# Patient Record
Sex: Female | Born: 1935 | Race: White | Hispanic: No | Smoking: Never smoker
Health system: Southern US, Community
[De-identification: ages and names within clinical notes are randomized; demographics above are authoritative.]

## PROBLEM LIST (undated history)

## (undated) DIAGNOSIS — I509 Heart failure, unspecified: Secondary | ICD-10-CM

## (undated) DIAGNOSIS — G629 Polyneuropathy, unspecified: Secondary | ICD-10-CM

## (undated) DIAGNOSIS — E079 Disorder of thyroid, unspecified: Secondary | ICD-10-CM

## (undated) DIAGNOSIS — E039 Hypothyroidism, unspecified: Secondary | ICD-10-CM

## (undated) DIAGNOSIS — J449 Chronic obstructive pulmonary disease, unspecified: Secondary | ICD-10-CM

## (undated) DIAGNOSIS — N179 Acute kidney failure, unspecified: Secondary | ICD-10-CM

## (undated) DIAGNOSIS — R0600 Dyspnea, unspecified: Secondary | ICD-10-CM

## (undated) DIAGNOSIS — Z95810 Presence of automatic (implantable) cardiac defibrillator: Secondary | ICD-10-CM

## (undated) DIAGNOSIS — I429 Cardiomyopathy, unspecified: Secondary | ICD-10-CM

## (undated) DIAGNOSIS — C801 Malignant (primary) neoplasm, unspecified: Secondary | ICD-10-CM

## (undated) DIAGNOSIS — I10 Essential (primary) hypertension: Secondary | ICD-10-CM

## (undated) HISTORY — DX: Essential (primary) hypertension: I10

## (undated) HISTORY — PX: CHOLECYSTECTOMY: SHX55

## (undated) HISTORY — DX: Presence of automatic (implantable) cardiac defibrillator: Z95.810

## (undated) HISTORY — DX: Cardiomyopathy, unspecified: I42.9

## (undated) HISTORY — PX: APPENDECTOMY: SHX54

## (undated) HISTORY — DX: Polyneuropathy, unspecified: G62.9

## (undated) HISTORY — DX: Heart failure, unspecified: I50.9

---

## 1990-09-27 HISTORY — PX: CARDIAC CATHETERIZATION: SHX172

## 1997-10-08 ENCOUNTER — Ambulatory Visit (HOSPITAL_COMMUNITY): Admission: RE | Admit: 1997-10-08 | Discharge: 1997-10-08 | Payer: Self-pay | Admitting: Cardiology

## 1998-12-05 ENCOUNTER — Other Ambulatory Visit: Admission: RE | Admit: 1998-12-05 | Discharge: 1998-12-05 | Payer: Self-pay | Admitting: Family Medicine

## 1998-12-09 ENCOUNTER — Encounter: Payer: Self-pay | Admitting: Family Medicine

## 1998-12-09 ENCOUNTER — Ambulatory Visit (HOSPITAL_COMMUNITY): Admission: RE | Admit: 1998-12-09 | Discharge: 1998-12-09 | Payer: Self-pay | Admitting: Family Medicine

## 1998-12-24 ENCOUNTER — Ambulatory Visit: Admission: RE | Admit: 1998-12-24 | Discharge: 1998-12-24 | Payer: Self-pay | Admitting: Gynecologic Oncology

## 1998-12-26 ENCOUNTER — Encounter: Payer: Self-pay | Admitting: Gynecology

## 1998-12-28 HISTORY — PX: ABDOMINAL HYSTERECTOMY: SHX81

## 1998-12-30 ENCOUNTER — Inpatient Hospital Stay (HOSPITAL_COMMUNITY): Admission: RE | Admit: 1998-12-30 | Discharge: 1999-01-07 | Payer: Self-pay | Admitting: Gynecology

## 1998-12-30 ENCOUNTER — Encounter (INDEPENDENT_AMBULATORY_CARE_PROVIDER_SITE_OTHER): Payer: Self-pay

## 1998-12-30 ENCOUNTER — Encounter (INDEPENDENT_AMBULATORY_CARE_PROVIDER_SITE_OTHER): Payer: Self-pay | Admitting: Specialist

## 1999-01-02 ENCOUNTER — Encounter: Payer: Self-pay | Admitting: Obstetrics & Gynecology

## 1999-01-20 ENCOUNTER — Encounter (INDEPENDENT_AMBULATORY_CARE_PROVIDER_SITE_OTHER): Payer: Self-pay

## 1999-01-20 ENCOUNTER — Ambulatory Visit: Admission: RE | Admit: 1999-01-20 | Discharge: 1999-01-20 | Payer: Self-pay | Admitting: Gynecology

## 1999-01-27 ENCOUNTER — Encounter (INDEPENDENT_AMBULATORY_CARE_PROVIDER_SITE_OTHER): Payer: Self-pay

## 1999-01-27 ENCOUNTER — Ambulatory Visit: Admission: RE | Admit: 1999-01-27 | Discharge: 1999-01-27 | Payer: Self-pay | Admitting: Gynecology

## 1999-02-04 ENCOUNTER — Encounter: Payer: Self-pay | Admitting: Oncology

## 1999-02-04 ENCOUNTER — Ambulatory Visit (HOSPITAL_COMMUNITY): Admission: RE | Admit: 1999-02-04 | Discharge: 1999-02-04 | Payer: Self-pay | Admitting: Oncology

## 1999-02-10 ENCOUNTER — Encounter: Payer: Self-pay | Admitting: Oncology

## 1999-02-10 ENCOUNTER — Encounter: Admission: RE | Admit: 1999-02-10 | Discharge: 1999-02-10 | Payer: Self-pay | Admitting: Oncology

## 1999-02-18 ENCOUNTER — Ambulatory Visit: Admission: RE | Admit: 1999-02-18 | Discharge: 1999-02-18 | Payer: Self-pay | Admitting: Gynecology

## 1999-04-14 ENCOUNTER — Ambulatory Visit: Admission: RE | Admit: 1999-04-14 | Discharge: 1999-04-14 | Payer: Self-pay | Admitting: Gynecology

## 1999-06-16 ENCOUNTER — Ambulatory Visit (HOSPITAL_COMMUNITY): Admission: RE | Admit: 1999-06-16 | Discharge: 1999-06-16 | Payer: Self-pay | Admitting: Oncology

## 1999-06-16 ENCOUNTER — Encounter: Payer: Self-pay | Admitting: Oncology

## 1999-08-12 ENCOUNTER — Ambulatory Visit: Admission: RE | Admit: 1999-08-12 | Discharge: 1999-08-12 | Payer: Self-pay | Admitting: Gynecology

## 2000-02-16 ENCOUNTER — Ambulatory Visit: Admission: RE | Admit: 2000-02-16 | Discharge: 2000-02-16 | Payer: Self-pay | Admitting: Gynecology

## 2000-07-27 ENCOUNTER — Ambulatory Visit: Admission: RE | Admit: 2000-07-27 | Discharge: 2000-07-27 | Payer: Self-pay | Admitting: Gynecology

## 2001-02-01 ENCOUNTER — Ambulatory Visit: Admission: RE | Admit: 2001-02-01 | Discharge: 2001-02-01 | Payer: Self-pay | Admitting: Gynecology

## 2001-08-01 ENCOUNTER — Ambulatory Visit: Admission: RE | Admit: 2001-08-01 | Discharge: 2001-08-01 | Payer: Self-pay | Admitting: Gynecology

## 2001-11-16 ENCOUNTER — Encounter: Payer: Self-pay | Admitting: Oncology

## 2001-11-16 ENCOUNTER — Ambulatory Visit (HOSPITAL_COMMUNITY): Admission: RE | Admit: 2001-11-16 | Discharge: 2001-11-16 | Payer: Self-pay | Admitting: Oncology

## 2002-02-14 ENCOUNTER — Ambulatory Visit: Admission: RE | Admit: 2002-02-14 | Discharge: 2002-02-14 | Payer: Self-pay | Admitting: Gynecology

## 2002-03-08 ENCOUNTER — Encounter: Payer: Self-pay | Admitting: Gynecology

## 2002-03-08 ENCOUNTER — Ambulatory Visit (HOSPITAL_COMMUNITY): Admission: RE | Admit: 2002-03-08 | Discharge: 2002-03-08 | Payer: Self-pay | Admitting: Gynecology

## 2002-05-03 ENCOUNTER — Ambulatory Visit (HOSPITAL_COMMUNITY): Admission: RE | Admit: 2002-05-03 | Discharge: 2002-05-03 | Payer: Self-pay | Admitting: Oncology

## 2002-05-03 ENCOUNTER — Encounter: Payer: Self-pay | Admitting: Oncology

## 2003-01-30 ENCOUNTER — Ambulatory Visit: Admission: RE | Admit: 2003-01-30 | Discharge: 2003-01-30 | Payer: Self-pay | Admitting: Gynecology

## 2003-04-22 ENCOUNTER — Encounter: Admission: RE | Admit: 2003-04-22 | Discharge: 2003-04-22 | Payer: Self-pay | Admitting: Orthopaedic Surgery

## 2003-04-23 ENCOUNTER — Ambulatory Visit (HOSPITAL_COMMUNITY): Admission: RE | Admit: 2003-04-23 | Discharge: 2003-04-23 | Payer: Self-pay | Admitting: Orthopaedic Surgery

## 2003-04-23 ENCOUNTER — Ambulatory Visit (HOSPITAL_BASED_OUTPATIENT_CLINIC_OR_DEPARTMENT_OTHER): Admission: RE | Admit: 2003-04-23 | Discharge: 2003-04-23 | Payer: Self-pay | Admitting: Orthopaedic Surgery

## 2004-01-29 ENCOUNTER — Ambulatory Visit: Admission: RE | Admit: 2004-01-29 | Discharge: 2004-01-29 | Payer: Self-pay | Admitting: Gynecology

## 2005-06-09 ENCOUNTER — Ambulatory Visit (HOSPITAL_COMMUNITY): Admission: RE | Admit: 2005-06-09 | Discharge: 2005-06-09 | Payer: Self-pay | Admitting: Gynecology

## 2005-06-11 ENCOUNTER — Ambulatory Visit: Admission: RE | Admit: 2005-06-11 | Discharge: 2005-06-11 | Payer: Self-pay | Admitting: Gynecology

## 2007-03-09 ENCOUNTER — Emergency Department (HOSPITAL_COMMUNITY): Admission: EM | Admit: 2007-03-09 | Discharge: 2007-03-09 | Payer: Self-pay | Admitting: Emergency Medicine

## 2007-06-05 HISTORY — PX: NM MYOCAR PERF WALL MOTION: HXRAD629

## 2007-06-20 ENCOUNTER — Ambulatory Visit: Payer: Self-pay | Admitting: Internal Medicine

## 2007-06-20 LAB — CONVERTED CEMR LAB
Basophils Absolute: 0.1 10*3/uL (ref 0.0–0.1)
Basophils Relative: 1.4 % — ABNORMAL HIGH (ref 0.0–1.0)
CO2: 33 meq/L — ABNORMAL HIGH (ref 19–32)
Calcium: 10.5 mg/dL (ref 8.4–10.5)
Chloride: 96 meq/L (ref 96–112)
Creatinine, Ser: 0.8 mg/dL (ref 0.4–1.2)
Eosinophils Relative: 5 % (ref 0.0–5.0)
Glucose, Bld: 121 mg/dL — ABNORMAL HIGH (ref 70–99)
Hemoglobin: 13.7 g/dL (ref 12.0–15.0)
INR: 0.9 (ref 0.8–1.0)
Lymphocytes Relative: 29.7 % (ref 12.0–46.0)
Monocytes Relative: 8.4 % (ref 3.0–11.0)
Neutro Abs: 4.2 10*3/uL (ref 1.4–7.7)
Neutrophils Relative %: 55.5 % (ref 43.0–77.0)
Prothrombin Time: 11.5 s (ref 10.9–13.3)
RBC: 4.33 M/uL (ref 3.87–5.11)
WBC: 7.6 10*3/uL (ref 4.5–10.5)
aPTT: 30.4 s — ABNORMAL HIGH (ref 21.7–29.8)

## 2007-06-27 ENCOUNTER — Ambulatory Visit: Payer: Self-pay | Admitting: Internal Medicine

## 2007-06-27 ENCOUNTER — Inpatient Hospital Stay (HOSPITAL_COMMUNITY): Admission: AD | Admit: 2007-06-27 | Discharge: 2007-06-29 | Payer: Self-pay | Admitting: Internal Medicine

## 2007-06-27 DIAGNOSIS — Z95810 Presence of automatic (implantable) cardiac defibrillator: Secondary | ICD-10-CM

## 2007-06-27 HISTORY — PX: CARDIAC DEFIBRILLATOR PLACEMENT: SHX171

## 2007-06-27 HISTORY — DX: Presence of automatic (implantable) cardiac defibrillator: Z95.810

## 2007-07-13 ENCOUNTER — Ambulatory Visit: Payer: Self-pay | Admitting: Internal Medicine

## 2007-07-13 ENCOUNTER — Ambulatory Visit: Payer: Self-pay

## 2007-07-27 ENCOUNTER — Ambulatory Visit: Payer: Self-pay | Admitting: Internal Medicine

## 2007-08-17 ENCOUNTER — Ambulatory Visit: Payer: Self-pay

## 2007-08-17 ENCOUNTER — Encounter: Payer: Self-pay | Admitting: Internal Medicine

## 2007-08-29 ENCOUNTER — Ambulatory Visit: Payer: Self-pay | Admitting: Internal Medicine

## 2007-08-29 LAB — CONVERTED CEMR LAB
BUN: 21 mg/dL (ref 6–23)
Calcium: 10.1 mg/dL (ref 8.4–10.5)
Eosinophils Absolute: 0.3 10*3/uL (ref 0.0–0.7)
Eosinophils Relative: 4.9 % (ref 0.0–5.0)
GFR calc Af Amer: 78 mL/min
GFR calc non Af Amer: 65 mL/min
Glucose, Bld: 127 mg/dL — ABNORMAL HIGH (ref 70–99)
HCT: 38.3 % (ref 36.0–46.0)
Hemoglobin: 13.3 g/dL (ref 12.0–15.0)
MCV: 95.8 fL (ref 78.0–100.0)
Monocytes Absolute: 0.5 10*3/uL (ref 0.1–1.0)
Monocytes Relative: 8.7 % (ref 3.0–12.0)
Neutro Abs: 3.5 10*3/uL (ref 1.4–7.7)
Platelets: 185 10*3/uL (ref 150–400)
RDW: 11.6 % (ref 11.5–14.6)
Sodium: 138 meq/L (ref 135–145)

## 2007-10-27 ENCOUNTER — Ambulatory Visit: Payer: Self-pay | Admitting: Internal Medicine

## 2008-01-29 ENCOUNTER — Ambulatory Visit: Payer: Self-pay

## 2008-08-13 ENCOUNTER — Encounter: Payer: Self-pay | Admitting: Internal Medicine

## 2009-02-12 ENCOUNTER — Encounter (INDEPENDENT_AMBULATORY_CARE_PROVIDER_SITE_OTHER): Payer: Self-pay | Admitting: *Deleted

## 2009-04-15 ENCOUNTER — Encounter: Admission: RE | Admit: 2009-04-15 | Discharge: 2009-04-15 | Payer: Self-pay | Admitting: Specialist

## 2009-06-16 ENCOUNTER — Inpatient Hospital Stay (HOSPITAL_COMMUNITY): Admission: RE | Admit: 2009-06-16 | Discharge: 2009-06-18 | Payer: Self-pay | Admitting: Specialist

## 2009-12-26 ENCOUNTER — Encounter (INDEPENDENT_AMBULATORY_CARE_PROVIDER_SITE_OTHER): Payer: Self-pay | Admitting: *Deleted

## 2010-01-09 ENCOUNTER — Encounter: Payer: Self-pay | Admitting: Internal Medicine

## 2010-01-12 ENCOUNTER — Telehealth (INDEPENDENT_AMBULATORY_CARE_PROVIDER_SITE_OTHER): Payer: Self-pay | Admitting: *Deleted

## 2010-04-28 NOTE — Cardiovascular Report (Signed)
Summary: Certified Letter Signed - Other (f/u w. Dr. Clarene Duke)  Certified Letter Signed - Other (f/u w. Dr. Clarene Duke)   Imported By: Debby Freiberg 02/03/2010 13:10:09  _____________________________________________________________________  External Attachment:    Type:   Image     Comment:   External Document

## 2010-04-28 NOTE — Letter (Signed)
Summary: Device-Delinquent Check  [facility], Main Office  [address]   [place], [place] [address]   Phone: [phone]  Fax: [phone]     December 26, 2009 MRN: [id number]   [facility] [address] [place], [place]  [address]   Dear Ms. Brunn,  According to our records, you have not had your implanted device checked in the recommended period of time.  We are unable to determine appropriate device function without checking your device on a regular basis.  Please call our office to schedule an appointment  with Dr Graciela Husbands, as soon as possible.  If you are having your device checked by another physician, please call us so that we may update our records.  Thank you,  Letta Moynahan, EMT  December 26, 2009 2:45 PM  [facility] certified

## 2010-04-28 NOTE — Progress Notes (Signed)
  Phone Note Call from Patient   Summary of Call: per pt---pt is followed w/dr Little for device. [name]  January 12, 2010 12:33 PM

## 2010-06-22 LAB — BASIC METABOLIC PANEL
BUN: 15 mg/dL (ref 6–23)
CO2: 28 mEq/L (ref 19–32)
Calcium: 9.6 mg/dL (ref 8.4–10.5)
Chloride: 95 mEq/L — ABNORMAL LOW (ref 96–112)
Creatinine, Ser: 1.01 mg/dL (ref 0.4–1.2)
GFR calc non Af Amer: 53 mL/min — ABNORMAL LOW (ref >60)
GFR calc non Af Amer: >60 mL/min (ref >60)
Glucose, Bld: 144 mg/dL — ABNORMAL HIGH (ref 70–99)
Glucose, Bld: 162 mg/dL — ABNORMAL HIGH (ref 70–99)
Potassium: 3.6 mEq/L (ref 3.5–5.1)
Sodium: 131 mEq/L — ABNORMAL LOW (ref 135–145)

## 2010-06-22 LAB — CBC
Hemoglobin: 13.1 g/dL (ref 12.0–15.0)
Platelets: DECREASED 10*3/uL (ref 150–400)
RBC: 3.97 MIL/uL (ref 3.87–5.11)
RDW: 13.2 % (ref 11.5–15.5)
RDW: 13.4 % (ref 11.5–15.5)
WBC: 5.8 10*3/uL (ref 4.0–10.5)
WBC: 7.2 10*3/uL (ref 4.0–10.5)

## 2010-08-11 NOTE — Letter (Signed)
October 27, 2007    Thereasa Solo. Little, M.D.  [address]  [place], [place] [address]   RE:  [name], [name]  MRN:  [id number]  /  DOB:  Dec 24, 1928   Dear Mindi Junker:   [name] came in today with her husband.  She continues to complain  of fatigue.  Interestingly, her AV optimization echo was associated with  normal left ventricular function.   On examination today, her blood pressure was 106/60, her pulse was 72,  her lungs were clear, her heart sounds were regular.  Extremities were  without edema.  The protrusion at her device pocket is the wing on her  Medtronic lead anchoring sleeve.  The tissue moves across the wing  easily without retraction.   The movement of the tissue across the wing tip makes it unlikely that  that should erode.  If it were to become attached or tethered that  likelihood goes up strikingly.  It is, however, concerning and it is  potentially susceptible to trauma.  We discussed reviewing it again in a  couple of months and thinking about making a small 1-inch incision above  there to cut the wing off.   I did relay to her the results of her echo, and I have asked them to fax  you a copy of that.   It was a pleasant surprise to [name] all.    Sincerely,      Duke Salvia, MD, [facility]  Electronically Signed    SCK/MedQ  DD: 10/27/2007  DT: 10/28/2007  Job #: [id number]

## 2010-08-11 NOTE — Letter (Signed)
June 20, 2007    Thereasa Solo. Little, M.D.  [address]  [address]  [place], [place] [address]   RE:  Katie, [name]  MRN:  [id number]  /  DOB:  [birth year]   Dear [name]:   It was a pleasure to see [name] today at your request for  consideration of cardiac resynchronization therapy.   As you know, she is a 77 year old with a long-standing history of  diagnosed nonischemic cardiomyopathy who has a recent Myoview scan, the  results of which I presume are normal as related to perfusion for which  I do not yet have the information.  The Myoview scan was without  ischemia with an ejection fraction of 27%.  She has had persistent  documentation of left ventricular dysfunction in the 20% to 30% range.  She is markedly limited by fatigue as well as moderate breathlessness.  She has some peripheral edema, but no nocturnal dyspnea or orthopnea.  She has had no syncope.  She does not have a history of snoring.  Her  fatigue has persisted despite best efforts at medical therapy.   She did not have palpitations.  She does have treated hypothyroidism.   Her past medical history, in addition to the above, is notable for:  1. Resolved reflux disease.  2. Ovarian cancer, 2003.  3. 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  4. Recent anaphylaxis attributed to blueberries.   REVIEW OF SYSTEMS:  In addition to the above, is broadly negative across  multiple organ systems.   SOCIAL HISTORY:  She is married, she has 2 children.  She does not use  cigarettes, alcohol or recreational drugs.  She does drink caffeine, 2  to 3 cups a day.   She is a Futures trader.   PAST SURGICAL HISTORY:  In addition to the above, is notable for:  1. Tonsillectomy.  2. Appendectomy x2.  3. Cholecystectomy.  4. Ovarian cancer surgery for which she had an open laparotomy.   MEDICATIONS:  1. Levothyroxine at 175 mcg a day.  2. Carvedilol 12.5 b.i.d.  3. Digoxin 0.25 daily.  4.  Cozaar 100 mg a day.  5. Furosemide 80.  6. Aspirin.   She has no known drug allergies.   PHYSICAL EXAMINATION:  On examination, she is an elderly, Caucasian  female appearing her stated age of 87.  Her blood pressure is 130/72, her pulse was 75, and she was in no acute  distress.  Her HEENT exam demonstrated no icterus, no xanthoma.  Neck veins were 8 to 9 centimeters.  The carotids were brisk and full  bilaterally without bruits.  The back was without kyphosis or scoliosis.  Lungs were clear.  Heart sounds were regular with a diminished S1, a widely split S2, a 2/6  murmur heard at the left lower sternal border that did not radiate.  The abdomen was soft but protuberant.  There are active bowel sounds  without midline pulsation.  Femoral pulses were 2+, distal pulses were intact.  There was no  clubbing or cyanosis.  There is 1+ peripheral edema.  The neurological exam was grossly normal.  Her skin was warm and dry.   Electrocardiogram dated today demonstrated sinus rhythm at 75 with  intervals of 0.19/0.18/0.42, the axis was leftward and -75.   IMPRESSION:  1. Congestive heart failure, systolic, class III, manifested by      dyspnea on exertion and fatigue.  2. Nonischemic cardiomyopathy with an ejection fraction of 27% and      negative catheterizations some years ago and a recent negative      Myoview.  3. Left bundle branch block.  4. History, remotely, of ovarian cancer with secondary lower extremity      neuropathy.   [name], [name] has significant limitations in her functional status,  likely related to her cardiomyopathy.  I wonder __________ carvedilol  may be contributing to the fatigue as well, but certainly she needs her  beta blocker.   I concur with your thoughts about her entirely.  She certainly is a  potentially good candidate for resynchronization therapy with her  nonischemic myopathy and her very broad QRS.  I have told her that there  is 65% to 70%  chance that she would have a significant improvement of  one or two functional classes by cardiac resynchronization therapy.  We  have discussed potential benefits as well as potential risks, including  but not limited to inability to deploy device related infection, lead  dislodgement, diaphragmatic stimulation, and puncture of the lung and/or  heart.  She understands these risks and would like to proceed.   We also discussed the potential risks of sudden cardiac death associated  with broad QRS.  The patient is in congestive heart failure and  nonischemic myopathy.  I told her that there is a 6% to 8% absolute  mortality risk reduction over the next 3 to 5 years based on the  research trials, as well as some potential benefit associated  resynchronization therapy as suggested by the __________  data.  She  would like to proceed with implantable cardioverter defibrillator  implantation at the time of her resynchronization therapy.   We will plan to schedule this next week at her convenience.   Thanks very much for asking [name] to see her, and I will keep you abreast  of how things go.    Sincerely,      Duke Salvia, MD, [facility]  Electronically Signed    SCK/MedQ  DD: 06/20/2007  DT: 06/20/2007  Job #: [id number]   CC:    Doris Cheadle. Foy Guadalajara, M.D.

## 2010-08-11 NOTE — Discharge Summary (Signed)
[name], [name]                 ACCOUNT NO.:  [id number]   MEDICAL RECORD NO.:  [id number]          PATIENT TYPE:  INP   LOCATION:  3736                         FACILITY:  [facility]   PHYSICIAN:  Maple Mirza, PA   DATE OF BIRTH:  05-17-1928   DATE OF ADMISSION:  06/27/2007  DATE OF DISCHARGE:  06/29/2007                               DISCHARGE SUMMARY   This patient has anaphylaxis with blueberries.   Dictation and examination greater than 40 minutes.   FINAL DIAGNOSES:  1. Discharge day 2, status post implant of a Medtronic Concerto      cardioverter-defibrillator with left ventricular resynchronization      lead, Dr. Sherryl Manges.  2. Discharge day 1, status post revision of left ventricular lead.  3. QRS has been decreased from pre-procedure timing of 176 msec to 144      msec postprocedure.    Special note for ICD clinic, Dr. Graciela Husbands requests reprogramming Concerto  device to unipolar mode from left ventricular coil to right ventricular  chip at followup visit Thursday July 13, 2007, at 9 o'clock at [facility].   SECONDARY DIAGNOSES:  1. New York Heart Association, class III, chronic systolic congestive      heart failure.  2. Nonischemic cardiomyopathy, ejection fraction of 25% to 30%.  3. Left bundle branch block.  4. History of ovarian cancer with neuropathy in the lower extremities      secondary to chemotherapy.  5. Treated hypothyroidism.   PROCEDURES:  1. June 27, 2007, implantation of a Medtronic Concerto CRT-D system      with defibrillator threshold study less than or equal to 20 joules.  2. June 28, 2007, a left ventricular lead revision for dislodgement,      Dr. Lewayne Bunting.  As mentioned above, the cardiac      resynchronization has decreased the QRS time from 176 msec to 144      msec.   BRIEF HISTORY:  [name] is a 77 year old female.  She has a  longstanding history of nonischemic cardiomyopathy.  She has a depressed  ejection  fraction in the 20s.  This has been persistent despite medical  therapy.   She has left bundle branch block, and she has class III congestive heart  failure.  Her activity is markedly limited by fatigue as well as  breathlessness.  She does have some peripheral edema but no nocturnal  dyspnea or orthopnea.  She has had no history of syncope.  Her fatigue  persists despite best efforts of medical therapy.  The patient referred  to Dr. Sherryl Manges from Dr. Clarene Duke for Bi-V ICD.  The patient  qualifies.  Risks, benefits, and expectations have been described.  The  patient wishes to proceed.   HOSPITAL COURSE:  The patient presents electively on June 27, 2007.  She underwent implantation of the cardioverter defibrillator with left  ventricular lead by Dr. Sherryl Manges.  Postprocedure day #1, the chest x-  ray and interrogation showed that the left ventricular lead had become  dislodged.  She was then revisited in  the electrophysiology lab by Dr.  Ladona Ridgel on June 28, 2007, with repositioning of her left ventricular  lead.  This has been successful and on postprocedure day #1, of the  repositioning she will discharge.  Her leads are in the appropriate  position.  Dr. Graciela Husbands wishes to have the device reprogrammed from an  unipolar mode from left ventricular coil to right ventricular chip.  This will be done at the first visit to [facility] for incision  check at the ICD clinic, Thursday, July 13, 2007.  Mobility of the left  arm and incision care have been discussed with the patient.  She is  asked to keep her incision dry for the next 7 days and to sponge bathe  until Wednesday, July 05, 2007.  She has information booklet and ID card  from [employer].   MEDICATIONS AT DISCHARGE:  1. Levothyroxine 175 mcg daily.  2. Carvedilol 12.5 mg twice daily.  3. Digoxin 0.25 mg daily.  4. Cozaar 100 mg daily.  5. Furosemide 80 mg daily.  6. Enteric-coated aspirin 81 mg daily.  7. Oxycodone  5/325 as needed.   She has followup appointments.  1. ICD Clinic at [facility] on Thursday, July 13, 2007, at 9      o'clock.  She will have a basic metabolic panel and reprogramming      of the device.  2. She sees Dr. Clarene Duke in the week of July 24, 2007.  His office will      call with that appointment.  3. She sees Dr. Graciela Husbands on July 27, 2007, at 2 o'clock.  4. She sees Dr. Graciela Husbands, Thursday, September 28, 2007, at 10:40.   LABORATORY STUDIES:  Pertinent to this admission drawn on June 20, 2007; white cells 7.6, hemoglobin 13.7, hematocrit 41.4, and platelets  225.  Pro time 11.5 and INR 0.9.  Sodium is 137, potassium 4.3, chloride  96, carbonate 33, glucose 121, BUN is 21, and creatinine is 0.8.      Maple Mirza, PA     GM/MEDQ  D:  06/29/2007  T:  06/30/2007  Job:  [id number]   cc:   Duke Salvia, MD, [facility]  Doris Cheadle. Foy Guadalajara, M.D.  Thereasa Solo. Little, M.D.

## 2010-08-11 NOTE — Op Note (Signed)
[name], [name]                 ACCOUNT NO.:  [id number]   MEDICAL RECORD NO.:  [id number]          PATIENT TYPE:  INP   LOCATION:  3736                         FACILITY:  [facility]   PHYSICIAN:  Doylene Canning. Ladona Ridgel, MD    DATE OF BIRTH:  Oct 06, 1928   DATE OF PROCEDURE:  06/28/2007  DATE OF DISCHARGE:                               OPERATIVE REPORT   PROCEDURE PERFORMED:  Left ventricular lead revision with defibrillation  testing.   INTRODUCTION:  The patient is a 78-year woman with nonischemic  cardiomyopathy, congestive heart failure, and left bundle branch block  who was admitted to the hospital and underwent insertion of a  biventricular ICD.  The day postoperatively, the patient was found to  have dislodgement of the LV lead, and it had actually retracted back  into the right atrium.  There was no LV catheter.  She is now referred  for LV lead revision.   PROCEDURE:  After informed consent was obtained, the patient was taken  to the diagnostic EP lab in fasting state.  After usual preparation and  draping, intravenous fentanyl and midazolam was given for sedation.  Thirty mL of lidocaine was infiltrated in the left infraclavicular  region.  A 7-cm incision was carried out over the old ICD insertion site  and carried out down to the fascial plane.  The LV lead sutures were  removed from the LV lead itself, and the LV lead was removed from the  device.  The atrial and RV leads were also removed, and the device was  placed in kanamycin.  At this point, the attention was turned to the  reposition of the LV lead.  Unfortunately, the fixation mechanism on the  StarFix pacing lead would not retract back to the neutral state, and for  this reason, the use of this lead was abandoned.  An angioplasty  guidewire was advanced through the lead into the right atrium.  The lead  was backed out from this and out of the body.  At this point, the  Medtronic MB II guiding catheter was advanced into  the coronary sinus,  and the lead was subselected into the posterior vein.  The guiding  catheter was withdrawn slightly, and the EP catheter was advanced into  the guiding catheter and managed to cannulate the coronary sinus proper.  Venography of the coronary sinus was carried out.  There was a very  shallow posterior vein.  There was a posterolateral vein and a lateral  vein.  Initially, because the previous lateral vein had been used, it  was decided to try the posterolateral vein which was a fairly large vein  with a very extensive shepherd's crook in the vein itself.  The vein was  successfully cannulated with several different guiding guidewires.  The  subselected catheter was removed from the guidewire, and the bipolar LV  pacing lead was advanced over the guidewire out into the lateral vein.  Unfortunately, as the lead was being advanced around the shepherd's  crook, the angioplasty guidewire dislodged and the lead itself  dislodged.  Several additional  attempts to cannulate and advance the LV  lead into this vein were unsuccessful.  At this point, the lateral vein  which had previously been chosen for LV lead placement was reutilized.  The angioplasty guidewire was advanced into this vein without  difficulty, and the bipolar Medtronic LV pacing lead serial number  [id number] was advanced into the lateral vein a distance of about two-  thirds from base to apex.  In this location, the R waves were 12 volts,  the pacing threshold was 0.6 volts at 0.5 milliseconds, and 10-volt  pacing did not stimulate diaphragm.  With these satisfactory parameters,  the lead was liberated from its guiding catheter in the usual fashion  and secured to subpectoralis fascia with a figure-of-eight silk suture.  The sewing sleeve was also secured with silk suture.  Electrocautery was  utilized to obtain hemostasis, and the pocket was irrigated with  kanamycin.  The previously utilized Medtronic Concerto  biventricular ICD  serial number [id number] was connected to the newly placed LV lead and  the old RV and RA right atrial lead and placed back in the subcutaneous  pocket.  The generator was secured to the pocket with silk suture, and  additional kanamycin was utilized to irrigate the pocket, and  defibrillation threshold testing carried out.   After the patient was more deeply sedated with fentanyl and Versed, VF  was induced with T-wave shock, and a 15 joules shock was delivered which  terminated VF and restored sinus rhythm.  At this point, no additional  defibrillation threshold testing was carried out, and the incision  closed with a layer of 2-0 Vicryl followed by layer of 3-0 Vicryl.  Benzoin was painted on the skin, Steri-Strips were applied, and a  pressure dressing was placed, and the patient was returned to her room  in satisfactory condition.   COMPLICATIONS:  There were no immediate complications.   RESULTS:  Demonstrate successful LV lead revision along with  defibrillation threshold testing in a patient with nonischemic  cardiomyopathy and congestive heart failure.      Doylene Canning. Ladona Ridgel, MD  Electronically Signed     GWT/MEDQ  D:  06/28/2007  T:  06/29/2007  Job:  [id number]   cc:   Duke Salvia, MD, [facility]  Thereasa Solo. Little, M.D.

## 2010-08-11 NOTE — Letter (Signed)
August 29, 2007    Thereasa Solo. Little, M.D.  [address]  [address]  [place], [place] [address]   RE:  DEBBE, CRUMBLE  MRN:  [id number]  /  DOB:  [birth year]   Dear Mindi Junker:   Mrs. Riggins comes in unfortunately not feeling much better following her  CRT implantation, in fact may be feeling a little bit worse.  She had  undergone AV optimization a couple of weeks ago without any significant  improvement.   She has also asked whether the Coreg which was increased just prior to  the procedure might be contributing to either her fatigue as well as the  fact that she has now developed severe epigastric pain associated with a  couple episodes of bright red blood per rectum.  She does not have an  antecedent history of ulcer disease.   MEDICATIONS:  1. Levothyroxine 175.  2. Digoxin 0.25.  3. Cozaar 100.  4. Furosemide 80.  5. Aspirin 81.  6. Coreg 12.5 b.i.d.  7. Alendronate.   PHYSICAL EXAMINATION:  VITAL SIGNS:  Her blood pressure today was  elevated at 159/82 with a pulse of 51.  LUNGS:  Clear.  HEART:  Sounds were regular with a 2/6 murmur.  NECK:  Veins were flat.  HEENT:  The eyelids were a little bit pale.  EXTREMITIES:  Edema 1+.   Interrogation of her Medtronic Concerta ICD demonstrates an R-wave of  16.2 with  impedance of 662 with threshold 0.5 to 0.4, the P-wave was  5.9 with impedance of 696, a threshold of 1.4 in the LV, impedance was  460 with threshold 1.5 at 0.4.  Battery voltage 3.21.  The device was  reprogrammed.   IMPRESSION:  1. Congestive heart failure - class III.  2. Nonischemic cardiomyopathy.  3. History of gastroesophageal reflux disease in the past with now      recurrent epigastric pain and gastrointestinal bleeding.  4. Left bundle branch block.  5. History of ovarian cancer.   Mrs. Janak, Mindi Junker, is not improved significantly following CRT  implantation notwithstanding AV optimization.  I am not altogether  sanguine at this point we are going to make her  better from this regard.   I have taken the liberty of decreasing her Coreg from 6 to 3.125 as she  feels that this was concurrent with her feeling worse.  In addition, we  will check a CBC and a BMET today.  I have a call into Dr. [name]  to facilitate evaluation of her GI bleeding.   Thanks very much for allowing [name] to participate in her care.    Sincerely,      Duke Salvia, MD, [facility]  Electronically Signed    SCK/MedQ  DD: 08/29/2007  DT: 08/29/2007  Job #: [id number]   CC:    Doris Cheadle. Foy Guadalajara, M.D.

## 2010-08-11 NOTE — Letter (Signed)
July 27, 2007    Thereasa Solo. Little, M.D.  [address]  [address]  [place], [place] [address]   RE:  COLEY, LITTLES  MRN:  [id number]  /  DOB:  04/16/1928   Dear Mindi Junker:   Mrs. Cake came in today following CRT - D implantation accomplished at  the end of March complicated by left ventricular lead dislodging and  reposition by Dr. Lewayne Bunting with good radiographic positioning on the  following day.  Unfortunately, her fatigue has persisted; she feels no  better.  Dyspnea is modest.   You have increased her carvedilol intercurrently to 25 mg b.i.d.   EXAMINATION:  Today her blood pressure is 132/68, her pulse was 64.  Her lungs were clear.  Her neck veins were flat.  Her heart sounds were regular.  EXTREMITIES:  Without edema.   IMPRESSION:  1. Congestive heart failure - chronic - stable.  2. Status post CRT-D implantation with little effect on #1.  3. Fatigue likely related to #1, question related to carvedilol.   [name], Mrs. Doi symptoms have not improved with CRT implantation.  I  think the first order of business would be to undertake an AV  optimization echo to see there is anything that could be done  programming-wise.  The other question I have for you is whether an  alternative beta  blocker like bisoprolol, for which we have the data from [employer] and is  covered, might be worth trying as an alternative to the Coreg, which may  be contributing to the fatigue.   I will defer this to your expertise.  I will let you know what the AV  optimization echo shows.    Sincerely,      Duke Salvia, MD, [facility]  Electronically Signed    SCK/MedQ  DD: 07/27/2007  DT: 07/27/2007  Job #: [id number]

## 2010-08-14 NOTE — Consult Note (Signed)
NAME:  [name], [name]                           ACCOUNT NO.:  [id number]   MEDICAL RECORD NO.:  [id number]                   PATIENT TYPE:  OUT   LOCATION:  GYN                                  FACILITY:  [facility]   PHYSICIAN:  De Blanch, M.D.         DATE OF BIRTH:  October 09, 1928   DATE OF CONSULTATION:  02/14/2002  DATE OF DISCHARGE:                                   CONSULTATION   A 77 year old white female returns for continuing follow-up of a stage IV  ovarian cancer.  She underwent initial debulking and staging in October  2000.  She initially presented with a right chest wall metastasis (stage  IV).  She subsequently received six cycles of carboplatin and Taxol  chemotherapy and has been followed since that time with no evidence of  recurrent disease.  Her most recent staging studies included a CAT scan of  the chest, abdomen, and pelvis obtained November 16, 2001 which was  essentially normal.  She also had a CA-125 on Aug 01, 2001 which was 15.   Today the patient has no complaints.  She denies any GI or GU symptoms.  She  has no pelvic pain, pressure, vaginal bleeding, or discharge.   REVIEW OF SYSTEMS:  Otherwise negative.   FAMILY HISTORY:  Reviewed and unchanged from prior notations.   SOCIAL HISTORY:  Reviewed and unchanged from prior notations.   PHYSICAL EXAMINATION:  VITAL SIGNS:  Weight 171 pounds (stable), blood  pressure 160/94.  GENERAL:  The patient is a healthy white female in no acute distress.  HEENT:  Negative.  NECK:  Supple without thyromegaly.  LYMPH:  There is no supraclavicular, axillary, or inguinal adenopathy.  CHEST:  Palpation of the right chest wall shows some thickening beneath the  scar which is unchanged.  No discreet masses are noted.  ABDOMEN:  Soft, nontender.  No mass, organomegaly, ascites, or hernias are  noted.  PELVIC:  EGBUS, vagina, bladder, urethra are normal.  Uterus and cervix  surgically absent.  Adnexa without masses.   Rectovaginal examination  confirms.   IMPRESSION:  Stage IV ovarian cancer with no evidence of recurrent disease.   PLAN:  CA-125 will be obtained today.  The patient will return to se Dr.  [name] as scheduled in three months.  Thereafter we will lengthen her  visits to six month intervals and therefore I will plan on seeing the  patient again in nine months.                                               De Blanch, M.D.    DC/MEDQ  D:  02/14/2002  T:  02/14/2002  Job:  [id number]   cc:   [name], M.D.  [address]  [place]  [place] [address]  Fax: [phone]   Molly Maduro L. Foy Guadalajara, M.D.  [address] [place]  [place] [address]  Fax: [phone]   Thereasa Solo. Little, M.D.  [address] [place]  [place] [address]  Fax: [phone]   [name], R.N.  [address] [place], [place] [address]  Fax: [phone]

## 2010-08-14 NOTE — H&P (Signed)
[facility]  Patient:    [name], [name]                          MRN: [id number] Adm. Date:  08/12/99 Attending:  Rande Brunt. Clarke-Pearson, M.D. CC:         [name], R.N.             [name], M.D.             [name], M.D.             [name], M.D.                         History and Physical  HISTORY OF PRESENT ILLNESS:  The patient is a 77 year old white female returns for continuing followup of a stage IV ovarian cancer, underwent initial debulking surgery December 30, 1998.  She was then treated with six cycles of carboplatin and Taxol chemotherapy by Dr. Ruthann Cancer.  She had an excellent response based on followup CA-125 values.  Preoperatively her value was 186 units per mL, and by the time of her second cycle of chemotherapy was 11.2.  She has recently had another CA-125 May 9 which is 12.7.  The patient reports since her last visit she has done well.  She denies any GI or GU symptoms.  Functional status is improving steadily.  Her alopecia is resolving.  She has minimal diarrhea for which she takes Imodium p.r.n., although she has not had any diarrhea in the last several days.  She denies any fevers or chills.  REVIEW OF SYSTEMS:  Reveals no cardiovascular, pulmonary, or neurologic symptoms.  PHYSICAL EXAMINATION:  VITAL SIGNS:  Weight 150 pounds (up 4 pounds since January).  HEENT:  Negative.  NECK:  Supple without thyromegaly.  There is no supraclavicular or inguinal adenopathy.  ABDOMEN:  Soft, nontender.  No mass, organomegaly, ascites, or hernias are noted.  There are no nodules in the inguinal region or mons.  PELVIC:  EG/BUS normal.  Vagina is clean, well supported.  No lesions noted. Bimanual or vaginal exam revealed no mass, induration or nodularity.  IMPRESSION:  Stage IV ovarian cancer.  No evidence of recurrent disease.  CA-125 is reviewed with the patient along with her blood  counts, all of which are normal.  She is given the okay to return to full levels of activity if she feels up to it.  She will return to see Dr. [name] in three months and return to see [name] in six months. DD:  08/12/99 TD:  08/12/99 Job: [id number]

## 2010-08-14 NOTE — Consult Note (Signed)
NAME:  [name], [name]                           ACCOUNT NO.:  [id number]   MEDICAL RECORD NO.:  [id number]                   PATIENT TYPE:  OUT   LOCATION:  GYN                                  [facility]ACILITY:  [facility]   PHYSICIAN:  De Blanch, M.D.         DATE OF BIRTH:  03/10/1929   DATE OF CONSULTATION:  01/30/2003  DATE OF DISCHARGE:                                   CONSULTATION   A 77 year old white female returns for continuing follow-up of a stage IV  ovarian cancer.   INTERVAL HISTORY:  Since her last visit the patient has done well.  She  denies any GI or GU symptoms.  Has no pelvic pain, pressure, vaginal  bleeding or discharge.  Her functional status is excellent.  She currently  has an upper respiratory infection which is treated symptomatically.   HISTORY OF PRESENT ILLNESS:  The patient was found to have stage IV ovarian  cancer in October of 2000.  She underwent debulking and staging including  resection of a right chest wall metastasis.  She subsequently received six  cycles of carboplatin and Taxol chemotherapy and has been followed since  that time with no evidence of recurrent disease.   PAST MEDICAL HISTORY:  1. Cardiomyopathy 1997 with congestive heart failure.  2. Hypothyroidism.   CURRENT MEDICATIONS:  1. Cozaar.  2. Lanoxin.  3. Lasix.   ALLERGIES:  None.   PAST SURGICAL HISTORY:  1. Tonsillectomy.  2. Appendectomy.  3. Laparoscopic cholecystectomy.  4. Laparotomy for intestinal obstruction.  5. Ovarian cancer debulking.   REVIEW OF SYSTEMS:  Otherwise negative.   FAMILY HISTORY:  Reviewed and unchanged.   SOCIAL HISTORY:  Reviewed and unchanged.   PHYSICAL EXAMINATION:  VITAL SIGNS:  Weight 156 pounds, blood pressure  140/84.  GENERAL:  The patient is a healthy white female in no acute distress.  HEENT:  Negative.  NECK:  Supple without thyromegaly.  LYMPH:  There is no supraclavicular or inguinal adenopathy.  CHEST:  There is no  chest wall masses.  ABDOMEN:  Soft, nontender.  No mass, organomegaly, ascites, or hernias  noted.  PELVIC:  EGBUS, vagina, bladder, urethra are normal.  Cervix and uterus are  surgically absent.  Adnexa without masses.  Rectovaginal examination  confirms.   IMPRESSION:  Stage IV ovarian cancer now with four years of follow-up, no  evidence of recurrent disease.   Laboratory work:  The patient had a CA-125 obtained on October 28 which was  18.  This is slightly lower than a prior value of 22 units/mL.   The patient will return to see [name], M.D. in March and return  to see me in one year.  De Blanch, M.D.    DC/MEDQ  D:  01/30/2003  T:  01/30/2003  Job:  [id number]   cc:   [name], M.D.  [address] [facility]  [place]  [place] [address]  Fax: [phone]   Doris Cheadle. Foy Guadalajara, M.D.  [address] [place]  [place] [address]  Fax: [phone]   Thereasa Solo. Little, M.D.  [address]  [place] [address]  Fax: [phone]   [name], R.N.  [phone] [address]  [place], [place] [address]

## 2010-08-14 NOTE — Consult Note (Signed)
NAMETAGEN, BRETHAUER                 ACCOUNT NO.:  [id number]   MEDICAL RECORD NO.:  [id number]          PATIENT TYPE:  OUT   LOCATION:  GYN                          FACILITY:  [facility]   PHYSICIAN:  De Blanch, M.D.DATE OF BIRTH:  1928-07-10   DATE OF CONSULTATION:  01/29/2004  DATE OF DISCHARGE:                                   CONSULTATION   A 77 year old white female returns for continuing follow-up of stage IV  ovarian cancer.   INTERVAL HISTORY:  Since her last visit, the patient has done well.  She saw  Dr. [name] for a routine check-up.  She subsequently had mammograms and  bone density studies by her primary physician.   From the point of view of ovarian cancer, she denies any GI or GU symptoms,  has no pelvic pain, pressure, vaginal bleeding, or discharge.  She has not  noted any new chest wall or axillary adenopathy.   HISTORY OF PRESENT ILLNESS:  The patient was found to have stage IV ovarian  cancer in October 2002.  This included a metastasis to the right chest wall  which was excised.  She underwent intraperitoneal debulking and received 6  cycles of carboplatin and Taxol chemotherapy.   PAST MEDICAL HISTORY:  Cardiomyopathy in 1997 with congestive heart failure,  hypothyroidism.   CURRENT MEDICATIONS:  Cozaar, Lanoxin, and Lasix.   DRUG ALLERGIES:  None.   PAST SURGICAL HISTORY:  1.  Tonsillectomy.  2.  Appendectomy.  3.  Laparoscopic cholecystectomy.  4.  Laparotomy for intestinal obstruction.  5.  Ovarian cancer debulking.  6.  Excision of chest wall metastasis.   REVIEW OF SYSTEMS:  Negative.   FAMILY HISTORY:  Reviewed and unchanged.   SOCIAL HISTORY:  Reviewed and unchanged.   PHYSICAL EXAMINATION:  VITAL SIGNS:  Weight 161 pounds, blood pressure  160/84.  GENERAL:  The patient is a healthy white female in no acute distress.  HEENT:  Negative.  NECK:  Supple without thyromegaly.  There is no supraclavicular or inguinal  adenopathy.  CHEST:  Without masses, nodules, or skin changes.  ABDOMEN:  Soft, nontender.  No mass, organomegaly, ascites, or hernias are  noted.  Midline incision is well-healed.  PELVIC:  EG//BUS, vagina, bladder, urethra are normal.  Cervix and uterus  surgically absent.  Adnexa without masses.  Rectovaginal exam confirms.   IMPRESSION:  Stage IV ovarian cancer, now with 5 years of follow up.   The patient's recent CA 125 was 18 which remains stable.   PLAN:  We will have the patient return in 1 year for continuing  surveillance.     Dani   DC/MEDQ  D:  01/29/2004  T:  01/29/2004  Job:  [id number]   cc:   [name], M.D.  [address] [facility]  [place]  [place] [address]  Fax: [phone]   Doris Cheadle. Foy Guadalajara, M.D.  [address] [place]  [place] [address]  Fax: [phone]   Thereasa Solo. Little, M.D.  [address]  [place]  [place] [address]  Fax: [phone]   [name], R.N.  [address]  [place], [place] [address]

## 2010-08-14 NOTE — Op Note (Signed)
NAME:  [name], [name]                           ACCOUNT NO.:  [id number]   MEDICAL RECORD NO.:  [id number]                   PATIENT TYPE:  AMB   LOCATION:  DSC                                  FACILITY:  [facility]   PHYSICIAN:  Lubertha Basque. Jerl Santos, M.D.             DATE OF BIRTH:  [birth year]   DATE OF PROCEDURE:  04/23/2003  DATE OF DISCHARGE:                                 OPERATIVE REPORT   PREOPERATIVE DIAGNOSIS:  Left distal radius fracture.   POSTOPERATIVE DIAGNOSIS:  Left distal radius fracture.   PROCEDURE:  ORIF of left distal radius fracture.   ANESTHESIA:  Block and general.   PIN INSERTION:  Dalldorf.   ASSISTANT:  Lindwood Qua, P.A.   INDICATIONS FOR PROCEDURE:  The patient is a 77 year old woman who slipped a  couple of days ago on the ice and sustained a comminuted intra-articular  distal radius fracture.  She was offered ORIF in hopes of realigning her  joint and allowing for early motion.  Informed operative consent was  obtained after discussion of possible complications of reaction to  anesthesia, infection, neurovascular injury, and nonunion.  The possibility  of degenerative arthritis was also discussed.   DESCRIPTION OF PROCEDURE:  The patient was brought to the operating suite  where a block was attempted.  This was not completely successful, and as a  result, she underwent general anesthesia.  She was positioned supine and  prepped and draped in a normal sterile fashion.  After the administration of  IV antibiotics, the left arm was elevated, exsanguinated, and a tourniquet  inflated about the upper arm.  A closed reduction was performed, and  fingertraps were applied with 10 pounds of weight.  By x-ray, we seemed to  have achieved a decent reduction in two planes.  We then made a volar  approach to the distal radius.  An incision was made from the wrist flexion  crease proximal.  Dissection was carried down to the FCR tendon.  This was  taken in a  radial direction, and the base of this tendon sheath was utilized  for exposure of the wrist.  The short muscles were then elevated off the  distal radius.  The fracture site was exposed.  Some additional reduction  was done in an open fashion.  I then placed a [facility] medical volar plate in  appropriate position and checked this by x-ray.  I used one screw in the  sliding screw hole to secure the plate to the radial shaft.  I then used  four distal screws, which were all locking screws to secure purchase into  the areas of fracture fragments.  I examined the wrist under fluoroscopy in  two planes, and it appeared that we had placed the hardware in the  appropriate position, and the fracture was well-reduced.  I then placed some  additional screws on the shaft, which were not locking  screws.  The weight  was removed, and the wrist was again examined under fluoroscopy under  motion, and the fracture was found to be quite stable.  The tourniquet was  deflated, and the fingers became pink and warm immediately.  A small amount  of bleeding was easily controlled with Bovie cautery.  The radial pulse  remained intact.  The wound was irrigated, followed by reapproximation of  subcutaneous tissues with 2-0 undyed Vicryl and the skin with nylon.  Adaptic was applied to the wound, followed by dry gauze and a volar splint  of plaster with the wrist in slight extension.  The estimated blood loss and  intraoperative fluids can be obtained from the anesthesia records, as can  accurate tourniquet time.   DISPOSITION:  The patient was extubated in the operating room and taken to  recovery room stable.  Plans were for her to go home the same day and to  follow up in the office once a week.  I will contact her by phone tonight.                                               Lubertha Basque Jerl Santos, M.D.    PGD/MEDQ  D:  04/23/2003  T:  04/23/2003  Job:  [id number]

## 2010-08-14 NOTE — Consult Note (Signed)
[facility]  Patient:    [name], [name]                        MRN: [id number] Proc. Date: 07/27/00 Adm. Date:  [id number] Attending:  [name] CC:         [name], M.D.  Thereasa Solo. Little, M.D.  [name], M.D.  [name], R.N.   Consultation Report  HISTORY OF PRESENT ILLNESS:  A 77 year old white female returns for continued follow-up of a stage IV ovarian cancer.  She underwent initial debulking and staging in October 2001 and subsequently received carboplatin/Taxol chemotherapy.  She has been followed since completing the six cycles of carboplatin and Taxol between Dr. [name] and myself.  She has had no evidence of recurrent disease.  The patient reports no GI or GU symptoms.  Apparently, she has been found to be hypothyroid and her Synthroid dose is being adjusted upward.  It is noted that her last CA 125 value was 12 units/mL in February.  Overall, the patient feels well.  She has no GI or GU symptoms.  She has no pelvic pain, pressure, vaginal bleeding, or discharge.  REVIEW OF SYSTEMS:  Otherwise negative except as noted in the history of present illness.  FAMILY HISTORY/SOCIAL HISTORY:  Reviewed and unchanged.  The patient comes accompanied by her husband.  PHYSICAL EXAMINATION:  VITAL SIGNS:  Weight 165 pounds, blood pressure 140/80.  GENERAL:  The patient is a healthy, elderly white female in no acute distress.  CHEST:  Palpation of the right axillary region and chest wall shows no lesions.  Her scar is well healed.  ABDOMEN:  Soft and nontender.  No masses, organomegaly, ascites, or hernias are noted.  PELVIC:  EGBUS normal.  The vagina is clean and well supported.  Bimanual and rectovaginal exam reveals no masses, induration, or nodularity.  EXTREMITIES:  Lower extremities without edema or varicosities.  IMPRESSION:  Stage IV ovarian cancer.  No evidence of recurrent disease. CA 125  will be obtained today.  The patient will return to see Dr. [name] in three months and return to see me in six months. DD:  07/27/00 TD:  07/28/00 Job: [id number]

## 2010-08-14 NOTE — Consult Note (Signed)
[name], AUSTILL                 ACCOUNT NO.:  [id number]   MEDICAL RECORD NO.:  [id number]          PATIENT TYPE:  OUT   LOCATION:  GYN                          [facility]ACILITY[facility]  [facility]   PHYSICIAN:  De Blanch, M.D.DATE OF BIRTH:  Jul 23, 1928   DATE OF CONSULTATION:  06/11/2005  DATE OF DISCHARGE:  06/11/2005                                   CONSULTATION   CHIEF COMPLAINT:  Ovarian cancer.   INTERVAL HISTORY:  Since her last visit the patient has done well. She  denies any GI or GU symptoms, has no pelvic pain, pressure, vaginal bleeding  or discharge. Functional status has been excellent. She has been somewhat  anxious about returning for fear of having recurrence diagnosed although she  has no basis for this concern in terms of any new symptoms.   HISTORY OF PRESENT ILLNESS:  The patient has stage IV ovarian cancer  initially diagnosed in October 2000. She had a metastasis in her chest wall  which was excised and then she underwent intraabdominal debulking.  Subsequently she received 6 cycles of carboplatin and Taxol chemotherapy and  has had no evidence of recurrent disease.   PAST MEDICAL HISTORY:  Cardiomyopathy in 1997 with congestive heart failure.  Hypothyroidism.   CURRENT MEDICATIONS:  Lanoxin, Lasix, Actonel.   ALLERGIES:  None.   PAST SURGICAL HISTORY:  Tonsils and adenoidectomy, appendectomy,  laparoscopic cholecystectomy, laparotomy for intestinal obstruction, ovarian  cancer debulking, excision of chest wall metastasis.   FAMILY HISTORY:  Negative for gynecologic, breast or colon cancer.   SOCIAL HISTORY:  The patient is widowed and does not smoke.   REVIEW OF SYSTEMS:  A 10 point comprehensive review of systems performed is  negative except as noted above.   PHYSICAL EXAMINATION:  VITAL SIGNS:  Weight 165 pounds, blood pressure  160/84, pulse 80, respiratory rate 20.  GENERAL:  The patient is a healthy white female in no acute distress.  HEENT:   Negative.  NECK:  Supple without thyromegaly.  ABDOMEN:  Soft, nontender, no mass, organomegaly, ascites or hernias are  noted. There is CVA tenderness.  PELVIC:  EGBUS, vagina, bladder, urethra are normal. Cervix and uterus  surgically absent.  Adnexa without masses. Rectovaginal exam confirms.   The patient had a CA 125 on March 14 which was 17.8 units/mL, this is stable  from prior exams.   IMPRESSION:  Stage IV ovarian cancer, clinically free of disease now with 7  years of followup.   PLAN:  The patient will return in one year for continuing followup. She will  continue to have annual CA 125 values.      De Blanch, M.D.  Electronically Signed     DC/MEDQ  D:  06/11/2005  T:  06/14/2005  Job:  [id number]   cc:   Molly Maduro L. Foy Guadalajara, M.D.  Fax: [phone]   [name], M.D.  Fax: [phone]   Thereasa Solo. Little, M.D.  Fax: [phone]   [name], R.N.  [address]  [place], [place] [address]

## 2010-08-14 NOTE — Consult Note (Signed)
[facility]  Patient:    [name], [name] Visit Number: [id number] MRN: [id number]          Service Type: GON Location: GYN Attending Physician:  [name] Dictated by:   Rande Brunt. Clarke-Pearson, M.D. Admit Date:  02/01/2001   CC:         [name], M.D.  [name], M.D.  Julieanne Manson, M.D.  [name], R.N.   Consultation Report  HISTORY OF PRESENT ILLNESS:  This 77 year old white female returns for continued follow-up with a stage IV ovarian cancer.  She underwent initial debulking and staging in October of 2000.  She had metastatic disease in a left inguinal lymph node and a right axillary lymph node.  She subsequently received six cycles of carboplatin and Taxol chemotherapy.  She subsequently has been followed with no evidence of recurrent disease.  Since her last visit, she has done well.  She had a CA125 of 13 in August when she saw [name], M.D.  PAST MEDICAL HISTORY:  Medical Illness:  The patient apparently is hypothyroid and she is having some difficulty getting the dose of her Synthroid adjusted. Otherwise she is doing well.  PHYSICAL EXAMINATION:  Weight 165 pounds.  VITAL SIGNS:  Blood pressure 136/78.  GENERAL APPEARANCE:  The patient is a healthy white female in no acute distress.  HEENT:  Negative.  NECK:  Supple without thyromegaly.  There is no supraclavicular or inguinal adenopathy.  The right axillary incision is palpated.  There is some scar beneath it, but no mass.  ABDOMEN:  Soft and nontender.  No mass, organomegaly, ascites, or hernias are noted.  EXTREMITIES:  The lower extremities are without edema or varicosities.  PELVIC:  EG and BUS normal.  The vagina is clean and well supported.  Bimanual and rectovaginal exam reveal no masses, induration, or nodularity.  IMPRESSION:  Stage IIB ovarian cancer, status post surgical resection and staging and six cycles of  carboplatin and Taxol chemotherapy.  She is clinically free of disease.  A CA125 will be obtained today.  The patient will return to see [name], M.D., in three months and will return to see me in one year. Dictated by:   Rande Brunt. Clarke-Pearson, M.D. Attending Physician:  [name] DD:  02/01/01 TD:  02/02/01 Job: [id number]

## 2010-08-14 NOTE — Consult Note (Signed)
[facility]  Patient:    [name], [name] Visit Number: [id number] MRN: [id number]          Service Type: GON Location: GYN Attending Physician:  [name] Dictated by:   Rande Brunt. Clarke-Pearson, M.D. Proc. Date: 08/01/01 Admit Date:  08/01/2001 Discharge Date: 08/01/2001   CC:         [name], M.D.  [name], M.D.  Julieanne Manson, M.D.  [name], R.N.   Consultation Report  Patient is a 77 year old white female who had a stage IV ovarian cancer undergoing initial debulking and staging in October 2000.  She subsequent received six cycles of carboplatin and Taxol and has been followed since that time with no evidence of recurrent disease.  She recently saw Dr. [name] in February and at that time was doing well.  Her CA-125 was 14.4 units/ml.  She specifically denies any GI or GU symptoms.  She has a small 3 mm subcutaneous nodule in the right arm axillary region.  Otherwise she has had no new symptoms.  REVIEW OF SYSTEMS:  Otherwise negative.  FAMILY HISTORY:  Reviewed and unchanged.  SOCIAL HISTORY:  Reviewed and unchanged.  She comes with her husband today.  PHYSICAL EXAMINATION  VITAL SIGNS:  Weight 171 pounds, blood pressure 165/82.  GENERAL:  The patient is a healthy white female in no acute distress.  HEENT:  Negative.  NECK:  Supple without thyromegaly.  LYMPH:  There is no supraclavicular, axillary, or inguinal adenopathy. Palpation of her subaxillary incision shows some thickening at one pole but she says this is unchanged.  She also has a 3-4 mm nodule in the ridge of the biceps.  ABDOMEN:  Soft and nontender.  No mass, organomegaly, ascites, or hernias are noted.  PELVIC:  EGBUS normal.  Vagina is clean, well supported. Bimanual/rectovaginal examinations reveal no masses, induration, or nodularity.  IMPRESSION:  Stage IV ovarian cancer October 2000.  No evidence of  recurrent disease.  A CA-125 will be obtained today.  The patient will return to see Dr. [name] in three months and return to see [name] in six months. Dictated by:   Rande Brunt. Clarke-Pearson, M.D. Attending Physician:  [name] DD:  08/02/01 TD:  08/04/01 Job: [id number]

## 2010-08-14 NOTE — Consult Note (Signed)
[facility]  Patient:    Katie, [name]                        MRN: [id number] Proc. Date: 02/16/00 Adm. Date:  [id number] Attending:  Jeannette [name] CC:         [name], M.D.  [name], M.D.  [name], M.D.  [name], R.N.   Consultation Report  HISTORY:  [age]-year-old white female who has stage IV ovarian cancer. She underwent initial debulking and staging, December 30, 1998.  She was found to have a poorly differentiated metastatic ovarian cancer.  She had disease in her peritoneal cavity and a subcutaneous mass lateral to the right breast. All gross tumor in the peritoneal cavity was resected.  She subsequently received six cycles of carboplatin and Taxol chemotherapy and has been followed since that time with no evidence of recurrent disease.  The patient reports since her last visit, she has had no GI or GU symptoms, has no pelvic pain, pressure, vaginal bleeding or discharge.  Her functional status is good, although she feels somewhat lethargic.  She has seen Dr. Raymond Gurney C. [name] in the meantime and had a CA125 value on November 13th of 12.  REVIEW OF SYSTEMS:  Review of systems reveals no cardiovascular, pulmonary, neurologic, GI or GU symptoms.  FAMILY HISTORY AND SOCIAL HISTORY:  Reviewed and unchanged.  Patient comes accompanied by her husband today.  PHYSICAL EXAMINATION  VITAL SIGNS:  Weight 163 pounds (up 13 pounds since May).  Blood pressure 148/80.  GENERAL:  Patient is a healthy white female in no acute distress.  HEENT:  Negative.  NECK:  Supple without thyromegaly.  NODES:  There is no supraclavicular, axillary or inguinal adenopathy.  There are no axillary lymph nodes.  ABDOMEN:  The abdomen is soft and nontender.  No mass, organomegaly, ascites or herniae are noted.  There are no other abnormalities.  PELVIC:  EG/BUS normal. Vagina is clean and well-supported but  atrophic. Bimanual and rectovaginal exam revealed no masses, induration or nodularity.  RECTAL:  She has some anal stricturing.  IMPRESSION:  Stage IV ovarian cancer, status post complete surgical resection and adjuvant chemotherapy.  No evidence of recurrent disease.  PLAN:  The patient will return to see Dr. Darnelle [name] in February and return to see [name] in May.  We will obtain CA125s at each visit. DD:  02/16/00 TD:  02/17/00 Job: [id number]

## 2010-12-14 ENCOUNTER — Other Ambulatory Visit: Payer: Self-pay

## 2010-12-14 ENCOUNTER — Emergency Department (HOSPITAL_BASED_OUTPATIENT_CLINIC_OR_DEPARTMENT_OTHER)
Admission: EM | Admit: 2010-12-14 | Discharge: 2010-12-14 | Disposition: A | Discharge status: Home / Self Care | Payer: Medicare Other | Source: Home / Self Care | Attending: Emergency Medicine | Admitting: Emergency Medicine

## 2010-12-14 ENCOUNTER — Inpatient Hospital Stay (HOSPITAL_COMMUNITY)
Admission: AD | Admit: 2010-12-14 | Discharge: 2010-12-18 | DRG: 193 | Disposition: A | Discharge status: Home / Self Care | Payer: Medicare Other | Source: Other Acute Inpatient Hospital | Attending: Internal Medicine | Admitting: Internal Medicine

## 2010-12-14 ENCOUNTER — Emergency Department (INDEPENDENT_AMBULATORY_CARE_PROVIDER_SITE_OTHER): Payer: Medicare Other

## 2010-12-14 ENCOUNTER — Encounter: Payer: Self-pay | Admitting: Family Medicine

## 2010-12-14 DIAGNOSIS — T451X5A Adverse effect of antineoplastic and immunosuppressive drugs, initial encounter: Secondary | ICD-10-CM | POA: Diagnosis present

## 2010-12-14 DIAGNOSIS — J189 Pneumonia, unspecified organism: Principal | ICD-10-CM | POA: Diagnosis present

## 2010-12-14 DIAGNOSIS — M949 Disorder of cartilage, unspecified: Secondary | ICD-10-CM | POA: Insufficient documentation

## 2010-12-14 DIAGNOSIS — J441 Chronic obstructive pulmonary disease with (acute) exacerbation: Secondary | ICD-10-CM | POA: Diagnosis present

## 2010-12-14 DIAGNOSIS — Z95810 Presence of automatic (implantable) cardiac defibrillator: Secondary | ICD-10-CM

## 2010-12-14 DIAGNOSIS — Z8543 Personal history of malignant neoplasm of ovary: Secondary | ICD-10-CM

## 2010-12-14 DIAGNOSIS — I10 Essential (primary) hypertension: Secondary | ICD-10-CM | POA: Diagnosis present

## 2010-12-14 DIAGNOSIS — I509 Heart failure, unspecified: Secondary | ICD-10-CM | POA: Diagnosis present

## 2010-12-14 DIAGNOSIS — J45901 Unspecified asthma with (acute) exacerbation: Secondary | ICD-10-CM | POA: Diagnosis present

## 2010-12-14 DIAGNOSIS — R0609 Other forms of dyspnea: Secondary | ICD-10-CM

## 2010-12-14 DIAGNOSIS — R0602 Shortness of breath: Secondary | ICD-10-CM | POA: Insufficient documentation

## 2010-12-14 DIAGNOSIS — E039 Hypothyroidism, unspecified: Secondary | ICD-10-CM | POA: Diagnosis present

## 2010-12-14 DIAGNOSIS — M899 Disorder of bone, unspecified: Secondary | ICD-10-CM | POA: Insufficient documentation

## 2010-12-14 DIAGNOSIS — Z7982 Long term (current) use of aspirin: Secondary | ICD-10-CM

## 2010-12-14 DIAGNOSIS — I5032 Chronic diastolic (congestive) heart failure: Secondary | ICD-10-CM | POA: Diagnosis present

## 2010-12-14 DIAGNOSIS — E871 Hypo-osmolality and hyponatremia: Secondary | ICD-10-CM | POA: Diagnosis present

## 2010-12-14 DIAGNOSIS — J9600 Acute respiratory failure, unspecified whether with hypoxia or hypercapnia: Secondary | ICD-10-CM | POA: Diagnosis present

## 2010-12-14 DIAGNOSIS — G5790 Unspecified mononeuropathy of unspecified lower limb: Secondary | ICD-10-CM | POA: Diagnosis present

## 2010-12-14 DIAGNOSIS — I428 Other cardiomyopathies: Secondary | ICD-10-CM | POA: Diagnosis present

## 2010-12-14 HISTORY — DX: Malignant (primary) neoplasm, unspecified: C80.1

## 2010-12-14 HISTORY — DX: Disorder of thyroid, unspecified: E07.9

## 2010-12-14 LAB — BASIC METABOLIC PANEL
BUN: 8 mg/dL (ref 6–23)
CO2: 26 mEq/L (ref 19–32)
Chloride: 82 mEq/L — ABNORMAL LOW (ref 96–112)
Glucose, Bld: 156 mg/dL — ABNORMAL HIGH (ref 70–99)
Potassium: 4.3 mEq/L (ref 3.5–5.1)

## 2010-12-14 LAB — BLOOD GAS, ARTERIAL
Bicarbonate: 27.6 mEq/L — ABNORMAL HIGH (ref 20.0–24.0)
TCO2: 24.3 mmol/L (ref 0–100)
pCO2 arterial: 41.2 mmHg (ref 35.0–45.0)
pH, Arterial: 7.441 — ABNORMAL HIGH (ref 7.350–7.400)
pO2, Arterial: 83.8 mmHg (ref 80.0–100.0)

## 2010-12-14 LAB — PRO B NATRIURETIC PEPTIDE: Pro B Natriuretic peptide (BNP): 937.3 pg/mL — ABNORMAL HIGH (ref 0–450)

## 2010-12-14 LAB — DIFFERENTIAL
Basophils Relative: 0 % (ref 0–1)
Lymphocytes Relative: 6 % — ABNORMAL LOW (ref 12–46)
Lymphs Abs: 0.8 10*3/uL (ref 0.7–4.0)
Monocytes Relative: 12 % (ref 3–12)
Neutro Abs: 11.6 10*3/uL — ABNORMAL HIGH (ref 1.7–7.7)

## 2010-12-14 LAB — CARDIAC PANEL(CRET KIN+CKTOT+MB+TROPI): Relative Index: 3.2 — ABNORMAL HIGH (ref 0.0–2.5)

## 2010-12-14 LAB — TROPONIN I: Troponin I: <0.3 ng/mL (ref <0.30)

## 2010-12-14 LAB — CK TOTAL AND CKMB (NOT AT ARMC)
CK, MB: 4.3 ng/mL — ABNORMAL HIGH (ref 0.3–4.0)
Total CK: 174 U/L (ref 7–177)

## 2010-12-14 LAB — CBC
HCT: 33.2 % — ABNORMAL LOW (ref 36.0–46.0)
Hemoglobin: 11.9 g/dL — ABNORMAL LOW (ref 12.0–15.0)
MCV: 88.1 fL (ref 78.0–100.0)
WBC: 14.1 10*3/uL — ABNORMAL HIGH (ref 4.0–10.5)

## 2010-12-14 MED ORDER — ALBUTEROL SULFATE (5 MG/ML) 0.5% IN NEBU
2.5000 mg | INHALATION_SOLUTION | Freq: Once | RESPIRATORY_TRACT | Status: AC
  Administered 2010-12-14: 2.5 mg via RESPIRATORY_TRACT
  Filled 2010-12-14: qty 0.5

## 2010-12-14 MED ORDER — DEXTROSE 5 % IV SOLN
1.0000 g | Freq: Once | INTRAVENOUS | Status: AC
  Administered 2010-12-14: 1 g via INTRAVENOUS
  Filled 2010-12-14: qty 1

## 2010-12-14 MED ORDER — SODIUM CHLORIDE 0.9 % IV BOLUS (SEPSIS): 500.0000 mL | Freq: Once | INTRAVENOUS | Status: DC

## 2010-12-14 MED ORDER — MORPHINE SULFATE 4 MG/ML IJ SOLN
4.0000 mg | Freq: Once | INTRAMUSCULAR | Status: AC
  Administered 2010-12-14: 4 mg via INTRAVENOUS
  Filled 2010-12-14: qty 1

## 2010-12-14 MED ORDER — SODIUM CHLORIDE 0.9 % IV BOLUS (SEPSIS): 1000.0000 mL | Freq: Once | INTRAVENOUS | Status: DC

## 2010-12-14 MED ORDER — IPRATROPIUM BROMIDE 0.02 % IN SOLN
0.5000 mg | Freq: Once | RESPIRATORY_TRACT | Status: AC
  Administered 2010-12-14: 0.5 mg via RESPIRATORY_TRACT
  Filled 2010-12-14: qty 2.5

## 2010-12-14 MED ORDER — DEXTROSE 5 % IV SOLN
500.0000 mg | Freq: Once | INTRAVENOUS | Status: AC
  Administered 2010-12-14: 500 mg via INTRAVENOUS
  Filled 2010-12-14: qty 500

## 2010-12-14 NOTE — ED Notes (Signed)
Family at bedside. Pt reports burning sensation at site of IV. Infusion stopped, IV dc'd, ice pack applied to area. Pain meds given as ordered. Primary care RN notified of condition.

## 2010-12-14 NOTE — ED Notes (Signed)
Pt states that she is feeling worse than she did pt is coughing up greenish sputum explained to pt that we will notify MD of how she feels and that she does have pneumonia and will be admitted pts husband at the bedside

## 2010-12-14 NOTE — ED Notes (Signed)
Pt ambulatory to restroom with assistance tolerated well pt returned to bed without incident replaced on cardiac monitor and oxygen via nasal cannula at 2l oxygen saturation 99% with oxygen

## 2010-12-14 NOTE — ED Notes (Signed)
Pt c/o "feeling short of breath" and "coughing up green sputum" x 4 days and worse today. Pt able to speak in complete sentences. Increased work of breathing noted.

## 2010-12-14 NOTE — ED Notes (Signed)
500 cc bolus of normal saline started due to sodium level of 118

## 2010-12-14 NOTE — ED Provider Notes (Addendum)
History     CSN: [id number] Arrival date & time: 12/14/2010 12:03 PM   Chief Complaint  Patient presents with  . Shortness of Breath     (Include location/radiation/quality/duration/timing/severity/associated sxs/prior treatment) Patient is a 77 y.o. female presenting with shortness of breath. The history is provided by the patient.  Shortness of Breath  The current episode started 3 to 5 days ago. Associated symptoms include shortness of breath.   The patient reports feeling short of breath and coughing up green sputum for the past 4 days. She also feels that she is unable to get a deep breath in. She denies any fevers or chest pain she does describe some difficulty in lying flat as well as some exertional dyspnea. She does note chronic swelling to her lower extremities which is unchanged. Denies any abdominal or back pain  Past Medical History  Diagnosis Date  . Pacemaker   . Pneumonia   . Thyroid disease   . Cancer      Past Surgical History  Procedure Date  . Appendectomy   . Cholecystectomy   . Pacemaker insertion   . Abdominal hysterectomy     No family history on file.  History  Substance Use Topics  . Smoking status: Never Smoker   . Smokeless tobacco: Not on file  . Alcohol Use: No    OB History    Grav Para Term Preterm Abortions TAB SAB Ect Mult Living                  Review of Systems  Respiratory: Positive for shortness of breath.   All other systems reviewed and are negative.    Allergies  Review of patient's allergies indicates no known allergies.  Home Medications  No current outpatient prescriptions on file.  Physical Exam    BP 194/95  Pulse 63  Temp(Src) 98.8 F (37.1 C) (Oral)  Resp 20  Ht 5\' 3"  (1.6 m)  Wt 160 lb (72.576 kg)  BMI 28.34 kg/m2  SpO2 94%  Physical Exam  Constitutional: She is oriented to person, place, and time. She appears well-developed and well-nourished.  HENT:  Head: Normocephalic and atraumatic.    Eyes: Conjunctivae and EOM are normal. Pupils are equal, round, and reactive to light.  Neck: Neck supple.  Cardiovascular: Normal rate and regular rhythm.  Exam reveals no gallop and no friction rub.   No murmur heard. Pulmonary/Chest: No respiratory distress. She has no wheezes. She has rales. She exhibits no tenderness.       Faint Rales heard in left posterior lung bases. Otherwise no rhonchi no retractions.  Abdominal: Soft. Bowel sounds are normal. She exhibits no distension. There is no tenderness. There is no rebound and no guarding.  Musculoskeletal: Normal range of motion.       Trace edema in the lower extremities bilaterally no calf tenderness no redness  Neurological: She is alert and oriented to person, place, and time. No cranial nerve deficit. Coordination normal.  Skin: Skin is warm and dry. No rash noted.  Psychiatric: She has a normal mood and affect.    ED Course  Procedures   No results found.   No diagnosis found.   MDM Pt is seen and examined;  Initial history and physical completed.  Will follow.         Genella Bas A. Patrica Duel, MD 12/14/10 1208   Date: 12/14/2010  Rate: 85   Rhythm: normal sinus rhythm  QRS Axis: right  Intervals: normal  ST/T  Wave abnormalities: nonspecific ST/T changes  Conduction Disutrbances:right bundle branch block  Narrative Interpretation:   Old EKG Reviewed: changes noted Sinus with RBBB has replaced paced rhythm.       Carys Malina A. Patrica Duel, MD 12/14/10 1253  Results for orders placed during the hospital encounter of 12/14/10  CBC      Component Value Range   WBC 14.1 (*) 4.0 - 10.5 (K/uL)   RBC 3.77 (*) 3.87 - 5.11 (MIL/uL)   Hemoglobin 11.9 (*) 12.0 - 15.0 (g/dL)   HCT 40.9 (*) 81.1 - 46.0 (%)   MCV 88.1  78.0 - 100.0 (fL)   MCH 31.6  26.0 - 34.0 (pg)   MCHC 35.8  30.0 - 36.0 (g/dL)   RDW 91.4  78.2 - 95.6 (%)   Platelets 197  150 - 400 (K/uL)  DIFFERENTIAL      Component Value Range   Neutrophils Relative  PENDING  43 - 77 (%)   Neutro Abs PENDING  1.7 - 7.7 (K/uL)   Band Neutrophils PENDING  0 - 10 (%)   Lymphocytes Relative PENDING  12 - 46 (%)   Lymphs Abs PENDING  0.7 - 4.0 (K/uL)   Monocytes Relative PENDING  3 - 12 (%)   Monocytes Absolute PENDING  0.1 - 1.0 (K/uL)   Eosinophils Relative PENDING  0 - 5 (%)   Eosinophils Absolute PENDING  0.0 - 0.7 (K/uL)   Basophils Relative PENDING  0 - 1 (%)   Basophils Absolute PENDING  0.0 - 0.1 (K/uL)   WBC Morphology PENDING     RBC Morphology PENDING     Smear Review PENDING     nRBC PENDING  0 (/100 WBC)   Metamyelocytes Relative PENDING     Myelocytes PENDING     Promyelocytes Absolute PENDING     Blasts PENDING     Dg Chest 2 View  12/14/2010  *RADIOLOGY REPORT*  Clinical Data: Dyspnea  CHEST - 2 VIEW  Comparison: 06/10/2009  Findings: Cardiomegaly is noted.  Three leads cardiac pacemaker is unchanged in position.  Chronic interstitial prominence.  There is hazy airspace disease in the right base medially suspicious for evolving infiltrate/pneumonia.  Atherosclerotic calcifications of thoracic aorta again noted.  Osteopenia and degenerative changes thoracic spine.  Stable compression deformity in   lower thoracic spine.  IMPRESSION:  There is hazy airspace disease in the right base medially suspicious for evolving infiltrate/pneumonia.  Original Report Authenticated By: Natasha Mead, M.D.      Callista Hoh A. Patrica Duel, MD 12/14/10 1345  1:46 PM X-rays reviewed by myself and interpreted by the radiologist showing right medial evolving infiltrate and pneumonia. This does correlate with the patient's clinical symptoms.  Tried hospitalist will be paged for further care and evaluation  Climmie Buelow A. Patrica Duel, MD 12/14/10 1347  Results for orders placed during the hospital encounter of 12/14/10  CARDIAC PANEL(CRET KIN+CKTOT+MB+TROPI)      Component Value Range   Total CK 121  7 - 177 (U/L)   CK, MB PENDING  0.3 - 4.0 (ng/mL)   Troponin I PENDING  <0.30  (ng/mL)   Relative Index PENDING  0.0 - 2.5   CBC      Component Value Range   WBC 14.1 (*) 4.0 - 10.5 (K/uL)   RBC 3.77 (*) 3.87 - 5.11 (MIL/uL)   Hemoglobin 11.9 (*) 12.0 - 15.0 (g/dL)   HCT 21.3 (*) 08.6 - 46.0 (%)   MCV 88.1  78.0 - 100.0 (fL)   MCH  31.6  26.0 - 34.0 (pg)   MCHC 35.8  30.0 - 36.0 (g/dL)   RDW 16.1  09.6 - 04.5 (%)   Platelets 197  150 - 400 (K/uL)  DIFFERENTIAL      Component Value Range   Neutrophils Relative PENDING  43 - 77 (%)   Neutro Abs PENDING  1.7 - 7.7 (K/uL)   Band Neutrophils PENDING  0 - 10 (%)   Lymphocytes Relative PENDING  12 - 46 (%)   Lymphs Abs PENDING  0.7 - 4.0 (K/uL)   Monocytes Relative PENDING  3 - 12 (%)   Monocytes Absolute PENDING  0.1 - 1.0 (K/uL)   Eosinophils Relative PENDING  0 - 5 (%)   Eosinophils Absolute PENDING  0.0 - 0.7 (K/uL)   Basophils Relative PENDING  0 - 1 (%)   Basophils Absolute PENDING  0.0 - 0.1 (K/uL)   WBC Morphology PENDING     RBC Morphology PENDING     Smear Review PENDING     nRBC PENDING  0 (/100 WBC)   Metamyelocytes Relative PENDING     Myelocytes PENDING     Promyelocytes Absolute PENDING     Blasts PENDING    BASIC METABOLIC PANEL      Component Value Range   Sodium 118 (*) 135 - 145 (mEq/L)   Potassium 4.3  3.5 - 5.1 (mEq/L)   Chloride 82 (*) 96 - 112 (mEq/L)   CO2 26  19 - 32 (mEq/L)   Glucose, Bld 156 (*) 70 - 99 (mg/dL)   BUN 8  6 - 23 (mg/dL)   Creatinine, Ser <4.09 (*) 0.50 - 1.10 (mg/dL)   Calcium 9.1  8.4 - 81.1 (mg/dL)   GFR calc non Af Amer NOT CALCULATED  >60 (mL/min)   GFR calc Af Amer NOT CALCULATED  >60 (mL/min)   Dg Chest 2 View  12/14/2010  *RADIOLOGY REPORT*  Clinical Data: Dyspnea  CHEST - 2 VIEW  Comparison: 06/10/2009  Findings: Cardiomegaly is noted.  Three leads cardiac pacemaker is unchanged in position.  Chronic interstitial prominence.  There is hazy airspace disease in the right base medially suspicious for evolving infiltrate/pneumonia.  Atherosclerotic  calcifications of thoracic aorta again noted.  Osteopenia and degenerative changes thoracic spine.  Stable compression deformity in   lower thoracic spine.  IMPRESSION:  There is hazy airspace disease in the right base medially suspicious for evolving infiltrate/pneumonia.  Original Report Authenticated By: Natasha Mead, M.D.    Basic metabolic panel is reviewed. Hyponatremia as noted we'll start her on a normal saline bolus. Patient will be transferred to [facility] in stable condition  Theron Arista A. Patrica Duel, MD 12/14/10 1411   2:38 PM The patient is reassessed in the room. Breathing comfortably but is still coughing up phlegm. Patient feels she got "choked up" with phlegm. However her pulse ox remained 98% pulse rate and blood pressure normal she will be given additional albuterol and Atrovent breathing treatment. Remains stable at this time pending transfer to [facility] for further care  Arshdeep Bolger A. Jacki Couse, MD 12/14/10 1438  Results for orders placed during the hospital encounter of 12/14/10  PRO B NATRIURETIC PEPTIDE      Component Value Range   BNP, POC 937.3 (*) 0 - 450 (pg/mL)  CARDIAC PANEL(CRET KIN+CKTOT+MB+TROPI)      Component Value Range   Total CK 121  7 - 177 (U/L)   CK, MB 3.9  0.3 - 4.0 (ng/mL)   Troponin I <  0.30  <0.30 (ng/mL)   Relative Index 3.2 (*) 0.0 - 2.5   CBC      Component Value Range   WBC 14.1 (*) 4.0 - 10.5 (K/uL)   RBC 3.77 (*) 3.87 - 5.11 (MIL/uL)   Hemoglobin 11.9 (*) 12.0 - 15.0 (g/dL)   HCT 16.1 (*) 09.6 - 46.0 (%)   MCV 88.1  78.0 - 100.0 (fL)   MCH 31.6  26.0 - 34.0 (pg)   MCHC 35.8  30.0 - 36.0 (g/dL)   RDW 04.5  40.9 - 81.1 (%)   Platelets 197  150 - 400 (K/uL)  DIFFERENTIAL      Component Value Range   Neutrophils Relative 82 (*) 43 - 77 (%)   Lymphocytes Relative 6 (*) 12 - 46 (%)   Monocytes Relative 12  3 - 12 (%)   Eosinophils Relative 0  0 - 5 (%)   Basophils Relative 0  0 - 1 (%)   Neutro Abs 11.6 (*) 1.7 - 7.7 (K/uL)   Lymphs  Abs 0.8  0.7 - 4.0 (K/uL)   Monocytes Absolute 1.7 (*) 0.1 - 1.0 (K/uL)   Eosinophils Absolute 0.0  0.0 - 0.7 (K/uL)   Basophils Absolute 0.0  0.0 - 0.1 (K/uL)   Smear Review WITHIN NORMAL LIMITS    BASIC METABOLIC PANEL      Component Value Range   Sodium 118 (*) 135 - 145 (mEq/L)   Potassium 4.3  3.5 - 5.1 (mEq/L)   Chloride 82 (*) 96 - 112 (mEq/L)   CO2 26  19 - 32 (mEq/L)   Glucose, Bld 156 (*) 70 - 99 (mg/dL)   BUN 8  6 - 23 (mg/dL)   Creatinine, Ser <9.14 (*) 0.50 - 1.10 (mg/dL)   Calcium 9.1  8.4 - 78.2 (mg/dL)   GFR calc non Af Amer NOT CALCULATED  >60 (mL/min)   GFR calc Af Amer NOT CALCULATED  >60 (mL/min)   Dg Chest 2 View  12/14/2010  *RADIOLOGY REPORT*  Clinical Data: Dyspnea  CHEST - 2 VIEW  Comparison: 06/10/2009  Findings: Cardiomegaly is noted.  Three leads cardiac pacemaker is unchanged in position.  Chronic interstitial prominence.  There is hazy airspace disease in the right base medially suspicious for evolving infiltrate/pneumonia.  Atherosclerotic calcifications of thoracic aorta again noted.  Osteopenia and degenerative changes thoracic spine.  Stable compression deformity in   lower thoracic spine.  IMPRESSION:  There is hazy airspace disease in the right base medially suspicious for evolving infiltrate/pneumonia.  Original Report Authenticated By: Natasha Mead, M.D.      Analeia Ismael A. Patrica Duel, MD 12/14/10 1440

## 2010-12-15 LAB — MAGNESIUM
Magnesium: 2 mg/dL (ref 1.5–2.5)
Magnesium: 2.2 mg/dL (ref 1.5–2.5)

## 2010-12-15 LAB — COMPREHENSIVE METABOLIC PANEL
Alkaline Phosphatase: 75 U/L (ref 39–117)
BUN: 10 mg/dL (ref 6–23)
Chloride: 82 mEq/L — ABNORMAL LOW (ref 96–112)
GFR calc Af Amer: >60 mL/min (ref >60)
GFR calc non Af Amer: >60 mL/min (ref >60)
Glucose, Bld: 124 mg/dL — ABNORMAL HIGH (ref 70–99)
Potassium: 4.1 mEq/L (ref 3.5–5.1)
Total Bilirubin: 0.3 mg/dL (ref 0.3–1.2)

## 2010-12-15 LAB — CK TOTAL AND CKMB (NOT AT ARMC)
CK, MB: 3.9 ng/mL (ref 0.3–4.0)
Relative Index: 2.3 (ref 0.0–2.5)

## 2010-12-15 LAB — DIFFERENTIAL
Basophils Absolute: 0 10*3/uL (ref 0.0–0.1)
Eosinophils Relative: 1 % (ref 0–5)
Lymphocytes Relative: 8 % — ABNORMAL LOW (ref 12–46)
Monocytes Absolute: 1.7 10*3/uL — ABNORMAL HIGH (ref 0.1–1.0)

## 2010-12-15 LAB — CBC
HCT: 31.3 % — ABNORMAL LOW (ref 36.0–46.0)
MCHC: 35.8 g/dL (ref 30.0–36.0)
MCV: 88.9 fL (ref 78.0–100.0)
RDW: 12 % (ref 11.5–15.5)

## 2010-12-15 LAB — PHOSPHORUS: Phosphorus: 2.9 mg/dL (ref 2.3–4.6)

## 2010-12-15 LAB — TROPONIN I: Troponin I: <0.3 ng/mL (ref <0.30)

## 2010-12-15 LAB — BASIC METABOLIC PANEL
Calcium: 8.5 mg/dL (ref 8.4–10.5)
Chloride: 78 mEq/L — ABNORMAL LOW (ref 96–112)
Creatinine, Ser: 0.5 mg/dL (ref 0.50–1.10)
GFR calc Af Amer: >60 mL/min (ref >60)
Sodium: 118 mEq/L — CL (ref 135–145)

## 2010-12-15 NOTE — H&P (Signed)
[name], [name]                 ACCOUNT NO.:  [id number]  MEDICAL RECORD NO.:  [id number]  LOCATION:  1412                         FACILITY:  [facility]  PHYSICIAN:  Marisa Hufstetler, DO         DATE OF BIRTH:  1929-02-20  DATE OF ADMISSION:  12/14/2010 DATE OF DISCHARGE:                             HISTORY & PHYSICAL   CHIEF COMPLAINT:  Shortness of breath.  HISTORY OF PRESENT ILLNESS:  Patient is an 77 year old female, who states that she does not really remember everything very well, but she states that she has had difficulty of breathing for 4 days.  She states that she has had fever, chills, poor appetite.  She said she has not had anything to eat for 4 days because she did not feel like eating.  She states she feels really tired and fatigued and it is hard to breathe. She has had a cough, productive of purulent sputum, but she denies chest pain.  PAST MEDICAL HISTORY:  Significant for: 1. Hypertension. 2. COPD. 3. Hypothyroidism. 4. Asthma.5. CHF, New [place] Heart Association class III. 6. Nonischemic cardiomyopathy and ejection fraction in May 2009 was 60     to 65%. 7. Diastolic dysfunction, pacemaker with biventricular sensing and     AICD. 8. Left bundle-branch block. 9. Ovarian cancer. 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  PAST SURGICAL HISTORY:  Significant for: 1. Tonsils and adenoids. 2. Defibrillator pacemaker. 3. Cholecystectomy. 4. Total abdominal hysterectomy. 5. Left wrist surgery. 6. Shoulder surgery.  MEDICATIONS AT HOME:  Include: 1. Coreg 12.5 one p.o. b.i.d. 2. Oxycodone and acetaminophen 5/325 one p.o. q.6 hours p.r.n. pain. 3. Alendronate 70 mg one p.o. weekly. 4. Enteric-coated aspirin 81 mg once daily. 5. Lasix 80 mg 2 p.o. every evening. 6. Cozaar 100 mg 1 tablet p.o. every morning. 7. Synthroid 175 mcg 1 p.o. each morning. 8. Digoxin 0.25 mg 1 p.o. daily.  ALLERGIES:  NKDA.  REVIEW OF SYSTEMS:  CONSTITUTIONAL:  Positive  for fever.  Positive for chills.  Positive for weakness.  Positive for fatigue.  CNS:  No headaches, no seizures.  No numbness.  ENT:  No nasal congestion.  No throat pain.  No coryza.  CARDIOVASCULAR:  No chest pain or palpitations, no orthopnea.  RESPIRATORY:  Positive for cough.  Positive for wheeze.  Positive for shortness of breath.  Positive for purulent sputum.  GASTROINTESTINAL:  Positive for loss of appetite.  Positive for constipation.  Negative for diarrhea.  Positive for nausea.  Negative for vomiting.  GENITOURINARY:  No dysuria.  No hematuria.  No urinary frequency.  RENAL:  No flank pain.  No swelling.  No pruritus.  SKIN: No rashes, sores, or lesions.  HEMATOLOGICAL:  No easy bruising.  No purpura.  No clots.  LYMPHS:  No lymphadenopathy.  No painful symptoms, specific lymph swelling.  PSYCHIATRIC:  No anxiety.  No depression or insomnia.  SOCIAL:  No tobacco, no alcohol, no recreational drug use.  The patient is a full code.  FAMILY MEDICAL HISTORY:  Mother died of lung cancer in her 42s.  Father died of an MI at 83.  PHYSICAL EXAMINATION:  VITAL SIGNS:  Temperature is 98.8, pulse 62, respiratory rate 20, blood pressure 194/95, 94%. GENERAL:  The patient is awake, alert, and ordered x3.  The patient appears to working quite hard to breathe.  She has one to two word conversational dyspnea.  She is on 2 liters of O2, although she keeps taking it off to wipe her nose.  EYES:  Pupils are equal, round, and reactive to light and accommodation.  External ocular movement are bilaterally intact.  Sclerae nonicteric, noninjected. MOUTH:  Oral mucosa dry.  No lesions.  No sores.  Pharynx, clean.  No erythema, no exudate. NECK:  Negative for JVD.  Negative for thyromegaly.  Negative for lymphadenopathy. HEART:  Regular rate and rhythm at 80 beats per minute without murmur, ectopy, or gallops.  No lateral PMI.  No thrills. LUNGS:  Positive for small wheezes bilaterally.   Positive for decreased breath sounds bilaterally.  Positive for rales in the right base. Positive for increased work of breathing.  No tactile fremitus. ABDOMEN:  Morbidly obese, soft, nontender, nondistended, very full feeling.  Left hypogastric hernia, which is reducible and is nontender and no hepatosplenomegaly.  No hernias palpated. EXTREMITIES:  Have 2 to 3+ plus pitting edema lower extremities bilaterally.  The patient has greatly diminished dorsalis pedis and popliteal pulses bilaterally.  No carotid bruits bilaterally. NEUROLOGICAL:  Cranial nerves II through XII gross intact.  Motor and sensory intact.  LABORATORY DATA:  WBC is 14.1, hemoglobin 11.9, hematocrit 33.2, platelets 137, 82% neutrophils.  Sodium 118, potassium 4.3, chloride 82, CO2 26, BUN is 8, creatinine 0.47, CK is 121, MB is 3.9, BNP is 997.3. Portable chest shows hazy opacities in the right base medially as well as a compression fracture to the lower spine.  ASSESSMENT/PLAN: 1. Right lower lobe pneumonia. 2. Respiratory failure secondary to right lower lobe pneumonia and     chronic obstructive pulmonary disease exacerbation. 3. Chronic obstructive pulmonary disease exacerbation. 4. Hyponatremia. 5. Congestive heart failure exacerbation. 6. Hypertension, uncontrolled. 7. Cardiomyopathy. 8. Pacemaker.  PLAN: 1. Admit to telemetry. 2. Check an ABG. 3. Diuresis. 4. IV antibiotics. 5. Mucolytic. 6. Continue home meds. 7. Check a digoxin level. 8. MiraLax. 9. Diurese.  I spent 72 minutes on this critical care admission.          ______________________________ Fran Lowes, DO     AS/MEDQ  D:  12/14/2010  T:  12/15/2010  Job:  [id number]  cc:   Molly Maduro L. Foy [place], M.D. Fax: [phone]  Electronically Signed by Fran Lowes DO on 12/15/2010 10:57:15 PM

## 2010-12-16 ENCOUNTER — Inpatient Hospital Stay (HOSPITAL_COMMUNITY): Payer: Medicare Other

## 2010-12-16 LAB — COMPREHENSIVE METABOLIC PANEL
AST: 25 U/L (ref 0–37)
Albumin: 2.9 g/dL — ABNORMAL LOW (ref 3.5–5.2)
Alkaline Phosphatase: 94 U/L (ref 39–117)
Chloride: 83 mEq/L — ABNORMAL LOW (ref 96–112)
Potassium: 3.6 mEq/L (ref 3.5–5.1)
Total Bilirubin: 0.3 mg/dL (ref 0.3–1.2)

## 2010-12-16 LAB — DIFFERENTIAL
Basophils Absolute: 0 10*3/uL (ref 0.0–0.1)
Eosinophils Absolute: 0 10*3/uL (ref 0.0–0.7)
Eosinophils Relative: 0 % (ref 0–5)
Lymphocytes Relative: 8 % — ABNORMAL LOW (ref 12–46)

## 2010-12-16 LAB — CBC
Platelets: 286 10*3/uL (ref 150–400)
RDW: 12 % (ref 11.5–15.5)
WBC: 12.4 10*3/uL — ABNORMAL HIGH (ref 4.0–10.5)

## 2010-12-17 ENCOUNTER — Inpatient Hospital Stay (HOSPITAL_COMMUNITY): Payer: Medicare Other

## 2010-12-17 LAB — COMPREHENSIVE METABOLIC PANEL
ALT: 26 U/L (ref 0–35)
BUN: 19 mg/dL (ref 6–23)
Calcium: 8.1 mg/dL — ABNORMAL LOW (ref 8.4–10.5)
Creatinine, Ser: 0.49 mg/dL — ABNORMAL LOW (ref 0.50–1.10)
GFR calc Af Amer: >60 mL/min (ref >60)
Glucose, Bld: 133 mg/dL — ABNORMAL HIGH (ref 70–99)
Sodium: 122 mEq/L — ABNORMAL LOW (ref 135–145)
Total Protein: 6.5 g/dL (ref 6.0–8.3)

## 2010-12-17 LAB — DIFFERENTIAL
Basophils Relative: 0 % (ref 0–1)
Eosinophils Absolute: 0 10*3/uL (ref 0.0–0.7)
Monocytes Absolute: 0.7 10*3/uL (ref 0.1–1.0)
Monocytes Relative: 5 % (ref 3–12)
Neutro Abs: 11.6 10*3/uL — ABNORMAL HIGH (ref 1.7–7.7)

## 2010-12-17 LAB — CBC
Hemoglobin: 11.6 g/dL — ABNORMAL LOW (ref 12.0–15.0)
MCH: 32 pg (ref 26.0–34.0)
MCHC: 36 g/dL (ref 30.0–36.0)

## 2010-12-17 LAB — MAGNESIUM: Magnesium: 2.3 mg/dL (ref 1.5–2.5)

## 2010-12-18 LAB — COMPREHENSIVE METABOLIC PANEL
ALT: 27 U/L (ref 0–35)
AST: 22 U/L (ref 0–37)
Albumin: 2.9 g/dL — ABNORMAL LOW (ref 3.5–5.2)
Alkaline Phosphatase: 85 U/L (ref 39–117)
Chloride: 85 mEq/L — ABNORMAL LOW (ref 96–112)
Potassium: 3.6 mEq/L (ref 3.5–5.1)
Sodium: 124 mEq/L — ABNORMAL LOW (ref 135–145)
Total Bilirubin: 0.2 mg/dL — ABNORMAL LOW (ref 0.3–1.2)

## 2010-12-18 LAB — CBC
HCT: 34.3 % — ABNORMAL LOW (ref 36.0–46.0)
MCH: 31.6 pg (ref 26.0–34.0)
MCV: 88.9 fL (ref 78.0–100.0)
Platelets: 297 10*3/uL (ref 150–400)
RDW: 11.8 % (ref 11.5–15.5)

## 2010-12-18 LAB — DIFFERENTIAL
Eosinophils Absolute: 0 10*3/uL (ref 0.0–0.7)
Eosinophils Relative: 0 % (ref 0–5)
Lymphocytes Relative: 10 % — ABNORMAL LOW (ref 12–46)
Lymphs Abs: 1.4 10*3/uL (ref 0.7–4.0)
Monocytes Relative: 8 % (ref 3–12)

## 2010-12-18 NOTE — Discharge Summary (Signed)
[name], [name]                 ACCOUNT NO.:  [id number]  MEDICAL RECORD NO.:  [id number]  LOCATION:  1412                         FACILITY:  [facility]  PHYSICIAN:  Talmage Nap, MD  DATE OF BIRTH:  10-15-1928  DATE OF ADMISSION:  12/14/2010 DATE OF DISCHARGE:  12/18/2010                        DISCHARGE SUMMARY - REFERRING   PRIMARY CARE PHYSICIAN:  Robert L. Foy [place], MD  DISCHARGE DIAGNOSES: 1. Right lower lobe pneumonia. 2. Chronic obstructive pulmonary disease exacerbation. 3. Congestive heart failure (diastolic dysfunction). 4. Coronary artery disease, status post pacemaker defibrillator. 5. History of nonischemic cardiomyopathy. 6. Hypothyroidism. 7. Asthma. 8. Hyponatremia, etiology is multifactorial, i.e., secondary to     diuresis as well as questionable inappropriate antidiuretic hormone     secretion.  HISTORY:  The patient is an 77 year old Caucasian female with history of congestive heart failure; diastolic dysfunction; and coronary artery disease, status post pacemaker defibrillator, who was admitted to the hospital on December 14, 2010, by Dr. Fran Lowes with 4-day history of shortness of breath that was getting progressively worse.  This was said to be associated with fever, chills, and poor appetite.  The patient was also said to have cough that was productive of purulent sputum.  She denied any associated chest pain.  No nausea or vomiting.  Subsequently, the patient presented to the hospital to be evaluated.  PREADMISSION MEDICATIONS:  Include; 1. Coreg 12.5 mg 1 p.o. b.i.d. 2. Oxycodone/acetaminophen 5/325 one p.o. q.6 p.r.n. 3. Alendronate 70 mg p.o. q. Weekly. 4. Enteric-coated aspirin 81 mg p.o. daily. 5. Lasix 80 mg 2 p.o. q.h.s. 6. Cozaar 100 mg 1 p.o. q.a.m. 7. Synthroid 175 mcg 1 p.o. q.a.m. 8. Digoxin 0.25 mg p.o. daily.  ALLERGIES:  She has no known allergies.  PAST SURGICAL HISTORY: 1. Coronary artery disease, status post pacemaker  defibrillator. 2. Cholecystectomy. 3. Total abdominal hysterectomy, most likely secondary to ovarian CA. 4. Left wrist surgery. 5. Bilateral shoulder surgery.  FAMILY HISTORY:  Essentially as documented in the initial history and physical.  SOCIAL HISTORY:  Essentially as documented in the initial history and physical.  REVIEW OF SYSTEMS:  Essentially as documented in the initial history and physical.  At the time, the patient was seen by the admitting physician.  PHYSICAL EXAMINATION:  VITAL SIGNS:  Temperature 98.8, pulse 62, respiratory rate 20, blood pressure is 194/95, and she was saturating 94% on room air. GENERAL:  She was said to be awake, alert, oriented; not in any acute respiratory distress. HEENT:  Pupils were reactive to light and extraocular muscles were intact. NECK:  She has no jugular venous distention.  No carotid bruit.  No lymphadenopathy. CHEST:  Showed scattered rhonchi, inspiratory and expiratory bilaterally with decreased breath sounds, bibasilar and also rales, right lung base. HEART:  Sounds are 1 and 2.  No murmur. ABDOMEN:  Said to be obese, nontender.  Liver and spleen tip not palpable, and positive mention of the left hypogastric hernia.  Bowel sounds are positive. EXTREMITIES:  Show +2 to 3 pedal edema. NEUROLOGIC:  Exam was nonfocal. MUSCULOSKELETAL SYSTEM:  Showed arthritic changes in the knees and in the feet, and neuropsychiatric evaluation was unremarkable.  LABORATORY DATA:  Initial complete blood count with no differential showed WBC of 14.1, hemoglobin 11.9, hematocrit 33.2, MCV 88.1, platelet count of 197.  Three set of cardiac markers; troponin I less than 0.30, less than 0.30, and less than 0.30 respectively.  ProBNP is 937.3. Basic metabolic panel showed sodium of 118, potassium of 4.3, chloride of 82, bicarbonate is 26, glucose is 156, BUN is 8, creatinine less than 0.47.  Arterial blood gas done on the patient showed pH of  7.441, pCO2 of 41.2, pO2 of 83.9, and bicarbonate of 27.3.  Digoxin level is 0.9. Magnesium level is 2.0, phosphorus level is 2.9.  Blood culture x2, no growth.  A repeat complete blood count with differential done on December 18, 2010, showed WBC of 14.0, hemoglobin of 12.2, hematocrit of 34.3, MCV of 88.9, platelet count of 297.  Magnesium level is 2.4. Comprehensive metabolic panel showed sodium of 124, potassium of 3.6, chloride of 85 with a bicarbonate of 32, glucose is 168, BUN is 14, creatinine is 0.51.  LFTs are normal.  Imaging studies done include chest x-ray on admission which showed severe opacification at the right base medially, it also shows infiltrate/pneumonia; and a repeat chest x- ray done on December 16, 2010, showed improved right base airspace disease.  There is residual infection or atelectasis remaining.  A repeat portable chest x-ray done on December 17, 2010, showed resolution of the right lower lobe infiltrate and there is minimal residual atelectasis also improved.  HOSPITAL COURSE:  The patient was admitted to telemetry with an impression of community-acquired pneumonia, COPD exacerbation, and respiratory failure; and she was placed on O2 via nasal cannula 2 to 3 L per minute and was started on albuterol/Atrovent nebs q.6 hourly.  The patient was also given Robitussin 10 cc p.o. q.4 p.m. for cough and was started on IV Rocephin 500 mg q.24 and IV azithromycin 500 mg IV q.24. She was also started on Solu-Medrol 60 mg IV q.6 hourly and Lasix 80 mg q.12 hours.  The patient was seen by me for the very first time in this admission on December 15, 2010, and during this encounter examination, she showed rales and rhonchi in the lung fields.  However, because of the hyponatremia, demeclocycline 150 mg p.o. b.i.d. was added to the patient's regimen and the dose of Lasix was reduced to 40 mg IV q.12 hours.  The patient also continued to complain of postnasal drip  with throat discomfort and Claritin-D 10 mg p.o. q.h.s. was added to regimen and also added to the patient's regimen was potassium chloride 10 mEq p.o. b.i.d.  The patient was subsequently followed and evaluated by me on a daily basis and she made remarkable progress.  Breathing improved remarkably.  The postnasal drip with throat discomfort resolved and examination of the chest showed remarkable resolution of the infiltrates and rhonchi.  She was seen by me today which is December 18, 2010. Denied any specific complaints, very eager to go home.  Examination showed minimal bibasilar rales.  Vital signs; blood pressure is 140/90, pulse 62, respiratory rate 14, temperature is 97.9, medically stable. Overall plan is for the patient to be discharged home today on Levaquin and tapered doses of steroids.  The patient was advised to watch out for signs and symptoms of congestive heart failure which include shortness of breath, congestion in the lungs; and should she develop any of these symptoms, she will return to the hospital to be evaluated.  She was also advised on fluid restriction,  daily weight.  Medication to be taken at home will include the following; 1. Demeclocycline 150 mg p.o. b.i.d. 2. Levaquin 500 mg 1 p.o. daily for the next 7 days. 3. Loratadine 10 mg p.o. daily at bedtime. 4. Potassium chloride 10 mEq p.o. b.i.d. 5. Prednisone tapered doses starting with 60 mg p.o. daily x1, 50 mg     p.o. daily x1, 40 mg p.o. daily x1, 30 mg p.o. daily x1, 20 mg p.o.     daily x1, 10 mg p.o. daily x1 and thereafter discontinued. 6. Alendronate 70 mg p.o. q. weekly on Sundays. 7. Enteric-coated aspirin 81 mg p.o. daily. 8. Boswellia serrata supplement OTC 1 capsule p.o. daily. 9. Calcium carbonate/vitamin D one p.o. daily. 10.Cord liver oil capsule over the counter 1 capsule p.o. daily. 11.CoQ10 supplement OTC 1 p.o. daily. 12.Coreg 12.5 mg 1 p.o. q.a.m. 13.Cozaar (losartan) 100 mg p.o.  q.a.m. 14.Digoxin 0.2 mg p.o. q.a.m. 15.Furosemide 80 mg 1/2 a tablet p.o. daily. 16.Levothyroxine/Synthroid 137 mcg 1 p.o. daily. 17.Levothroid 175 mcg 1 p.o. q.a.m. 18.Oxycodone/acetaminophen 5/325 one p.o. t.i.d. 19.Vitamin C of 500 mg 1 p.o. daily.     Talmage Nap, MD     CN/MEDQ  D:  12/18/2010  T:  12/18/2010  Job:  [id number]  Electronically Signed by Talmage Nap  on 12/18/2010 05:14:39 PM

## 2010-12-21 LAB — CULTURE, BLOOD (ROUTINE X 2)
Culture  Setup Time: 201209180137
Culture: NO GROWTH

## 2011-04-10 DIAGNOSIS — I472 Ventricular tachycardia: Secondary | ICD-10-CM | POA: Diagnosis not present

## 2011-04-10 DIAGNOSIS — I428 Other cardiomyopathies: Secondary | ICD-10-CM | POA: Diagnosis not present

## 2011-05-08 DIAGNOSIS — I428 Other cardiomyopathies: Secondary | ICD-10-CM | POA: Diagnosis not present

## 2011-05-08 DIAGNOSIS — I472 Ventricular tachycardia: Secondary | ICD-10-CM | POA: Diagnosis not present

## 2011-05-19 DIAGNOSIS — Z1331 Encounter for screening for depression: Secondary | ICD-10-CM | POA: Diagnosis not present

## 2011-05-19 DIAGNOSIS — G622 Polyneuropathy due to other toxic agents: Secondary | ICD-10-CM | POA: Diagnosis not present

## 2011-05-19 DIAGNOSIS — J189 Pneumonia, unspecified organism: Secondary | ICD-10-CM | POA: Diagnosis not present

## 2011-05-19 DIAGNOSIS — R062 Wheezing: Secondary | ICD-10-CM | POA: Diagnosis not present

## 2011-05-27 DIAGNOSIS — I428 Other cardiomyopathies: Secondary | ICD-10-CM | POA: Diagnosis not present

## 2011-05-27 DIAGNOSIS — I10 Essential (primary) hypertension: Secondary | ICD-10-CM | POA: Diagnosis not present

## 2011-07-15 DIAGNOSIS — R34 Anuria and oliguria: Secondary | ICD-10-CM | POA: Diagnosis not present

## 2011-07-15 DIAGNOSIS — R0602 Shortness of breath: Secondary | ICD-10-CM | POA: Diagnosis not present

## 2011-07-15 DIAGNOSIS — G622 Polyneuropathy due to other toxic agents: Secondary | ICD-10-CM | POA: Diagnosis not present

## 2011-09-22 DIAGNOSIS — M2550 Pain in unspecified joint: Secondary | ICD-10-CM | POA: Diagnosis not present

## 2011-09-22 DIAGNOSIS — G622 Polyneuropathy due to other toxic agents: Secondary | ICD-10-CM | POA: Diagnosis not present

## 2011-09-22 DIAGNOSIS — J449 Chronic obstructive pulmonary disease, unspecified: Secondary | ICD-10-CM | POA: Diagnosis not present

## 2011-11-05 ENCOUNTER — Encounter: Payer: Self-pay | Admitting: Pulmonary Disease

## 2011-11-05 ENCOUNTER — Ambulatory Visit (INDEPENDENT_AMBULATORY_CARE_PROVIDER_SITE_OTHER): Payer: Medicare Other | Admitting: Pulmonary Disease

## 2011-11-05 VITALS — BP 152/80 | HR 66 | Temp 98.2°F | Ht 62.0 in | Wt 169.4 lb

## 2011-11-05 DIAGNOSIS — R0609 Other forms of dyspnea: Secondary | ICD-10-CM | POA: Insufficient documentation

## 2011-11-05 DIAGNOSIS — H11009 Unspecified pterygium of unspecified eye: Secondary | ICD-10-CM | POA: Diagnosis not present

## 2011-11-05 DIAGNOSIS — M2550 Pain in unspecified joint: Secondary | ICD-10-CM | POA: Diagnosis not present

## 2011-11-05 DIAGNOSIS — R0989 Other specified symptoms and signs involving the circulatory and respiratory systems: Secondary | ICD-10-CM

## 2011-11-05 NOTE — Addendum Note (Signed)
Addended by: Michel Bickers A on: 11/05/2011 11:56 AM   Modules accepted: Orders

## 2011-11-05 NOTE — Assessment & Plan Note (Signed)
The patient has significant dyspnea on exertion with the least little activity.  She has a known cardiomyopathy, is overweight, and is very unstable on her feet with significant muscular weakness.  It is unclear whether she may have a pulmonary issue, but will do full pulmonary function studies to evaluate.  She did not desaturate below 90% today with slow walking, despite being very short of breath.  We'll see her back after her breathing studies to discuss with her in detail.

## 2011-11-05 NOTE — Progress Notes (Signed)
  Subjective:    Patient ID: [name], female    DOB: 1928/05/22, 78 y.o.   MRN: [id number]  HPI The patient is a very pleasant 78 year old female who I've been asked to see for dyspnea.  She has had dyspnea on exertion for at least a year, and feels that her symptoms are progressive.  She had a right lower lobe pneumonia in September of last year, but her followup chest x-ray showed complete clearing shortly thereafter.  She has a known cardiomyopathy, with an AICD in place.  The patient states that she will get winded just walking through her house, and is unable to bring groceries in from the car without significant shortness of breath.  She denies any cough, congestion, or mucus production.  She has never smoked, and has never had pulmonary function studies.  She has no history of asthma during her youth.  She has had increased lower extremity edema, and states that her weight has increased "a little".  Her last chest x-ray was in February of this year, and was clear.  The patient states that she is very unstable on her feet, with muscle weakness and balance issues.   Review of Systems  Constitutional: Positive for appetite change. Negative for fever and unexpected weight change.  HENT: Positive for congestion, rhinorrhea, sneezing and postnasal drip. Negative for ear pain, nosebleeds, sore throat, trouble swallowing, dental problem and sinus pressure.   Eyes: Negative.  Negative for redness and itching.  Respiratory: Positive for shortness of breath and wheezing. Negative for cough and chest tightness.   Cardiovascular: Positive for leg swelling. Negative for palpitations.       Hand and feet swelling  Gastrointestinal: Negative.  Negative for nausea and vomiting.  Genitourinary: Negative.  Negative for dysuria.  Musculoskeletal: Positive for joint swelling.  Skin: Negative for rash.  Neurological: Negative.  Negative for headaches.  Hematological: Negative.  Does not bruise/bleed easily.   Psychiatric/Behavioral: Negative.  Negative for dysphoric mood. The patient is not nervous/anxious.        Objective:   Physical Exam Constitutional:  Overweight, weak female, no acute distress  HENT:  Nares patent without discharge, but narrowed bilat.  Oropharynx without exudate, palate and uvula are normal  Eyes:  Perrla, eomi, no scleral icterus  Neck:  No JVD, no TMG  Cardiovascular:  Normal rate, regular rhythm, no rubs or gallops.  3/6 rumble.        Intact distal pulses but diminished.  Pulmonary :  Normal breath sounds, no stridor or respiratory distress   Mild basilar crackles, no rhonchi, intermittent upper airway pseudowheezing.  Abdominal:  Soft, nondistended, bowel sounds present.  No tenderness noted.   Musculoskeletal: 2+ lower extremity edema noted.  Lymph Nodes:  No cervical lymphadenopathy noted  Skin:  No cyanosis noted  Neurologic:  Alert, appropriate, moves all 4 extremities without obvious deficit.         Assessment & Plan:

## 2011-11-05 NOTE — Patient Instructions (Addendum)
Will schedule for breathing tests in next few weeks, and see back the same day to review with you. Work on weight reduction.

## 2011-11-16 DIAGNOSIS — I428 Other cardiomyopathies: Secondary | ICD-10-CM | POA: Diagnosis not present

## 2011-11-16 DIAGNOSIS — R0602 Shortness of breath: Secondary | ICD-10-CM | POA: Diagnosis not present

## 2011-11-22 DIAGNOSIS — G622 Polyneuropathy due to other toxic agents: Secondary | ICD-10-CM | POA: Diagnosis not present

## 2011-11-22 DIAGNOSIS — M2550 Pain in unspecified joint: Secondary | ICD-10-CM | POA: Diagnosis not present

## 2011-12-03 ENCOUNTER — Ambulatory Visit (INDEPENDENT_AMBULATORY_CARE_PROVIDER_SITE_OTHER): Payer: Medicare Other | Admitting: Pulmonary Disease

## 2011-12-03 ENCOUNTER — Encounter: Payer: Self-pay | Admitting: Pulmonary Disease

## 2011-12-03 VITALS — BP 158/78 | HR 51 | Temp 98.7°F | Ht 62.0 in | Wt 167.0 lb

## 2011-12-03 DIAGNOSIS — R0989 Other specified symptoms and signs involving the circulatory and respiratory systems: Secondary | ICD-10-CM | POA: Diagnosis not present

## 2011-12-03 DIAGNOSIS — J449 Chronic obstructive pulmonary disease, unspecified: Secondary | ICD-10-CM

## 2011-12-03 DIAGNOSIS — R0609 Other forms of dyspnea: Secondary | ICD-10-CM

## 2011-12-03 LAB — PULMONARY FUNCTION TEST

## 2011-12-03 NOTE — Assessment & Plan Note (Addendum)
CXR 04/2011:  No acute process. Ambulatory Ox 10/2011:  desat only to 90%. PFT"s 11/2011:  FEV1 0.94 (60%), ratio 64, +airtrapping, +++response to BD, no restriction, DLCO mildly reduced.    The patient has mild to moderate airflow obstruction on her recent PFTs, with a very good bronchodilator response.  I suspect she has occult asthma with a fixed and reversible component, although I cannot exclude a component of senile emphysema given her age.  I would like to try her on a LABA/ICS, especially with her significant bronchodilator response, and see how she responds.  I suspect this is only a part of her overall dyspnea on exertion, but unsure how much it contributes.

## 2011-12-03 NOTE — Progress Notes (Signed)
  Subjective:    Patient ID: [name], female    DOB: 1928-05-23, 78 y.o.   MRN: [id number]  HPI The patient comes in today for followup of her dyspnea on exertion.  She had PFTs today that showed mild to moderate airflow obstruction, and a significant improvement with bronchodilators.  She was also noted to have no restriction, but did have air trapping.  Her DLCO was mildly reduced, but corrected with alveolar volume adjustment.  I have reviewed this study with her in detail, and answered all of her questions.   Review of Systems  Constitutional: Negative for fever and unexpected weight change.  HENT: Positive for congestion and rhinorrhea. Negative for ear pain, nosebleeds, sore throat, sneezing, trouble swallowing, dental problem, postnasal drip and sinus pressure.   Eyes: Negative for redness and itching.  Respiratory: Positive for shortness of breath. Negative for cough, chest tightness and wheezing.   Cardiovascular: Positive for leg swelling. Negative for palpitations.  Gastrointestinal: Negative for nausea and vomiting.  Genitourinary: Negative for dysuria.  Musculoskeletal: Positive for joint swelling.  Skin: Negative for rash.  Neurological: Negative for headaches.  Hematological: Bruises/bleeds easily.  Psychiatric/Behavioral: Negative for dysphoric mood. The patient is nervous/anxious.        Objective:   Physical Exam Obese female in no acute distress Nose without purulence or discharge noted Chest with no active wheezing or rhonchi Cardiac exam with regular rhythm Lower extremities with 1+ edema bilaterally, no cyanosis Alert and oriented, moves all 4 extremities.       Assessment & Plan:

## 2011-12-03 NOTE — Progress Notes (Signed)
PFT done today. 

## 2011-12-03 NOTE — Patient Instructions (Addendum)
Will start on dulera 100/5  2 puffs am and pm everyday.  Rinse mouth well.  Take this whether you think you need or not. Use your albuterol for emergencies only followup with me in 4 weeks, and will see if this has helped.

## 2011-12-29 DIAGNOSIS — R0602 Shortness of breath: Secondary | ICD-10-CM | POA: Diagnosis not present

## 2011-12-31 ENCOUNTER — Encounter: Payer: Self-pay | Admitting: Pulmonary Disease

## 2011-12-31 ENCOUNTER — Other Ambulatory Visit: Payer: Self-pay | Admitting: *Deleted

## 2011-12-31 ENCOUNTER — Ambulatory Visit (INDEPENDENT_AMBULATORY_CARE_PROVIDER_SITE_OTHER): Payer: Medicare Other | Admitting: Pulmonary Disease

## 2011-12-31 VITALS — BP 150/88 | HR 63 | Temp 98.6°F | Ht 62.0 in | Wt 167.6 lb

## 2011-12-31 DIAGNOSIS — R0609 Other forms of dyspnea: Secondary | ICD-10-CM | POA: Diagnosis not present

## 2011-12-31 DIAGNOSIS — Z23 Encounter for immunization: Secondary | ICD-10-CM

## 2011-12-31 DIAGNOSIS — R0989 Other specified symptoms and signs involving the circulatory and respiratory systems: Secondary | ICD-10-CM | POA: Diagnosis not present

## 2011-12-31 DIAGNOSIS — J449 Chronic obstructive pulmonary disease, unspecified: Secondary | ICD-10-CM | POA: Diagnosis not present

## 2011-12-31 MED ORDER — MOMETASONE FURO-FORMOTEROL FUM 100-5 MCG/ACT IN AERO: 2.0000 | INHALATION_SPRAY | Freq: Two times a day (BID) | RESPIRATORY_TRACT | Status: DC

## 2011-12-31 NOTE — Assessment & Plan Note (Signed)
The patient has seen a significant response to dulera, and I have asked her to continue on this.  I suspect this is latent asthma, and therefore she would need to maintain on some type of inhaled corticosteroid.  I have also asked her to work on some type of conditioning program, and she tells me that she has an exercise bike that she has not been using recently.  She will also need to keep a close eye on her volume status, and let her cardiologist or primary care physician know if things are worsening.

## 2011-12-31 NOTE — Patient Instructions (Addendum)
Stay on dulera, and keep mouth rinsed.  We will send a 3 mos prescription to your mail order pharmacy.  Work on weight reduction and some type of exercise. Will give you the flu shot today. followup with me in 6mos

## 2011-12-31 NOTE — Progress Notes (Signed)
  Subjective:    Patient ID: [name], female    DOB: [birth year], 78 y.o.   MRN: [id number]  HPI The patient comes in today for followup of her known obstructive airways disease, felt secondary to latent asthma or possibly senile emphysema.  The patient was started on dulera at the last visit, and has seen significant improvement in her breathing.  She rarely has to use her rescue inhaler at this point.  She still has some dyspnea on exertion, but I have reminded her that her underlying cardiac disease and also her weight and conditioning play roles with this as well.   Review of Systems  Constitutional: Negative for fever and unexpected weight change.  HENT: Positive for rhinorrhea. Negative for ear pain, nosebleeds, congestion, sore throat, sneezing, trouble swallowing, dental problem, postnasal drip and sinus pressure.   Eyes: Negative for redness and itching.  Respiratory: Positive for cough, shortness of breath and wheezing. Negative for chest tightness.   Cardiovascular: Positive for leg swelling. Negative for palpitations.  Gastrointestinal: Negative for nausea and vomiting.  Genitourinary: Negative for dysuria.  Musculoskeletal: Positive for joint swelling.  Skin: Negative for rash.  Neurological: Negative for headaches.  Hematological: Does not bruise/bleed easily.  Psychiatric/Behavioral: Negative for dysphoric mood. The patient is not nervous/anxious.        Objective:   Physical Exam Overweight female in no acute distress Nose without purulence or discharge noted Chest with mildly decreased breath sounds, no wheezing Cardiac exam was regular rate and rhythm Lower extremities with 1+ edema, no cyanosis Alert and oriented, moves all 4 extremities.       Assessment & Plan:

## 2012-01-03 DIAGNOSIS — M545 Low back pain: Secondary | ICD-10-CM | POA: Diagnosis not present

## 2012-01-03 DIAGNOSIS — G609 Hereditary and idiopathic neuropathy, unspecified: Secondary | ICD-10-CM | POA: Diagnosis not present

## 2012-01-03 DIAGNOSIS — R5381 Other malaise: Secondary | ICD-10-CM | POA: Diagnosis not present

## 2012-01-03 DIAGNOSIS — M2550 Pain in unspecified joint: Secondary | ICD-10-CM | POA: Diagnosis not present

## 2012-01-03 DIAGNOSIS — R5383 Other fatigue: Secondary | ICD-10-CM | POA: Diagnosis not present

## 2012-02-11 DIAGNOSIS — M2550 Pain in unspecified joint: Secondary | ICD-10-CM | POA: Diagnosis not present

## 2012-02-11 DIAGNOSIS — M1990 Unspecified osteoarthritis, unspecified site: Secondary | ICD-10-CM | POA: Diagnosis not present

## 2012-02-11 DIAGNOSIS — M064 Inflammatory polyarthropathy: Secondary | ICD-10-CM | POA: Diagnosis not present

## 2012-02-11 DIAGNOSIS — Z79899 Other long term (current) drug therapy: Secondary | ICD-10-CM | POA: Diagnosis not present

## 2012-02-27 DIAGNOSIS — I472 Ventricular tachycardia: Secondary | ICD-10-CM | POA: Diagnosis not present

## 2012-02-27 DIAGNOSIS — I428 Other cardiomyopathies: Secondary | ICD-10-CM | POA: Diagnosis not present

## 2012-04-02 DIAGNOSIS — I428 Other cardiomyopathies: Secondary | ICD-10-CM | POA: Diagnosis not present

## 2012-04-02 DIAGNOSIS — I472 Ventricular tachycardia: Secondary | ICD-10-CM | POA: Diagnosis not present

## 2012-04-30 DIAGNOSIS — I428 Other cardiomyopathies: Secondary | ICD-10-CM | POA: Diagnosis not present

## 2012-04-30 DIAGNOSIS — I472 Ventricular tachycardia: Secondary | ICD-10-CM | POA: Diagnosis not present

## 2012-05-28 DIAGNOSIS — I472 Ventricular tachycardia: Secondary | ICD-10-CM | POA: Diagnosis not present

## 2012-05-28 DIAGNOSIS — I428 Other cardiomyopathies: Secondary | ICD-10-CM | POA: Diagnosis not present

## 2012-06-14 DIAGNOSIS — S59919A Unspecified injury of unspecified forearm, initial encounter: Secondary | ICD-10-CM | POA: Diagnosis not present

## 2012-06-14 DIAGNOSIS — S6990XA Unspecified injury of unspecified wrist, hand and finger(s), initial encounter: Secondary | ICD-10-CM | POA: Diagnosis not present

## 2012-06-28 DIAGNOSIS — S63509A Unspecified sprain of unspecified wrist, initial encounter: Secondary | ICD-10-CM | POA: Diagnosis not present

## 2012-06-28 DIAGNOSIS — J449 Chronic obstructive pulmonary disease, unspecified: Secondary | ICD-10-CM | POA: Diagnosis not present

## 2012-07-03 DIAGNOSIS — I428 Other cardiomyopathies: Secondary | ICD-10-CM | POA: Diagnosis not present

## 2012-07-03 DIAGNOSIS — I472 Ventricular tachycardia: Secondary | ICD-10-CM | POA: Diagnosis not present

## 2012-07-03 LAB — ICD DEVICE OBSERVATION

## 2012-08-15 ENCOUNTER — Encounter: Payer: Self-pay | Admitting: *Deleted

## 2012-08-15 DIAGNOSIS — S6990XA Unspecified injury of unspecified wrist, hand and finger(s), initial encounter: Secondary | ICD-10-CM | POA: Diagnosis not present

## 2012-08-15 DIAGNOSIS — J449 Chronic obstructive pulmonary disease, unspecified: Secondary | ICD-10-CM | POA: Diagnosis not present

## 2012-08-15 DIAGNOSIS — S59919A Unspecified injury of unspecified forearm, initial encounter: Secondary | ICD-10-CM | POA: Diagnosis not present

## 2012-09-03 ENCOUNTER — Other Ambulatory Visit: Payer: Self-pay | Admitting: Cardiovascular Disease

## 2012-09-03 DIAGNOSIS — I428 Other cardiomyopathies: Secondary | ICD-10-CM | POA: Diagnosis not present

## 2012-09-03 LAB — ICD DEVICE OBSERVATION

## 2012-09-08 ENCOUNTER — Encounter: Payer: Self-pay | Admitting: *Deleted

## 2012-09-08 LAB — REMOTE ICD DEVICE
BAMS-0001: 171 {beats}/min
BATTERY VOLTAGE: 2.72 V
BRDY-0003LV: 130 {beats}/min
MODE SWITCH EPISODES: 0
PACEART VT: 0
TZAT-0001SLOWVT: 1
TZAT-0013SLOWVT: 2
TZAT-0018SLOWVT: NEGATIVE
TZON-0003AFLUTTER: 350.8 ms
TZON-0003SLOWVT: 350.8 ms
TZST-0003SLOWVT: 35 J
TZST-0003SLOWVT: 35 J
TZST-0003SLOWVT: 35 J
VF: 0

## 2012-10-04 ENCOUNTER — Encounter: Payer: Self-pay | Admitting: Cardiovascular Disease

## 2012-10-09 ENCOUNTER — Other Ambulatory Visit: Payer: Self-pay | Admitting: Cardiovascular Disease

## 2012-10-09 DIAGNOSIS — I428 Other cardiomyopathies: Secondary | ICD-10-CM | POA: Diagnosis not present

## 2012-10-26 ENCOUNTER — Encounter: Payer: Self-pay | Admitting: *Deleted

## 2012-10-26 LAB — REMOTE ICD DEVICE
BATTERY VOLTAGE: 2.69 V
BRDY-0002LV: 50 {beats}/min
BRDY-0003LV: 130 {beats}/min
FVT: 0
PACEART VT: 0
TZAT-0013SLOWVT: 2
TZAT-0018SLOWVT: NEGATIVE
TZON-0003ATACH: 350.8 ms
TZON-0003SLOWVT: 350.8 ms
TZST-0001SLOWVT: 3
TZST-0001SLOWVT: 4
TZST-0003SLOWVT: 25 J
TZST-0003SLOWVT: 35 J
TZST-0003SLOWVT: 35 J
VF: 0

## 2012-11-14 ENCOUNTER — Other Ambulatory Visit: Payer: Self-pay | Admitting: Cardiovascular Disease

## 2012-11-14 DIAGNOSIS — E782 Mixed hyperlipidemia: Secondary | ICD-10-CM | POA: Diagnosis not present

## 2012-11-14 DIAGNOSIS — Z79899 Other long term (current) drug therapy: Secondary | ICD-10-CM | POA: Diagnosis not present

## 2012-11-14 LAB — COMPREHENSIVE METABOLIC PANEL
ALT: 20 U/L (ref 0–35)
AST: 23 U/L (ref 0–37)
Albumin: 4.1 g/dL (ref 3.5–5.2)
Alkaline Phosphatase: 44 U/L (ref 39–117)
BUN: 13 mg/dL (ref 6–23)
Calcium: 9.2 mg/dL (ref 8.4–10.5)
Chloride: 96 mEq/L (ref 96–112)
Potassium: 4.8 mEq/L (ref 3.5–5.3)
Sodium: 131 mEq/L — ABNORMAL LOW (ref 135–145)
Total Protein: 6.5 g/dL (ref 6.0–8.3)

## 2012-11-14 LAB — LIPID PANEL: HDL: 39 mg/dL — ABNORMAL LOW (ref >39)

## 2012-11-14 LAB — LDL CHOLESTEROL, DIRECT: Direct LDL: 93 mg/dL

## 2012-11-16 ENCOUNTER — Encounter: Payer: Self-pay | Admitting: *Deleted

## 2012-11-20 ENCOUNTER — Telehealth: Payer: Self-pay | Admitting: *Deleted

## 2012-11-20 DIAGNOSIS — E119 Type 2 diabetes mellitus without complications: Secondary | ICD-10-CM

## 2012-11-20 DIAGNOSIS — E782 Mixed hyperlipidemia: Secondary | ICD-10-CM

## 2012-11-20 DIAGNOSIS — Z79899 Other long term (current) drug therapy: Secondary | ICD-10-CM

## 2012-11-20 NOTE — Telephone Encounter (Signed)
Message copied by Vita Barley on Mon Nov 20, 2012  8:46 AM ------      Message from: [place], [place]      Created: Fri Nov 17, 2012  1:10 PM       TG very high, but I would focus on controlling glucose and they will get better. Not sure that adding a medication for treating high TG is safe with all her other problems/meds      Recheck lipids and A1c in 3-4 months ------

## 2012-11-20 NOTE — Telephone Encounter (Signed)
Lab results called and given to husband.  Pt has an appt tomorrow will discuss at office visit and give lab request for repeat labs in 3-4 months.

## 2012-11-21 ENCOUNTER — Ambulatory Visit (INDEPENDENT_AMBULATORY_CARE_PROVIDER_SITE_OTHER): Payer: Medicare Other | Admitting: Cardiovascular Disease

## 2012-11-21 ENCOUNTER — Encounter: Payer: Self-pay | Admitting: Cardiovascular Disease

## 2012-11-21 VITALS — BP 178/98 | HR 63 | Ht 63.0 in | Wt 168.2 lb

## 2012-11-21 DIAGNOSIS — I509 Heart failure, unspecified: Secondary | ICD-10-CM

## 2012-11-21 DIAGNOSIS — J449 Chronic obstructive pulmonary disease, unspecified: Secondary | ICD-10-CM

## 2012-11-21 DIAGNOSIS — Z95810 Presence of automatic (implantable) cardiac defibrillator: Secondary | ICD-10-CM

## 2012-11-21 DIAGNOSIS — G629 Polyneuropathy, unspecified: Secondary | ICD-10-CM

## 2012-11-21 DIAGNOSIS — I2589 Other forms of chronic ischemic heart disease: Secondary | ICD-10-CM | POA: Diagnosis not present

## 2012-11-21 DIAGNOSIS — I255 Ischemic cardiomyopathy: Secondary | ICD-10-CM

## 2012-11-21 DIAGNOSIS — E785 Hyperlipidemia, unspecified: Secondary | ICD-10-CM

## 2012-11-21 DIAGNOSIS — I428 Other cardiomyopathies: Secondary | ICD-10-CM

## 2012-11-21 DIAGNOSIS — R0609 Other forms of dyspnea: Secondary | ICD-10-CM

## 2012-11-21 DIAGNOSIS — G609 Hereditary and idiopathic neuropathy, unspecified: Secondary | ICD-10-CM

## 2012-11-21 DIAGNOSIS — I5042 Chronic combined systolic (congestive) and diastolic (congestive) heart failure: Secondary | ICD-10-CM | POA: Diagnosis not present

## 2012-11-21 NOTE — Patient Instructions (Addendum)
Your physician recommends that you schedule a follow-up appointment in: 12 months. You will receive updates regarding your defibrillator checks every 3 months. Please call our office if you do not receive these. Try to limit her consumption of carbohydrates (sugars and starches). Carbohydrates with a low glycemic index are preferable. These include whole-grain bread/pasta, sweet potatoes, wild rice, etc. Avoid free sugars (sodas, juices, sweets) and starches with a high glycemic index (potatoes, white bread, white pasta, etc.)

## 2012-11-27 DIAGNOSIS — E785 Hyperlipidemia, unspecified: Secondary | ICD-10-CM | POA: Insufficient documentation

## 2012-11-27 DIAGNOSIS — I5032 Chronic diastolic (congestive) heart failure: Secondary | ICD-10-CM | POA: Insufficient documentation

## 2012-11-27 DIAGNOSIS — I428 Other cardiomyopathies: Secondary | ICD-10-CM | POA: Insufficient documentation

## 2012-11-27 DIAGNOSIS — Z95810 Presence of automatic (implantable) cardiac defibrillator: Secondary | ICD-10-CM | POA: Insufficient documentation

## 2012-11-27 DIAGNOSIS — G629 Polyneuropathy, unspecified: Secondary | ICD-10-CM | POA: Insufficient documentation

## 2012-11-27 NOTE — Assessment & Plan Note (Signed)
Normal function. 100% biventricular pacing deficiency. Excellent battery and lead parameters. No history of defibrillator discharges and no recorded tachyarrhythmias. No changes made on the current visit.

## 2012-11-27 NOTE — Assessment & Plan Note (Signed)
Heart to distinguish whether this is secondary to CHF or asthma

## 2012-11-27 NOTE — Assessment & Plan Note (Addendum)
Her ejection fraction was as low as 20% at presentation and completely normalized after implementation of CRT and medical therapy

## 2012-11-27 NOTE — Progress Notes (Signed)
Patient ID: [name], female   DOB: [birth year], 79 y.o.   MRN: [id number]     Reason for office visit Followup congestive heart failure, CRT-D  Mrs. Shrestha is a 79 year old woman with a history of mildly depressed left ventricular systolic function that resolved after implantation of a biventricular pacemaker/defibrillator. She does have echocardiographic evidence of diastolic dysfunction. It is hard to say whether her occasional shortness of breath is related to COPD or heart failure. She has lower extremity edema that generally resolves after overnight recumbency and worsens throughout the day. She denies palpitations or syncope. She has not had any problems with focal neurological deficits. She does desaturate on walking to a minimum of about 90% on room air.   No Known Allergies  Current Outpatient Prescriptions  Medication Sig Dispense Refill  . alendronate (FOSAMAX) 70 MG tablet Take 70 mg by mouth every 7 (seven) days. Take with a full glass of water on an empty stomach. Last taken Sunday 12/13/2010       . aspirin 81 MG chewable tablet Chew 81 mg by mouth daily.        . calcium-vitamin D (OSCAL WITH D) 250-125 MG-UNIT per tablet Take 1 tablet by mouth daily.        . carvedilol (COREG) 12.5 MG tablet Take 12.5 mg by mouth daily.        Marland Kitchen co-enzyme Q-10 50 MG capsule Take 50 mg by mouth daily.        Marland Kitchen Cod Liver Oil CAPS Take 1 capsule by mouth daily.        . digoxin (LANOXIN) 0.25 MG tablet Take 250 mcg by mouth daily.        . furosemide (LASIX) 80 MG tablet Take 40 mg by mouth daily.        Marland Kitchen levothyroxine (SYNTHROID, LEVOTHROID) 137 MCG tablet Take 137 mcg by mouth daily.      Marland Kitchen liothyronine (CYTOMEL) 25 MCG tablet       . losartan (COZAAR) 100 MG tablet Take 100 mg by mouth daily.        . mometasone-formoterol (DULERA) 100-5 MCG/ACT AERO Inhale 2 puffs into the lungs 2 (two) times daily.  3 Inhaler  3  . oxyCODONE (OXYCONTIN) 20 MG 12 hr tablet Take 20 mg by mouth every 12  (twelve) hours.      Marland Kitchen oxyCODONE-acetaminophen (PERCOCET) 5-325 MG per tablet Take 1 tablet by mouth 3 (three) times daily. As needed for foot pain       . PROAIR HFA 108 (90 BASE) MCG/ACT inhaler       . vitamin C (ASCORBIC ACID) 500 MG tablet Take 1,000 mg by mouth daily.        . CELEBREX 200 MG capsule        No current facility-administered medications for this visit.    Past Medical History  Diagnosis Date  . AICD (automatic cardioverter/defibrillator) present 06/27/2007    medtronic  . Thyroid disease   . Cancer     ovarian--stage 4  . Cardiomyopathy   . CHF (congestive heart failure)   . Systemic hypertension   . Peripheral neuropathy     Past Surgical History  Procedure Laterality Date  . Appendectomy    . Cholecystectomy    . Cardiac defibrillator placement  06/27/2007    medtronic concerto  . Abdominal hysterectomy  oct 2000    along with ovarian ca surg  . Cardiac catheterization  09/27/1990    normal coronaries,dilated  cardiomyopathy  . Nm myocar perf wall motion  06/05/2007    no ischemia    Family History  Problem Relation Age of Onset  . Heart disease Paternal Grandmother   . Lung cancer Mother     died from cancer  . Lung cancer Father     History   Social History  . Marital Status: Married    Spouse Name: N/A    Number of Children: N/A  . Years of Education: N/A   Occupational History  . housewife    Social History Main Topics  . Smoking status: Never Smoker   . Smokeless tobacco: Not on file  . Alcohol Use: No  . Drug Use: No  . Sexual Activity:    Other Topics Concern  . Not on file   Social History Narrative  . No narrative on file    Review of systems: The patient specifically denies any chest pain at rest or with exertion, dyspnea at rest, orthopnea, paroxysmal nocturnal dyspnea, syncope, palpitations, focal neurological deficits, intermittent claudication,  unexplained weight gain, cough, hemoptysis or wheezing.  The patient  also denies abdominal pain, nausea, vomiting, dysphagia, diarrhea, constipation, polyuria, polydipsia, dysuria, hematuria, frequency, urgency, abnormal bleeding or bruising, fever, chills, unexpected weight changes, mood swings, change in skin or hair texture, change in voice quality, auditory or visual problems, allergic reactions or rashes, new musculoskeletal complaints other than usual "aches and pains".   PHYSICAL EXAM BP 178/98  Pulse 63  Ht 5\' 3"  (1.6 m)  Wt 168 lb 3.2 oz (76.295 kg)  BMI 29.8 kg/m2 On recheck her blood pressure was 150/86  General: Alert, oriented x3, no distress Head: no evidence of trauma, PERRL, EOMI, no exophtalmos or lid lag, no myxedema, no xanthelasma; normal ears, nose and oropharynx Neck: normal jugular venous pulsations and no hepatojugular reflux; brisk carotid pulses without delay and no carotid bruits Chest: clear to auscultation, no signs of consolidation by percussion or palpation, normal fremitus, symmetrical and full respiratory excursions Cardiovascular: normal position and quality of the apical impulse, regular rhythm, normal first and her toxic we split second heart sounds, no  rubs or gallops, late peaking 1/6 systolic ejection murmur in the aortic focus Abdomen: no tenderness or distention, no masses by palpation, no abnormal pulsatility or arterial bruits, normal bowel sounds, no hepatosplenomegaly Extremities: no clubbing, cyanosis; ventricle 1+ bilateral ankle edema; 2+ radial, ulnar and brachial pulses bilaterally; 2+ right femoral, posterior tibial and dorsalis pedis pulses; 2+ left femoral, posterior tibial and dorsalis pedis pulses; no subclavian or femoral bruits Neurological: grossly nonfocal   EKG: Atrial sensed biventricular paced with a right bundle branch morphology suggesting early LV pacing  100% biventricular pacing by recent device interrogation on July 14  Lipid Panel     Component Value Date/Time   CHOL 201* 11/14/2012 1114     TRIG 415* 11/14/2012 1114   HDL 39* 11/14/2012 1114   CHOLHDL 5.2 11/14/2012 1114   VLDL NOT CALC 11/14/2012 1114   LDLCALC Comment:   Not calculated due to Triglyceride >400. Suggest ordering Direct LDL (Unit Code: 16109).   Total Cholesterol/HDL Ratio:CHD Risk                        Coronary Heart Disease Risk Table  Men       Women          1/2 Average Risk              3.4        3.3              Average Risk              5.0        4.4           2X Average Risk              9.6        7.1           3X Average Risk             23.4       11.0 Use the calculated Patient Ratio above and the CHD Risk table  to determine the patient's CHD Risk. ATP III Classification (LDL):       < 100        mg/dL         Optimal      161 - 129     mg/dL         Near or Above Optimal      130 - 159     mg/dL         Borderline High      160 - 189     mg/dL         High       > 096        mg/dL         Very High   0/45/4098 1114    BMET    Component Value Date/Time   NA 131* 11/14/2012 1114   K 4.8 11/14/2012 1114   CL 96 11/14/2012 1114   CO2 28 11/14/2012 1114   GLUCOSE 92 11/14/2012 1114   BUN 13 11/14/2012 1114   CREATININE 0.81 11/14/2012 1114   CREATININE 0.51 12/18/2010 0456   CALCIUM 9.2 11/14/2012 1114   GFRNONAA >60 12/18/2010 0456   GFRAA >60 12/18/2010 0456     ASSESSMENT AND PLAN Chronic combined systolic and diastolic CHF, NYHA class 2 Appears well compensated with only mild ankle edema that could just as well be related to cor pulmonale S2 left heart vessel dysfunction. No changes were made to her medications.  Biventricular ICD Medtronic Concerto implanted 2009 Normal function. 100% biventricular pacing deficiency. Excellent battery and lead parameters. No history of defibrillator discharges and no recorded tachyarrhythmias. No changes made on the current visit.  Nonischemic cardiomyopathy Her ejection fraction was as low as 20% at presentation and completely  normalized after implementation of CRT and medical therapy  COPD (chronic obstructive pulmonary disease) Significant obstructive disease with an FEV1 that is roughly 60% of predicted but with good response to bronchodilator suggesting an important component of asthma  DOE (dyspnea on exertion) Heart to distinguish whether this is secondary to CHF or asthma  Hyperlipidemia Predominantly hypertriglyceridemia with other features suggestive of insulin resistance, diet and lifestyle changes discussed in detail.   Orders Placed This Encounter  Procedures  . EKG 12-Lead    Avner Stroder  Thurmon Fair, MD, [facility] [phone] office [phone] pager

## 2012-11-27 NOTE — Assessment & Plan Note (Signed)
Predominantly hypertriglyceridemia with other features suggestive of insulin resistance, diet and lifestyle changes discussed in detail.

## 2012-11-27 NOTE — Assessment & Plan Note (Signed)
Significant obstructive disease with an FEV1 that is roughly 60% of predicted but with good response to bronchodilator suggesting an important component of asthma

## 2012-11-27 NOTE — Assessment & Plan Note (Signed)
Appears well compensated with only mild ankle edema that could just as well be related to cor pulmonale S2 left heart vessel dysfunction. No changes were made to her medications.

## 2012-12-24 DIAGNOSIS — I428 Other cardiomyopathies: Secondary | ICD-10-CM

## 2012-12-24 DIAGNOSIS — I509 Heart failure, unspecified: Secondary | ICD-10-CM

## 2012-12-24 LAB — ICD DEVICE OBSERVATION

## 2012-12-25 ENCOUNTER — Ambulatory Visit: Payer: Medicare Other

## 2012-12-27 ENCOUNTER — Encounter: Payer: Self-pay | Admitting: *Deleted

## 2012-12-27 LAB — REMOTE ICD DEVICE
ATRIAL PACING ICD: 1.1 pct
BAMS-0001: 171 {beats}/min
CHARGE TIME: 12.5 s
LV LEAD IMPEDENCE ICD: 440 Ohm
MODE SWITCH EPISODES: 0
RV LEAD IMPEDENCE ICD: 784 Ohm
RV LEAD THRESHOLD: 1.5 V
TZAT-0001SLOWVT: 1
TZAT-0001SLOWVT: 2
TZAT-0013SLOWVT: 2
TZAT-0018SLOWVT: NEGATIVE
TZON-0003AFLUTTER: 350.8 ms
TZON-0003VSLOWVT: 441.1 ms
TZON-0004SLOWVT: 16
TZON-0004VSLOWVT: 20
TZON-0005SLOWVT: 12
TZST-0001SLOWVT: 5
TZST-0003SLOWVT: 25 J
TZST-0003SLOWVT: 35 J
TZST-0003SLOWVT: 35 J
VENTRICULAR PACING ICD: 100 pct

## 2013-01-04 DIAGNOSIS — Z23 Encounter for immunization: Secondary | ICD-10-CM | POA: Diagnosis not present

## 2013-01-09 ENCOUNTER — Encounter: Payer: Self-pay | Admitting: Cardiovascular Disease

## 2013-01-28 LAB — ICD DEVICE OBSERVATION

## 2013-01-29 ENCOUNTER — Ambulatory Visit (INDEPENDENT_AMBULATORY_CARE_PROVIDER_SITE_OTHER): Payer: Medicare Other

## 2013-01-29 DIAGNOSIS — I428 Other cardiomyopathies: Secondary | ICD-10-CM

## 2013-01-29 DIAGNOSIS — I5042 Chronic combined systolic (congestive) and diastolic (congestive) heart failure: Secondary | ICD-10-CM

## 2013-01-29 DIAGNOSIS — I509 Heart failure, unspecified: Secondary | ICD-10-CM

## 2013-01-29 LAB — MDC_IDC_ENUM_SESS_TYPE_REMOTE
Battery Voltage: 2.64 V
Brady Statistic AP VP Percent: 0.56 %
Brady Statistic AP VS Percent: 0.01 %
Brady Statistic AS VP Percent: 99.43 %
Brady Statistic RA Percent Paced: 0.57 %
Date Time Interrogation Session: 20141102072606
HighPow Impedance: 64 Ohm
Lead Channel Impedance Value: 472 Ohm
Lead Channel Impedance Value: 720 Ohm
Lead Channel Sensing Intrinsic Amplitude: 5.5176
Lead Channel Setting Pacing Amplitude: 2 V
Lead Channel Setting Pacing Pulse Width: 0.4 ms
Lead Channel Setting Sensing Sensitivity: 0.3 mV
Zone Setting Detection Interval: 300 ms

## 2013-02-01 DIAGNOSIS — J441 Chronic obstructive pulmonary disease with (acute) exacerbation: Secondary | ICD-10-CM | POA: Diagnosis not present

## 2013-02-13 ENCOUNTER — Encounter: Payer: Self-pay | Admitting: *Deleted

## 2013-02-15 DIAGNOSIS — E039 Hypothyroidism, unspecified: Secondary | ICD-10-CM | POA: Diagnosis not present

## 2013-02-15 DIAGNOSIS — G619 Inflammatory polyneuropathy, unspecified: Secondary | ICD-10-CM | POA: Diagnosis not present

## 2013-02-15 DIAGNOSIS — R29818 Other symptoms and signs involving the nervous system: Secondary | ICD-10-CM | POA: Diagnosis not present

## 2013-02-15 DIAGNOSIS — M2550 Pain in unspecified joint: Secondary | ICD-10-CM | POA: Diagnosis not present

## 2013-02-15 DIAGNOSIS — G8929 Other chronic pain: Secondary | ICD-10-CM | POA: Diagnosis not present

## 2013-02-20 DIAGNOSIS — R29818 Other symptoms and signs involving the nervous system: Secondary | ICD-10-CM | POA: Diagnosis not present

## 2013-02-20 DIAGNOSIS — R269 Unspecified abnormalities of gait and mobility: Secondary | ICD-10-CM | POA: Diagnosis not present

## 2013-02-26 ENCOUNTER — Other Ambulatory Visit: Payer: Self-pay | Admitting: Cardiovascular Disease

## 2013-02-26 NOTE — Telephone Encounter (Signed)
Rx was sent to pharmacy electronically. 

## 2013-02-27 DIAGNOSIS — R269 Unspecified abnormalities of gait and mobility: Secondary | ICD-10-CM | POA: Diagnosis not present

## 2013-02-27 DIAGNOSIS — R29818 Other symptoms and signs involving the nervous system: Secondary | ICD-10-CM | POA: Diagnosis not present

## 2013-02-28 ENCOUNTER — Ambulatory Visit (INDEPENDENT_AMBULATORY_CARE_PROVIDER_SITE_OTHER): Payer: Medicare Other | Admitting: *Deleted

## 2013-02-28 DIAGNOSIS — I5042 Chronic combined systolic (congestive) and diastolic (congestive) heart failure: Secondary | ICD-10-CM

## 2013-02-28 DIAGNOSIS — I428 Other cardiomyopathies: Secondary | ICD-10-CM

## 2013-02-28 DIAGNOSIS — I509 Heart failure, unspecified: Secondary | ICD-10-CM | POA: Diagnosis not present

## 2013-02-28 LAB — MDC_IDC_ENUM_SESS_TYPE_REMOTE
Brady Statistic RA Percent Paced: 0.37 %
Brady Statistic RV Percent Paced: 99.99 %
HighPow Impedance: 58 Ohm
Lead Channel Pacing Threshold Pulse Width: 0.4 ms
Lead Channel Setting Pacing Amplitude: 2 V
Lead Channel Setting Pacing Amplitude: 2.5 V
Lead Channel Setting Pacing Amplitude: 3 V
Lead Channel Setting Pacing Pulse Width: 0.4 ms
Lead Channel Setting Pacing Pulse Width: 0.8 ms
Zone Setting Detection Interval: 350 ms
Zone Setting Detection Interval: 350 ms
Zone Setting Detection Interval: 440 ms

## 2013-02-28 LAB — ICD DEVICE OBSERVATION

## 2013-03-01 DIAGNOSIS — R29818 Other symptoms and signs involving the nervous system: Secondary | ICD-10-CM | POA: Diagnosis not present

## 2013-03-01 DIAGNOSIS — R269 Unspecified abnormalities of gait and mobility: Secondary | ICD-10-CM | POA: Diagnosis not present

## 2013-03-06 ENCOUNTER — Other Ambulatory Visit: Payer: Self-pay | Admitting: Cardiovascular Disease

## 2013-03-06 DIAGNOSIS — E119 Type 2 diabetes mellitus without complications: Secondary | ICD-10-CM | POA: Diagnosis not present

## 2013-03-06 DIAGNOSIS — Z79899 Other long term (current) drug therapy: Secondary | ICD-10-CM | POA: Diagnosis not present

## 2013-03-06 DIAGNOSIS — E782 Mixed hyperlipidemia: Secondary | ICD-10-CM | POA: Diagnosis not present

## 2013-03-06 LAB — COMPREHENSIVE METABOLIC PANEL
AST: 23 U/L (ref 0–37)
Albumin: 4.1 g/dL (ref 3.5–5.2)
Alkaline Phosphatase: 55 U/L (ref 39–117)
BUN: 14 mg/dL (ref 6–23)
Creat: 0.75 mg/dL (ref 0.50–1.10)
Glucose, Bld: 95 mg/dL (ref 70–99)
Potassium: 5.1 mEq/L (ref 3.5–5.3)

## 2013-03-06 LAB — LIPID PANEL
Cholesterol: 238 mg/dL — ABNORMAL HIGH (ref 0–200)
HDL: 42 mg/dL (ref >39)
Total CHOL/HDL Ratio: 5.7 Ratio
Triglycerides: 458 mg/dL — ABNORMAL HIGH (ref <150)

## 2013-03-06 LAB — LDL CHOLESTEROL, DIRECT: Direct LDL: 111 mg/dL — ABNORMAL HIGH

## 2013-03-06 LAB — HEMOGLOBIN A1C: Mean Plasma Glucose: 123 mg/dL — ABNORMAL HIGH (ref <117)

## 2013-03-07 ENCOUNTER — Telehealth: Payer: Self-pay | Admitting: *Deleted

## 2013-03-07 ENCOUNTER — Other Ambulatory Visit: Payer: Self-pay | Admitting: Cardiovascular Disease

## 2013-03-07 DIAGNOSIS — E782 Mixed hyperlipidemia: Secondary | ICD-10-CM

## 2013-03-07 DIAGNOSIS — Z79899 Other long term (current) drug therapy: Secondary | ICD-10-CM

## 2013-03-07 MED ORDER — FENOFIBRATE 145 MG PO TABS: 145.0000 mg | ORAL_TABLET | Freq: Every day | ORAL | Status: DC

## 2013-03-07 NOTE — Telephone Encounter (Signed)
Message copied by Vita Barley on Wed Mar 07, 2013  9:31 AM ------      Message from: Thurmon Fair      Created: Wed Mar 07, 2013  9:02 AM       Despite excellent glucose control, triglycerides are very high. Recommend she start fenofibrate. I put med order in ------

## 2013-03-07 NOTE — Telephone Encounter (Signed)
Patient called back and is changing insurance 03/29/2013.  She is in the donut hole and can not afford another med so she requested I cancel and order after the 1st of the year to their new mail order (doesn't know the name at this time).  [facility] called and cancelled Fenofibrate order.  Will call us after 03/29/13 to let us know.

## 2013-03-07 NOTE — Telephone Encounter (Signed)
Patient notified of lab results and instructed to start Fenofibrate 145mg  QD.  Dr. Salena Saner placed order to [facility].  Repeat lab work in April 2015 order mailed to patient.

## 2013-03-15 ENCOUNTER — Encounter: Payer: Self-pay | Admitting: *Deleted

## 2013-03-15 ENCOUNTER — Telehealth: Payer: Self-pay | Admitting: *Deleted

## 2013-03-15 NOTE — Telephone Encounter (Signed)
I informed patient that her ICD was nearing RRT. I told her that an appointment would be made with [facility] to discuss the risks and benefits whenever the time comes. Patient aware that her device will alarm whenever it is at RRT. Patient voiced understanding and appreciated information given.

## 2013-04-02 ENCOUNTER — Encounter: Payer: Medicare Other | Admitting: *Deleted

## 2013-04-11 ENCOUNTER — Encounter: Payer: Self-pay | Admitting: *Deleted

## 2013-04-20 ENCOUNTER — Encounter: Payer: Medicare Other | Admitting: *Deleted

## 2013-04-23 ENCOUNTER — Telehealth: Payer: Self-pay | Admitting: Cardiovascular Disease

## 2013-04-23 NOTE — Telephone Encounter (Signed)
Also needs new prescriptions sent over to [facility] .[name] She is no longer with [facility].. Losartan , Carvedilol, Digoxan and Flurosimide .[name] Thanks

## 2013-04-24 MED ORDER — LOSARTAN POTASSIUM 100 MG PO TABS: 100.0000 mg | ORAL_TABLET | Freq: Every day | ORAL | Status: DC

## 2013-04-24 MED ORDER — CARVEDILOL 12.5 MG PO TABS: 12.5000 mg | ORAL_TABLET | Freq: Every day | ORAL | Status: DC

## 2013-04-24 MED ORDER — FUROSEMIDE 80 MG PO TABS
40.0000 mg | ORAL_TABLET | Freq: Every day | ORAL | Status: DC
Start: 2013-04-24 — End: 2013-09-28

## 2013-04-24 MED ORDER — DIGOXIN 250 MCG PO TABS
0.2500 mg | ORAL_TABLET | ORAL | Status: DC
Start: 2013-04-24 — End: 2013-09-28

## 2013-04-24 NOTE — Telephone Encounter (Signed)
Refill(s) sent to pharmacy: [facility].

## 2013-04-26 DIAGNOSIS — J45901 Unspecified asthma with (acute) exacerbation: Secondary | ICD-10-CM | POA: Diagnosis not present

## 2013-05-08 ENCOUNTER — Encounter: Payer: Self-pay | Admitting: Internal Medicine

## 2013-06-06 ENCOUNTER — Encounter: Payer: Self-pay | Admitting: Internal Medicine

## 2013-06-06 ENCOUNTER — Ambulatory Visit (INDEPENDENT_AMBULATORY_CARE_PROVIDER_SITE_OTHER): Payer: Medicare Other | Admitting: Internal Medicine

## 2013-06-06 VITALS — BP 186/77 | HR 60 | Ht 63.0 in | Wt 157.0 lb

## 2013-06-06 DIAGNOSIS — Z4502 Encounter for adjustment and management of automatic implantable cardiac defibrillator: Secondary | ICD-10-CM | POA: Diagnosis not present

## 2013-06-06 DIAGNOSIS — I428 Other cardiomyopathies: Secondary | ICD-10-CM | POA: Diagnosis not present

## 2013-06-06 DIAGNOSIS — I5042 Chronic combined systolic (congestive) and diastolic (congestive) heart failure: Secondary | ICD-10-CM

## 2013-06-06 DIAGNOSIS — I509 Heart failure, unspecified: Secondary | ICD-10-CM

## 2013-06-06 DIAGNOSIS — Z95810 Presence of automatic (implantable) cardiac defibrillator: Secondary | ICD-10-CM | POA: Diagnosis not present

## 2013-06-06 NOTE — Progress Notes (Signed)
Patient Care Team: Abigail Miyamoto, MD as PCP - General (Family Medicine)   HPI  [name] is a 80 y.o. female Seen in followup for CRT-D    The device was implanted 1610 complicated by a left ventricular lead dislodgment. Apparently there has been intercurrent normalization of LV function. Her device has reached ERI. She's been followed by Dr. Jerilynn Mages. CR   Past Medical History  Diagnosis Date  . AICD (automatic cardioverter/defibrillator) present 06/27/2007    medtronic  . Thyroid disease   . Cancer     ovarian--stage 4  . Cardiomyopathy   . CHF (congestive heart failure)   . Systemic hypertension   . Peripheral neuropathy     Past Surgical History  Procedure Laterality Date  . Appendectomy    . Cholecystectomy    . Cardiac defibrillator placement  06/27/2007    medtronic concerto  . Abdominal hysterectomy  oct 2000    along with ovarian ca surg  . Cardiac catheterization  09/27/1990    normal coronaries,dilated cardiomyopathy  . Nm myocar perf wall motion  06/05/2007    no ischemia    Current Outpatient Prescriptions  Medication Sig Dispense Refill  . alendronate (FOSAMAX) 70 MG tablet Take 70 mg by mouth every 7 (seven) days. Take with a full glass of water on an empty stomach. Last taken Sunday 12/13/2010       . aspirin 81 MG chewable tablet Chew 81 mg by mouth daily.        . calcium-vitamin D (OSCAL WITH D) 250-125 MG-UNIT per tablet Take 1 tablet by mouth daily.        . carvedilol (COREG) 12.5 MG tablet Take 1 tablet (12.5 mg total) by mouth daily.  90 tablet  1  . co-enzyme Q-10 50 MG capsule Take 50 mg by mouth daily.        Marland Kitchen Cod Liver Oil CAPS Take 1 capsule by mouth daily.        . digoxin (DIGOX) 0.25 MG tablet Take 1 tablet (0.25 mg total) by mouth every morning. Need appointment for refills.  90 tablet  1  . furosemide (LASIX) 80 MG tablet Take 0.5 tablets (40 mg total) by mouth daily.  45 tablet  1  . levothyroxine (SYNTHROID, LEVOTHROID) 137 MCG  tablet Take 137 mcg by mouth daily.      Marland Kitchen liothyronine (CYTOMEL) 25 MCG tablet       . losartan (COZAAR) 100 MG tablet Take 1 tablet (100 mg total) by mouth daily.  90 tablet  1  . mometasone-formoterol (DULERA) 100-5 MCG/ACT AERO Inhale 2 puffs into the lungs 2 (two) times daily.  3 Inhaler  3  . oxyCODONE-acetaminophen (PERCOCET) 5-325 MG per tablet Take 1 tablet by mouth 3 (three) times daily. As needed for foot pain       . PROAIR HFA 108 (90 BASE) MCG/ACT inhaler       . vitamin C (ASCORBIC ACID) 500 MG tablet Take 1,000 mg by mouth daily.         No current facility-administered medications for this visit.    No Known Allergies  Review of Systems negative except from HPI and PMH  Physical Exam BP 186/77  Pulse 60  Ht 5\' 3"  (1.6 m)  Wt 157 lb (71.215 kg)  BMI 27.82 kg/m2 Well developed and nourished in no acute distress HENT normal Neck supple with JVP-flat Clear Regular rate and rhythm, no murmurs or gallops Abd-soft  with active BS No Clubbing cyanosis edema Skin-warm and dry A & Oriented  Grossly normal sensory and motor function The patient's device was interrogated.  The information was reviewed. No changes were made in the programming.        ASSESSMENT AND PLAN  Chronic combined systolic and diastolic CHF, NYHA class 2   .  Biventricular ICD Medtronic Concerto implanted 6734    Nonischemic cardiomyopathy     her device has reached ERI. We have discussed the risks of generator replacement primarily related to infection. We have discussed ICD versus pacemaker replacement in the context of CRT; she would like to proceed with CRT-D implantation.  this is reasonable  I have contacted Dr. Thermon Leyland and he will followup with the patient  We have inactivated her alarm

## 2013-06-08 ENCOUNTER — Ambulatory Visit: Payer: PRIVATE HEALTH INSURANCE | Admitting: Cardiovascular Disease

## 2013-06-08 DIAGNOSIS — E039 Hypothyroidism, unspecified: Secondary | ICD-10-CM | POA: Diagnosis not present

## 2013-06-27 DIAGNOSIS — E782 Mixed hyperlipidemia: Secondary | ICD-10-CM | POA: Diagnosis not present

## 2013-06-27 DIAGNOSIS — M899 Disorder of bone, unspecified: Secondary | ICD-10-CM | POA: Diagnosis not present

## 2013-06-27 DIAGNOSIS — Z79899 Other long term (current) drug therapy: Secondary | ICD-10-CM | POA: Diagnosis not present

## 2013-06-27 LAB — COMPREHENSIVE METABOLIC PANEL
ALT: 16 U/L (ref 0–35)
AST: 21 U/L (ref 0–37)
Albumin: 4 g/dL (ref 3.5–5.2)
Alkaline Phosphatase: 53 U/L (ref 39–117)
BUN: 17 mg/dL (ref 6–23)
CO2: 26 mEq/L (ref 19–32)
Calcium: 8.7 mg/dL (ref 8.4–10.5)
Chloride: 95 mEq/L — ABNORMAL LOW (ref 96–112)
Creat: 0.81 mg/dL (ref 0.50–1.10)
Glucose, Bld: 105 mg/dL — ABNORMAL HIGH (ref 70–99)
Potassium: 4.4 mEq/L (ref 3.5–5.3)
Sodium: 127 mEq/L — ABNORMAL LOW (ref 135–145)
Total Bilirubin: 0.6 mg/dL (ref 0.2–1.2)
Total Protein: 6.5 g/dL (ref 6.0–8.3)

## 2013-06-27 LAB — LIPID PANEL
Cholesterol: 164 mg/dL (ref 0–200)
HDL: 37 mg/dL — ABNORMAL LOW (ref >39)
LDL Cholesterol: 59 mg/dL (ref 0–99)
Total CHOL/HDL Ratio: 4.4 Ratio
Triglycerides: 338 mg/dL — ABNORMAL HIGH (ref <150)
VLDL: 68 mg/dL — ABNORMAL HIGH (ref 0–40)

## 2013-07-17 ENCOUNTER — Ambulatory Visit (INDEPENDENT_AMBULATORY_CARE_PROVIDER_SITE_OTHER): Payer: Medicare Other | Admitting: Cardiovascular Disease

## 2013-07-17 ENCOUNTER — Encounter: Payer: Self-pay | Admitting: Cardiovascular Disease

## 2013-07-17 VITALS — BP 184/72 | HR 68 | Ht 63.0 in | Wt 156.1 lb

## 2013-07-17 DIAGNOSIS — I509 Heart failure, unspecified: Secondary | ICD-10-CM | POA: Diagnosis not present

## 2013-07-17 DIAGNOSIS — R0609 Other forms of dyspnea: Secondary | ICD-10-CM | POA: Diagnosis not present

## 2013-07-17 DIAGNOSIS — R5381 Other malaise: Secondary | ICD-10-CM | POA: Diagnosis not present

## 2013-07-17 DIAGNOSIS — R0989 Other specified symptoms and signs involving the circulatory and respiratory systems: Secondary | ICD-10-CM

## 2013-07-17 DIAGNOSIS — Z4502 Encounter for adjustment and management of automatic implantable cardiac defibrillator: Secondary | ICD-10-CM

## 2013-07-17 DIAGNOSIS — I5042 Chronic combined systolic (congestive) and diastolic (congestive) heart failure: Secondary | ICD-10-CM | POA: Diagnosis not present

## 2013-07-17 DIAGNOSIS — R5383 Other fatigue: Secondary | ICD-10-CM | POA: Diagnosis not present

## 2013-07-17 DIAGNOSIS — Z79899 Other long term (current) drug therapy: Secondary | ICD-10-CM

## 2013-07-17 DIAGNOSIS — I428 Other cardiomyopathies: Secondary | ICD-10-CM

## 2013-07-17 DIAGNOSIS — D689 Coagulation defect, unspecified: Secondary | ICD-10-CM

## 2013-07-17 LAB — PACEMAKER DEVICE OBSERVATION

## 2013-07-17 NOTE — Patient Instructions (Addendum)
Your physician has recommended that you have a Medtronic ICD generator change out. This is done at [place] office will contact you tomorrow morning with all the details and hospital instructions.  Your physician recommends that you return for lab work in: within 7 days prior to the hospital procedure.

## 2013-07-18 NOTE — Progress Notes (Signed)
Patient ID: [name], female   DOB: 10-27-1928, 80 y.o.   MRN: [id number]      Reason for office visit CRT-D device at [facility]  [name] has a history of nonischemic cardiomyopathy and had a reported left ventricular ejection fraction as low as 20% in the past. She had normal coronary arteries by catheterization in 1992. After implementation of resynchronization pacemaker therapy in 2009 her ejection fraction has returned to virtually to normal. She has a biventricular pacemaker/defibrillator in place that reached ERI in early March. She is here to discuss device generator change out. She is not pacemaker dependent. She has COPD and she attributes chronic shortness of breath with minimal exertion to this disorder. Hyperlipidemia and a history of neuropathy secondary to chemotherapy.   No Known Allergies  Current Outpatient Prescriptions  Medication Sig Dispense Refill  . alendronate (FOSAMAX) 70 MG tablet Take 70 mg by mouth every 7 (seven) days. Take with a full glass of water on an empty stomach. Last taken Sunday 12/13/2010       . aspirin 81 MG chewable tablet Chew 81 mg by mouth daily.        . calcium-vitamin D (OSCAL WITH D) 250-125 MG-UNIT per tablet Take 1 tablet by mouth daily.        . carvedilol (COREG) 12.5 MG tablet Take 1 tablet (12.5 mg total) by mouth daily.  90 tablet  1  . co-enzyme Q-10 50 MG capsule Take 50 mg by mouth daily.        Marland Kitchen Cod Liver Oil CAPS Take 1 capsule by mouth daily.        . digoxin (DIGOX) 0.25 MG tablet Take 1 tablet (0.25 mg total) by mouth every morning. Need appointment for refills.  90 tablet  1  . furosemide (LASIX) 80 MG tablet Take 0.5 tablets (40 mg total) by mouth daily.  45 tablet  1  . levothyroxine (SYNTHROID, LEVOTHROID) 137 MCG tablet Take 137 mcg by mouth daily.      Marland Kitchen liothyronine (CYTOMEL) 25 MCG tablet       . losartan (COZAAR) 100 MG tablet Take 1 tablet (100 mg total) by mouth daily.  90 tablet  1  . mometasone-formoterol  (DULERA) 100-5 MCG/ACT AERO Inhale 2 puffs into the lungs 2 (two) times daily.  3 Inhaler  3  . oxyCODONE-acetaminophen (PERCOCET) 5-325 MG per tablet Take 1 tablet by mouth 3 (three) times daily. As needed for foot pain       . PROAIR HFA 108 (90 BASE) MCG/ACT inhaler       . vitamin C (ASCORBIC ACID) 500 MG tablet Take 1,000 mg by mouth daily.         No current facility-administered medications for this visit.    Past Medical History  Diagnosis Date  . AICD (automatic cardioverter/defibrillator) present 06/27/2007    medtronic  . Thyroid disease   . Cancer     ovarian--stage 4  . Cardiomyopathy   . CHF (congestive heart failure)   . Systemic hypertension   . Peripheral neuropathy     Past Surgical History  Procedure Laterality Date  . Appendectomy    . Cholecystectomy    . Cardiac defibrillator placement  06/27/2007    medtronic concerto  . Abdominal hysterectomy  oct 2000    along with ovarian ca surg  . Cardiac catheterization  09/27/1990    normal coronaries,dilated cardiomyopathy  . Nm myocar perf wall motion  06/05/2007    no  ischemia    Family History  Problem Relation Age of Onset  . Heart disease Paternal Grandmother   . Lung cancer Mother     died from cancer  . Lung cancer Father     History   Social History  . Marital Status: Married    Spouse Name: N/A    Number of Children: N/A  . Years of Education: N/A   Occupational History  . housewife    Social History Main Topics  . Smoking status: Never Smoker   . Smokeless tobacco: Not on file  . Alcohol Use: No  . Drug Use: No  . Sexual Activity:    Other Topics Concern  . Not on file   Social History Narrative  . No narrative on file    Review of systems: The patient specifically denies any chest pain at rest or with exertion, dyspnea at rest, orthopnea, paroxysmal nocturnal dyspnea, syncope, palpitations, focal neurological deficits, intermittent claudication, lower extremity edema, unexplained  weight gain, cough, hemoptysis or wheezing.  The patient also denies abdominal pain, nausea, vomiting, dysphagia, diarrhea, constipation, polyuria, polydipsia, dysuria, hematuria, frequency, urgency, abnormal bleeding or bruising, fever, chills, unexpected weight changes, mood swings, change in skin or hair texture, change in voice quality, auditory or visual problems, allergic reactions or rashes, new musculoskeletal complaints other than usual "aches and pains".   PHYSICAL EXAM BP 184/72  Pulse 68  Ht 5\' 3"  (1.6 m)  Wt 156 lb 1.6 oz (70.806 kg)  BMI 27.66 kg/m2  General: Alert, oriented x3, no distress Head: no evidence of trauma, PERRL, EOMI, no exophtalmos or lid lag, no myxedema, no xanthelasma; normal ears, nose and oropharynx Neck: normal jugular venous pulsations and no hepatojugular reflux; brisk carotid pulses without delay and no carotid bruits Chest: clear to auscultation, no signs of consolidation by percussion or palpation, normal fremitus, symmetrical and full respiratory excursions. From a surgical standpoint the defibrillator site appears very healthy, but the device has migrated down into her breast to some degree Cardiovascular: normal position and quality of the apical impulse, regular rhythm, normal first and second heart sounds, no murmurs, rubs or gallops Abdomen: no tenderness or distention, no masses by palpation, no abnormal pulsatility or arterial bruits, normal bowel sounds, no hepatosplenomegaly Extremities: no clubbing, cyanosis or edema; 2+ radial, ulnar and brachial pulses bilaterally; 2+ right femoral, posterior tibial and dorsalis pedis pulses; 2+ left femoral, posterior tibial and dorsalis pedis pulses; no subclavian or femoral bruits Neurological: grossly nonfocal   EKG: Atrial sensed, biventricular paced, right bundle branch block left axis deviation morphology  Lipid Panel     Component Value Date/Time   CHOL 164 06/27/2013 1027   TRIG 338* 06/27/2013  1027   HDL 37* 06/27/2013 1027   CHOLHDL 4.4 06/27/2013 1027   VLDL 68* 06/27/2013 1027   LDLCALC 59 06/27/2013 1027    BMET    Component Value Date/Time   NA 127* 06/27/2013 1027   K 4.4 06/27/2013 1027   CL 95* 06/27/2013 1027   CO2 26 06/27/2013 1027   GLUCOSE 105* 06/27/2013 1027   BUN 17 06/27/2013 1027   CREATININE 0.81 06/27/2013 1027   CREATININE 0.51 12/18/2010 0456   CALCIUM 8.7 06/27/2013 1027   GFRNONAA >60 12/18/2010 0456   GFRAA >60 12/18/2010 0456     ASSESSMENT AND PLAN  We discussed the pros and cons/risks and benefits of the device changeout. Barring unexpected complications she will require a simple generator replacement without lead revision. The risk  of infection was reviewed in detail as the most worrisome complication of change outs. Her left ventricular ejection fraction most recently was estimated to be 48% by nuclear scintigraphy. There is an option for a downgrade to CRT-P., but I think she prefers to have the added security of the defibrillator.  Orders Placed This Encounter  Procedures  . CBC  . APTT  . Protime-INR  . Basic metabolic panel  . EKG 12-Lead  . IMPLANTABLE CARDIOVERTER DEFIBRILLATOR (ICD) GENERATOR CHANGE   No orders of the defined types were placed in this encounter.    [name]  Sanda Klein, MD, [facility] [phone] office [phone] pager

## 2013-07-19 LAB — MDC_IDC_ENUM_SESS_TYPE_REMOTE
Battery Voltage: 2.62 V
Brady Statistic AP VS Percent: 0.01 %
Brady Statistic AS VS Percent: 0 %
Brady Statistic RV Percent Paced: 99.99 %
Date Time Interrogation Session: 20150210071513
HighPow Impedance: 47 Ohm
HighPow Impedance: 60 Ohm
Lead Channel Impedance Value: 448 Ohm
Lead Channel Pacing Threshold Amplitude: 0.5 V
Lead Channel Pacing Threshold Amplitude: 1.5 V
Lead Channel Pacing Threshold Pulse Width: 0.4 ms
Lead Channel Pacing Threshold Pulse Width: 0.6 ms
Lead Channel Sensing Intrinsic Amplitude: 18.5 mV
Lead Channel Sensing Intrinsic Amplitude: 3.3 mV
Lead Channel Setting Pacing Amplitude: 2 V
Lead Channel Setting Pacing Amplitude: 2.5 V
Lead Channel Setting Sensing Sensitivity: 0.3 mV
MDC IDC MSMT LEADCHNL LV IMPEDANCE VALUE: 408 Ohm
MDC IDC MSMT LEADCHNL RV IMPEDANCE VALUE: 768 Ohm
MDC IDC MSMT LEADCHNL RV PACING THRESHOLD AMPLITUDE: 2 V
MDC IDC MSMT LEADCHNL RV PACING THRESHOLD PULSEWIDTH: 1 ms
MDC IDC SET LEADCHNL LV PACING PULSEWIDTH: 0.4 ms
MDC IDC SET LEADCHNL RV PACING AMPLITUDE: 3 V
MDC IDC SET LEADCHNL RV PACING PULSEWIDTH: 0.8 ms
MDC IDC SET ZONE DETECTION INTERVAL: 350 ms
MDC IDC SET ZONE DETECTION INTERVAL: 440 ms
MDC IDC STAT BRADY AP VP PERCENT: 2.2 %
MDC IDC STAT BRADY AS VP PERCENT: 97.79 %
MDC IDC STAT BRADY RA PERCENT PACED: 1.6 %
Zone Setting Detection Interval: 300 ms
Zone Setting Detection Interval: 350 ms

## 2013-07-20 ENCOUNTER — Encounter (HOSPITAL_COMMUNITY): Payer: Self-pay | Admitting: Pharmacy Technician

## 2013-07-26 DIAGNOSIS — D689 Coagulation defect, unspecified: Secondary | ICD-10-CM | POA: Diagnosis not present

## 2013-07-26 DIAGNOSIS — R5381 Other malaise: Secondary | ICD-10-CM | POA: Diagnosis not present

## 2013-07-26 DIAGNOSIS — R5383 Other fatigue: Secondary | ICD-10-CM | POA: Diagnosis not present

## 2013-07-26 DIAGNOSIS — Z79899 Other long term (current) drug therapy: Secondary | ICD-10-CM | POA: Diagnosis not present

## 2013-07-26 LAB — CBC
HCT: 38.1 % (ref 36.0–46.0)
Hemoglobin: 13.4 g/dL (ref 12.0–15.0)
MCH: 31.5 pg (ref 26.0–34.0)
MCHC: 35.2 g/dL (ref 30.0–36.0)
MCV: 89.4 fL (ref 78.0–100.0)
PLATELETS: 192 10*3/uL (ref 150–400)
RBC: 4.26 MIL/uL (ref 3.87–5.11)
RDW: 13.4 % (ref 11.5–15.5)
WBC: 7.4 10*3/uL (ref 4.0–10.5)

## 2013-07-26 LAB — BASIC METABOLIC PANEL
BUN: 16 mg/dL (ref 6–23)
CO2: 27 mEq/L (ref 19–32)
CREATININE: 0.82 mg/dL (ref 0.50–1.10)
Calcium: 9.1 mg/dL (ref 8.4–10.5)
Chloride: 91 mEq/L — ABNORMAL LOW (ref 96–112)
GLUCOSE: 83 mg/dL (ref 70–99)
Potassium: 4.4 mEq/L (ref 3.5–5.3)
Sodium: 129 mEq/L — ABNORMAL LOW (ref 135–145)

## 2013-07-26 LAB — PROTIME-INR
INR: 0.98 (ref <1.50)
Prothrombin Time: 12.9 seconds (ref 11.6–15.2)

## 2013-07-26 LAB — APTT: aPTT: 37 seconds (ref 24–37)

## 2013-07-27 ENCOUNTER — Telehealth: Payer: Self-pay | Admitting: Cardiovascular Disease

## 2013-07-27 NOTE — Telephone Encounter (Signed)
Is calling because youi sent her some papers in the mail and she does not quite understand them and is asking that you give her a call .Marland Kitchen Thanks

## 2013-07-30 ENCOUNTER — Other Ambulatory Visit: Payer: Self-pay | Admitting: *Deleted

## 2013-07-30 DIAGNOSIS — G622 Polyneuropathy due to other toxic agents: Secondary | ICD-10-CM | POA: Diagnosis not present

## 2013-07-30 DIAGNOSIS — Z79899 Other long term (current) drug therapy: Secondary | ICD-10-CM | POA: Diagnosis not present

## 2013-07-30 DIAGNOSIS — I509 Heart failure, unspecified: Secondary | ICD-10-CM | POA: Diagnosis not present

## 2013-07-30 DIAGNOSIS — Z4502 Encounter for adjustment and management of automatic implantable cardiac defibrillator: Secondary | ICD-10-CM

## 2013-07-30 DIAGNOSIS — I447 Left bundle-branch block, unspecified: Secondary | ICD-10-CM | POA: Diagnosis not present

## 2013-07-30 DIAGNOSIS — I10 Essential (primary) hypertension: Secondary | ICD-10-CM | POA: Diagnosis not present

## 2013-07-30 DIAGNOSIS — E785 Hyperlipidemia, unspecified: Secondary | ICD-10-CM | POA: Diagnosis not present

## 2013-07-30 DIAGNOSIS — Z90710 Acquired absence of both cervix and uterus: Secondary | ICD-10-CM | POA: Diagnosis not present

## 2013-07-30 DIAGNOSIS — Z7982 Long term (current) use of aspirin: Secondary | ICD-10-CM | POA: Diagnosis not present

## 2013-07-30 DIAGNOSIS — I428 Other cardiomyopathies: Secondary | ICD-10-CM | POA: Diagnosis not present

## 2013-07-30 DIAGNOSIS — J449 Chronic obstructive pulmonary disease, unspecified: Secondary | ICD-10-CM | POA: Diagnosis not present

## 2013-07-30 DIAGNOSIS — Z8543 Personal history of malignant neoplasm of ovary: Secondary | ICD-10-CM | POA: Diagnosis not present

## 2013-07-30 MED ORDER — SODIUM CHLORIDE 0.9 % IR SOLN
80.0000 mg | Status: DC
  Filled 2013-07-30: qty 2

## 2013-07-30 MED ORDER — CEFAZOLIN SODIUM-DEXTROSE 2-3 GM-% IV SOLR
2.0000 g | INTRAVENOUS | Status: DC
  Filled 2013-07-30: qty 50

## 2013-07-30 NOTE — Telephone Encounter (Signed)
Received instruction for battery change out tomorrow along with a lab order Friday.  But she already had lab work done.  Told patient to disregard the lab order.  Voiced understanding.

## 2013-07-31 ENCOUNTER — Ambulatory Visit (HOSPITAL_COMMUNITY)
Admission: RE | Admit: 2013-07-31 | Discharge: 2013-07-31 | Disposition: A | Discharge status: Home / Self Care | Payer: Medicare Other | Source: Ambulatory Visit | Attending: Cardiovascular Disease | Admitting: Cardiovascular Disease

## 2013-07-31 ENCOUNTER — Encounter (HOSPITAL_COMMUNITY)
Admission: RE | Disposition: A | Discharge status: Home / Self Care | Payer: Self-pay | Source: Ambulatory Visit | Attending: Cardiovascular Disease

## 2013-07-31 DIAGNOSIS — I509 Heart failure, unspecified: Secondary | ICD-10-CM | POA: Insufficient documentation

## 2013-07-31 DIAGNOSIS — G622 Polyneuropathy due to other toxic agents: Secondary | ICD-10-CM | POA: Insufficient documentation

## 2013-07-31 DIAGNOSIS — Z8543 Personal history of malignant neoplasm of ovary: Secondary | ICD-10-CM | POA: Insufficient documentation

## 2013-07-31 DIAGNOSIS — Z7982 Long term (current) use of aspirin: Secondary | ICD-10-CM | POA: Insufficient documentation

## 2013-07-31 DIAGNOSIS — Z95810 Presence of automatic (implantable) cardiac defibrillator: Secondary | ICD-10-CM

## 2013-07-31 DIAGNOSIS — J4489 Other specified chronic obstructive pulmonary disease: Secondary | ICD-10-CM | POA: Insufficient documentation

## 2013-07-31 DIAGNOSIS — Z4502 Encounter for adjustment and management of automatic implantable cardiac defibrillator: Secondary | ICD-10-CM

## 2013-07-31 DIAGNOSIS — I428 Other cardiomyopathies: Secondary | ICD-10-CM | POA: Insufficient documentation

## 2013-07-31 DIAGNOSIS — J449 Chronic obstructive pulmonary disease, unspecified: Secondary | ICD-10-CM | POA: Insufficient documentation

## 2013-07-31 DIAGNOSIS — E785 Hyperlipidemia, unspecified: Secondary | ICD-10-CM | POA: Insufficient documentation

## 2013-07-31 DIAGNOSIS — Z79899 Other long term (current) drug therapy: Secondary | ICD-10-CM | POA: Insufficient documentation

## 2013-07-31 DIAGNOSIS — Z90710 Acquired absence of both cervix and uterus: Secondary | ICD-10-CM | POA: Insufficient documentation

## 2013-07-31 DIAGNOSIS — I5042 Chronic combined systolic (congestive) and diastolic (congestive) heart failure: Secondary | ICD-10-CM

## 2013-07-31 DIAGNOSIS — I10 Essential (primary) hypertension: Secondary | ICD-10-CM | POA: Insufficient documentation

## 2013-07-31 DIAGNOSIS — I447 Left bundle-branch block, unspecified: Secondary | ICD-10-CM | POA: Insufficient documentation

## 2013-07-31 DIAGNOSIS — T451X5A Adverse effect of antineoplastic and immunosuppressive drugs, initial encounter: Secondary | ICD-10-CM | POA: Insufficient documentation

## 2013-07-31 HISTORY — PX: IMPLANTABLE CARDIOVERTER DEFIBRILLATOR GENERATOR CHANGE: SHX5474

## 2013-07-31 LAB — SURGICAL PCR SCREEN
MRSA, PCR: NEGATIVE
STAPHYLOCOCCUS AUREUS: NEGATIVE

## 2013-07-31 SURGERY — IMPLANTABLE CARDIOVERTER DEFIBRILLATOR GENERATOR CHANGE
Anesthesia: LOCAL

## 2013-07-31 MED ORDER — CEFAZOLIN SODIUM-DEXTROSE 2-3 GM-% IV SOLR
INTRAVENOUS | Status: AC
  Filled 2013-07-31: qty 50

## 2013-07-31 MED ORDER — MUPIROCIN 2 % EX OINT
TOPICAL_OINTMENT | Freq: Once | CUTANEOUS | Status: AC
  Administered 2013-07-31: 1 via NASAL
  Filled 2013-07-31: qty 22

## 2013-07-31 MED ORDER — SODIUM CHLORIDE 0.9 % IJ SOLN: 3.0000 mL | INTRAMUSCULAR | Status: DC | PRN

## 2013-07-31 MED ORDER — ACETAMINOPHEN 325 MG PO TABS
325.0000 mg | ORAL_TABLET | ORAL | Status: DC | PRN
  Filled 2013-07-31: qty 2

## 2013-07-31 MED ORDER — MIDAZOLAM HCL 2 MG/2ML IJ SOLN
INTRAMUSCULAR | Status: AC
  Filled 2013-07-31: qty 2

## 2013-07-31 MED ORDER — ONDANSETRON HCL 4 MG/2ML IJ SOLN: 4.0000 mg | Freq: Four times a day (QID) | INTRAMUSCULAR | Status: DC | PRN

## 2013-07-31 MED ORDER — LIDOCAINE HCL (PF) 1 % IJ SOLN
INTRAMUSCULAR | Status: AC
  Filled 2013-07-31: qty 60

## 2013-07-31 MED ORDER — MUPIROCIN 2 % EX OINT
TOPICAL_OINTMENT | CUTANEOUS | Status: AC
  Filled 2013-07-31: qty 22

## 2013-07-31 MED ORDER — SODIUM CHLORIDE 0.9 % IV SOLN
INTRAVENOUS | Status: DC
  Administered 2013-07-31: 16:00:00 via INTRAVENOUS

## 2013-07-31 MED ORDER — SODIUM CHLORIDE 0.9 % IV SOLN
INTRAVENOUS | Status: DC
Start: 2013-07-31 — End: 2013-07-31
  Administered 2013-07-31: 12:00:00 via INTRAVENOUS

## 2013-07-31 MED ORDER — FENTANYL CITRATE 0.05 MG/ML IJ SOLN
INTRAMUSCULAR | Status: AC
  Filled 2013-07-31: qty 2

## 2013-07-31 NOTE — Op Note (Signed)
Procedure report  Procedure performed:  1. Dual chamber CRT-D generator changeout  2. Light sedation  Reason for procedure:  1. Device generator at elective replacement interval  2. CHF, NYHA class II; nonischemic cardiomyopathy; LBBB Procedure performed by:  Sanda Klein, MD  Complications:  None  Estimated blood loss:  <5 mL  Medications administered during procedure:  Ancef 2 g intravenously, lidocaine 1% 30 mL locally, fentanyl 50 mcg intravenously, Versed 2 mg intravenously Device details:   New Generator Medtronic VivaXT CRT-D model number [id number] , serial number [id number] Right atrial lead (chronic) Medtronic [id number], serial [id number] (implanted 06/27/2007) Right ventricular lead (chronic)   Medtronic [id number] dual coil, serial number [id number] (implanted 06/27/2007) Left ventricular lead   Medtronic [id number], serial number [id number] (implanted 06/27/2007)   Explanted generator Medtronic [place],  model number [id number], serial number  [id number] (implanted 06/27/2007)  Procedure details:  After the risks and benefits of the procedure were discussed the patient provided informed consent. She was brought to the cardiac catheter lab in the fasting state. The patient was prepped and draped in usual sterile fashion. Local anesthesia with 1% lidocaine was administered to to the left infraclavicular area. A 5-6cm horizontal incision was made parallel with and 2-3 cm caudal to the left clavicle, in the area of an old scar. An older scar was seen closer to the left clavicle. Using minimal electrocautery and mostly sharp and blunt dissection the prepectoral pocket was opened carefully to avoid injury to the loops of chronic leads. Extensive dissection was not necessary. The device was explanted. The pocket was carefully inspected for hemostasis and flushed with copious amounts of antibiotic solution.  The leads were disconnected from the old generator and testing of the lead  parameters later showed excellent values. The new generator was connected to the chronic leads, with appropriate pacing noted.   The entire system was then carefully inserted in the pocket with care been taking that the leads and device assumed a comfortable position without pressure on the incision. Great care was taken that the leads be located deep to the generator. The pocket was then closed in layers using 2 layers of 2-0 Vicryl and cutaneous staples after which a sterile dressing was applied.   At the end of the procedure the following lead parameters were encountered:   Right atrial lead sensed P waves 2.5 mV, impedance 456 ohms, threshold 0.5 at 0.4 ms pulse width.  Right ventricular lead sensed R waves  >20 mV, impedance 703 ohms, threshold 1.75 at 1.0 ms pulse width.  Left ventricular lead impedance 285 mV, threshold 1.75 at 0.4 ms pulse width.  Sanda Klein, MD, [facility] [phone] office [phone] pager

## 2013-07-31 NOTE — Interval H&P Note (Signed)
History and Physical Interval Note:  07/31/2013 1:52 PM  [name]  has presented today for surgery, with the diagnosis of Battery Depletion  The various methods of treatment have been discussed with the patient and family. After consideration of risks, benefits and other options for treatment, the patient has consented to  Procedure(s): IMPLANTABLE CARDIOVERTER DEFIBRILLATOR GENERATOR CHANGE (N/A) as a surgical intervention .  The patient's history has been reviewed, patient examined, no change in status, stable for surgery.  I have reviewed the patient's chart and labs.  Questions were answered to the patient's satisfaction.     Basha Krygier

## 2013-07-31 NOTE — Discharge Instructions (Signed)
Pacemaker Battery Change, Care After  °Refer to this sheet in the next few weeks. These instructions provide you with information on caring for yourself after your procedure. Your health care provider may also give you more specific instructions. Your treatment has been planned according to current medical practices, but problems sometimes occur. Call your health care provider if you have any problems or questions after your procedure. °WHAT TO EXPECT AFTER THE PROCEDURE °After your procedure, it is typical to have the following sensations: °· Soreness at the pacemaker site. °HOME CARE INSTRUCTIONS  °· Keep the incision clean and dry. °· Unless advised otherwise, you may shower beginning 48 hours after your procedure. °· For the first week after the replacement, avoid stretching motions that pull at the incision site and avoid heavy exercise with the arm on the same side as the incision. °· Only take over-the-counter or prescription medicines for pain, discomfort, or fever as directed by your health care provider. °· Your health care provider will tell you when you will need to next test your pacemaker by telephone or when to return to the office for follow up for removal of stitches. °SEEK MEDICAL CARE IF:  °· You have pain at the incision site that is not relieved by over-the-counter or prescription medicine. °· There is drainage or pus from the incision site. °· There is swelling larger than a lime at the incision site. °· You develop red streaking that extends above or below the incision site. °· You feel brief, intermittent palpitations, lightheadedness, or any symptoms that you feel might be related to your heart. °SEEK IMMEDIATE MEDICAL CARE IF:  °· You experience chest pain that is different than the pain at the pacemaker site. °· Shortness of breath. °· Palpitations or irregular heart beat. °· Lightheadedness that does not go away quickly. °· Fainting. °· You have pain that gets worse and is not relieved by  medicine. °MAKE SURE YOU:  °· Understand these instructions. °· Will watch your condition. °· Will get help right away if you are not doing well or get worse. °Document Released: 01/03/2013 Document Reviewed: 09/27/2012 °Exit[employer]® Patient Information ©2014 [employer]. ° °

## 2013-07-31 NOTE — H&P (View-Only) (Signed)
Patient ID: [name], female   DOB: 10-27-1928, 80 y.o.   MRN: [id number]      Reason for office visit CRT-D device at [facility]  [name] has a history of nonischemic cardiomyopathy and had a reported left ventricular ejection fraction as low as 20% in the past. She had normal coronary arteries by catheterization in 1992. After implementation of resynchronization pacemaker therapy in 2009 her ejection fraction has returned to virtually to normal. She has a biventricular pacemaker/defibrillator in place that reached ERI in early March. She is here to discuss device generator change out. She is not pacemaker dependent. She has COPD and she attributes chronic shortness of breath with minimal exertion to this disorder. Hyperlipidemia and a history of neuropathy secondary to chemotherapy.   No Known Allergies  Current Outpatient Prescriptions  Medication Sig Dispense Refill  . alendronate (FOSAMAX) 70 MG tablet Take 70 mg by mouth every 7 (seven) days. Take with a full glass of water on an empty stomach. Last taken Sunday 12/13/2010       . aspirin 81 MG chewable tablet Chew 81 mg by mouth daily.        . calcium-vitamin D (OSCAL WITH D) 250-125 MG-UNIT per tablet Take 1 tablet by mouth daily.        . carvedilol (COREG) 12.5 MG tablet Take 1 tablet (12.5 mg total) by mouth daily.  90 tablet  1  . co-enzyme Q-10 50 MG capsule Take 50 mg by mouth daily.        Marland Kitchen Cod Liver Oil CAPS Take 1 capsule by mouth daily.        . digoxin (DIGOX) 0.25 MG tablet Take 1 tablet (0.25 mg total) by mouth every morning. Need appointment for refills.  90 tablet  1  . furosemide (LASIX) 80 MG tablet Take 0.5 tablets (40 mg total) by mouth daily.  45 tablet  1  . levothyroxine (SYNTHROID, LEVOTHROID) 137 MCG tablet Take 137 mcg by mouth daily.      Marland Kitchen liothyronine (CYTOMEL) 25 MCG tablet       . losartan (COZAAR) 100 MG tablet Take 1 tablet (100 mg total) by mouth daily.  90 tablet  1  . mometasone-formoterol  (DULERA) 100-5 MCG/ACT AERO Inhale 2 puffs into the lungs 2 (two) times daily.  3 Inhaler  3  . oxyCODONE-acetaminophen (PERCOCET) 5-325 MG per tablet Take 1 tablet by mouth 3 (three) times daily. As needed for foot pain       . PROAIR HFA 108 (90 BASE) MCG/ACT inhaler       . vitamin C (ASCORBIC ACID) 500 MG tablet Take 1,000 mg by mouth daily.         No current facility-administered medications for this visit.    Past Medical History  Diagnosis Date  . AICD (automatic cardioverter/defibrillator) present 06/27/2007    medtronic  . Thyroid disease   . Cancer     ovarian--stage 4  . Cardiomyopathy   . CHF (congestive heart failure)   . Systemic hypertension   . Peripheral neuropathy     Past Surgical History  Procedure Laterality Date  . Appendectomy    . Cholecystectomy    . Cardiac defibrillator placement  06/27/2007    medtronic concerto  . Abdominal hysterectomy  oct 2000    along with ovarian ca surg  . Cardiac catheterization  09/27/1990    normal coronaries,dilated cardiomyopathy  . Nm myocar perf wall motion  06/05/2007    no  ischemia    Family History  Problem Relation Age of Onset  . Heart disease Paternal Grandmother   . Lung cancer Mother     died from cancer  . Lung cancer Father     History   Social History  . Marital Status: Married    Spouse Name: N/A    Number of Children: N/A  . Years of Education: N/A   Occupational History  . housewife    Social History Main Topics  . Smoking status: Never Smoker   . Smokeless tobacco: Not on file  . Alcohol Use: No  . Drug Use: No  . Sexual Activity:    Other Topics Concern  . Not on file   Social History Narrative  . No narrative on file    Review of systems: The patient specifically denies any chest pain at rest or with exertion, dyspnea at rest, orthopnea, paroxysmal nocturnal dyspnea, syncope, palpitations, focal neurological deficits, intermittent claudication, lower extremity edema, unexplained  weight gain, cough, hemoptysis or wheezing.  The patient also denies abdominal pain, nausea, vomiting, dysphagia, diarrhea, constipation, polyuria, polydipsia, dysuria, hematuria, frequency, urgency, abnormal bleeding or bruising, fever, chills, unexpected weight changes, mood swings, change in skin or hair texture, change in voice quality, auditory or visual problems, allergic reactions or rashes, new musculoskeletal complaints other than usual "aches and pains".   PHYSICAL EXAM BP 184/72  Pulse 68  Ht 5' 3" (1.6 m)  Wt 156 lb 1.6 oz (70.806 kg)  BMI 27.66 kg/m2  General: Alert, oriented x3, no distress Head: no evidence of trauma, PERRL, EOMI, no exophtalmos or lid lag, no myxedema, no xanthelasma; normal ears, nose and oropharynx Neck: normal jugular venous pulsations and no hepatojugular reflux; brisk carotid pulses without delay and no carotid bruits Chest: clear to auscultation, no signs of consolidation by percussion or palpation, normal fremitus, symmetrical and full respiratory excursions. From a surgical standpoint the defibrillator site appears very healthy, but the device has migrated down into her breast to some degree Cardiovascular: normal position and quality of the apical impulse, regular rhythm, normal first and second heart sounds, no murmurs, rubs or gallops Abdomen: no tenderness or distention, no masses by palpation, no abnormal pulsatility or arterial bruits, normal bowel sounds, no hepatosplenomegaly Extremities: no clubbing, cyanosis or edema; 2+ radial, ulnar and brachial pulses bilaterally; 2+ right femoral, posterior tibial and dorsalis pedis pulses; 2+ left femoral, posterior tibial and dorsalis pedis pulses; no subclavian or femoral bruits Neurological: grossly nonfocal   EKG: Atrial sensed, biventricular paced, right bundle branch block left axis deviation morphology  Lipid Panel     Component Value Date/Time   CHOL 164 06/27/2013 1027   TRIG 338* 06/27/2013  1027   HDL 37* 06/27/2013 1027   CHOLHDL 4.4 06/27/2013 1027   VLDL 68* 06/27/2013 1027   LDLCALC 59 06/27/2013 1027    BMET    Component Value Date/Time   NA 127* 06/27/2013 1027   K 4.4 06/27/2013 1027   CL 95* 06/27/2013 1027   CO2 26 06/27/2013 1027   GLUCOSE 105* 06/27/2013 1027   BUN 17 06/27/2013 1027   CREATININE 0.81 06/27/2013 1027   CREATININE 0.51 12/18/2010 0456   CALCIUM 8.7 06/27/2013 1027   GFRNONAA >60 12/18/2010 0456   GFRAA >60 12/18/2010 0456     ASSESSMENT AND PLAN  We discussed the pros and cons/risks and benefits of the device changeout. Barring unexpected complications she will require a simple generator replacement without lead revision. The risk   of infection was reviewed in detail as the most worrisome complication of change outs. Her left ventricular ejection fraction most recently was estimated to be 48% by nuclear scintigraphy. There is an option for a downgrade to CRT-P., but I think she prefers to have the added security of the defibrillator.  Orders Placed This Encounter  Procedures  . CBC  . APTT  . Protime-INR  . Basic metabolic panel  . EKG 12-Lead  . IMPLANTABLE CARDIOVERTER DEFIBRILLATOR (ICD) GENERATOR CHANGE   No orders of the defined types were placed in this encounter.    Shauntel Prest  Murrell Elizondo, MD, FACC [facility] [phone] office [phone] pager   

## 2013-07-31 NOTE — Progress Notes (Signed)
ICD Criteria  Current LVEF:55% ;Obtained > 6 months ago.   NYHA Functional Classification: Class II  Heart Failure History:  Yes, Duration of heart failure since onset is > 9 months  Non-Ischemic Dilated Cardiomyopathy History:  Yes, timeframe is > 9 months  Atrial Fibrillation/Atrial Flutter:  No.  Ventricular Tachycardia History:  No.  Cardiac Arrest History:  No  History of Syndromes with Risk of Sudden Death:  No.  Previous ICD:  Yes, ICD Type:  CRT-D, Reason for ICD:  Primary prevention.  20%  Electrophysiology Study: No.  Prior MI: No.  PPM: No.  OSA:  No  Patient Life Expectancy of >=1 year: Yes.  Anticoagulation Therapy:  Patient is NOT on anticoagulation therapy.   Beta Blocker Therapy:  Yes.   Ace Inhibitor/ARB Therapy:  Yes.

## 2013-08-03 ENCOUNTER — Telehealth: Payer: Self-pay | Admitting: Cardiovascular Disease

## 2013-08-06 ENCOUNTER — Encounter: Payer: Self-pay | Admitting: Physician Assistant

## 2013-08-06 ENCOUNTER — Ambulatory Visit (INDEPENDENT_AMBULATORY_CARE_PROVIDER_SITE_OTHER): Payer: Medicare Other | Admitting: Physician Assistant

## 2013-08-06 VITALS — BP 186/94 | HR 74 | Ht 62.0 in | Wt 156.0 lb

## 2013-08-06 DIAGNOSIS — I428 Other cardiomyopathies: Secondary | ICD-10-CM

## 2013-08-06 NOTE — Patient Instructions (Signed)
1.  If the area gets red, swollen, tender or discharges any puss, call our office. 2.  Follow up with Dr. Loletha Grayer  For a pacer check.

## 2013-08-06 NOTE — Progress Notes (Signed)
Date:  08/06/2013   ID:  [name], DOB 1929-01-23, MRN [id number]  PCP:  Katie Miyamoto, MD  Primary Cardiologist:       History of Present Illness: [name] is a 80 y.o. female [name] has a history of nonischemic cardiomyopathy and had a reported left ventricular ejection fraction as low as 20% in Katie past. She had normal coronary arteries by catheterization in 1992. After implementation of resynchronization pacemaker therapy in 2009 her ejection fraction has returned to virtually to normal. She has a biventricular pacemaker/defibrillator in place that reached ERI in early March. She is here to discuss device generator change out. She is not pacemaker dependent. She has COPD and she attributes chronic shortness of breath with minimal exertion to this disorder. Hyperlipidemia and a history of neuropathy secondary to chemotherapy.  New Generator Medtronic VivaXT CRT-D model number [id number] , serial number [id number]  Right atrial lead (chronic) Medtronic [id number], serial [id number] (implanted 06/27/2007)  Right ventricular lead (chronic) Medtronic [id number] dual coil, serial number [id number] (implanted 06/27/2007)  Left ventricular lead Medtronic [id number], serial number [id number] (implanted 06/27/2007)  [name] presents today for wound site check. She is very concerned about how much pain staple removal inflict. She did take one her pain pills prior to coming.  [name] currently denies nausea, vomiting, fever, chest pain, shortness of breath, orthopnea, PND, cough, congestion, abdominal pain, hematochezia, melena, lower extremity edema  Wt Readings from Last 3 Encounters:  08/06/13 156 lb (70.761 kg)  07/31/13 163 lb (73.936 kg)  07/31/13 163 lb (73.936 kg)     Past Medical History  Diagnosis Date  . AICD (automatic cardioverter/defibrillator) present 06/27/2007    medtronic  . Thyroid disease   . Cancer     ovarian--stage 4  . Cardiomyopathy   . CHF (congestive  heart failure)   . Systemic hypertension   . Peripheral neuropathy     Current Outpatient Prescriptions  Medication Sig Dispense Refill  . alendronate (FOSAMAX) 70 MG tablet Take 70 mg by mouth every 7 (seven) days. Take with a full glass of water on an empty stomach. Last taken Sunday 12/13/2010      . aspirin 81 MG chewable tablet Chew 81 mg by mouth daily.        . calcium-vitamin D (OSCAL WITH D) 250-125 MG-UNIT per tablet Take 1 tablet by mouth daily.        . carvedilol (COREG) 12.5 MG tablet Take 1 tablet (12.5 mg total) by mouth daily.  90 tablet  1  . co-enzyme Q-10 50 MG capsule Take 50 mg by mouth daily.        . digoxin (DIGOX) 0.25 MG tablet Take 1 tablet (0.25 mg total) by mouth every morning. Need appointment for refills.  90 tablet  1  . furosemide (LASIX) 80 MG tablet Take 0.5 tablets (40 mg total) by mouth daily.  45 tablet  1  . levothyroxine (SYNTHROID, LEVOTHROID) 137 MCG tablet Take 137 mcg by mouth daily.      Marland Kitchen liothyronine (CYTOMEL) 25 MCG tablet Take 25 mcg by mouth daily.       Marland Kitchen losartan (COZAAR) 100 MG tablet Take 1 tablet (100 mg total) by mouth daily.  90 tablet  1  . mometasone-formoterol (DULERA) 100-5 MCG/ACT AERO Inhale 2 puffs into Katie lungs 2 (two) times daily.  3 Inhaler  3  . Omega-3 Fatty Acids (FISH OIL PO) Take 1 capsule by mouth daily.      Marland Kitchen  oxyCODONE-acetaminophen (PERCOCET) 5-325 MG per tablet Take 1 tablet by mouth every 8 (eight) hours as needed for moderate pain.       . vitamin C (ASCORBIC ACID) 500 MG tablet Take 500 mg by mouth daily.        No current facility-administered medications for this visit.    Allergies:   No Known Allergies  Social History:  [name]  reports that she has never smoked. She does not have any smokeless tobacco history on file. She reports that she does not drink alcohol or use illicit drugs.   Family history:   Family History  Problem Relation Age of Onset  . Heart disease Paternal Grandmother   . Lung  cancer Mother     died from cancer  . Lung cancer Father     ROS:  Please see Katie history of present illness.  All other systems reviewed and negative.   PHYSICAL EXAM: VS:  BP 186/94  Pulse 74  Ht 5\' 2"  (1.575 m)  Wt 156 lb (70.761 kg)  BMI 28.53 kg/m2 Well nourished, well developed, in no acute distress HEENT: Pupils are equal round react to light accommodation extraocular movements are intact.  Cardiac: Regular rate and rhythm with 1/6 systolic murmur Lungs:  clear to auscultation bilaterally, no wheezing, rhonchi or rales Ext: no lower extremity edema.  2+ radial pulses. Skin: warm and dry,  mild erythema at Katie pacer site along Katie incision. Katie edema or erythema noted.  it appears to be healing nicely Neuro:  Grossly normal    ASSESSMENT AND PLAN:  Problem List Items Addressed This Visit   Nonischemic cardiomyopathy - Primary     Status post generator change out. Wound site is intact without signs of infection. Staples were removed. [name] will followup for pacer check.  [name] blood pressure was elevated however she was very anxious about Katie pain it may be inflicted from Katie staple removal.  she is very sensitive.

## 2013-08-06 NOTE — Assessment & Plan Note (Addendum)
Status post generator change out. Wound site is intact without signs of infection. Staples were removed. Patient will followup for pacer check.  Patient blood pressure was elevated however she was very anxious about the pain it may be inflicted from the staple removal.  she is very sensitive.

## 2013-08-08 ENCOUNTER — Telehealth: Payer: Self-pay | Admitting: Cardiovascular Disease

## 2013-08-08 NOTE — Telephone Encounter (Signed)
RN spoke to Patient. She states a medication direction was incorrect on medication list. Patient states she is taking Oxycodone-acetamin. 5/325 every  4-6 hours as needed instead of every 8 hour as needed. She states she has been doing this for years. RN informed patient will update and change on PATIENT'S MEDICATION LIST.

## 2013-08-08 NOTE — Telephone Encounter (Signed)
Pt said she was here a few days ago and realize there is a mistake on her medicine list.Please call.

## 2013-08-16 NOTE — Telephone Encounter (Signed)
Closed encounter °

## 2013-08-24 DIAGNOSIS — H40009 Preglaucoma, unspecified, unspecified eye: Secondary | ICD-10-CM | POA: Diagnosis not present

## 2013-08-24 DIAGNOSIS — H526 Other disorders of refraction: Secondary | ICD-10-CM | POA: Diagnosis not present

## 2013-08-24 DIAGNOSIS — D2310 Other benign neoplasm of skin of unspecified eyelid, including canthus: Secondary | ICD-10-CM | POA: Diagnosis not present

## 2013-08-24 DIAGNOSIS — H2589 Other age-related cataract: Secondary | ICD-10-CM | POA: Diagnosis not present

## 2013-08-24 DIAGNOSIS — H43819 Vitreous degeneration, unspecified eye: Secondary | ICD-10-CM | POA: Diagnosis not present

## 2013-09-11 ENCOUNTER — Encounter: Payer: Self-pay | Admitting: Cardiovascular Disease

## 2013-09-11 ENCOUNTER — Telehealth: Payer: Self-pay | Admitting: Cardiovascular Disease

## 2013-09-14 NOTE — Telephone Encounter (Signed)
Closed encounter °

## 2013-09-28 ENCOUNTER — Other Ambulatory Visit: Payer: Self-pay | Admitting: Cardiovascular Disease

## 2013-10-01 ENCOUNTER — Encounter: Payer: Self-pay | Admitting: *Deleted

## 2013-10-01 NOTE — Telephone Encounter (Signed)
Rx was sent to pharmacy electronically. 

## 2013-10-11 ENCOUNTER — Encounter: Payer: PRIVATE HEALTH INSURANCE | Admitting: Cardiovascular Disease

## 2013-10-16 ENCOUNTER — Encounter: Payer: PRIVATE HEALTH INSURANCE | Admitting: Cardiovascular Disease

## 2013-10-26 DIAGNOSIS — H2589 Other age-related cataract: Secondary | ICD-10-CM | POA: Diagnosis not present

## 2013-10-26 DIAGNOSIS — H43819 Vitreous degeneration, unspecified eye: Secondary | ICD-10-CM | POA: Diagnosis not present

## 2013-10-26 DIAGNOSIS — H40009 Preglaucoma, unspecified, unspecified eye: Secondary | ICD-10-CM | POA: Diagnosis not present

## 2013-10-26 DIAGNOSIS — D2310 Other benign neoplasm of skin of unspecified eyelid, including canthus: Secondary | ICD-10-CM | POA: Diagnosis not present

## 2013-10-26 DIAGNOSIS — H526 Other disorders of refraction: Secondary | ICD-10-CM | POA: Diagnosis not present

## 2013-11-26 DIAGNOSIS — H4011X0 Primary open-angle glaucoma, stage unspecified: Secondary | ICD-10-CM | POA: Diagnosis not present

## 2013-11-26 DIAGNOSIS — H2589 Other age-related cataract: Secondary | ICD-10-CM | POA: Diagnosis not present

## 2013-12-10 DIAGNOSIS — M25559 Pain in unspecified hip: Secondary | ICD-10-CM | POA: Diagnosis not present

## 2013-12-10 DIAGNOSIS — M5137 Other intervertebral disc degeneration, lumbosacral region: Secondary | ICD-10-CM | POA: Diagnosis not present

## 2013-12-11 ENCOUNTER — Other Ambulatory Visit: Payer: Self-pay | Admitting: Family Medicine

## 2013-12-11 ENCOUNTER — Ambulatory Visit
Admission: RE | Admit: 2013-12-11 | Discharge: 2013-12-11 | Disposition: A | Discharge status: Home / Self Care | Payer: Medicare Other | Source: Ambulatory Visit | Attending: Family Medicine | Admitting: Family Medicine

## 2013-12-11 DIAGNOSIS — M169 Osteoarthritis of hip, unspecified: Secondary | ICD-10-CM | POA: Diagnosis not present

## 2013-12-11 DIAGNOSIS — M5137 Other intervertebral disc degeneration, lumbosacral region: Secondary | ICD-10-CM | POA: Diagnosis not present

## 2013-12-11 DIAGNOSIS — M25551 Pain in right hip: Secondary | ICD-10-CM

## 2013-12-11 DIAGNOSIS — M47817 Spondylosis without myelopathy or radiculopathy, lumbosacral region: Secondary | ICD-10-CM | POA: Diagnosis not present

## 2013-12-11 DIAGNOSIS — M1610 Unilateral primary osteoarthritis, unspecified hip: Secondary | ICD-10-CM | POA: Diagnosis not present

## 2013-12-20 ENCOUNTER — Other Ambulatory Visit: Payer: Self-pay | Admitting: Family Medicine

## 2013-12-20 DIAGNOSIS — M25551 Pain in right hip: Secondary | ICD-10-CM

## 2013-12-21 ENCOUNTER — Ambulatory Visit
Admission: RE | Admit: 2013-12-21 | Discharge: 2013-12-21 | Disposition: A | Discharge status: Home / Self Care | Payer: Medicare Other | Source: Ambulatory Visit | Attending: Family Medicine | Admitting: Family Medicine

## 2013-12-21 DIAGNOSIS — M25551 Pain in right hip: Secondary | ICD-10-CM

## 2013-12-21 DIAGNOSIS — M25559 Pain in unspecified hip: Secondary | ICD-10-CM | POA: Diagnosis not present

## 2013-12-21 MED ORDER — IOHEXOL 180 MG/ML  SOLN
1.0000 mL | Freq: Once | INTRAMUSCULAR | Status: AC | PRN
  Administered 2013-12-21: 1 mL via INTRA_ARTICULAR

## 2013-12-21 MED ORDER — METHYLPREDNISOLONE ACETATE 40 MG/ML INJ SUSP (RADIOLOG
120.0000 mg | Freq: Once | INTRAMUSCULAR | Status: AC
  Administered 2013-12-21: 120 mg via INTRA_ARTICULAR

## 2013-12-24 ENCOUNTER — Other Ambulatory Visit: Payer: Medicare Other

## 2013-12-26 ENCOUNTER — Other Ambulatory Visit: Payer: Medicare Other

## 2014-01-03 DIAGNOSIS — E039 Hypothyroidism, unspecified: Secondary | ICD-10-CM | POA: Diagnosis not present

## 2014-01-03 DIAGNOSIS — G622 Polyneuropathy due to other toxic agents: Secondary | ICD-10-CM | POA: Diagnosis not present

## 2014-01-04 DIAGNOSIS — L600 Ingrowing nail: Secondary | ICD-10-CM | POA: Diagnosis not present

## 2014-01-17 DIAGNOSIS — Z23 Encounter for immunization: Secondary | ICD-10-CM | POA: Diagnosis not present

## 2014-02-05 DIAGNOSIS — H4011X1 Primary open-angle glaucoma, mild stage: Secondary | ICD-10-CM | POA: Diagnosis not present

## 2014-02-05 DIAGNOSIS — H4011X2 Primary open-angle glaucoma, moderate stage: Secondary | ICD-10-CM | POA: Diagnosis not present

## 2014-02-05 DIAGNOSIS — H25813 Combined forms of age-related cataract, bilateral: Secondary | ICD-10-CM | POA: Diagnosis not present

## 2014-02-05 DIAGNOSIS — H527 Unspecified disorder of refraction: Secondary | ICD-10-CM | POA: Diagnosis not present

## 2014-02-05 DIAGNOSIS — H43812 Vitreous degeneration, left eye: Secondary | ICD-10-CM | POA: Diagnosis not present

## 2014-03-07 ENCOUNTER — Encounter (HOSPITAL_COMMUNITY): Payer: Self-pay | Admitting: Cardiovascular Disease

## 2014-04-01 DIAGNOSIS — I5042 Chronic combined systolic (congestive) and diastolic (congestive) heart failure: Secondary | ICD-10-CM | POA: Diagnosis not present

## 2014-04-01 DIAGNOSIS — G620 Drug-induced polyneuropathy: Secondary | ICD-10-CM | POA: Diagnosis not present

## 2014-04-01 DIAGNOSIS — T451X5S Adverse effect of antineoplastic and immunosuppressive drugs, sequela: Secondary | ICD-10-CM | POA: Diagnosis not present

## 2014-04-01 DIAGNOSIS — E039 Hypothyroidism, unspecified: Secondary | ICD-10-CM | POA: Diagnosis not present

## 2014-04-01 DIAGNOSIS — M25551 Pain in right hip: Secondary | ICD-10-CM | POA: Diagnosis not present

## 2014-04-01 DIAGNOSIS — M1611 Unilateral primary osteoarthritis, right hip: Secondary | ICD-10-CM | POA: Diagnosis not present

## 2014-04-01 DIAGNOSIS — G894 Chronic pain syndrome: Secondary | ICD-10-CM | POA: Diagnosis not present

## 2014-04-02 ENCOUNTER — Ambulatory Visit (INDEPENDENT_AMBULATORY_CARE_PROVIDER_SITE_OTHER): Payer: Medicare Other | Admitting: Cardiovascular Disease

## 2014-04-02 ENCOUNTER — Encounter: Payer: Self-pay | Admitting: Cardiovascular Disease

## 2014-04-02 VITALS — BP 174/70 | HR 62 | Resp 16 | Ht 62.0 in | Wt 147.6 lb

## 2014-04-02 DIAGNOSIS — Z95810 Presence of automatic (implantable) cardiac defibrillator: Secondary | ICD-10-CM

## 2014-04-02 DIAGNOSIS — R0609 Other forms of dyspnea: Secondary | ICD-10-CM | POA: Diagnosis not present

## 2014-04-02 DIAGNOSIS — I429 Cardiomyopathy, unspecified: Secondary | ICD-10-CM

## 2014-04-02 DIAGNOSIS — I428 Other cardiomyopathies: Secondary | ICD-10-CM

## 2014-04-02 DIAGNOSIS — I5042 Chronic combined systolic (congestive) and diastolic (congestive) heart failure: Secondary | ICD-10-CM | POA: Diagnosis not present

## 2014-04-02 DIAGNOSIS — E785 Hyperlipidemia, unspecified: Secondary | ICD-10-CM | POA: Diagnosis not present

## 2014-04-02 LAB — MDC_IDC_ENUM_SESS_TYPE_INCLINIC
Battery Remaining Longevity: 69 mo
Battery Voltage: 2.98 V
Brady Statistic AP VP Percent: 35.78 %
Brady Statistic AP VS Percent: 0.7 %
Brady Statistic AS VP Percent: 62.49 %
Brady Statistic AS VS Percent: 1.02 %
Brady Statistic RA Percent Paced: 36.49 %
Brady Statistic RV Percent Paced: 96.77 %
Date Time Interrogation Session: 20160105123201
HighPow Impedance: 228 Ohm
HighPow Impedance: 42 Ohm
HighPow Impedance: 53 Ohm
Lead Channel Impedance Value: 304 Ohm
Lead Channel Impedance Value: 418 Ohm
Lead Channel Impedance Value: 418 Ohm
Lead Channel Impedance Value: 608 Ohm
Lead Channel Impedance Value: 608 Ohm
Lead Channel Pacing Threshold Amplitude: 0.5 V
Lead Channel Pacing Threshold Amplitude: 1.125 V
Lead Channel Pacing Threshold Amplitude: 2.25 V
Lead Channel Pacing Threshold Pulse Width: 0.4 ms
Lead Channel Pacing Threshold Pulse Width: 0.4 ms
Lead Channel Pacing Threshold Pulse Width: 0.4 ms
Lead Channel Sensing Intrinsic Amplitude: 2.75 mV
Lead Channel Sensing Intrinsic Amplitude: 22.375 mV
Lead Channel Sensing Intrinsic Amplitude: 24.375 mV
Lead Channel Sensing Intrinsic Amplitude: 3.625 mV
Lead Channel Setting Pacing Amplitude: 2 V
Lead Channel Setting Pacing Amplitude: 2.25 V
Lead Channel Setting Pacing Amplitude: 3 V
Lead Channel Setting Pacing Pulse Width: 0.4 ms
Lead Channel Setting Pacing Pulse Width: 1 ms
Lead Channel Setting Sensing Sensitivity: 0.3 mV
Zone Setting Detection Interval: 300 ms
Zone Setting Detection Interval: 350 ms
Zone Setting Detection Interval: 350 ms
Zone Setting Detection Interval: 400 ms

## 2014-04-02 NOTE — Patient Instructions (Signed)
Your physician has requested that you regularly monitor and record your blood pressure readings at home. Please use the same machine at the same time of day to check your readings and record them to bring to your follow-up visit.  After you have recorded 2 weeks of BP readings please send to the office for Dr. Loletha Grayer to review.  Remote monitoring is used to monitor your ICD from home. This monitoring reduces the number of office visits required to check your device to one time per year. It allows [name] to monitor the functioning of your device to ensure it is working properly. You are scheduled for a device check from home on July 03, 2014. You may send your transmission at any time that day. If you have a wireless device, the transmission will be sent automatically. After your physician reviews your transmission, you will receive a postcard with your next transmission date.  Dr. Sallyanne Kuster recommends that you schedule a follow-up appointment in: One year.

## 2014-04-07 ENCOUNTER — Encounter: Payer: Self-pay | Admitting: Cardiovascular Disease

## 2014-04-07 NOTE — Progress Notes (Signed)
Patient ID: ROSEMARY MOSSBARGER, female   DOB: 02/02/1929, 81 y.o.   MRN: [id number]      Reason for office visit CRT-D follow up  Mrs. Casebolt has a history of nonischemic cardiomyopathy and had a reported left ventricular ejection fraction as low as 20% in the past. She had normal coronary arteries by catheterization in 1992. After implementation of resynchronization pacemaker therapy in 2009 her ejection fraction has returned to virtually to normal. 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 She is here to discuss device generator change out. She is not pacemaker dependent. She has COPD and she attributes chronic shortness of breath with minimal exertion to this disorder. Hyperlipidemia and a history of neuropathy secondary to chemotherapy.  There is little change in her exercise tolerance (NYHA class II) and she denies other cardiac complaints. Her BP is unusually high today - usually it is normal.  Normal device check. 98.7% BiV pacing (1.9% VSRP). Optivol briefly abnormal in November/early December, self-resolved and asymptomatic.  No Known Allergies  Current Outpatient Prescriptions  Medication Sig Dispense Refill  . alendronate (FOSAMAX) 70 MG tablet Take 70 mg by mouth every 7 (seven) days. Take with a full glass of water on an empty stomach. Last taken Sunday 12/13/2010    . aspirin 81 MG chewable tablet Chew 81 mg by mouth daily.      . calcium-vitamin D (OSCAL WITH D) 250-125 MG-UNIT per tablet Take 1 tablet by mouth daily.      . carvedilol (COREG) 12.5 MG tablet TAKE 1 TABLET DAILY 90 tablet 2  . co-enzyme Q-10 50 MG capsule Take 50 mg by mouth daily.      . digoxin (DIGITEK) 0.25 MG tablet Take 1 tablet (0.25 mg total) by mouth daily. 90 tablet 2  . furosemide (LASIX) 20 MG tablet Take 10 mg by mouth daily.    Marland Kitchen latanoprost (XALATAN) 0.005 % ophthalmic solution Place 1 drop into both eyes at bedtime.  6  .  levothyroxine (SYNTHROID, LEVOTHROID) 137 MCG tablet Take 137 mcg by mouth daily.    Marland Kitchen liothyronine (CYTOMEL) 25 MCG tablet Take 25 mcg by mouth daily.     Marland Kitchen losartan (COZAAR) 100 MG tablet TAKE 1 TABLET DAILY 90 tablet 2  . mometasone-formoterol (DULERA) 100-5 MCG/ACT AERO Inhale 2 puffs into the lungs 2 (two) times daily. 3 Inhaler 3  . Omega-3 Fatty Acids (FISH OIL PO) Take 1 capsule by mouth daily.    . OXYCODONE HCL PO Take by mouth as needed.    Marland Kitchen PREDNISONE PO Take 1 tablet by mouth daily. For 1 month.    . vitamin C (ASCORBIC ACID) 500 MG tablet Take 500 mg by mouth daily.      No current facility-administered medications for this visit.    Past Medical History  Diagnosis Date  . AICD (automatic cardioverter/defibrillator) present 06/27/2007    medtronic  . Thyroid disease   . Cancer     ovarian--stage 4  . Cardiomyopathy   . CHF (congestive heart failure)   . Systemic hypertension   . Peripheral neuropathy     Past Surgical History  Procedure Laterality Date  . Appendectomy    . Cholecystectomy    . Cardiac defibrillator placement  06/27/2007    medtronic concerto  . Abdominal hysterectomy  oct 2000    along with ovarian ca surg  . Cardiac catheterization  09/27/1990    normal coronaries,dilated cardiomyopathy  .  Nm myocar perf wall motion  06/05/2007    no ischemia  . Implantable cardioverter defibrillator generator change N/A 07/31/2013    Procedure: IMPLANTABLE CARDIOVERTER DEFIBRILLATOR GENERATOR CHANGE;  Surgeon: Sanda Klein, MD;  Location: [facility] CATH LAB;  Service: Cardiovascular;  Laterality: N/A;    Family History  Problem Relation Age of Onset  . Heart disease Paternal Grandmother   . Lung cancer Mother     died from cancer  . Lung cancer Father     History   Social History  . Marital Status: Married    Spouse Name: N/A    Number of Children: N/A  . Years of Education: N/A   Occupational History  . housewife    Social History Main Topics  .  Smoking status: Never Smoker   . Smokeless tobacco: Not on file  . Alcohol Use: No  . Drug Use: No  . Sexual Activity: Not on file   Other Topics Concern  . Not on file   Social History Narrative    Review of systems: The patient specifically denies any chest pain at rest or with exertion, dyspnea at rest, orthopnea, paroxysmal nocturnal dyspnea, syncope, palpitations, focal neurological deficits, intermittent claudication, lower extremity edema, unexplained weight gain, cough, hemoptysis or wheezing.  The patient also denies abdominal pain, nausea, vomiting, dysphagia, diarrhea, constipation, polyuria, polydipsia, dysuria, hematuria, frequency, urgency, abnormal bleeding or bruising, fever, chills, unexpected weight changes, mood swings, change in skin or hair texture, change in voice quality, auditory or visual problems, allergic reactions or rashes, new musculoskeletal complaints other than usual "aches and pains".  PHYSICAL EXAM BP 174/70 mmHg  Pulse 62  Ht 5\' 2"  (1.575 m)  Wt 147 lb 9.6 oz (66.951 kg)  BMI 26.99 kg/m2  BP rechecked was similar General: Alert, oriented x3, no distress Head: no evidence of trauma, PERRL, EOMI, no exophtalmos or lid lag, no myxedema, no xanthelasma; normal ears, nose and oropharynx Neck: normal jugular venous pulsations and no hepatojugular reflux; brisk carotid pulses without delay and no carotid bruits Chest: clear to auscultation, no signs of consolidation by percussion or palpation, normal fremitus, symmetrical and full respiratory excursions. From a surgical standpoint the defibrillator site appears very healthy, but the device has migrated down into her breast to some degree Cardiovascular: normal position and quality of the apical impulse, regular rhythm, normal first and second heart sounds, no murmurs, rubs or gallops Abdomen: no tenderness or distention, no masses by palpation, no abnormal pulsatility or arterial bruits, normal bowel sounds,  no hepatosplenomegaly Extremities: no clubbing, cyanosis or edema; 2+ radial, ulnar and brachial pulses bilaterally; 2+ right femoral, posterior tibial and dorsalis pedis pulses; 2+ left femoral, posterior tibial and dorsalis pedis pulses; no subclavian or femoral bruits Neurological: grossly nonfocal  EKG: Atrial sensed, biventricular paced, right bundle branch block left axis deviation morphology  Lipid Panel     Component Value Date/Time   CHOL 164 06/27/2013 1027   TRIG 338* 06/27/2013 1027   HDL 37* 06/27/2013 1027   CHOLHDL 4.4 06/27/2013 1027   VLDL 68* 06/27/2013 1027   LDLCALC 59 06/27/2013 1027   LDLDIRECT 111* 03/06/2013 1054    BMET    Component Value Date/Time   NA 129* 07/26/2013 1238   K 4.4 07/26/2013 1238   CL 91* 07/26/2013 1238   CO2 27 07/26/2013 1238   GLUCOSE 83 07/26/2013 1238   BUN 16 07/26/2013 1238   CREATININE 0.82 07/26/2013 1238   CREATININE 0.51 12/18/2010 0456   CALCIUM  9.1 07/26/2013 1238   GFRNONAA >60 12/18/2010 0456   GFRAA >60 12/18/2010 0456     ASSESSMENT AND PLAN No problem-specific assessment & plan notes found for this encounter.  HTN Unusual for her - asked her to monitor and send [name] log of readings at home.  Chronic diastolic CHF, NYHA class 2 Appears well compensated with only mild ankle edema that could just as well be related to cor pulmonale. No changes were made to her medications.  Biventricular ICD Medtronic Concerto implanted 8676 Normal function. >98% biventricular pacing efficiency.  No history of defibrillator discharges and no recorded tachyarrhythmias. Changed tachy detection to MADIT-RIT model.  Nonischemic cardiomyopathy Her ejection fraction was as low as 20% at presentation and completely normalized after implementation of CRT and medical therapy  COPD (chronic obstructive pulmonary disease) Significant obstructive disease with an FEV1 that is roughly 60% of predicted but with good response to  bronchodilator suggesting an important component of asthma  DOE (dyspnea on exertion) Hard to distinguish whether this is secondary to CHF or asthma, suspect latter. No change in symptoms when Optivol was abnormal.  Hyperlipidemia Predominantly hypertriglyceridemia with other features suggestive of insulin resistance. We have discussed diet and lifestyle changes discussed in detail (reduced simple carbs, reduced starches esp. High glycemic index starches, increased PUFA, etc.).  Patient Instructions  Your physician has requested that you regularly monitor and record your blood pressure readings at home. Please use the same machine at the same time of day to check your readings and record them to bring to your follow-up visit.  After you have recorded 2 weeks of BP readings please send to the office for Dr. Loletha Grayer to review.  Remote monitoring is used to monitor your ICD from home. This monitoring reduces the number of office visits required to check your device to one time per year. It allows [name] to monitor the functioning of your device to ensure it is working properly. You are scheduled for a device check from home on July 03, 2014. You may send your transmission at any time that day. If you have a wireless device, the transmission will be sent automatically. After your physician reviews your transmission, you will receive a postcard with your next transmission date.  Dr. Sallyanne Kuster recommends that you schedule a follow-up appointment in: One year.         Orders Placed This Encounter  Procedures  . Implantable device check  . EKG 12-Lead   Meds ordered this encounter  Medications  . latanoprost (XALATAN) 0.005 % ophthalmic solution    Sig: Place 1 drop into both eyes at bedtime.    Refill:  6  . furosemide (LASIX) 20 MG tablet    Sig: Take 10 mg by mouth daily.  Marland Kitchen PREDNISONE PO    Sig: Take 1 tablet by mouth daily. For 1 month.  . OXYCODONE HCL PO    Sig: Take by mouth as needed.     Holli Humbles, MD, [place] [phone] office [phone] pager

## 2014-04-16 ENCOUNTER — Encounter: Payer: Self-pay | Admitting: Cardiovascular Disease

## 2014-05-01 DIAGNOSIS — M25551 Pain in right hip: Secondary | ICD-10-CM | POA: Diagnosis not present

## 2014-05-01 DIAGNOSIS — I10 Essential (primary) hypertension: Secondary | ICD-10-CM | POA: Diagnosis not present

## 2014-05-14 DIAGNOSIS — Z8543 Personal history of malignant neoplasm of ovary: Secondary | ICD-10-CM | POA: Diagnosis not present

## 2014-05-14 DIAGNOSIS — I10 Essential (primary) hypertension: Secondary | ICD-10-CM | POA: Diagnosis not present

## 2014-05-14 DIAGNOSIS — T451X5S Adverse effect of antineoplastic and immunosuppressive drugs, sequela: Secondary | ICD-10-CM | POA: Diagnosis not present

## 2014-05-14 DIAGNOSIS — G620 Drug-induced polyneuropathy: Secondary | ICD-10-CM | POA: Diagnosis not present

## 2014-05-14 DIAGNOSIS — M25551 Pain in right hip: Secondary | ICD-10-CM | POA: Diagnosis not present

## 2014-05-14 DIAGNOSIS — G894 Chronic pain syndrome: Secondary | ICD-10-CM | POA: Diagnosis not present

## 2014-05-14 DIAGNOSIS — J45909 Unspecified asthma, uncomplicated: Secondary | ICD-10-CM | POA: Diagnosis not present

## 2014-06-05 ENCOUNTER — Other Ambulatory Visit: Payer: Self-pay | Admitting: Cardiovascular Disease

## 2014-06-05 NOTE — Telephone Encounter (Signed)
Rx(s) sent to pharmacy electronically.  

## 2014-07-02 ENCOUNTER — Ambulatory Visit (INDEPENDENT_AMBULATORY_CARE_PROVIDER_SITE_OTHER): Payer: Medicare Other | Admitting: *Deleted

## 2014-07-02 DIAGNOSIS — I429 Cardiomyopathy, unspecified: Secondary | ICD-10-CM

## 2014-07-02 DIAGNOSIS — I5042 Chronic combined systolic (congestive) and diastolic (congestive) heart failure: Secondary | ICD-10-CM | POA: Diagnosis not present

## 2014-07-02 DIAGNOSIS — I428 Other cardiomyopathies: Secondary | ICD-10-CM

## 2014-07-02 NOTE — Progress Notes (Signed)
Remote ICD transmission.   

## 2014-07-03 LAB — MDC_IDC_ENUM_SESS_TYPE_REMOTE
Battery Remaining Longevity: 71 mo
Battery Voltage: 2.98 V
Brady Statistic AP VP Percent: 45.31 %
Brady Statistic AP VS Percent: 1.08 %
Brady Statistic AS VP Percent: 52.73 %
Brady Statistic AS VS Percent: 0.88 %
Brady Statistic RA Percent Paced: 46.39 %
Brady Statistic RV Percent Paced: 95.93 %
Date Time Interrogation Session: 20160405052404
HighPow Impedance: 45 Ohm
HighPow Impedance: 56 Ohm
Lead Channel Impedance Value: 342 Ohm
Lead Channel Impedance Value: 456 Ohm
Lead Channel Impedance Value: 456 Ohm
Lead Channel Impedance Value: 475 Ohm
Lead Channel Impedance Value: 608 Ohm
Lead Channel Impedance Value: 646 Ohm
Lead Channel Pacing Threshold Amplitude: 0.5 V
Lead Channel Pacing Threshold Amplitude: 1.25 V
Lead Channel Pacing Threshold Amplitude: 2 V
Lead Channel Pacing Threshold Pulse Width: 0.4 ms
Lead Channel Pacing Threshold Pulse Width: 0.4 ms
Lead Channel Pacing Threshold Pulse Width: 0.4 ms
Lead Channel Sensing Intrinsic Amplitude: 2.625 mV
Lead Channel Sensing Intrinsic Amplitude: 2.625 mV
Lead Channel Sensing Intrinsic Amplitude: 24.125 mV
Lead Channel Sensing Intrinsic Amplitude: 24.125 mV
Lead Channel Setting Pacing Amplitude: 2 V
Lead Channel Setting Pacing Amplitude: 2.25 V
Lead Channel Setting Pacing Amplitude: 4.25 V
Lead Channel Setting Pacing Pulse Width: 0.4 ms
Lead Channel Setting Pacing Pulse Width: 0.4 ms
Lead Channel Setting Sensing Sensitivity: 0.3 mV
Zone Setting Detection Interval: 300 ms
Zone Setting Detection Interval: 350 ms
Zone Setting Detection Interval: 350 ms
Zone Setting Detection Interval: 400 ms

## 2014-07-09 ENCOUNTER — Encounter: Payer: Self-pay | Admitting: Cardiology

## 2014-07-15 ENCOUNTER — Encounter: Payer: Self-pay | Admitting: Cardiovascular Disease

## 2014-07-24 DIAGNOSIS — M25551 Pain in right hip: Secondary | ICD-10-CM | POA: Diagnosis not present

## 2014-07-30 DIAGNOSIS — M25551 Pain in right hip: Secondary | ICD-10-CM | POA: Diagnosis not present

## 2014-08-01 DIAGNOSIS — M25551 Pain in right hip: Secondary | ICD-10-CM | POA: Diagnosis not present

## 2014-08-06 DIAGNOSIS — M25551 Pain in right hip: Secondary | ICD-10-CM | POA: Diagnosis not present

## 2014-08-08 DIAGNOSIS — M25551 Pain in right hip: Secondary | ICD-10-CM | POA: Diagnosis not present

## 2014-08-12 DIAGNOSIS — G894 Chronic pain syndrome: Secondary | ICD-10-CM | POA: Diagnosis not present

## 2014-08-12 DIAGNOSIS — E039 Hypothyroidism, unspecified: Secondary | ICD-10-CM | POA: Diagnosis not present

## 2014-08-12 DIAGNOSIS — G620 Drug-induced polyneuropathy: Secondary | ICD-10-CM | POA: Diagnosis not present

## 2014-10-01 DIAGNOSIS — H4011X2 Primary open-angle glaucoma, moderate stage: Secondary | ICD-10-CM | POA: Diagnosis not present

## 2014-10-01 DIAGNOSIS — H4011X1 Primary open-angle glaucoma, mild stage: Secondary | ICD-10-CM | POA: Diagnosis not present

## 2014-10-01 DIAGNOSIS — H25813 Combined forms of age-related cataract, bilateral: Secondary | ICD-10-CM | POA: Diagnosis not present

## 2014-10-01 DIAGNOSIS — H43812 Vitreous degeneration, left eye: Secondary | ICD-10-CM | POA: Diagnosis not present

## 2014-10-01 DIAGNOSIS — H527 Unspecified disorder of refraction: Secondary | ICD-10-CM | POA: Diagnosis not present

## 2014-10-02 ENCOUNTER — Encounter: Payer: Self-pay | Admitting: Cardiovascular Disease

## 2014-10-02 ENCOUNTER — Ambulatory Visit (INDEPENDENT_AMBULATORY_CARE_PROVIDER_SITE_OTHER): Payer: Medicare Other | Admitting: *Deleted

## 2014-10-02 DIAGNOSIS — I5042 Chronic combined systolic (congestive) and diastolic (congestive) heart failure: Secondary | ICD-10-CM

## 2014-10-02 DIAGNOSIS — I429 Cardiomyopathy, unspecified: Secondary | ICD-10-CM | POA: Diagnosis not present

## 2014-10-02 DIAGNOSIS — I428 Other cardiomyopathies: Secondary | ICD-10-CM

## 2014-10-02 NOTE — Progress Notes (Signed)
Remote ICD transmission.   

## 2014-10-06 LAB — CUP PACEART REMOTE DEVICE CHECK
Battery Remaining Longevity: 66 mo
Battery Voltage: 2.97 V
Brady Statistic AS VP Percent: 42.01 %
Brady Statistic AS VS Percent: 0.71 %
Date Time Interrogation Session: 20160706113724
HIGH POWER IMPEDANCE MEASURED VALUE: 45 Ohm
HighPow Impedance: 55 Ohm
Lead Channel Impedance Value: 513 Ohm
Lead Channel Impedance Value: 589 Ohm
Lead Channel Impedance Value: 646 Ohm
Lead Channel Pacing Threshold Amplitude: 1.125 V
Lead Channel Pacing Threshold Amplitude: 2.25 V
Lead Channel Pacing Threshold Pulse Width: 0.4 ms
Lead Channel Pacing Threshold Pulse Width: 0.4 ms
Lead Channel Sensing Intrinsic Amplitude: 1 mV
Lead Channel Sensing Intrinsic Amplitude: 22.25 mV
Lead Channel Setting Pacing Amplitude: 2 V
Lead Channel Setting Pacing Amplitude: 2.25 V
Lead Channel Setting Pacing Amplitude: 4.5 V
Lead Channel Setting Sensing Sensitivity: 0.3 mV
MDC IDC MSMT LEADCHNL LV IMPEDANCE VALUE: 361 Ohm
MDC IDC MSMT LEADCHNL LV IMPEDANCE VALUE: 456 Ohm
MDC IDC MSMT LEADCHNL RA IMPEDANCE VALUE: 475 Ohm
MDC IDC MSMT LEADCHNL RA PACING THRESHOLD AMPLITUDE: 0.5 V
MDC IDC MSMT LEADCHNL RA SENSING INTR AMPL: 1 mV
MDC IDC MSMT LEADCHNL RV PACING THRESHOLD PULSEWIDTH: 0.4 ms
MDC IDC MSMT LEADCHNL RV SENSING INTR AMPL: 22.25 mV
MDC IDC SET LEADCHNL LV PACING PULSEWIDTH: 0.4 ms
MDC IDC SET LEADCHNL RV PACING PULSEWIDTH: 0.4 ms
MDC IDC STAT BRADY AP VP PERCENT: 55.85 %
MDC IDC STAT BRADY AP VS PERCENT: 1.43 %
MDC IDC STAT BRADY RA PERCENT PACED: 57.28 %
MDC IDC STAT BRADY RV PERCENT PACED: 90.92 %
Zone Setting Detection Interval: 300 ms
Zone Setting Detection Interval: 350 ms
Zone Setting Detection Interval: 350 ms
Zone Setting Detection Interval: 400 ms

## 2014-10-10 DIAGNOSIS — G894 Chronic pain syndrome: Secondary | ICD-10-CM | POA: Diagnosis not present

## 2014-10-10 DIAGNOSIS — G620 Drug-induced polyneuropathy: Secondary | ICD-10-CM | POA: Diagnosis not present

## 2014-10-14 ENCOUNTER — Encounter: Payer: Self-pay | Admitting: Cardiology

## 2014-10-29 ENCOUNTER — Telehealth: Payer: Self-pay | Admitting: Cardiovascular Disease

## 2014-10-29 ENCOUNTER — Other Ambulatory Visit: Payer: Self-pay | Admitting: *Deleted

## 2014-10-29 MED ORDER — CARVEDILOL 12.5 MG PO TABS: 12.5000 mg | ORAL_TABLET | Freq: Two times a day (BID) | ORAL | Status: DC

## 2014-10-29 NOTE — Telephone Encounter (Signed)
Spoke with pt, she reports her medical doctor increased her carvedilol to 12.5 mg twice daily. She is tolerating that dosage and requested a refill. Refill sent to the pharmacy

## 2014-10-29 NOTE — Telephone Encounter (Signed)
Please call,concerning her Carvedilol.

## 2014-11-13 ENCOUNTER — Other Ambulatory Visit: Payer: Self-pay | Admitting: Cardiovascular Disease

## 2014-11-13 NOTE — Telephone Encounter (Signed)
Rx request sent to pharmacy.  

## 2014-11-25 DIAGNOSIS — G620 Drug-induced polyneuropathy: Secondary | ICD-10-CM | POA: Diagnosis not present

## 2014-11-25 DIAGNOSIS — G894 Chronic pain syndrome: Secondary | ICD-10-CM | POA: Diagnosis not present

## 2014-12-01 ENCOUNTER — Other Ambulatory Visit: Payer: Self-pay | Admitting: Cardiovascular Disease

## 2014-12-04 DIAGNOSIS — M545 Low back pain: Secondary | ICD-10-CM | POA: Diagnosis not present

## 2014-12-04 DIAGNOSIS — M5136 Other intervertebral disc degeneration, lumbar region: Secondary | ICD-10-CM | POA: Diagnosis not present

## 2015-01-01 DIAGNOSIS — M5136 Other intervertebral disc degeneration, lumbar region: Secondary | ICD-10-CM | POA: Diagnosis not present

## 2015-01-06 ENCOUNTER — Encounter: Payer: Self-pay | Admitting: Internal Medicine

## 2015-01-06 ENCOUNTER — Telehealth: Payer: Self-pay | Admitting: Cardiology

## 2015-01-06 ENCOUNTER — Ambulatory Visit (INDEPENDENT_AMBULATORY_CARE_PROVIDER_SITE_OTHER): Payer: Medicare Other | Admitting: *Deleted

## 2015-01-06 DIAGNOSIS — I429 Cardiomyopathy, unspecified: Secondary | ICD-10-CM

## 2015-01-06 DIAGNOSIS — I428 Other cardiomyopathies: Secondary | ICD-10-CM

## 2015-01-06 DIAGNOSIS — I5042 Chronic combined systolic (congestive) and diastolic (congestive) heart failure: Secondary | ICD-10-CM

## 2015-01-06 NOTE — Telephone Encounter (Signed)
LMOVM reminding pt to send remote transmission.   

## 2015-01-06 NOTE — Progress Notes (Signed)
Remote ICD transmission.   

## 2015-01-07 LAB — CUP PACEART REMOTE DEVICE CHECK
Battery Voltage: 2.97 V
Brady Statistic AP VP Percent: 47.48 %
Brady Statistic AP VS Percent: 1.45 %
Brady Statistic AS VP Percent: 50.17 %
Brady Statistic AS VS Percent: 0.89 %
Brady Statistic RV Percent Paced: 85.93 %
Date Time Interrogation Session: 20161010073425
HIGH POWER IMPEDANCE MEASURED VALUE: 45 Ohm
HighPow Impedance: 56 Ohm
Lead Channel Impedance Value: 342 Ohm
Lead Channel Impedance Value: 418 Ohm
Lead Channel Impedance Value: 532 Ohm
Lead Channel Impedance Value: 608 Ohm
Lead Channel Impedance Value: 608 Ohm
Lead Channel Pacing Threshold Amplitude: 0.625 V
Lead Channel Pacing Threshold Amplitude: 1.25 V
Lead Channel Pacing Threshold Pulse Width: 0.4 ms
Lead Channel Pacing Threshold Pulse Width: 0.4 ms
Lead Channel Sensing Intrinsic Amplitude: 1.625 mV
Lead Channel Sensing Intrinsic Amplitude: 1.625 mV
Lead Channel Sensing Intrinsic Amplitude: 24.625 mV
Lead Channel Setting Pacing Amplitude: 5 V
Lead Channel Setting Pacing Pulse Width: 0.4 ms
Lead Channel Setting Sensing Sensitivity: 0.3 mV
MDC IDC MSMT BATTERY REMAINING LONGEVITY: 51 mo
MDC IDC MSMT LEADCHNL RA IMPEDANCE VALUE: 532 Ohm
MDC IDC MSMT LEADCHNL RV PACING THRESHOLD AMPLITUDE: 2.25 V
MDC IDC MSMT LEADCHNL RV PACING THRESHOLD PULSEWIDTH: 0.4 ms
MDC IDC MSMT LEADCHNL RV SENSING INTR AMPL: 24.625 mV
MDC IDC SET LEADCHNL LV PACING AMPLITUDE: 2.25 V
MDC IDC SET LEADCHNL RA PACING AMPLITUDE: 2 V
MDC IDC SET LEADCHNL RV PACING PULSEWIDTH: 0.4 ms
MDC IDC SET ZONE DETECTION INTERVAL: 300 ms
MDC IDC STAT BRADY RA PERCENT PACED: 48.93 %
Zone Setting Detection Interval: 350 ms
Zone Setting Detection Interval: 350 ms
Zone Setting Detection Interval: 400 ms

## 2015-01-30 DIAGNOSIS — Z23 Encounter for immunization: Secondary | ICD-10-CM | POA: Diagnosis not present

## 2015-02-17 DIAGNOSIS — J189 Pneumonia, unspecified organism: Secondary | ICD-10-CM | POA: Diagnosis not present

## 2015-02-17 DIAGNOSIS — R05 Cough: Secondary | ICD-10-CM | POA: Diagnosis not present

## 2015-02-19 ENCOUNTER — Encounter: Payer: Self-pay | Admitting: *Deleted

## 2015-03-04 ENCOUNTER — Encounter: Payer: Self-pay | Admitting: Cardiovascular Disease

## 2015-03-18 DIAGNOSIS — R296 Repeated falls: Secondary | ICD-10-CM | POA: Diagnosis not present

## 2015-03-18 DIAGNOSIS — E039 Hypothyroidism, unspecified: Secondary | ICD-10-CM | POA: Diagnosis not present

## 2015-03-18 DIAGNOSIS — M159 Polyosteoarthritis, unspecified: Secondary | ICD-10-CM | POA: Diagnosis not present

## 2015-03-18 DIAGNOSIS — G894 Chronic pain syndrome: Secondary | ICD-10-CM | POA: Diagnosis not present

## 2015-03-18 DIAGNOSIS — J45909 Unspecified asthma, uncomplicated: Secondary | ICD-10-CM | POA: Diagnosis not present

## 2015-03-18 DIAGNOSIS — J181 Lobar pneumonia, unspecified organism: Secondary | ICD-10-CM | POA: Diagnosis not present

## 2015-03-28 ENCOUNTER — Emergency Department (HOSPITAL_BASED_OUTPATIENT_CLINIC_OR_DEPARTMENT_OTHER): Payer: Medicare Other

## 2015-03-28 ENCOUNTER — Encounter (HOSPITAL_BASED_OUTPATIENT_CLINIC_OR_DEPARTMENT_OTHER): Payer: Self-pay | Admitting: Emergency Medicine

## 2015-03-28 ENCOUNTER — Observation Stay (HOSPITAL_BASED_OUTPATIENT_CLINIC_OR_DEPARTMENT_OTHER)
Admission: EM | Admit: 2015-03-28 | Discharge: 2015-03-30 | Disposition: A | Discharge status: Home / Self Care | Payer: Medicare Other | Attending: General Surgery | Admitting: General Surgery

## 2015-03-28 DIAGNOSIS — Z8543 Personal history of malignant neoplasm of ovary: Secondary | ICD-10-CM | POA: Insufficient documentation

## 2015-03-28 DIAGNOSIS — R197 Diarrhea, unspecified: Secondary | ICD-10-CM | POA: Diagnosis not present

## 2015-03-28 DIAGNOSIS — K5669 Other intestinal obstruction: Secondary | ICD-10-CM | POA: Diagnosis not present

## 2015-03-28 DIAGNOSIS — G629 Polyneuropathy, unspecified: Secondary | ICD-10-CM | POA: Diagnosis not present

## 2015-03-28 DIAGNOSIS — M1611 Unilateral primary osteoarthritis, right hip: Secondary | ICD-10-CM | POA: Diagnosis not present

## 2015-03-28 DIAGNOSIS — K430 Incisional hernia with obstruction, without gangrene: Principal | ICD-10-CM | POA: Diagnosis present

## 2015-03-28 DIAGNOSIS — R112 Nausea with vomiting, unspecified: Secondary | ICD-10-CM | POA: Diagnosis not present

## 2015-03-28 DIAGNOSIS — K429 Umbilical hernia without obstruction or gangrene: Secondary | ICD-10-CM | POA: Diagnosis not present

## 2015-03-28 DIAGNOSIS — I429 Cardiomyopathy, unspecified: Secondary | ICD-10-CM | POA: Insufficient documentation

## 2015-03-28 DIAGNOSIS — Z95810 Presence of automatic (implantable) cardiac defibrillator: Secondary | ICD-10-CM | POA: Insufficient documentation

## 2015-03-28 DIAGNOSIS — J449 Chronic obstructive pulmonary disease, unspecified: Secondary | ICD-10-CM | POA: Insufficient documentation

## 2015-03-28 DIAGNOSIS — K8689 Other specified diseases of pancreas: Secondary | ICD-10-CM | POA: Insufficient documentation

## 2015-03-28 DIAGNOSIS — I509 Heart failure, unspecified: Secondary | ICD-10-CM | POA: Diagnosis not present

## 2015-03-28 DIAGNOSIS — K56609 Unspecified intestinal obstruction, unspecified as to partial versus complete obstruction: Secondary | ICD-10-CM | POA: Diagnosis present

## 2015-03-28 DIAGNOSIS — I110 Hypertensive heart disease with heart failure: Secondary | ICD-10-CM | POA: Diagnosis not present

## 2015-03-28 DIAGNOSIS — G8929 Other chronic pain: Secondary | ICD-10-CM | POA: Insufficient documentation

## 2015-03-28 DIAGNOSIS — R1110 Vomiting, unspecified: Secondary | ICD-10-CM

## 2015-03-28 DIAGNOSIS — Z23 Encounter for immunization: Secondary | ICD-10-CM | POA: Insufficient documentation

## 2015-03-28 DIAGNOSIS — M549 Dorsalgia, unspecified: Secondary | ICD-10-CM | POA: Insufficient documentation

## 2015-03-28 DIAGNOSIS — K420 Umbilical hernia with obstruction, without gangrene: Secondary | ICD-10-CM | POA: Diagnosis not present

## 2015-03-28 DIAGNOSIS — K5660 Unspecified intestinal obstruction: Secondary | ICD-10-CM | POA: Diagnosis not present

## 2015-03-28 LAB — CBC WITH DIFFERENTIAL/PLATELET
BASOS ABS: 0 10*3/uL (ref 0.0–0.1)
Basophils Relative: 0 %
Eosinophils Absolute: 0 10*3/uL (ref 0.0–0.7)
Eosinophils Relative: 0 %
HEMATOCRIT: 44.6 % (ref 36.0–46.0)
Hemoglobin: 15.1 g/dL — ABNORMAL HIGH (ref 12.0–15.0)
LYMPHS ABS: 0.7 10*3/uL (ref 0.7–4.0)
LYMPHS PCT: 11 %
MCH: 29.9 pg (ref 26.0–34.0)
MCHC: 33.9 g/dL (ref 30.0–36.0)
MCV: 88.3 fL (ref 78.0–100.0)
MONO ABS: 1.2 10*3/uL — AB (ref 0.1–1.0)
Monocytes Relative: 18 %
NEUTROS ABS: 4.8 10*3/uL (ref 1.7–7.7)
Neutrophils Relative %: 71 %
Platelets: 234 10*3/uL (ref 150–400)
RBC: 5.05 MIL/uL (ref 3.87–5.11)
RDW: 12.6 % (ref 11.5–15.5)
WBC: 6.8 10*3/uL (ref 4.0–10.5)

## 2015-03-28 LAB — CREATININE, SERUM
Creatinine, Ser: 1.09 mg/dL — ABNORMAL HIGH (ref 0.44–1.00)
GFR calc non Af Amer: 45 mL/min — ABNORMAL LOW (ref >60)
GFR, EST AFRICAN AMERICAN: 52 mL/min — AB (ref >60)

## 2015-03-28 LAB — LIPASE, BLOOD: LIPASE: 17 U/L (ref 11–51)

## 2015-03-28 LAB — CBC
HEMATOCRIT: 41.4 % (ref 36.0–46.0)
HEMOGLOBIN: 14 g/dL (ref 12.0–15.0)
MCH: 30.6 pg (ref 26.0–34.0)
MCHC: 33.8 g/dL (ref 30.0–36.0)
MCV: 90.6 fL (ref 78.0–100.0)
Platelets: 207 10*3/uL (ref 150–400)
RBC: 4.57 MIL/uL (ref 3.87–5.11)
RDW: 12.7 % (ref 11.5–15.5)
WBC: 5.9 10*3/uL (ref 4.0–10.5)

## 2015-03-28 LAB — COMPREHENSIVE METABOLIC PANEL
ALT: 15 U/L (ref 14–54)
AST: 25 U/L (ref 15–41)
Albumin: 4.2 g/dL (ref 3.5–5.0)
Alkaline Phosphatase: 58 U/L (ref 38–126)
Anion gap: 11 (ref 5–15)
BILIRUBIN TOTAL: 1 mg/dL (ref 0.3–1.2)
BUN: 29 mg/dL — AB (ref 6–20)
CO2: 31 mmol/L (ref 22–32)
CREATININE: 1.11 mg/dL — AB (ref 0.44–1.00)
Calcium: 9.7 mg/dL (ref 8.9–10.3)
Chloride: 90 mmol/L — ABNORMAL LOW (ref 101–111)
GFR calc Af Amer: 51 mL/min — ABNORMAL LOW (ref >60)
GFR, EST NON AFRICAN AMERICAN: 44 mL/min — AB (ref >60)
Glucose, Bld: 140 mg/dL — ABNORMAL HIGH (ref 65–99)
POTASSIUM: 4.2 mmol/L (ref 3.5–5.1)
Sodium: 132 mmol/L — ABNORMAL LOW (ref 135–145)
TOTAL PROTEIN: 7.9 g/dL (ref 6.5–8.1)

## 2015-03-28 LAB — TROPONIN I: Troponin I: 0.03 ng/mL (ref <0.031)

## 2015-03-28 LAB — I-STAT CG4 LACTIC ACID, ED: Lactic Acid, Venous: 1.11 mmol/L (ref 0.5–2.0)

## 2015-03-28 MED ORDER — IOHEXOL 300 MG/ML  SOLN
25.0000 mL | Freq: Once | INTRAMUSCULAR | Status: AC | PRN
Start: 2015-03-28 — End: 2015-03-28
  Administered 2015-03-28: 25 mL via ORAL

## 2015-03-28 MED ORDER — BISACODYL 10 MG RE SUPP
10.0000 mg | Freq: Once | RECTAL | Status: AC
  Administered 2015-03-28: 10 mg via RECTAL
  Filled 2015-03-28: qty 1

## 2015-03-28 MED ORDER — ACETAMINOPHEN 325 MG PO TABS
650.0000 mg | ORAL_TABLET | Freq: Four times a day (QID) | ORAL | Status: DC | PRN
  Administered 2015-03-30: 650 mg via ORAL
  Filled 2015-03-28: qty 2

## 2015-03-28 MED ORDER — DEXTROSE-NACL 5-0.45 % IV SOLN
INTRAVENOUS | Status: DC
  Administered 2015-03-28 – 2015-03-29 (×3): via INTRAVENOUS

## 2015-03-28 MED ORDER — HYDRALAZINE HCL 20 MG/ML IJ SOLN
10.0000 mg | INTRAMUSCULAR | Status: DC | PRN
  Administered 2015-03-28 – 2015-03-29 (×2): 10 mg via INTRAVENOUS
  Filled 2015-03-28 (×2): qty 1

## 2015-03-28 MED ORDER — ONDANSETRON 4 MG PO TBDP: 4.0000 mg | ORAL_TABLET | Freq: Four times a day (QID) | ORAL | Status: DC | PRN

## 2015-03-28 MED ORDER — DIPHENHYDRAMINE HCL 50 MG/ML IJ SOLN
12.5000 mg | Freq: Once | INTRAMUSCULAR | Status: AC
  Administered 2015-03-28: 12.5 mg via INTRAVENOUS
  Filled 2015-03-28: qty 1

## 2015-03-28 MED ORDER — ONDANSETRON HCL 4 MG/2ML IJ SOLN
4.0000 mg | INTRAMUSCULAR | Status: DC | PRN
  Administered 2015-03-28 – 2015-03-29 (×6): 4 mg via INTRAVENOUS
  Filled 2015-03-28 (×6): qty 2

## 2015-03-28 MED ORDER — ACETAMINOPHEN 650 MG RE SUPP: 650.0000 mg | Freq: Four times a day (QID) | RECTAL | Status: DC | PRN

## 2015-03-28 MED ORDER — SODIUM CHLORIDE 0.9 % IV BOLUS (SEPSIS)
500.0000 mL | Freq: Once | INTRAVENOUS | Status: AC
  Administered 2015-03-28: 500 mL via INTRAVENOUS

## 2015-03-28 MED ORDER — LEVOTHYROXINE SODIUM 100 MCG IV SOLR: 68.5000 ug | Freq: Every day | INTRAVENOUS | Status: DC

## 2015-03-28 MED ORDER — LEVOTHYROXINE SODIUM 100 MCG IV SOLR
50.0000 ug | Freq: Every day | INTRAVENOUS | Status: DC
Start: 2015-03-28 — End: 2015-03-29
  Administered 2015-03-28 – 2015-03-29 (×2): 50 ug via INTRAVENOUS
  Filled 2015-03-28 (×2): qty 5

## 2015-03-28 MED ORDER — METOCLOPRAMIDE HCL 5 MG/ML IJ SOLN
10.0000 mg | Freq: Once | INTRAMUSCULAR | Status: AC
  Administered 2015-03-28: 10 mg via INTRAVENOUS
  Filled 2015-03-28: qty 2

## 2015-03-28 MED ORDER — ONDANSETRON HCL 4 MG/2ML IJ SOLN
4.0000 mg | INTRAMUSCULAR | Status: AC | PRN
  Administered 2015-03-28 (×2): 4 mg via INTRAVENOUS
  Filled 2015-03-28 (×2): qty 2

## 2015-03-28 MED ORDER — MOMETASONE FURO-FORMOTEROL FUM 100-5 MCG/ACT IN AERO
2.0000 | INHALATION_SPRAY | Freq: Two times a day (BID) | RESPIRATORY_TRACT | Status: DC
Start: 2015-03-28 — End: 2015-03-28

## 2015-03-28 MED ORDER — PHENOL 1.4 % MT LIQD
1.0000 | OROMUCOSAL | Status: DC | PRN
  Filled 2015-03-28: qty 177

## 2015-03-28 MED ORDER — MORPHINE SULFATE (PF) 2 MG/ML IV SOLN
1.0000 mg | INTRAVENOUS | Status: DC | PRN
  Administered 2015-03-28 (×2): 4 mg via INTRAVENOUS
  Administered 2015-03-29 (×2): 2 mg via INTRAVENOUS
  Administered 2015-03-29: 4 mg via INTRAVENOUS
  Administered 2015-03-29 – 2015-03-30 (×3): 2 mg via INTRAVENOUS
  Filled 2015-03-28: qty 1
  Filled 2015-03-28 (×2): qty 2
  Filled 2015-03-28 (×4): qty 1
  Filled 2015-03-28: qty 2

## 2015-03-28 MED ORDER — IOHEXOL 300 MG/ML  SOLN
100.0000 mL | Freq: Once | INTRAMUSCULAR | Status: AC | PRN
  Administered 2015-03-28: 100 mL via INTRAVENOUS

## 2015-03-28 MED ORDER — LATANOPROST 0.005 % OP SOLN
1.0000 [drp] | Freq: Every day | OPHTHALMIC | Status: DC
  Administered 2015-03-28 – 2015-03-29 (×2): 1 [drp] via OPHTHALMIC
  Filled 2015-03-28: qty 2.5

## 2015-03-28 MED ORDER — PROMETHAZINE HCL 25 MG/ML IJ SOLN
12.5000 mg | Freq: Four times a day (QID) | INTRAMUSCULAR | Status: DC | PRN
  Administered 2015-03-28 – 2015-03-29 (×3): 12.5 mg via INTRAVENOUS
  Filled 2015-03-28 (×3): qty 1

## 2015-03-28 MED ORDER — ENOXAPARIN SODIUM 40 MG/0.4ML ~~LOC~~ SOLN
40.0000 mg | SUBCUTANEOUS | Status: DC
  Administered 2015-03-28 – 2015-03-29 (×2): 40 mg via SUBCUTANEOUS
  Filled 2015-03-28 (×3): qty 0.4

## 2015-03-28 MED ORDER — PANTOPRAZOLE SODIUM 40 MG IV SOLR
40.0000 mg | Freq: Every day | INTRAVENOUS | Status: DC
  Administered 2015-03-28 – 2015-03-29 (×2): 40 mg via INTRAVENOUS
  Filled 2015-03-28 (×3): qty 40

## 2015-03-28 MED ORDER — METOPROLOL TARTRATE 1 MG/ML IV SOLN
5.0000 mg | Freq: Four times a day (QID) | INTRAVENOUS | Status: DC
  Administered 2015-03-28 – 2015-03-29 (×3): 5 mg via INTRAVENOUS
  Filled 2015-03-28 (×7): qty 5

## 2015-03-28 NOTE — ED Notes (Signed)
PT RECEIVED TRANSFERRED FROM [facility] FOR A SBO. NG-TUBE IN PLACE. AAOX3. INITIAL ASSESSMENT COMPLETED. AWAITING FURTHER ORDERS.

## 2015-03-28 NOTE — ED Notes (Signed)
Pt aware urine sample is needed, denies being able to go at the time. Will notify RN

## 2015-03-28 NOTE — H&P (Addendum)
[name] [birth year]  [id number].   Chief Complaint/Reason for Consult: sbo secondary to incarcerated incisional hernia HPI: This is a very pleasant 81 yo WF with several other medical problems who has had severe nausea for the last 3 days.  She did not have any emesis until this morning.  She normally has a BM on a daily basis, but has not had one now in 3 days.  She is not passing any flatus either.  She denies any abdominal pain, fevers, chills, etc.  Her husband just unexpectedly passed away 2 days ago.  This morning, her nausea got so bad she was taken to the [facility] ED where she had a CT scan which revealed an incarcerated incisional hernia causing a SBO.  She had an NGT placed and was transported here to [facility] for [name] to evaluate.  ROS : Please see HPI, otherwise all other systems are negative.  Family History  Problem Relation Age of Onset  . Heart disease Paternal Grandmother   . Lung cancer Mother     died from cancer  . Lung cancer Father     Past Medical History  Diagnosis Date  . AICD (automatic cardioverter/defibrillator) present 06/27/2007    medtronic  . Thyroid disease   . Cancer ([place])     ovarian--stage 4  . Cardiomyopathy ([place])   . CHF (congestive heart failure) ([place])   . Systemic hypertension   . Peripheral neuropathy [facility])     Past Surgical History  Procedure Laterality Date  . Appendectomy    . Cholecystectomy    . Cardiac defibrillator placement  06/27/2007    medtronic concerto  . Abdominal hysterectomy  oct 2000    along with ovarian ca surg  . Cardiac catheterization  09/27/1990    normal coronaries,dilated cardiomyopathy  . Nm myocar perf wall motion  06/05/2007    no ischemia  . Implantable cardioverter defibrillator generator change N/A 07/31/2013    Procedure: IMPLANTABLE CARDIOVERTER DEFIBRILLATOR GENERATOR CHANGE;  Surgeon: Sanda Klein, MD;  Location: [facility] CATH LAB;  Service: Cardiovascular;  Laterality: N/A;    Social History:  reports that she has  never smoked. She does not have any smokeless tobacco history on file. She reports that she does not drink alcohol or use illicit drugs.  Allergies: No Known Allergies   (Not in a hospital admission)  Blood pressure 181/68, pulse 75, temperature 98.1 F (36.7 C), temperature source Oral, resp. rate 16, height '5\' 2"'$  (1.575 m), weight 60.782 kg (134 lb), SpO2 93 %. Physical Exam: General: pleasant, WD, WN white female who is laying in bed in NAD HEENT: head is normocephalic, atraumatic.  Sclera are noninjected.  PERRL.  Ears and nose without any masses or lesions.  Mouth is pink and moist Heart: regular, rate, and rhythm.  Normal s1,s2. No obvious gallops, or rubs noted.  Palpable radial and pedal pulses bilaterally, + murmur Lungs: CTAB, no wheezes, rhonchi, or rales noted.  Respiratory effort nonlabored Abd: soft, NT, poochy, but states this is her norm, +BS, no masses or organomegaly.  She does have a swiss cheese defect of her abdominal wall.  She has multiple defects that are palpated.  All hernias are free of contents and are reduced.    MS: all 4 extremities are symmetrical with no cyanosis, clubbing, or edema. Skin: warm and dry with no masses, lesions, or rashes Psych: A&Ox3 with an appropriate affect.    Results for orders placed or performed during the hospital encounter of 03/28/15 (  from the past 48 hour(s))  CBC with Differential     Status: Abnormal   Collection Time: 03/28/15  9:20 AM  Result Value Ref Range   WBC 6.8 4.0 - 10.5 K/uL   RBC 5.05 3.87 - 5.11 MIL/uL   Hemoglobin 15.1 (H) 12.0 - 15.0 g/dL   HCT 44.6 36.0 - 46.0 %   MCV 88.3 78.0 - 100.0 fL   MCH 29.9 26.0 - 34.0 pg   MCHC 33.9 30.0 - 36.0 g/dL   RDW 12.6 11.5 - 15.5 %   Platelets 234 150 - 400 K/uL   Neutrophils Relative % 71 %   Neutro Abs 4.8 1.7 - 7.7 K/uL   Lymphocytes Relative 11 %   Lymphs Abs 0.7 0.7 - 4.0 K/uL   Monocytes Relative 18 %   Monocytes Absolute 1.2 (H) 0.1 - 1.0 K/uL   Eosinophils  Relative 0 %   Eosinophils Absolute 0.0 0.0 - 0.7 K/uL   Basophils Relative 0 %   Basophils Absolute 0.0 0.0 - 0.1 K/uL  Comprehensive metabolic panel     Status: Abnormal   Collection Time: 03/28/15  9:20 AM  Result Value Ref Range   Sodium 132 (L) 135 - 145 mmol/L   Potassium 4.2 3.5 - 5.1 mmol/L   Chloride 90 (L) 101 - 111 mmol/L   CO2 31 22 - 32 mmol/L   Glucose, Bld 140 (H) 65 - 99 mg/dL   BUN 29 (H) 6 - 20 mg/dL   Creatinine, Ser 1.11 (H) 0.44 - 1.00 mg/dL   Calcium 9.7 8.9 - 10.3 mg/dL   Total Protein 7.9 6.5 - 8.1 g/dL   Albumin 4.2 3.5 - 5.0 g/dL   AST 25 15 - 41 U/L   ALT 15 14 - 54 U/L   Alkaline Phosphatase 58 38 - 126 U/L   Total Bilirubin 1.0 0.3 - 1.2 mg/dL   GFR calc non Af Amer 44 (L) >60 mL/min   GFR calc Af Amer 51 (L) >60 mL/min    Comment: (NOTE) The eGFR has been calculated using the CKD EPI equation. This calculation has not been validated in all clinical situations. eGFR's persistently <60 mL/min signify possible Chronic Kidney Disease.    Anion gap 11 5 - 15  Lipase, blood     Status: None   Collection Time: 03/28/15  9:20 AM  Result Value Ref Range   Lipase 17 11 - 51 U/L  Troponin I     Status: None   Collection Time: 03/28/15  9:20 AM  Result Value Ref Range   Troponin I 0.03 <0.031 ng/mL    Comment:        NO INDICATION OF MYOCARDIAL INJURY.   I-Stat CG4 Lactic Acid, ED     Status: None   Collection Time: 03/28/15 11:34 AM  Result Value Ref Range   Lactic Acid, Venous 1.11 0.5 - 2.0 mmol/L   Ct Abdomen Pelvis W Contrast  03/28/2015  CLINICAL DATA:  Nausea and vomiting. Alternating constipation and diarrhea. Weight loss. Night sweats. EXAM: CT ABDOMEN AND PELVIS WITH CONTRAST TECHNIQUE: Multidetector CT imaging of the abdomen and pelvis was performed using the standard protocol following bolus administration of intravenous contrast. CONTRAST:  47m OMNIPAQUE IOHEXOL 300 MG/ML SOLN, 1052mOMNIPAQUE IOHEXOL 300 MG/ML SOLN COMPARISON:   Radiographs dated 03/28/2015 and report of CT scan of the abdomen dated 03/08/2002 FINDINGS: Lower chest: No acute abnormalities. Minimal chronic lung disease on the right. Extensive calcification in the mitral valve annulus.  AICD in place. Hepatobiliary: Cholecystectomy. Normal liver parenchyma. No biliary ductal dilatation. Pancreas: Diffuse pancreatic atrophy. Spleen: Normal. Adrenals/Urinary Tract: 13 mm adenoma in the right adrenal gland, previously reported. Severe atrophy of the left kidney with compensatory hypertrophy of the right kidney. This is probably due to vascular insufficiency. Prominent right renal pelvis. The ureters are not dilated. Bladder is normal. Stomach/Bowel: There is a small bowel obstruction due to a left periumbilical hernia containing small bowel. Transition point is seen on images 47 through 49 of series 2. There are multiple surgical staple lines from previous bowel anastomoses in the ascending colon and in the distal small bowel. The patient does have a history of ovarian cancer. Vascular/Lymphatic: Extensive aortic atherosclerosis. Reproductive: Uterus and ovaries have been removed. Other: No free air or free fluid. Musculoskeletal: No acute abnormalities. 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 Lumbar scoliosis with diffuse degenerative disc and joint disease in the lumbar spine. IMPRESSION: 1. Small bowel obstruction at the site of the left periumbilical hernia. 2. Chronic atrophy of the left kidney most likely due to renal vascular insufficiency. 3. 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 Critical Value/emergent results were called by telephone at the time of interpretation on 03/28/2015 at 12:17 pm to Dr. Lyndal Pulley , who verbally acknowledged these results. Electronically Signed   By: Francene Boyers M.D.   On: 03/28/2015 12:17   Dg Abd Acute W/chest  03/28/2015  CLINICAL DATA:  Nausea and diarrhea for 3 days, nonsmoker. EXAM: DG ABDOMEN  ACUTE W/ 1V CHEST COMPARISON:  Chest x-rays dated 03/18/2015 02/17/2015. Comparison also made to earlier chest x-rays from 2012. FINDINGS: SINGLE VIEW OF THE CHEST: Heart size is upper normal, unchanged. Overall cardiomediastinal silhouette is stable in size and configuration. Atherosclerotic changes again noted at the aortic arch. Left chest wall pacemaker/AICD is stable in position. Lungs are hyperexpanded suggesting COPD. Streaky markings at the right lung base are unchanged compared to older exams suggesting chronic scarring/ fibrosis. Chronic bronchitic changes seen centrally. No evidence of acute pulmonary abnormality. No pleural effusion. No pneumothorax. TWO VIEWS OF THE ABDOMEN: Overall bowel gas pattern is nonobstructive. Moderate amount of stool and gas within the colon. No evidence of small bowel dilatation seen. No evidence of free intraperitoneal air. No evidence of soft tissue mass or abnormal fluid collection. Extensive degenerative changes seen throughout the scoliotic thoracolumbar spine. Atherosclerotic changes are seen along the walls of the grossly normal- caliber abdominal aorta. Degenerative changes also noted at the right hip joint with associated degenerative subchondral cyst formation. No acute-appearing osseous abnormality IMPRESSION: 1. Hyperexpanded lungs suggesting COPD. Probable associated scarring/fibrosis within each lung and central chronic bronchitic change. No evidence of acute cardiopulmonary abnormality. 2. Nonobstructive bowel gas pattern and no evidence of acute intra-abdominal abnormality seen. Electronically Signed   By: Bary Richard M.D.   On: 03/28/2015 10:08       Assessment/Plan 1. sbo secondary to incarcerated incisional hernia (though still has possibility of adhesions causing obstruction)  -this is now reduced by physical exam.  Each defect is palpable with no contents identified.  -given her husband's recent death and his funeral being delayed for her  admission, we will observe her over night and make sure she improves.  If she does, hopefully she can be discharged tomorrow and plan for elective hernia repair as an outpatient.  If this recurs or she worsens, then she may require an operation while she is here 2. MMP  -resume her  synthroid IV right now.    -hold her other meds  -IV metoprolol scheduled and will add hydralazine prn 3. DVT prophylaxis  SCDs/lovenox 4.  History of stage IV ovarian cancer  Surgery 2001.  Treating physicians were Drs. Carlena Bjornstad and Magrinat, but she has done well enough, they have released her from their care. 5.  Cards -   Dr. Sallyanne Kuster replaced pacemaker in 07/31/2013  History of nonischemic cardiomyopathy and had a reported left ventricular ejection fraction as low as 20% in the past - but with pacer, her EF returned to normal 6.  She uses a 4 posted walker to get around    OSBORNE,KELLY E 03/28/2015, 4:36 PM Pager: [phone]  Agree with above. Son, Jyrah Blye and daughter in law at bedside. Her PCP was Dr. Maceo Pro.  He has retired and now she sees Dr. Jacinto Reap. [place].  Alphonsa Overall, MD, [place] Surgery Pager: [phone] Office phone:  [phone]

## 2015-03-28 NOTE — ED Provider Notes (Signed)
CSN: [id number]     Arrival date & time 03/28/15  [id number] History   First MD Initiated Contact with Patient 03/28/15 0902     Chief Complaint  Patient presents with  . Emesis     (Consider location/radiation/quality/duration/timing/severity/associated sxs/prior Treatment) Patient is a 81 y.o. female presenting with vomiting. The history is provided by the patient.  Emesis Severity:  Moderate Duration:  3 days Timing:  Constant Number of daily episodes:  20x Quality:  Stomach contents Progression:  Worsening Chronicity:  New Relieved by:  Nothing Worsened by:  Nothing tried Ineffective treatments:  None tried Associated symptoms: diarrhea (and now constipation, last BM 2 days ago )   Associated symptoms: no abdominal pain and no fever   Risk factors: prior abdominal surgery   Risk factors: no diabetes     Past Medical History  Diagnosis Date  . AICD (automatic cardioverter/defibrillator) present 06/27/2007    medtronic  . Thyroid disease   . Cancer     ovarian--stage 4  . Cardiomyopathy   . CHF (congestive heart failure)   . Systemic hypertension   . Peripheral neuropathy    Past Surgical History  Procedure Laterality Date  . Appendectomy    . Cholecystectomy    . Cardiac defibrillator placement  06/27/2007    medtronic concerto  . Abdominal hysterectomy  oct 2000    along with ovarian ca surg  . Cardiac catheterization  09/27/1990    normal coronaries,dilated cardiomyopathy  . Nm myocar perf wall motion  06/05/2007    no ischemia  . Implantable cardioverter defibrillator generator change N/A 07/31/2013    Procedure: IMPLANTABLE CARDIOVERTER DEFIBRILLATOR GENERATOR CHANGE;  Surgeon: Sanda Klein, MD;  Location: [facility] CATH LAB;  Service: Cardiovascular;  Laterality: N/A;   Family History  Problem Relation Age of Onset  . Heart disease Paternal Grandmother   . Lung cancer Mother     died from cancer  . Lung cancer Father    Social History  Substance Use Topics  .  Smoking status: Never Smoker   . Smokeless tobacco: Not on file  . Alcohol Use: No   OB History    No data available     Review of Systems  Gastrointestinal: Positive for vomiting and diarrhea (and now constipation, last BM 2 days ago ). Negative for abdominal pain.  All other systems reviewed and are negative.     Allergies  Review of patient's allergies indicates no known allergies.  Home Medications   Prior to Admission medications   Medication Sig Start Date End Date Taking? Authorizing Provider  alendronate (FOSAMAX) 70 MG tablet Take 70 mg by mouth every 7 (seven) days. Take with a full glass of water on an empty stomach. Last taken Sunday 12/13/2010    Historical Provider, MD  aspirin 81 MG chewable tablet Chew 81 mg by mouth daily.      Historical Provider, MD  calcium-vitamin D (OSCAL WITH D) 250-125 MG-UNIT per tablet Take 1 tablet by mouth daily.      Historical Provider, MD  carvedilol (COREG) 12.5 MG tablet Take 1 tablet (12.5 mg total) by mouth 2 (two) times daily with a meal. 10/29/14   Mihai Croitoru, MD  carvedilol (COREG) 12.5 MG tablet TAKE 1 TABLET DAILY 12/03/14   Mihai Croitoru, MD  co-enzyme Q-10 50 MG capsule Take 50 mg by mouth daily.      Historical Provider, MD  digoxin (LANOXIN) 0.25 MG tablet TAKE 1 TABLET DAILY 12/03/14   Mihai Croitoru,  MD  furosemide (LASIX) 20 MG tablet TAKE ONE-HALF (1/2) TABLET DAILY 11/13/14   Mihai Croitoru, MD  latanoprost (XALATAN) 0.005 % ophthalmic solution Place 1 drop into both eyes at bedtime. 03/05/14   Historical Provider, MD  levothyroxine (SYNTHROID, LEVOTHROID) 137 MCG tablet Take 137 mcg by mouth daily.    Historical Provider, MD  liothyronine (CYTOMEL) 25 MCG tablet Take 25 mcg by mouth daily.  12/13/11   Historical Provider, MD  losartan (COZAAR) 100 MG tablet TAKE 1 TABLET DAILY 12/03/14   Mihai Croitoru, MD  mometasone-formoterol (DULERA) 100-5 MCG/ACT AERO Inhale 2 puffs into the lungs 2 (two) times daily. 12/31/11   Kathee Delton, MD  Omega-3 Fatty Acids (FISH OIL PO) Take 1 capsule by mouth daily.    Historical Provider, MD  OXYCODONE HCL PO Take by mouth as needed.    Historical Provider, MD  PREDNISONE PO Take 1 tablet by mouth daily. For 1 month.    Historical Provider, MD  vitamin C (ASCORBIC ACID) 500 MG tablet Take 500 mg by mouth daily.     Historical Provider, MD   BP 181/83 mmHg  Pulse 74  Temp(Src) 98.1 F (36.7 C) (Oral)  Resp 18  Ht 5\' 2"  (1.575 m)  Wt 134 lb (60.782 kg)  BMI 24.50 kg/m2  SpO2 93% Physical Exam  Constitutional: She is oriented to person, place, and time. She appears well-developed and well-nourished. No distress.  HENT:  Head: Normocephalic.  Eyes: Conjunctivae are normal.  Neck: Neck supple. No tracheal deviation present.  Cardiovascular: Normal rate, regular rhythm and normal heart sounds.   Pulmonary/Chest: Effort normal and breath sounds normal. No respiratory distress.  Abdominal: She exhibits distension. There is tenderness (diffuse). There is no rebound and no guarding.  Multiple well-healed surgical scars  Neurological: She is alert and oriented to person, place, and time.  Skin: Skin is warm and dry.  Psychiatric: She has a normal mood and affect.    ED Course  Procedures (including critical care time) Labs Review Labs Reviewed  CBC WITH DIFFERENTIAL/PLATELET - Abnormal; Notable for the following:    Hemoglobin 15.1 (*)    Monocytes Absolute 1.2 (*)    All other components within normal limits  COMPREHENSIVE METABOLIC PANEL - Abnormal; Notable for the following:    Sodium 132 (*)    Chloride 90 (*)    Glucose, Bld 140 (*)    BUN 29 (*)    Creatinine, Ser 1.11 (*)    GFR calc non Af Amer 44 (*)    GFR calc Af Amer 51 (*)    All other components within normal limits  LIPASE, BLOOD  TROPONIN I  URINALYSIS, ROUTINE W REFLEX MICROSCOPIC (NOT AT [facility])  I-STAT CG4 LACTIC ACID, ED    Imaging Review Ct Abdomen Pelvis W Contrast  03/28/2015  CLINICAL  DATA:  Nausea and vomiting. Alternating constipation and diarrhea. Weight loss. Night sweats. EXAM: CT ABDOMEN AND PELVIS WITH CONTRAST TECHNIQUE: Multidetector CT imaging of the abdomen and pelvis was performed using the standard protocol following bolus administration of intravenous contrast. CONTRAST:  19mL OMNIPAQUE IOHEXOL 300 MG/ML SOLN, 156mL OMNIPAQUE IOHEXOL 300 MG/ML SOLN COMPARISON:  Radiographs dated 03/28/2015 and report of CT scan of the abdomen dated 03/08/2002 FINDINGS: Lower chest: No acute abnormalities. Minimal chronic lung disease on the right. Extensive calcification in the mitral valve annulus. AICD in place. Hepatobiliary: Cholecystectomy. Normal liver parenchyma. No biliary ductal dilatation. Pancreas: Diffuse pancreatic atrophy. Spleen: Normal. Adrenals/Urinary Tract: 13 mm  adenoma in the right adrenal gland, previously reported. Severe atrophy of the left kidney with compensatory hypertrophy of the right kidney. This is probably due to vascular insufficiency. Prominent right renal pelvis. The ureters are not dilated. Bladder is normal. Stomach/Bowel: There is a small bowel obstruction due to a left periumbilical hernia containing small bowel. Transition point is seen on images 47 through 49 of series 2. There are multiple surgical staple lines from previous bowel anastomoses in the ascending colon and in the distal small bowel. The patient does have a history of ovarian cancer. Vascular/Lymphatic: Extensive aortic atherosclerosis. Reproductive: Uterus and ovaries have been removed. Other: No free air or free fluid. Musculoskeletal: No acute abnormalities. 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 Lumbar scoliosis with diffuse degenerative disc and joint disease in the lumbar spine. IMPRESSION: 1. Small bowel obstruction at the site of the left periumbilical hernia. 2. Chronic atrophy of the left kidney most likely due to renal vascular insufficiency. 3. Stage IV avascular  necrosis of the right femoral head. Critical Value/emergent results were called by telephone at the time of interpretation on 03/28/2015 at 12:17 pm to Dr. Leo Grosser , who verbally acknowledged these results. Electronically Signed   By: Lorriane Shire M.D.   On: 03/28/2015 12:17   Dg Abd Acute W/chest  03/28/2015  CLINICAL DATA:  Nausea and diarrhea for 3 days, nonsmoker. EXAM: DG ABDOMEN ACUTE W/ 1V CHEST COMPARISON:  Chest x-rays dated 03/18/2015 02/17/2015. Comparison also made to earlier chest x-rays from 2012. FINDINGS: SINGLE VIEW OF THE CHEST: Heart size is upper normal, unchanged. Overall cardiomediastinal silhouette is stable in size and configuration. Atherosclerotic changes again noted at the aortic arch. Left chest wall pacemaker/AICD is stable in position. Lungs are hyperexpanded suggesting COPD. Streaky markings at the right lung base are unchanged compared to older exams suggesting chronic scarring/ fibrosis. Chronic bronchitic changes seen centrally. No evidence of acute pulmonary abnormality. No pleural effusion. No pneumothorax. TWO VIEWS OF THE ABDOMEN: Overall bowel gas pattern is nonobstructive. Moderate amount of stool and gas within the colon. No evidence of small bowel dilatation seen. No evidence of free intraperitoneal air. No evidence of soft tissue mass or abnormal fluid collection. Extensive degenerative changes seen throughout the scoliotic thoracolumbar spine. Atherosclerotic changes are seen along the walls of the grossly normal- caliber abdominal aorta. Degenerative changes also noted at the right hip joint with associated degenerative subchondral cyst formation. No acute-appearing osseous abnormality IMPRESSION: 1. Hyperexpanded lungs suggesting COPD. Probable associated scarring/fibrosis within each lung and central chronic bronchitic change. No evidence of acute cardiopulmonary abnormality. 2. Nonobstructive bowel gas pattern and no evidence of acute intra-abdominal  abnormality seen. Electronically Signed   By: Franki Cabot M.D.   On: 03/28/2015 10:08   I have personally reviewed and evaluated these images and lab results as part of my medical decision-making.   EKG Interpretation None      MDM   Final diagnoses:  Emesis  Small bowel obstruction ([place])  Periumbilical hernia    81 year old female with multiple remote abdominal surgeries presents with nausea and vomiting worsening over the last 3 days. Last bowel movement was 2 days ago. She is diffusely tender with severe refractory nausea. Lab values consistent with mild dehydration but do not suspect cardiac etiology currently. Plain films ordered to evaluate for obstructive bowel gas pattern given high clinical suspicion for small bowel obstruction. No evidence on plain film of obstructive pattern so CT scan ordered for further elucidation. Patient  has small. Umbilical hernia and small bowel obstruction. Gen. surgery consult and recommended medical admission. I discussed with Dr. Algis Liming of the hospitalists who does not feel the patient will require medical admission and deferred to surgery. Gen. surgery team with Dr. Lucia Gaskins recommended the patient be transferred to the [facility] emergency department for evaluation after NG tube placement. I spoke with Dr. Alvino Chapel who accepted the patient in transfer to the ED for further evaluation by general surgery in stable condition.    Leo Grosser, MD 03/28/15 [phone]

## 2015-03-28 NOTE — ED Provider Notes (Signed)
Please see previous physician's note regarding patient's presenting history/physical, initial Ed and associated MDM. In short this 81 year old female with multiple abdominal surgeries who presents with 3 days n/v and constipation. Found to have SBO at a left periumbilical hernia. Transfer to [facility] ED for surgical evaluation.  VS stable in ED. She has soft abdomen, nonperitoneal, minimally tender. NGT in place. Dr. Lucia Gaskins consulted on arrival, and patient admitted to surgical service.  Forde Dandy, MD 03/28/15 [phone]

## 2015-03-28 NOTE — ED Notes (Signed)
Vomiting and diarrhea x several days  Now thinks is constipated

## 2015-03-28 NOTE — ED Notes (Signed)
2 episodes of vomiting both after getting zofran IV  .  Family has remained at bedside

## 2015-03-28 NOTE — ED Notes (Signed)
Report given to chg nurse at [facility] ER

## 2015-03-28 NOTE — ED Notes (Signed)
Alert and oriented  Family to room with pt.  Vomiting x sevsral days and some diarrhea   now complains of constipatiation since  yesterday .  Denies any pain.    Husband died yesterday

## 2015-03-28 NOTE — ED Notes (Signed)
PT CAN GO TO FLOOR AT 18:18

## 2015-03-29 ENCOUNTER — Observation Stay (HOSPITAL_COMMUNITY): Payer: Medicare Other

## 2015-03-29 DIAGNOSIS — K5660 Unspecified intestinal obstruction: Secondary | ICD-10-CM | POA: Diagnosis not present

## 2015-03-29 DIAGNOSIS — K430 Incisional hernia with obstruction, without gangrene: Secondary | ICD-10-CM | POA: Diagnosis not present

## 2015-03-29 LAB — BASIC METABOLIC PANEL
ANION GAP: 10 (ref 5–15)
BUN: 32 mg/dL — ABNORMAL HIGH (ref 6–20)
CHLORIDE: 95 mmol/L — AB (ref 101–111)
CO2: 28 mmol/L (ref 22–32)
Calcium: 8.2 mg/dL — ABNORMAL LOW (ref 8.9–10.3)
Creatinine, Ser: 0.91 mg/dL (ref 0.44–1.00)
GFR calc non Af Amer: 56 mL/min — ABNORMAL LOW (ref >60)
Glucose, Bld: 130 mg/dL — ABNORMAL HIGH (ref 65–99)
Potassium: 3.4 mmol/L — ABNORMAL LOW (ref 3.5–5.1)
Sodium: 133 mmol/L — ABNORMAL LOW (ref 135–145)

## 2015-03-29 LAB — CBC
HCT: 39.8 % (ref 36.0–46.0)
HEMOGLOBIN: 13.2 g/dL (ref 12.0–15.0)
MCH: 30.6 pg (ref 26.0–34.0)
MCHC: 33.2 g/dL (ref 30.0–36.0)
MCV: 92.1 fL (ref 78.0–100.0)
Platelets: 197 10*3/uL (ref 150–400)
RBC: 4.32 MIL/uL (ref 3.87–5.11)
RDW: 12.8 % (ref 11.5–15.5)
WBC: 5.3 10*3/uL (ref 4.0–10.5)

## 2015-03-29 MED ORDER — METOPROLOL TARTRATE 1 MG/ML IV SOLN
5.0000 mg | Freq: Four times a day (QID) | INTRAVENOUS | Status: DC | PRN
  Filled 2015-03-29: qty 5

## 2015-03-29 MED ORDER — LEVOTHYROXINE SODIUM 100 MCG PO TABS
100.0000 ug | ORAL_TABLET | Freq: Every day | ORAL | Status: DC
  Administered 2015-03-30: 100 ug via ORAL
  Filled 2015-03-29 (×3): qty 1

## 2015-03-29 MED ORDER — DIGOXIN 250 MCG PO TABS
250.0000 ug | ORAL_TABLET | Freq: Every day | ORAL | Status: DC
  Administered 2015-03-29 – 2015-03-30 (×2): 250 ug via ORAL
  Filled 2015-03-29 (×2): qty 1

## 2015-03-29 MED ORDER — CARVEDILOL 12.5 MG PO TABS
12.5000 mg | ORAL_TABLET | Freq: Every day | ORAL | Status: DC
  Administered 2015-03-29 – 2015-03-30 (×2): 12.5 mg via ORAL
  Filled 2015-03-29 (×2): qty 1

## 2015-03-29 MED ORDER — FUROSEMIDE 20 MG PO TABS
10.0000 mg | ORAL_TABLET | Freq: Every day | ORAL | Status: DC
  Administered 2015-03-29 – 2015-03-30 (×2): 10 mg via ORAL
  Filled 2015-03-29 (×2): qty 0.5

## 2015-03-29 MED ORDER — LOSARTAN POTASSIUM 50 MG PO TABS
100.0000 mg | ORAL_TABLET | Freq: Every day | ORAL | Status: DC
  Administered 2015-03-29 – 2015-03-30 (×2): 100 mg via ORAL
  Filled 2015-03-29 (×2): qty 2

## 2015-03-29 NOTE — Progress Notes (Signed)
Incarcerated incisional hernia  Subjective: Patient having flatus.  Denies abd pain.    Objective: Vital signs in last 24 hours: Temp:  [98.1 F (36.7 C)-98.4 F (36.9 C)] 98.3 F (36.8 C) (12/31 0503) Pulse Rate:  [64-81] 64 (12/31 0503) Resp:  [14-20] 15 (12/31 0503) BP: (157-203)/(65-80) 179/70 mmHg (12/31 0503) SpO2:  [92 %-98 %] 96 % (12/31 0503) Last BM Date: 03/27/15  Intake/Output from previous day: 12/30 0701 - 12/31 0700 In: 1200 [I.V.:1200] Out: 150 [Urine:150] Intake/Output this shift: Total I/O In: -  Out: 250 [Urine:250]  General appearance: alert and cooperative GI: soft, nontender NG with clear output  Lab Results:  Results for orders placed or performed during the hospital encounter of 03/28/15 (from the past 24 hour(s))  I-Stat CG4 Lactic Acid, ED     Status: None   Collection Time: 03/28/15 11:34 AM  Result Value Ref Range   Lactic Acid, Venous 1.11 0.5 - 2.0 mmol/L  CBC     Status: None   Collection Time: 03/28/15  8:20 PM  Result Value Ref Range   WBC 5.9 4.0 - 10.5 K/uL   RBC 4.57 3.87 - 5.11 MIL/uL   Hemoglobin 14.0 12.0 - 15.0 g/dL   HCT 41.4 36.0 - 46.0 %   MCV 90.6 78.0 - 100.0 fL   MCH 30.6 26.0 - 34.0 pg   MCHC 33.8 30.0 - 36.0 g/dL   RDW 12.7 11.5 - 15.5 %   Platelets 207 150 - 400 K/uL  Creatinine, serum     Status: Abnormal   Collection Time: 03/28/15  8:20 PM  Result Value Ref Range   Creatinine, Ser 1.09 (H) 0.44 - 1.00 mg/dL   GFR calc non Af Amer 45 (L) >60 mL/min   GFR calc Af Amer 52 (L) >60 mL/min  Basic metabolic panel     Status: Abnormal   Collection Time: 03/29/15  5:33 AM  Result Value Ref Range   Sodium 133 (L) 135 - 145 mmol/L   Potassium 3.4 (L) 3.5 - 5.1 mmol/L   Chloride 95 (L) 101 - 111 mmol/L   CO2 28 22 - 32 mmol/L   Glucose, Bld 130 (H) 65 - 99 mg/dL   BUN 32 (H) 6 - 20 mg/dL   Creatinine, Ser 0.91 0.44 - 1.00 mg/dL   Calcium 8.2 (L) 8.9 - 10.3 mg/dL   GFR calc non Af Amer 56 (L) >60 mL/min   GFR  calc Af Amer >60 >60 mL/min   Anion gap 10 5 - 15  CBC     Status: None   Collection Time: 03/29/15  5:33 AM  Result Value Ref Range   WBC 5.3 4.0 - 10.5 K/uL   RBC 4.32 3.87 - 5.11 MIL/uL   Hemoglobin 13.2 12.0 - 15.0 g/dL   HCT 39.8 36.0 - 46.0 %   MCV 92.1 78.0 - 100.0 fL   MCH 30.6 26.0 - 34.0 pg   MCHC 33.2 30.0 - 36.0 g/dL   RDW 12.8 11.5 - 15.5 %   Platelets 197 150 - 400 K/uL     Studies/Results Radiology     MEDS, Scheduled . carvedilol  12.5 mg Oral Daily  . digoxin  250 mcg Oral Daily  . enoxaparin (LOVENOX) injection  40 mg Subcutaneous Q24H  . furosemide  10 mg Oral Daily  . latanoprost  1 drop Both Eyes QHS  . levothyroxine  100 mcg Oral QAC breakfast  . losartan  100 mg Oral Daily  .  pantoprazole (PROTONIX) IV  40 mg Intravenous QHS     Assessment: Incarcerated incisional hernia AXR shows contrast in colon  Plan: D/C NG and start clears Ambulate      Rosario Adie, MD [facility], [place]   03/29/2015 10:14 AM

## 2015-03-29 NOTE — Progress Notes (Signed)
Utilization Review Completed.Elice Crigger T12/31/2016  

## 2015-03-30 DIAGNOSIS — K5660 Unspecified intestinal obstruction: Secondary | ICD-10-CM | POA: Diagnosis not present

## 2015-03-30 MED ORDER — POLYETHYLENE GLYCOL 3350 17 G PO PACK
17.0000 g | PACK | Freq: Every day | ORAL | Status: DC
  Administered 2015-03-30: 17 g via ORAL
  Filled 2015-03-30: qty 1

## 2015-03-30 MED ORDER — OXYCODONE HCL 5 MG PO TABS
15.0000 mg | ORAL_TABLET | Freq: Four times a day (QID) | ORAL | Status: DC | PRN
  Administered 2015-03-30: 15 mg via ORAL
  Filled 2015-03-30: qty 3

## 2015-03-30 NOTE — Discharge Instructions (Signed)
[facility], [place]   Always review your discharge instruction sheet given to you by the facility where your surgery was performed.  IF YOU HAVE DISABILITY OR FAMILY LEAVE FORMS, YOU MUST BRING THEM TO THE OFFICE FOR PROCESSING.  PLEASE DO NOT GIVE THEM TO YOUR DOCTOR.  1. Take your usually prescribed medications unless otherwise directed. 2. If you need a refill on your pain medication, please contact your pharmacy. They will contact our office to request authorization.  Prescriptions will not be filled after 5pm or on week-ends. 3. You should follow a light full liquid diet the first 2 days after arrival home, such as soup and crackers, pudding, etc.unless your doctor has advised otherwise.  Be sure to include lots of fluids daily 4. ACTIVITIES:  You may resume regular (light) daily activities beginning the next day--such as daily self-care, walking, climbing stairs--gradually increasing activities as tolerated.  You may have sexual intercourse when it is comfortable.  Refrain from any heavy lifting or straining until approved by your doctor. a. You may drive when you no longer are taking prescription pain medication, you can comfortably wear a seatbelt, and you can safely maneuver your car and apply brakes b. Return to Work: ___________________________________ 5. You should see your doctor in the office for a follow-up appointment approximately two weeks after your surgery.  Make sure that you call for this appointment within a day or two after you arrive home to insure a convenient appointment time. OTHER INSTRUCTIONS:  _____________________________________________________________ _____________________________________________________________  WHEN TO CALL YOUR DOCTOR: 1. Fever over 101.0 2. Inability to urinate 3. Nausea and/or vomiting 4. Extreme swelling or bruising 5. Continued bleeding from incision. 6. Increased pain, redness, or drainage from the  incision. 7. Difficulty swallowing or breathing 8. Muscle cramping or spasms. 9. Numbness or tingling in hands or feet or around lips.  The clinic staff is available to answer your questions during regular business hours.  Please dont hesitate to call and ask to speak to one of the nurses if you have concerns.  For further questions, please visit [email or web address]   Small Bowel Obstruction A small bowel obstruction is a blockage in the small bowel. The small bowel, which is also called the small intestine, is a long, slender tube that connects the stomach to the colon. When a person eats and drinks, food and fluids go from the stomach to the small bowel. This is where most of the nutrients in the food and fluids are absorbed. A small bowel obstruction will prevent food and fluids from passing through the small bowel as they normally do during digestion. The small bowel can become partially or completely blocked. This can cause symptoms such as abdominal pain, vomiting, and bloating. If this condition is not treated, it can be dangerous because the small bowel could rupture. CAUSES Common causes of this condition include:  Scar tissue from previous surgery or radiation treatment.  Recent surgery. This may cause the movements of the bowel to slow down and cause food to block the intestine.  Hernias.  Inflammatory bowel disease (colitis).  Twisting of the bowel (volvulus).  Tumors.  A foreign body.  Slipping of a part of the bowel into another part (intussusception). SYMPTOMS Symptoms of this condition include:  Abdominal pain. This may be dull cramps or sharp pain. It may occur in one area, or it may be present in the entire abdomen. Pain can range from mild to severe, depending on  the degree of obstruction.  Nausea and vomiting. Vomit may be greenish or a yellow bile color.  Abdominal bloating.  Constipation.  Lack of passing gas.  Frequent  belching.  Diarrhea. This may occur if the obstruction is partial and runny stool is able to leak around the obstruction. DIAGNOSIS This condition may be diagnosed based on a physical exam, medical history, and X-rays of the abdomen. You may also have other tests, such as a CT scan of the abdomen and pelvis. TREATMENT Treatment for this condition depends on the cause and severity of the problem. Treatment options may include:  Bed rest along with fluids and pain medicines that are given through an IV tube inserted into one of your veins. Sometimes, this is all that is needed for the obstruction to improve.  Following a simple diet. In some cases, a clear liquid diet may be required for several days. This allows the bowel to rest.  Placement of a small tube (nasogastric tube) into the stomach. When the bowel is blocked, it usually swells up like a balloon that is filled with air and fluids. The air and fluids may be removed by suction through the nasogastric tube. This can help with pain, discomfort, and nausea. It can also help the obstruction to clear up faster.  Surgery. This may be required if other treatments do not work. Bowel obstruction from a hernia may require early surgery and can be an emergency procedure. Surgery may also be required for scar tissue that causes frequent or severe obstructions. HOME CARE INSTRUCTIONS  Get plenty of rest.  Follow instructions from your health care provider about eating restrictions. You may need to avoid solid foods and consume only clear liquids until your condition improves.  Take over-the-counter and prescription medicines only as told by your health care provider.  Keep all follow-up visits as told by your health care provider. This is important. SEEK MEDICAL CARE IF:  You have a fever.  You have chills. SEEK IMMEDIATE MEDICAL CARE IF:  You have increased pain or cramping.  You vomit blood.  You have uncontrolled vomiting or  nausea.  You cannot drink fluids because of vomiting or pain.  You develop confusion.  You begin feeling very dry or thirsty (dehydrated).  You have severe bloating.  You feel extremely weak or you faint.   This information is not intended to replace advice given to you by your health care provider. Make sure you discuss any questions you have with your health care provider.   Document Released: 06/01/2005 Document Revised: 12/04/2014 Document Reviewed: 05/09/2014 Elsevier Interactive Patient Education [employer].

## 2015-03-30 NOTE — Discharge Summary (Signed)
Physician Discharge Summary  [name] [id number] DOB: Aug 08, 1928 DOA: 03/28/2015  PCP: Martinique, BETTY G, MD  Admit date: 03/28/2015 Discharge date: 03/30/2015  Recommendations for Outpatient Follow-up:   Follow-up Information    Follow up with [facility] H, MD. Schedule an appointment as soon as possible for a visit in 2 weeks.   Specialty:  General Surgery   Contact information:   [place]  [address] [phone]      Discharge Diagnoses:  1. Partial small bowel obstructions 2. Incisional hernias 3. Hypertension 4. Chronic back pain 5. COPD  Surgical Procedure: none  Discharge Condition: good Disposition: home  Diet recommendation: full liquid/meal replacement shakes  Filed Weights   03/28/15 0859  Weight: 60.782 kg (134 lb)   History of Present Illness: This is a very pleasant 82 yo WF with several other medical problems who has had severe nausea for the last 3 days. She did not have any emesis until this morning. She normally has a BM on a daily basis, but has not had one now in 3 days. She is not passing any flatus either. She denies any abdominal pain, fevers, chills, etc. Her husband just unexpectedly passed away 2 days ago. This morning, her nausea got so bad she was taken to the [facility] ED where she had a CT scan which revealed an incarcerated incisional hernia causing a SBO. She had an NGT placed and was transported here to [facility] for [name] to evaluate.  Hospital Course:  On exam her incisional hernias were reducible. It was felt that her psbo was likely more due to adhesions than her incisional hernias. Nonetheless, she was admitted. NG tube decompression started. The following morning on xray contrast was in her colon and she had had flatus. Her ng tube was removed and started on clears. By Sunday, she had had a bowel movement. She was advanced to full liquids which she tolerated for lunch without nausea/vomiting/abd pain. She was  felt stable for dc. We discussed staying on a full liquid diet/boost for 2 days and then advancing to a soft diet. We discussed what to call for. She voiced understanding.    Discharge Instructions  Discharge Instructions    Discharge instructions    Complete by:  As directed   Stay on a full liquid diet for 2 days. Drink plenty of liquids and meal replacement shake like boost. May then advance to soft diet     Increase activity slowly    Complete by:  As directed             Medication List    TAKE these medications        alendronate 70 MG tablet  Commonly known as:  FOSAMAX  Take 70 mg by mouth every 7 (seven) days.     calcium-vitamin D 250-125 MG-UNIT tablet  Commonly known as:  OSCAL WITH D  Take 1 tablet by mouth daily.     carvedilol 12.5 MG tablet  Commonly known as:  COREG  TAKE 1 TABLET DAILY     co-enzyme Q-10 50 MG capsule  Take 50 mg by mouth daily.     COMBIGAN 0.2-0.5 % ophthalmic solution  Generic drug:  brimonidine-timolol  Place 1 drop into both eyes 2 (two) times daily.     digoxin 0.25 MG tablet  Commonly known as:  LANOXIN  TAKE 1 TABLET DAILY     FISH OIL PO  Take 1 capsule by mouth daily.  FLUZONE HIGH-DOSE 0.5 ML Susy  Generic drug:  Influenza Vac Split High-Dose  once.     furosemide 20 MG tablet  Commonly known as:  LASIX  TAKE ONE-HALF (1/2) TABLET DAILY     latanoprost 0.005 % ophthalmic solution  Commonly known as:  XALATAN  Place 1 drop into both eyes at bedtime.     losartan 100 MG tablet  Commonly known as:  COZAAR  TAKE 1 TABLET DAILY     ondansetron 4 MG disintegrating tablet  Commonly known as:  ZOFRAN-ODT  Take 4 mg by mouth every 12 (twelve) hours as needed for nausea or vomiting.     oxyCODONE 15 MG immediate release tablet  Commonly known as:  ROXICODONE  Take 15 mg by mouth 5 (five) times daily as needed for pain.     SYNTHROID 100 MCG tablet  Generic drug:  levothyroxine  Take 100 mcg by mouth daily.      vitamin C 500 MG tablet  Commonly known as:  ASCORBIC ACID  Take 500 mg by mouth daily.           Follow-up Information    Follow up with [facility] H, MD. Schedule an appointment as soon as possible for a visit in 2 weeks.   Specialty:  General Surgery   Contact information:   [place] [address] [phone]        The results of significant diagnostics from this hospitalization (including imaging, microbiology, ancillary and laboratory) are listed below for reference.    Significant Diagnostic Studies: Ct Abdomen Pelvis W Contrast  03/28/2015  CLINICAL DATA:  Nausea and vomiting. Alternating constipation and diarrhea. Weight loss. Night sweats. EXAM: CT ABDOMEN AND PELVIS WITH CONTRAST TECHNIQUE: Multidetector CT imaging of the abdomen and pelvis was performed using the standard protocol following bolus administration of intravenous contrast. CONTRAST:  46mL OMNIPAQUE IOHEXOL 300 MG/ML SOLN, 152mL OMNIPAQUE IOHEXOL 300 MG/ML SOLN COMPARISON:  Radiographs dated 03/28/2015 and report of CT scan of the abdomen dated 03/08/2002 FINDINGS: Lower chest: No acute abnormalities. Minimal chronic lung disease on the right. Extensive calcification in the mitral valve annulus. AICD in place. Hepatobiliary: Cholecystectomy. Normal liver parenchyma. No biliary ductal dilatation. Pancreas: Diffuse pancreatic atrophy. Spleen: Normal. Adrenals/Urinary Tract: 13 mm adenoma in the right adrenal gland, previously reported. Severe atrophy of the left kidney with compensatory hypertrophy of the right kidney. This is probably due to vascular insufficiency. Prominent right renal pelvis. The ureters are not dilated. Bladder is normal. Stomach/Bowel: There is a small bowel obstruction due to a left periumbilical hernia containing small bowel. Transition point is seen on images 47 through 49 of series 2. There are multiple surgical staple lines from previous bowel anastomoses in the  ascending colon and in the distal small bowel. The patient does have a history of ovarian cancer. Vascular/Lymphatic: Extensive aortic atherosclerosis. Reproductive: Uterus and ovaries have been removed. Other: No free air or free fluid. Musculoskeletal: No acute abnormalities. 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 Lumbar scoliosis with diffuse degenerative disc and joint disease in the lumbar spine. IMPRESSION: 1. Small bowel obstruction at the site of the left periumbilical hernia. 2. Chronic atrophy of the left kidney most likely due to renal vascular insufficiency. 3. 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 Critical Value/emergent results were called by telephone at the time of interpretation on 03/28/2015 at 12:17 pm to Dr. Leo Grosser , who verbally acknowledged these results. Electronically Signed  By: Lorriane Shire M.D.   On: 03/28/2015 12:17   Dg Abd 2 Views  03/29/2015  CLINICAL DATA:  Small bowel obstruction due to incarcerated incisional hernia. EXAM: ABDOMEN - 2 VIEW COMPARISON:  CT scan and radiographs dated 03/28/2015 FINDINGS: NG tube is been inserted. Contrast from the prior CT scan has passed into the right side of the colon. There is still dilatation of small bowel loops in the mid abdomen. Contrast in the bladder from the prior CT scan. Severe degenerative changes in the spine. Avascular necrosis of the right femoral head. Extensive aortic atherosclerosis. IMPRESSION: Contrast has passed into the colon indicating that the obstruction is either relieved or only partial. There are still dilated small bowel loops in the mid abdomen. Electronically Signed   By: Lorriane Shire M.D.   On: 03/29/2015 08:05   Dg Abd Acute W/chest  03/28/2015  CLINICAL DATA:  Nausea and diarrhea for 3 days, nonsmoker. EXAM: DG ABDOMEN ACUTE W/ 1V CHEST COMPARISON:  Chest x-rays dated 03/18/2015 02/17/2015. Comparison also made to earlier chest x-rays from 2012. FINDINGS: SINGLE  VIEW OF THE CHEST: Heart size is upper normal, unchanged. Overall cardiomediastinal silhouette is stable in size and configuration. Atherosclerotic changes again noted at the aortic arch. Left chest wall pacemaker/AICD is stable in position. Lungs are hyperexpanded suggesting COPD. Streaky markings at the right lung base are unchanged compared to older exams suggesting chronic scarring/ fibrosis. Chronic bronchitic changes seen centrally. No evidence of acute pulmonary abnormality. No pleural effusion. No pneumothorax. TWO VIEWS OF THE ABDOMEN: Overall bowel gas pattern is nonobstructive. Moderate amount of stool and gas within the colon. No evidence of small bowel dilatation seen. No evidence of free intraperitoneal air. No evidence of soft tissue mass or abnormal fluid collection. Extensive degenerative changes seen throughout the scoliotic thoracolumbar spine. Atherosclerotic changes are seen along the walls of the grossly normal- caliber abdominal aorta. Degenerative changes also noted at the right hip joint with associated degenerative subchondral cyst formation. No acute-appearing osseous abnormality IMPRESSION: 1. Hyperexpanded lungs suggesting COPD. Probable associated scarring/fibrosis within each lung and central chronic bronchitic change. No evidence of acute cardiopulmonary abnormality. 2. Nonobstructive bowel gas pattern and no evidence of acute intra-abdominal abnormality seen. Electronically Signed   By: Franki Cabot M.D.   On: 03/28/2015 10:08    Microbiology: No results found for this or any previous visit (from the past 240 hour(s)).   Labs: Basic Metabolic Panel:  Recent Labs Lab 03/28/15 0920 03/28/15 2020 03/29/15 0533  NA 132*  --  133*  K 4.2  --  3.4*  CL 90*  --  95*  CO2 31  --  28  GLUCOSE 140*  --  130*  BUN 29*  --  32*  CREATININE 1.11* 1.09* 0.91  CALCIUM 9.7  --  8.2*   Liver Function Tests:  Recent Labs Lab 03/28/15 0920  AST 25  ALT 15  ALKPHOS 58   BILITOT 1.0  PROT 7.9  ALBUMIN 4.2    Recent Labs Lab 03/28/15 0920  LIPASE 17   No results for input(s): AMMONIA in the last 168 hours. CBC:  Recent Labs Lab 03/28/15 0920 03/28/15 2020 03/29/15 0533  WBC 6.8 5.9 5.3  NEUTROABS 4.8  --   --   HGB 15.1* 14.0 13.2  HCT 44.6 41.4 39.8  MCV 88.3 90.6 92.1  PLT 234 207 197   Cardiac Enzymes:  Recent Labs Lab 03/28/15 0920  TROPONINI 0.03   BNP: BNP (last  3 results) No results for input(s): BNP in the last 8760 hours.  ProBNP (last 3 results) No results for input(s): PROBNP in the last 8760 hours.  CBG: No results for input(s): GLUCAP in the last 168 hours.  Principal Problem:   Incarcerated incisional hernia Active Problems:   SBO (small bowel obstruction) ([place])   Time coordinating discharge: 15 minutes  Signed:  Gayland Curry, MD [facility], [place] [phone] 03/30/2015, 3:45 PM

## 2015-03-30 NOTE — Progress Notes (Signed)
Discharge instructions reviewed with patient utilizing teach back method no questions at this time. Patient discharged to home with daughter

## 2015-03-30 NOTE — Progress Notes (Signed)
Subjective: Feels a lot better today. No nausea. Tolerated clears. Had a BM. +flatus. No abd pain  Objective: Vital signs in last 24 hours: Temp:  [98 F (36.7 C)-98.4 F (36.9 C)] 98.1 F (36.7 C) (01/01 0523) Pulse Rate:  [59-69] 59 (01/01 0523) Resp:  [16-18] 18 (01/01 0523) BP: (155-177)/(62-105) 163/66 mmHg (01/01 0523) SpO2:  [93 %-96 %] 95 % (01/01 0523) Last BM Date: 03/27/15  Intake/Output from previous day: 12/31 0701 - 01/01 0700 In: 1255 [P.O.:480; I.V.:775] Out: 676 [Urine:676] Intake/Output this shift:    Alert, nad cta  Reg Soft, nt, reducible swiss cheese hernias, +BS No edema  Lab Results:   Recent Labs  03/28/15 2020 03/29/15 0533  WBC 5.9 5.3  HGB 14.0 13.2  HCT 41.4 39.8  PLT 207 197   BMET  Recent Labs  03/28/15 0920 03/28/15 2020 03/29/15 0533  NA 132*  --  133*  K 4.2  --  3.4*  CL 90*  --  95*  CO2 31  --  28  GLUCOSE 140*  --  130*  BUN 29*  --  32*  CREATININE 1.11* 1.09* 0.91  CALCIUM 9.7  --  8.2*   PT/INR No results for input(s): LABPROT, INR in the last 72 hours. ABG No results for input(s): PHART, HCO3 in the last 72 hours.  Invalid input(s): PCO2, PO2  Studies/Results: Ct Abdomen Pelvis W Contrast  03/28/2015  CLINICAL DATA:  Nausea and vomiting. Alternating constipation and diarrhea. Weight loss. Night sweats. EXAM: CT ABDOMEN AND PELVIS WITH CONTRAST TECHNIQUE: Multidetector CT imaging of the abdomen and pelvis was performed using the standard protocol following bolus administration of intravenous contrast. CONTRAST:  34mL OMNIPAQUE IOHEXOL 300 MG/ML SOLN, 11mL OMNIPAQUE IOHEXOL 300 MG/ML SOLN COMPARISON:  Radiographs dated 03/28/2015 and report of CT scan of the abdomen dated 03/08/2002 FINDINGS: Lower chest: No acute abnormalities. Minimal chronic lung disease on the right. Extensive calcification in the mitral valve annulus. AICD in place. Hepatobiliary: Cholecystectomy. Normal liver parenchyma. No biliary  ductal dilatation. Pancreas: Diffuse pancreatic atrophy. Spleen: Normal. Adrenals/Urinary Tract: 13 mm adenoma in the right adrenal gland, previously reported. Severe atrophy of the left kidney with compensatory hypertrophy of the right kidney. This is probably due to vascular insufficiency. Prominent right renal pelvis. The ureters are not dilated. Bladder is normal. Stomach/Bowel: There is a small bowel obstruction due to a left periumbilical hernia containing small bowel. Transition point is seen on images 47 through 49 of series 2. There are multiple surgical staple lines from previous bowel anastomoses in the ascending colon and in the distal small bowel. The patient does have a history of ovarian cancer. Vascular/Lymphatic: Extensive aortic atherosclerosis. Reproductive: Uterus and ovaries have been removed. Other: No free air or free fluid. Musculoskeletal: No acute abnormalities. 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 Lumbar scoliosis with diffuse degenerative disc and joint disease in the lumbar spine. IMPRESSION: 1. Small bowel obstruction at the site of the left periumbilical hernia. 2. Chronic atrophy of the left kidney most likely due to renal vascular insufficiency. 3. 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 Critical Value/emergent results were called by telephone at the time of interpretation on 03/28/2015 at 12:17 pm to Dr. Leo Grosser , who verbally acknowledged these results. Electronically Signed   By: Lorriane Shire M.D.   On: 03/28/2015 12:17   Dg Abd 2 Views  03/29/2015  CLINICAL DATA:  Small bowel obstruction due to incarcerated incisional hernia. EXAM: ABDOMEN -  2 VIEW COMPARISON:  CT scan and radiographs dated 03/28/2015 FINDINGS: NG tube is been inserted. Contrast from the prior CT scan has passed into the right side of the colon. There is still dilatation of small bowel loops in the mid abdomen. Contrast in the bladder from the prior CT scan. Severe  degenerative changes in the spine. Avascular necrosis of the right femoral head. Extensive aortic atherosclerosis. IMPRESSION: Contrast has passed into the colon indicating that the obstruction is either relieved or only partial. There are still dilated small bowel loops in the mid abdomen. Electronically Signed   By: Lorriane Shire M.D.   On: 03/29/2015 08:05   Dg Abd Acute W/chest  03/28/2015  CLINICAL DATA:  Nausea and diarrhea for 3 days, nonsmoker. EXAM: DG ABDOMEN ACUTE W/ 1V CHEST COMPARISON:  Chest x-rays dated 03/18/2015 02/17/2015. Comparison also made to earlier chest x-rays from 2012. FINDINGS: SINGLE VIEW OF THE CHEST: Heart size is upper normal, unchanged. Overall cardiomediastinal silhouette is stable in size and configuration. Atherosclerotic changes again noted at the aortic arch. Left chest wall pacemaker/AICD is stable in position. Lungs are hyperexpanded suggesting COPD. Streaky markings at the right lung base are unchanged compared to older exams suggesting chronic scarring/ fibrosis. Chronic bronchitic changes seen centrally. No evidence of acute pulmonary abnormality. No pleural effusion. No pneumothorax. TWO VIEWS OF THE ABDOMEN: Overall bowel gas pattern is nonobstructive. Moderate amount of stool and gas within the colon. No evidence of small bowel dilatation seen. No evidence of free intraperitoneal air. No evidence of soft tissue mass or abnormal fluid collection. Extensive degenerative changes seen throughout the scoliotic thoracolumbar spine. Atherosclerotic changes are seen along the walls of the grossly normal- caliber abdominal aorta. Degenerative changes also noted at the right hip joint with associated degenerative subchondral cyst formation. No acute-appearing osseous abnormality IMPRESSION: 1. Hyperexpanded lungs suggesting COPD. Probable associated scarring/fibrosis within each lung and central chronic bronchitic change. No evidence of acute cardiopulmonary abnormality. 2.  Nonobstructive bowel gas pattern and no evidence of acute intra-abdominal abnormality seen. Electronically Signed   By: Franki Cabot M.D.   On: 03/28/2015 10:08    Anti-infectives: Anti-infectives    None      Assessment/Plan: pSBO Incisional hernias  Appears SBO resolving.  Adv to full liquids Pt very motivated and wants to go to home. (husband recently passed) Advised her that if she did well this am and afternoon, we would dc her late this pm. Advised her she would go home on full liquid diet + meal replacement shakes (boost, etc) for 2 days before resuming soft diet. Pt voiced understanding. Also discussed what to call for  Leighton Ruff. Redmond Pulling, MD, FACS General, Bariatric, & Minimally Invasive Surgery [facility], [place]      [facility] Jerilynn Mages 03/30/2015

## 2015-04-01 DIAGNOSIS — Z634 Disappearance and death of family member: Secondary | ICD-10-CM | POA: Diagnosis not present

## 2015-04-01 DIAGNOSIS — M159 Polyosteoarthritis, unspecified: Secondary | ICD-10-CM | POA: Diagnosis not present

## 2015-04-01 DIAGNOSIS — R2681 Unsteadiness on feet: Secondary | ICD-10-CM | POA: Diagnosis not present

## 2015-04-01 DIAGNOSIS — K5669 Other intestinal obstruction: Secondary | ICD-10-CM | POA: Diagnosis not present

## 2015-04-01 DIAGNOSIS — E876 Hypokalemia: Secondary | ICD-10-CM | POA: Diagnosis not present

## 2015-04-01 DIAGNOSIS — J45909 Unspecified asthma, uncomplicated: Secondary | ICD-10-CM | POA: Diagnosis not present

## 2015-04-16 DIAGNOSIS — I509 Heart failure, unspecified: Secondary | ICD-10-CM | POA: Diagnosis not present

## 2015-04-16 DIAGNOSIS — Z8543 Personal history of malignant neoplasm of ovary: Secondary | ICD-10-CM | POA: Diagnosis not present

## 2015-04-16 DIAGNOSIS — G629 Polyneuropathy, unspecified: Secondary | ICD-10-CM | POA: Diagnosis not present

## 2015-04-16 DIAGNOSIS — Z8701 Personal history of pneumonia (recurrent): Secondary | ICD-10-CM | POA: Diagnosis not present

## 2015-04-16 DIAGNOSIS — E039 Hypothyroidism, unspecified: Secondary | ICD-10-CM | POA: Diagnosis not present

## 2015-04-16 DIAGNOSIS — G894 Chronic pain syndrome: Secondary | ICD-10-CM | POA: Diagnosis not present

## 2015-04-16 DIAGNOSIS — I10 Essential (primary) hypertension: Secondary | ICD-10-CM | POA: Diagnosis not present

## 2015-04-16 DIAGNOSIS — M810 Age-related osteoporosis without current pathological fracture: Secondary | ICD-10-CM | POA: Diagnosis not present

## 2015-04-16 DIAGNOSIS — R2689 Other abnormalities of gait and mobility: Secondary | ICD-10-CM | POA: Diagnosis not present

## 2015-04-16 DIAGNOSIS — J45909 Unspecified asthma, uncomplicated: Secondary | ICD-10-CM | POA: Diagnosis not present

## 2015-04-16 DIAGNOSIS — M150 Primary generalized (osteo)arthritis: Secondary | ICD-10-CM | POA: Diagnosis not present

## 2015-04-18 DIAGNOSIS — J45909 Unspecified asthma, uncomplicated: Secondary | ICD-10-CM | POA: Diagnosis not present

## 2015-04-18 DIAGNOSIS — G894 Chronic pain syndrome: Secondary | ICD-10-CM | POA: Diagnosis not present

## 2015-04-18 DIAGNOSIS — R2689 Other abnormalities of gait and mobility: Secondary | ICD-10-CM | POA: Diagnosis not present

## 2015-04-18 DIAGNOSIS — G629 Polyneuropathy, unspecified: Secondary | ICD-10-CM | POA: Diagnosis not present

## 2015-04-18 DIAGNOSIS — I509 Heart failure, unspecified: Secondary | ICD-10-CM | POA: Diagnosis not present

## 2015-04-18 DIAGNOSIS — M150 Primary generalized (osteo)arthritis: Secondary | ICD-10-CM | POA: Diagnosis not present

## 2015-04-19 DIAGNOSIS — I509 Heart failure, unspecified: Secondary | ICD-10-CM | POA: Diagnosis not present

## 2015-04-19 DIAGNOSIS — G629 Polyneuropathy, unspecified: Secondary | ICD-10-CM | POA: Diagnosis not present

## 2015-04-19 DIAGNOSIS — G894 Chronic pain syndrome: Secondary | ICD-10-CM | POA: Diagnosis not present

## 2015-04-19 DIAGNOSIS — M150 Primary generalized (osteo)arthritis: Secondary | ICD-10-CM | POA: Diagnosis not present

## 2015-04-19 DIAGNOSIS — R2689 Other abnormalities of gait and mobility: Secondary | ICD-10-CM | POA: Diagnosis not present

## 2015-04-19 DIAGNOSIS — J45909 Unspecified asthma, uncomplicated: Secondary | ICD-10-CM | POA: Diagnosis not present

## 2015-04-21 DIAGNOSIS — G894 Chronic pain syndrome: Secondary | ICD-10-CM | POA: Diagnosis not present

## 2015-04-21 DIAGNOSIS — J45909 Unspecified asthma, uncomplicated: Secondary | ICD-10-CM | POA: Diagnosis not present

## 2015-04-21 DIAGNOSIS — G629 Polyneuropathy, unspecified: Secondary | ICD-10-CM | POA: Diagnosis not present

## 2015-04-21 DIAGNOSIS — R2689 Other abnormalities of gait and mobility: Secondary | ICD-10-CM | POA: Diagnosis not present

## 2015-04-21 DIAGNOSIS — I509 Heart failure, unspecified: Secondary | ICD-10-CM | POA: Diagnosis not present

## 2015-04-21 DIAGNOSIS — M150 Primary generalized (osteo)arthritis: Secondary | ICD-10-CM | POA: Diagnosis not present

## 2015-04-22 ENCOUNTER — Encounter: Payer: Self-pay | Admitting: Cardiovascular Disease

## 2015-04-22 ENCOUNTER — Ambulatory Visit (INDEPENDENT_AMBULATORY_CARE_PROVIDER_SITE_OTHER): Payer: Medicare Other | Admitting: Cardiovascular Disease

## 2015-04-22 ENCOUNTER — Ambulatory Visit (HOSPITAL_COMMUNITY)
Admission: RE | Admit: 2015-04-22 | Discharge: 2015-04-22 | Disposition: A | Discharge status: Home / Self Care | Payer: Medicare Other | Source: Ambulatory Visit | Attending: Cardiology | Admitting: Cardiology

## 2015-04-22 VITALS — BP 138/76 | HR 54 | Ht 62.0 in | Wt 127.0 lb

## 2015-04-22 DIAGNOSIS — I82401 Acute embolism and thrombosis of unspecified deep veins of right lower extremity: Secondary | ICD-10-CM | POA: Diagnosis not present

## 2015-04-22 DIAGNOSIS — M7989 Other specified soft tissue disorders: Secondary | ICD-10-CM | POA: Diagnosis not present

## 2015-04-22 DIAGNOSIS — I5042 Chronic combined systolic (congestive) and diastolic (congestive) heart failure: Secondary | ICD-10-CM | POA: Diagnosis not present

## 2015-04-22 DIAGNOSIS — J449 Chronic obstructive pulmonary disease, unspecified: Secondary | ICD-10-CM

## 2015-04-22 DIAGNOSIS — Z95810 Presence of automatic (implantable) cardiac defibrillator: Secondary | ICD-10-CM

## 2015-04-22 DIAGNOSIS — I429 Cardiomyopathy, unspecified: Secondary | ICD-10-CM | POA: Diagnosis not present

## 2015-04-22 DIAGNOSIS — I428 Other cardiomyopathies: Secondary | ICD-10-CM

## 2015-04-22 DIAGNOSIS — I4892 Unspecified atrial flutter: Secondary | ICD-10-CM

## 2015-04-22 DIAGNOSIS — J45909 Unspecified asthma, uncomplicated: Secondary | ICD-10-CM

## 2015-04-22 LAB — CUP PACEART INCLINIC DEVICE CHECK
Brady Statistic AP VP Percent: 35.97 %
Brady Statistic AP VS Percent: 1.09 %
Brady Statistic AS VP Percent: 61.86 %
Brady Statistic RA Percent Paced: 37.06 %
Date Time Interrogation Session: 20170124182618
HIGH POWER IMPEDANCE MEASURED VALUE: 58 Ohm
HighPow Impedance: 45 Ohm
Implantable Lead Implant Date: 20090331
Implantable Lead Implant Date: 20090401
Implantable Lead Location: 753858
Implantable Lead Model: 5076
Lead Channel Impedance Value: 361 Ohm
Lead Channel Impedance Value: 418 Ohm
Lead Channel Impedance Value: 551 Ohm
Lead Channel Impedance Value: 589 Ohm
Lead Channel Pacing Threshold Amplitude: 0.625 V
Lead Channel Pacing Threshold Pulse Width: 0.4 ms
Lead Channel Sensing Intrinsic Amplitude: 1.75 mV
Lead Channel Sensing Intrinsic Amplitude: 22.875 mV
Lead Channel Sensing Intrinsic Amplitude: 22.875 mV
Lead Channel Setting Pacing Amplitude: 2 V
Lead Channel Setting Pacing Amplitude: 2.25 V
Lead Channel Setting Pacing Pulse Width: 0.4 ms
Lead Channel Setting Pacing Pulse Width: 0.4 ms
Lead Channel Setting Sensing Sensitivity: 0.3 mV
MDC IDC LEAD IMPLANT DT: 20090331
MDC IDC LEAD LOCATION: 753859
MDC IDC LEAD LOCATION: 753860
MDC IDC LEAD MODEL: 4194
MDC IDC MSMT BATTERY REMAINING LONGEVITY: 53 mo
MDC IDC MSMT BATTERY VOLTAGE: 2.97 V
MDC IDC MSMT LEADCHNL LV PACING THRESHOLD AMPLITUDE: 1.25 V
MDC IDC MSMT LEADCHNL LV PACING THRESHOLD PULSEWIDTH: 0.4 ms
MDC IDC MSMT LEADCHNL RA IMPEDANCE VALUE: 513 Ohm
MDC IDC MSMT LEADCHNL RA SENSING INTR AMPL: 1.75 mV
MDC IDC MSMT LEADCHNL RV IMPEDANCE VALUE: 418 Ohm
MDC IDC MSMT LEADCHNL RV PACING THRESHOLD AMPLITUDE: 2.25 V
MDC IDC MSMT LEADCHNL RV PACING THRESHOLD PULSEWIDTH: 0.4 ms
MDC IDC SET LEADCHNL RV PACING AMPLITUDE: 4.5 V
MDC IDC STAT BRADY AS VS PERCENT: 1.08 %
MDC IDC STAT BRADY RV PERCENT PACED: 86.39 %

## 2015-04-22 MED ORDER — CARVEDILOL 12.5 MG PO TABS: 12.5000 mg | ORAL_TABLET | Freq: Two times a day (BID) | ORAL | Status: DC

## 2015-04-22 NOTE — Progress Notes (Signed)
Patient ID: [name], female   DOB: 01/11/1929, 82 y.o.   MRN: [id number]    Cardiology Office Note    Date:  04/24/2015   ID:  BAKER YAUCH, DOB 1928/09/18, MRN [id number]  PCP:  [place], BETTY G, MD  Cardiologist:  Jolyn Nap, MD;  Sanda Klein, MD   Chief Complaint  Patient presents with  . Pacemaker Check    History of Present Illness:  [name] is a 82 y.o. female with CHF due to nonischemic cardiomyopathy, CRT hyperresponder here for CHF follow up and CRT-D check.  She describes her right calf as swollen, but not tender or tight. It is indeed clearly larger than the left and is slightly red-dusky and warm.  Her carvedilol was decreased to 6.25 mg BID due to bradycardia concerns (her pacemaker is programmed with lower rate 50). Carvedilol was prescribed for cardiomyopathy.  Her husband (Ray) recently passed away and she was hospitalized for small bowel obstruction. She is still grieving. She has chronic mild dyspnea, no other CV complaints. She has lost 20 lb in the last year/ Temporarily living with her son. Plans to return home.  Reported left ventricular ejection fraction as low as 20% in the past. She had normal coronary arteries by catheterization in 1992. After implementation of resynchronization pacemaker therapy in 2009 her ejection fraction has returned to virtually to normal. 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 She is not pacemaker dependent. She has COPD with chronic dyspnea, hyperlipidemia and a history of neuropathy secondary to chemotherapy.  Normal device check. Generator expected longevity 4.4 years. Only 89.6% BiV pacing (97.9% with VSRP), mostly due to sensed ventricular escape beats (LRL was set at 50 bpm). Optivol briefly abnormal in December, when she was hospitalized with SBO. No VT/VF. One atrial mode switch event (48 seconds only) was probably atrial flutter  Past Medical  History  Diagnosis Date  . AICD (automatic cardioverter/defibrillator) present 06/27/2007    medtronic  . Thyroid disease   . Cancer ([place])     ovarian--stage 4  . Cardiomyopathy ([place])   . CHF (congestive heart failure) ([place])   . Systemic hypertension   . Peripheral neuropathy [facility])     Past Surgical History  Procedure Laterality Date  . Appendectomy    . Cholecystectomy    . Cardiac defibrillator placement  06/27/2007    medtronic concerto  . Abdominal hysterectomy  oct 2000    along with ovarian ca surg  . Cardiac catheterization  09/27/1990    normal coronaries,dilated cardiomyopathy  . Nm myocar perf wall motion  06/05/2007    no ischemia  . Implantable cardioverter defibrillator generator change N/A 07/31/2013    Procedure: IMPLANTABLE CARDIOVERTER DEFIBRILLATOR GENERATOR CHANGE;  Surgeon: Sanda Klein, MD;  Location: [facility] CATH LAB;  Service: Cardiovascular;  Laterality: N/A;    Outpatient Prescriptions Prior to Visit  Medication Sig Dispense Refill  . alendronate (FOSAMAX) 70 MG tablet Take 70 mg by mouth every 7 (seven) days.    . calcium-vitamin D (OSCAL WITH D) 250-125 MG-UNIT per tablet Take 1 tablet by mouth daily.      Marland Kitchen co-enzyme Q-10 50 MG capsule Take 50 mg by mouth daily.      . COMBIGAN 0.2-0.5 % ophthalmic solution Place 1 drop into both eyes 2 (two) times daily.    . digoxin (LANOXIN) 0.25 MG tablet TAKE 1 TABLET DAILY 90 tablet 1  . FLUZONE HIGH-DOSE 0.5 ML  SUSY once.  0  . furosemide (LASIX) 20 MG tablet TAKE ONE-HALF (1/2) TABLET DAILY 45 tablet 6  . latanoprost (XALATAN) 0.005 % ophthalmic solution Place 1 drop into both eyes at bedtime.  6  . losartan (COZAAR) 100 MG tablet TAKE 1 TABLET DAILY 90 tablet 1  . Omega-3 Fatty Acids (FISH OIL PO) Take 1 capsule by mouth daily.    Marland Kitchen oxyCODONE (ROXICODONE) 15 MG immediate release tablet Take 15 mg by mouth 5 (five) times daily as needed for pain.   0  . SYNTHROID 100 MCG tablet Take 100 mcg by mouth daily.    .  vitamin C (ASCORBIC ACID) 500 MG tablet Take 500 mg by mouth daily.     . carvedilol (COREG) 12.5 MG tablet TAKE 1 TABLET DAILY 90 tablet 1  . ondansetron (ZOFRAN-ODT) 4 MG disintegrating tablet Take 4 mg by mouth every 12 (twelve) hours as needed for nausea or vomiting.      No facility-administered medications prior to visit.     Allergies:   Review of patient's allergies indicates no known allergies.   Social History   Social History  . Marital Status: Married    Spouse Name: N/A  . Number of Children: N/A  . Years of Education: N/A   Occupational History  . housewife    Social History Main Topics  . Smoking status: Never Smoker   . Smokeless tobacco: None  . Alcohol Use: No  . Drug Use: No  . Sexual Activity: Not Asked   Other Topics Concern  . None   Social History Narrative     Family History:  The patient's family history includes Heart disease in her paternal grandmother; Lung cancer in her father and mother.   ROS:   Please see the history of present illness.    ROS All other systems reviewed and are negative.   PHYSICAL EXAM:   VS:  BP 138/76 mmHg  Pulse 54  Ht 5\' 2"  (1.575 m)  Wt 127 lb (57.607 kg)  BMI 23.22 kg/m2   GEN: Well nourished, well developed, in no acute distress HEENT: normal Neck: no JVD, carotid bruits, or masses Cardiac: RRR; no murmurs, rubs, or gallops,no edema , healthy device site Respiratory:  clear to auscultation bilaterally, normal work of breathing GI: soft, nontender, nondistended, + BS MS: no deformity or atroph; the right calf is noticeably larger than the left and has a reddish discoloration, slightly warm, negative Homan's Skin: warm and dry, no rash Neuro:  Alert and Oriented x 3, Strength and sensation are intact Psych: melancholy, occasionally tearful, grieving, but able to discuss her loss, exhibit hopefulness and humor  Wt Readings from Last 3 Encounters:  04/22/15 127 lb (57.607 kg)  03/28/15 134 lb (60.782 kg)    04/02/14 147 lb 9.6 oz (66.951 kg)      Studies/Labs Reviewed:   EKG:  EKG is ordered today.  The ekg ordered today demonstrates A paced BiV paced  Recent Labs: 03/28/2015: ALT 15 03/29/2015: BUN 32*; Creatinine, Ser 0.91; Hemoglobin 13.2; Platelets 197; Potassium 3.4*; Sodium 133*   Lipid Panel    Component Value Date/Time   CHOL 164 06/27/2013 1027   TRIG 338* 06/27/2013 1027   HDL 37* 06/27/2013 1027   CHOLHDL 4.4 06/27/2013 1027   VLDL 68* 06/27/2013 1027   LDLCALC 59 06/27/2013 1027   LDLDIRECT 111* 03/06/2013 1054    Additional studies/ records that were reviewed today include:  Records form SBO admission  ASSESSMENT:    1. Chronic combined systolic and diastolic CHF, NYHA class 2 ([place])   2. Non-ischemic cardiomyopathy ([place])   3. Biventricular ICD Medtronic Concerto implanted [id number]   4. COPD with asthma ([place])   5. suspected DVT (deep venous thrombosis), right   6. Paroxysmal atrial flutter ([facility])      PLAN:  In order of problems listed above:  1. CHF: Now with normal LVEF (CRT hyper-responder), appears euvolemic clinically and by Optivol. Chronic COPD related dyspnea. 2. NICMP: Now with normal LVEF (actually, last echo 2009) 3. CRT-D: Normal device function. CRT less effective due to V sensed escape beats, will increase LRL to 60 bpm. 4. COPD is a long standing diagnosis, although she is not a previous smoker, had FEV1 60% of predicted with good response to bronchodilator (may actually be asthma?). 5. Right calf swelling and discoloration are of concern, especially with recent hospitalization. We ordered a lower extremity venous Duplex US that did not show DVT. 6. Episode of atrial flutter was only 48 seconds in duration and occurred 4 months ago. No anticoagulation at this time. Monitor via her device. If longer episodes are recorded she would qualify for full anticoagulation (CHADSVasc 4: age 38, gender 1, CHF 1).    Medication Adjustments/Labs and Tests  Ordered: Current medicines are reviewed at length with the patient today.  Concerns regarding medicines are outlined above.  Medication changes, Labs and Tests ordered today are listed in the Patient Instructions below. Patient Instructions  Your physician has recommended you make the following change in your medication: INCREASE CARVEDILOL TO 12.5 MG TWICE DAILY  Your physician has requested that you have a lower RIGHTextremity venous duplex. This test is an ultrasound of the veins in the legs or arms. It looks at venous blood flow that carries blood from the heart to the legs or arms. Allow one hour for a Lower Venous exam. Allow thirty minutes for an Upper Venous exam. There are no restrictions or special instructions.  Remote monitoring is used to monitor your Pacemaker or ICD from home. This monitoring reduces the number of office visits required to check your device to one time per year. It allows [name] to monitor the functioning of your device to ensure it is working properly. You are scheduled for a device check from home on July 23, 2015. You may send your transmission at any time that day. If you have a wireless device, the transmission will be sent automatically. After your physician reviews your transmission, you will receive a postcard with your next transmission date.  Dr. Sallyanne Kuster recommends that you schedule a follow-up appointment in: [place], Madix Blowe, MD  04/24/2015 10:53 AM    [address], [place], [place]  [address] Phone: [phone]; Fax: [phone]

## 2015-04-22 NOTE — Patient Instructions (Signed)
Your physician has recommended you make the following change in your medication: INCREASE CARVEDILOL TO 12.5 MG TWICE DAILY  Your physician has requested that you have a lower RIGHTextremity venous duplex. This test is an ultrasound of the veins in the legs or arms. It looks at venous blood flow that carries blood from the heart to the legs or arms. Allow one hour for a Lower Venous exam. Allow thirty minutes for an Upper Venous exam. There are no restrictions or special instructions.  Remote monitoring is used to monitor your Pacemaker or ICD from home. This monitoring reduces the number of office visits required to check your device to one time per year. It allows [name] to monitor the functioning of your device to ensure it is working properly. You are scheduled for a device check from home on July 23, 2015. You may send your transmission at any time that day. If you have a wireless device, the transmission will be sent automatically. After your physician reviews your transmission, you will receive a postcard with your next transmission date.  Dr. Sallyanne Kuster recommends that you schedule a follow-up appointment in: 6 MONTHS

## 2015-04-23 DIAGNOSIS — G894 Chronic pain syndrome: Secondary | ICD-10-CM | POA: Diagnosis not present

## 2015-04-23 DIAGNOSIS — G629 Polyneuropathy, unspecified: Secondary | ICD-10-CM | POA: Diagnosis not present

## 2015-04-23 DIAGNOSIS — J45909 Unspecified asthma, uncomplicated: Secondary | ICD-10-CM | POA: Diagnosis not present

## 2015-04-23 DIAGNOSIS — M150 Primary generalized (osteo)arthritis: Secondary | ICD-10-CM | POA: Diagnosis not present

## 2015-04-23 DIAGNOSIS — I509 Heart failure, unspecified: Secondary | ICD-10-CM | POA: Diagnosis not present

## 2015-04-23 DIAGNOSIS — R2689 Other abnormalities of gait and mobility: Secondary | ICD-10-CM | POA: Diagnosis not present

## 2015-04-24 ENCOUNTER — Encounter: Payer: Self-pay | Admitting: Cardiovascular Disease

## 2015-04-24 DIAGNOSIS — I4892 Unspecified atrial flutter: Secondary | ICD-10-CM | POA: Insufficient documentation

## 2015-04-25 DIAGNOSIS — K439 Ventral hernia without obstruction or gangrene: Secondary | ICD-10-CM | POA: Diagnosis not present

## 2015-04-30 DIAGNOSIS — I509 Heart failure, unspecified: Secondary | ICD-10-CM | POA: Diagnosis not present

## 2015-04-30 DIAGNOSIS — J45909 Unspecified asthma, uncomplicated: Secondary | ICD-10-CM | POA: Diagnosis not present

## 2015-04-30 DIAGNOSIS — G629 Polyneuropathy, unspecified: Secondary | ICD-10-CM | POA: Diagnosis not present

## 2015-04-30 DIAGNOSIS — R2689 Other abnormalities of gait and mobility: Secondary | ICD-10-CM | POA: Diagnosis not present

## 2015-04-30 DIAGNOSIS — G894 Chronic pain syndrome: Secondary | ICD-10-CM | POA: Diagnosis not present

## 2015-04-30 DIAGNOSIS — M150 Primary generalized (osteo)arthritis: Secondary | ICD-10-CM | POA: Diagnosis not present

## 2015-05-02 DIAGNOSIS — G894 Chronic pain syndrome: Secondary | ICD-10-CM | POA: Diagnosis not present

## 2015-05-02 DIAGNOSIS — R2689 Other abnormalities of gait and mobility: Secondary | ICD-10-CM | POA: Diagnosis not present

## 2015-05-02 DIAGNOSIS — J45909 Unspecified asthma, uncomplicated: Secondary | ICD-10-CM | POA: Diagnosis not present

## 2015-05-02 DIAGNOSIS — G629 Polyneuropathy, unspecified: Secondary | ICD-10-CM | POA: Diagnosis not present

## 2015-05-02 DIAGNOSIS — I509 Heart failure, unspecified: Secondary | ICD-10-CM | POA: Diagnosis not present

## 2015-05-02 DIAGNOSIS — M150 Primary generalized (osteo)arthritis: Secondary | ICD-10-CM | POA: Diagnosis not present

## 2015-05-16 ENCOUNTER — Encounter (HOSPITAL_COMMUNITY): Payer: Self-pay

## 2015-05-16 ENCOUNTER — Inpatient Hospital Stay (HOSPITAL_COMMUNITY)
Admission: EM | Admit: 2015-05-16 | Discharge: 2015-05-20 | DRG: 389 | Disposition: A | Discharge status: Home / Self Care | Payer: Medicare Other | Attending: Internal Medicine | Admitting: Internal Medicine

## 2015-05-16 ENCOUNTER — Emergency Department (HOSPITAL_COMMUNITY): Payer: Medicare Other

## 2015-05-16 DIAGNOSIS — R1111 Vomiting without nausea: Secondary | ICD-10-CM | POA: Diagnosis not present

## 2015-05-16 DIAGNOSIS — Z9049 Acquired absence of other specified parts of digestive tract: Secondary | ICD-10-CM

## 2015-05-16 DIAGNOSIS — D72829 Elevated white blood cell count, unspecified: Secondary | ICD-10-CM | POA: Diagnosis present

## 2015-05-16 DIAGNOSIS — K565 Intestinal adhesions [bands] with obstruction (postprocedural) (postinfection): Secondary | ICD-10-CM | POA: Diagnosis not present

## 2015-05-16 DIAGNOSIS — I5042 Chronic combined systolic (congestive) and diastolic (congestive) heart failure: Secondary | ICD-10-CM | POA: Diagnosis not present

## 2015-05-16 DIAGNOSIS — Z8543 Personal history of malignant neoplasm of ovary: Secondary | ICD-10-CM

## 2015-05-16 DIAGNOSIS — K5660 Unspecified intestinal obstruction: Secondary | ICD-10-CM | POA: Diagnosis not present

## 2015-05-16 DIAGNOSIS — Z8249 Family history of ischemic heart disease and other diseases of the circulatory system: Secondary | ICD-10-CM

## 2015-05-16 DIAGNOSIS — K2970 Gastritis, unspecified, without bleeding: Secondary | ICD-10-CM | POA: Diagnosis not present

## 2015-05-16 DIAGNOSIS — Z79899 Other long term (current) drug therapy: Secondary | ICD-10-CM

## 2015-05-16 DIAGNOSIS — I10 Essential (primary) hypertension: Secondary | ICD-10-CM | POA: Diagnosis present

## 2015-05-16 DIAGNOSIS — K56609 Unspecified intestinal obstruction, unspecified as to partial versus complete obstruction: Secondary | ICD-10-CM

## 2015-05-16 DIAGNOSIS — K432 Incisional hernia without obstruction or gangrene: Secondary | ICD-10-CM | POA: Diagnosis present

## 2015-05-16 DIAGNOSIS — R112 Nausea with vomiting, unspecified: Secondary | ICD-10-CM | POA: Diagnosis not present

## 2015-05-16 DIAGNOSIS — Z95810 Presence of automatic (implantable) cardiac defibrillator: Secondary | ICD-10-CM

## 2015-05-16 DIAGNOSIS — R61 Generalized hyperhidrosis: Secondary | ICD-10-CM | POA: Diagnosis not present

## 2015-05-16 DIAGNOSIS — E039 Hypothyroidism, unspecified: Secondary | ICD-10-CM | POA: Diagnosis not present

## 2015-05-16 DIAGNOSIS — K5669 Other intestinal obstruction: Secondary | ICD-10-CM | POA: Diagnosis not present

## 2015-05-16 MED ORDER — ONDANSETRON HCL 4 MG/2ML IJ SOLN
4.0000 mg | Freq: Once | INTRAMUSCULAR | Status: AC
  Administered 2015-05-17: 4 mg via INTRAVENOUS
  Filled 2015-05-16: qty 2

## 2015-05-16 NOTE — ED Provider Notes (Signed)
CSN: [id number]     Arrival date & time 05/16/15  2234 History  By signing my name below, I, Randa Evens, attest that this documentation has been prepared under the direction and in the presence of Alessia Gonsalez, MD. Electronically Signed: Randa Evens, ED Scribe. 05/17/2015. 12:12 AM.     Chief Complaint  Patient presents with  . Emesis    Patient is a 82 y.o. female presenting with vomiting. The history is provided by the patient. No language interpreter was used.  Emesis Severity:  Severe Timing:  Constant Quality:  Stomach contents Progression:  Unchanged Chronicity:  Recurrent Recent urination:  Normal Relieved by:  Nothing Worsened by:  Nothing tried Ineffective treatments:  None tried Associated symptoms: no abdominal pain and no diarrhea   Risk factors: no alcohol use    HPI Comments: [name] is a 82 y.o. female brought in by ambulance, who presents to the Emergency Department complaining of vomiting onset tonight. Pt reports associated nausea, constipation  Pt states that her last bowel movement was 2 days prior. Pt states that since being in the ED she has had zofran with no relief. Denies diarrhea, CP, SOB, fever or other related symptoms.   Pt reports HX of similar symptoms in December 2-16.    Past Medical History  Diagnosis Date  . AICD (automatic cardioverter/defibrillator) present 06/27/2007    medtronic  . Thyroid disease   . Cancer ([place])     ovarian--stage 4  . Cardiomyopathy ([place])   . CHF (congestive heart failure) ([place])   . Systemic hypertension   . Peripheral neuropathy [facility])    Past Surgical History  Procedure Laterality Date  . Appendectomy    . Cholecystectomy    . Cardiac defibrillator placement  06/27/2007    medtronic concerto  . Abdominal hysterectomy  oct 2000    along with ovarian ca surg  . Cardiac catheterization  09/27/1990    normal coronaries,dilated cardiomyopathy  . Nm myocar perf wall motion  06/05/2007    no ischemia   . Implantable cardioverter defibrillator generator change N/A 07/31/2013    Procedure: IMPLANTABLE CARDIOVERTER DEFIBRILLATOR GENERATOR CHANGE;  Surgeon: Sanda Klein, MD;  Location: [facility] CATH LAB;  Service: Cardiovascular;  Laterality: N/A;   Family History  Problem Relation Age of Onset  . Heart disease Paternal Grandmother   . Lung cancer Mother     died from cancer  . Lung cancer Father    Social History  Substance Use Topics  . Smoking status: Never Smoker   . Smokeless tobacco: None  . Alcohol Use: No   OB History    No data available     Review of Systems  Constitutional: Negative for fever.  Respiratory: Negative for shortness of breath.   Cardiovascular: Negative for chest pain.  Gastrointestinal: Positive for nausea, vomiting and constipation. Negative for abdominal pain and diarrhea.  All other systems reviewed and are negative.     Allergies  Review of patient's allergies indicates no known allergies.  Home Medications   Prior to Admission medications   Medication Sig Start Date End Date Taking? Authorizing Provider  calcium-vitamin D (OSCAL WITH D) 250-125 MG-UNIT per tablet Take 1 tablet by mouth daily.     Yes Historical Provider, MD  carvedilol (COREG) 12.5 MG tablet Take 1 tablet (12.5 mg total) by mouth 2 (two) times daily with a meal. 04/22/15  Yes Mihai Croitoru, MD  co-enzyme Q-10 50 MG capsule Take 50 mg by mouth daily.  Yes Historical Provider, MD  COMBIGAN 0.2-0.5 % ophthalmic solution Place 1 drop into both eyes 2 (two) times daily. 03/27/15  Yes Historical Provider, MD  digoxin (LANOXIN) 0.25 MG tablet TAKE 1 TABLET DAILY 12/03/14  Yes Mihai Croitoru, MD  furosemide (LASIX) 20 MG tablet TAKE ONE-HALF (1/2) TABLET DAILY 11/13/14  Yes Mihai Croitoru, MD  latanoprost (XALATAN) 0.005 % ophthalmic solution Place 1 drop into both eyes at bedtime. 03/05/14  Yes Historical Provider, MD  losartan (COZAAR) 100 MG tablet TAKE 1 TABLET DAILY 12/03/14  Yes Mihai  Croitoru, MD  Omega-3 Fatty Acids (FISH OIL PO) Take 1 capsule by mouth daily.   Yes Historical Provider, MD  oxyCODONE (ROXICODONE) 15 MG immediate release tablet Take 15 mg by mouth 5 (five) times daily as needed for pain.  02/28/15  Yes Historical Provider, MD  SYNTHROID 100 MCG tablet Take 100 mcg by mouth daily. 03/21/15  Yes Historical Provider, MD  vitamin C (ASCORBIC ACID) 500 MG tablet Take 500 mg by mouth daily.    Yes Historical Provider, MD   BP 202/78 mmHg  Pulse 68  Temp(Src) 97.9 F (36.6 C) (Oral)  Resp 16  SpO2 93%   Physical Exam  Constitutional: She is oriented to person, place, and time. She appears well-developed and well-nourished. No distress.  HENT:  Head: Normocephalic and atraumatic.  Mouth/Throat: Oropharynx is clear and moist.  Eyes: Conjunctivae and EOM are normal. Pupils are equal, round, and reactive to light.  Neck: Normal range of motion. Neck supple. Carotid bruit is not present. No tracheal deviation present.  Cardiovascular: Normal rate, regular rhythm and normal heart sounds.   Pulmonary/Chest: Effort normal. No stridor. No respiratory distress.  Abdominal: Soft. Bowel sounds are increased. There is no tenderness. There is no rebound and no guarding.  Large defects and freely reducible hernia.   Musculoskeletal: Normal range of motion.  Neurological: She is alert and oriented to person, place, and time.  Skin: Skin is warm and dry.  Psychiatric: She has a normal mood and affect. Her behavior is normal.  Nursing note and vitals reviewed.   ED Course  Procedures (including critical care time) DIAGNOSTIC STUDIES: Oxygen Saturation is 93% on RA, adequate by my interpretation.    COORDINATION OF CARE: 12:12 AM-Discussed treatment plan with pt at bedside and pt agreed to plan.  2:16 AM- case discussed with general surgery for angio tube placement.    Labs Review Labs Reviewed  CBC WITH DIFFERENTIAL/PLATELET  HEPATIC FUNCTION PANEL  LIPASE,  BLOOD  URINALYSIS, ROUTINE W REFLEX MICROSCOPIC (NOT AT [facility])  DIGOXIN LEVEL  I-STAT CHEM 8, ED  I-STAT TROPOININ, ED    Imaging Review No results found.    EKG Interpretation None      MDM   Final diagnoses:  None    Results for orders placed or performed during the hospital encounter of 05/16/15  CBC with Differential/Platelet  Result Value Ref Range   WBC 15.7 (H) 4.0 - 10.5 K/uL   RBC 4.82 3.87 - 5.11 MIL/uL   Hemoglobin 14.7 12.0 - 15.0 g/dL   HCT 43.0 36.0 - 46.0 %   MCV 89.2 78.0 - 100.0 fL   MCH 30.5 26.0 - 34.0 pg   MCHC 34.2 30.0 - 36.0 g/dL   RDW 12.8 11.5 - 15.5 %   Platelets 194 150 - 400 K/uL   Neutrophils Relative % 85 %   Neutro Abs 13.3 (H) 1.7 - 7.7 K/uL   Lymphocytes Relative 6 %  Lymphs Abs 1.0 0.7 - 4.0 K/uL   Monocytes Relative 7 %   Monocytes Absolute 1.1 (H) 0.1 - 1.0 K/uL   Eosinophils Relative 2 %   Eosinophils Absolute 0.3 0.0 - 0.7 K/uL   Basophils Relative 0 %   Basophils Absolute 0.1 0.0 - 0.1 K/uL  Hepatic function panel  Result Value Ref Range   Total Protein 7.5 6.5 - 8.1 g/dL   Albumin 3.9 3.5 - 5.0 g/dL   AST 17 15 - 41 U/L   ALT 12 (L) 14 - 54 U/L   Alkaline Phosphatase 67 38 - 126 U/L   Total Bilirubin 0.7 0.3 - 1.2 mg/dL   Bilirubin, Direct 0.2 0.1 - 0.5 mg/dL   Indirect Bilirubin 0.5 0.3 - 0.9 mg/dL  Lipase, blood  Result Value Ref Range   Lipase 21 11 - 51 U/L  Digoxin level  Result Value Ref Range   Digoxin Level 1.3 0.8 - 2.0 ng/mL  I-stat chem 8, ed  Result Value Ref Range   Sodium 133 (L) 135 - 145 mmol/L   Potassium 4.6 3.5 - 5.1 mmol/L   Chloride 98 (L) 101 - 111 mmol/L   BUN 24 (H) 6 - 20 mg/dL   Creatinine, Ser 1.00 0.44 - 1.00 mg/dL   Glucose, Bld 144 (H) 65 - 99 mg/dL   Calcium, Ion 1.09 (L) 1.13 - 1.30 mmol/L   TCO2 26 0 - 100 mmol/L   Hemoglobin 15.6 (H) 12.0 - 15.0 g/dL   HCT 46.0 36.0 - 46.0 %  I-stat troponin, ED  Result Value Ref Range   Troponin i, poc 0.00 0.00 - 0.08 ng/mL   Comment  3           Dg Chest 2 View  05/17/2015  CLINICAL DATA:  82 year old female with nausea and diaphoresis. History of ovarian cancer. EXAM: CHEST  2 VIEW COMPARISON:  Radiograph dated 03/28/2015 FINDINGS: Two views of the chest do not demonstrate a focal consolidation. There is no pleural effusion or pneumothorax. There is mild eventration of the left hemidiaphragm. The cardiac silhouette is within normal limits. Left pectoral AICD device noted. There is osteopenia with degenerative changes of the spine. No acute fracture. IMPRESSION: No active cardiopulmonary disease. Electronically Signed   By: Anner Crete M.D.   On: 05/17/2015 00:11   Ct Abdomen Pelvis W Contrast  05/17/2015  CLINICAL DATA:  82 year old female with abdominal pain, nausea vomiting. EXAM: CT ABDOMEN AND PELVIS WITH CONTRAST TECHNIQUE: Multidetector CT imaging of the abdomen and pelvis was performed using the standard protocol following bolus administration of intravenous contrast. CONTRAST:  76mL OMNIPAQUE IOHEXOL 300 MG/ML SOLN, 153mL OMNIPAQUE IOHEXOL 300 MG/ML SOLN COMPARISON:  CT dated 03/28/2015 FINDINGS: The visualized lung bases are clear. Mild cardiomegaly. Cardiac pacemaker wires noted. No intra-abdominal free air. Small free fluid within the pelvis. Cholecystectomy. Mild biliary ductal dilatation, likely post cholecystectomy. The liver appears unremarkable. The pancreas is atrophic. The spleen appears unremarkable. There is left adrenal thickening/ hyperplasia. There is an indeterminate stable 1.5 cm right adrenal nodule. There is moderate left renal atrophy. There is no hydronephrosis on the left. There is an extrarenal pelvis on the left with mild fullness of the renal pelvis similar to prior study. The visualized ureters and urinary bladder appear unremarkable. Hysterectomy. There is postsurgical changes of bowel resection with multiple anastomotic sutures. Multiple mildly dilated loops of proximal and mid small bowel noted.  The distal small bowel demonstrate a normal caliber. A transition zone is  noted in the left anterior lower abdomen (series 2 image 37) where there is abutment of bowel to the anterior peritoneal wall and adhesion. There is mild thickening of the bowel at this site. There is a broad-based ventral hernia along the midline vertical anterior pelvic wall incisional scar. There is abutment of loops of small bowel to the anterior peritoneal wall compatible with adhesions. A left periumbilical hernia defect containing a short segment of small bowel noted. There is no evidence of bowel obstruction at this site. There is aortoiliac atherosclerotic disease. The abdominal aorta is tortuous and ectatic. No portal venous gas identified. There is no adenopathy. There is osteopenia with scoliosis and extensive degenerative changes of the spine. 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 No acute fracture. IMPRESSION: Small-bowel obstruction with transition zone in the left anterior lower abdomen secondary to peritoneal adhesions. Other nonacute findings as seen on the prior exam and described above. Electronically Signed   By: Anner Crete M.D.   On: 05/17/2015 02:02    Medications  ondansetron (ZOFRAN) injection 4 mg (4 mg Intravenous Given 05/17/15 0011)  iohexol (OMNIPAQUE) 300 MG/ML solution 50 mL (50 mLs Oral Contrast Given 05/17/15 0039)  metoCLOPramide (REGLAN) injection 10 mg (10 mg Intravenous Given 05/17/15 0046)  LORazepam (ATIVAN) injection 0.5 mg (0.5 mg Intravenous Given 05/17/15 0117)  iohexol (OMNIPAQUE) 300 MG/ML solution 100 mL (100 mLs Intravenous Contrast Given 05/17/15 0138)    Case d/w Dr. Zella Richer, admit to medicine.  NGT.  Surgery will consult  I personally performed the services described in this documentation, which was scribed in my presence. The recorded information has been reviewed and is accurate.       Veatrice Kells, MD 05/17/15 [phone]

## 2015-05-16 NOTE — ED Notes (Signed)
Bed: [id number] Expected date:  Expected time:  Means of arrival:  Comments: EMS 82yo F abd pain / N/V

## 2015-05-16 NOTE — ED Notes (Signed)
Delayed in blood work due to pt transported to [facility], Therapist, sports will try to draw from line

## 2015-05-16 NOTE — ED Notes (Signed)
BIB EMS from home c/o nausea, diaphoresis and vomiting. Vomited once with EMS. Brown in [employer] (daughter in law states that this is consistent with what she ate). 8mg  Zofran given en route. Family states that a virus has been going around the house.

## 2015-05-17 ENCOUNTER — Inpatient Hospital Stay (HOSPITAL_COMMUNITY): Payer: Medicare Other

## 2015-05-17 ENCOUNTER — Emergency Department (HOSPITAL_COMMUNITY): Payer: Medicare Other

## 2015-05-17 ENCOUNTER — Encounter (HOSPITAL_COMMUNITY): Payer: Self-pay | Admitting: Emergency Medicine

## 2015-05-17 DIAGNOSIS — I10 Essential (primary) hypertension: Secondary | ICD-10-CM | POA: Diagnosis not present

## 2015-05-17 DIAGNOSIS — K565 Intestinal adhesions [bands] with obstruction (postprocedural) (postinfection): Secondary | ICD-10-CM | POA: Diagnosis present

## 2015-05-17 DIAGNOSIS — E038 Other specified hypothyroidism: Secondary | ICD-10-CM | POA: Diagnosis not present

## 2015-05-17 DIAGNOSIS — R112 Nausea with vomiting, unspecified: Secondary | ICD-10-CM | POA: Diagnosis not present

## 2015-05-17 DIAGNOSIS — Z95810 Presence of automatic (implantable) cardiac defibrillator: Secondary | ICD-10-CM | POA: Diagnosis not present

## 2015-05-17 DIAGNOSIS — K5660 Unspecified intestinal obstruction: Secondary | ICD-10-CM | POA: Diagnosis not present

## 2015-05-17 DIAGNOSIS — D72829 Elevated white blood cell count, unspecified: Secondary | ICD-10-CM | POA: Diagnosis present

## 2015-05-17 DIAGNOSIS — R61 Generalized hyperhidrosis: Secondary | ICD-10-CM | POA: Diagnosis not present

## 2015-05-17 DIAGNOSIS — E039 Hypothyroidism, unspecified: Secondary | ICD-10-CM | POA: Diagnosis present

## 2015-05-17 DIAGNOSIS — Z8543 Personal history of malignant neoplasm of ovary: Secondary | ICD-10-CM | POA: Diagnosis not present

## 2015-05-17 DIAGNOSIS — I5042 Chronic combined systolic (congestive) and diastolic (congestive) heart failure: Secondary | ICD-10-CM | POA: Diagnosis present

## 2015-05-17 DIAGNOSIS — Z4682 Encounter for fitting and adjustment of non-vascular catheter: Secondary | ICD-10-CM | POA: Diagnosis not present

## 2015-05-17 DIAGNOSIS — R109 Unspecified abdominal pain: Secondary | ICD-10-CM | POA: Diagnosis not present

## 2015-05-17 DIAGNOSIS — Z79899 Other long term (current) drug therapy: Secondary | ICD-10-CM | POA: Diagnosis not present

## 2015-05-17 DIAGNOSIS — K56609 Unspecified intestinal obstruction, unspecified as to partial versus complete obstruction: Secondary | ICD-10-CM | POA: Diagnosis present

## 2015-05-17 DIAGNOSIS — K432 Incisional hernia without obstruction or gangrene: Secondary | ICD-10-CM | POA: Diagnosis present

## 2015-05-17 DIAGNOSIS — K5669 Other intestinal obstruction: Secondary | ICD-10-CM | POA: Diagnosis not present

## 2015-05-17 DIAGNOSIS — Z8249 Family history of ischemic heart disease and other diseases of the circulatory system: Secondary | ICD-10-CM | POA: Diagnosis not present

## 2015-05-17 DIAGNOSIS — Z9049 Acquired absence of other specified parts of digestive tract: Secondary | ICD-10-CM | POA: Diagnosis not present

## 2015-05-17 LAB — HEPATIC FUNCTION PANEL
ALBUMIN: 3.9 g/dL (ref 3.5–5.0)
ALT: 12 U/L — ABNORMAL LOW (ref 14–54)
AST: 17 U/L (ref 15–41)
Alkaline Phosphatase: 67 U/L (ref 38–126)
BILIRUBIN INDIRECT: 0.5 mg/dL (ref 0.3–0.9)
Bilirubin, Direct: 0.2 mg/dL (ref 0.1–0.5)
TOTAL PROTEIN: 7.5 g/dL (ref 6.5–8.1)
Total Bilirubin: 0.7 mg/dL (ref 0.3–1.2)

## 2015-05-17 LAB — CBC WITH DIFFERENTIAL/PLATELET
BASOS ABS: 0.1 10*3/uL (ref 0.0–0.1)
BASOS PCT: 0 %
Eosinophils Absolute: 0.3 10*3/uL (ref 0.0–0.7)
Eosinophils Relative: 2 %
HCT: 43 % (ref 36.0–46.0)
HEMOGLOBIN: 14.7 g/dL (ref 12.0–15.0)
LYMPHS PCT: 6 %
Lymphs Abs: 1 10*3/uL (ref 0.7–4.0)
MCH: 30.5 pg (ref 26.0–34.0)
MCHC: 34.2 g/dL (ref 30.0–36.0)
MCV: 89.2 fL (ref 78.0–100.0)
MONOS PCT: 7 %
Monocytes Absolute: 1.1 10*3/uL — ABNORMAL HIGH (ref 0.1–1.0)
NEUTROS ABS: 13.3 10*3/uL — AB (ref 1.7–7.7)
NEUTROS PCT: 85 %
Platelets: 194 10*3/uL (ref 150–400)
RBC: 4.82 MIL/uL (ref 3.87–5.11)
RDW: 12.8 % (ref 11.5–15.5)
WBC: 15.7 10*3/uL — ABNORMAL HIGH (ref 4.0–10.5)

## 2015-05-17 LAB — URINE MICROSCOPIC-ADD ON: BACTERIA UA: NONE SEEN

## 2015-05-17 LAB — I-STAT CHEM 8, ED
BUN: 24 mg/dL — AB (ref 6–20)
Calcium, Ion: 1.09 mmol/L — ABNORMAL LOW (ref 1.13–1.30)
Chloride: 98 mmol/L — ABNORMAL LOW (ref 101–111)
Creatinine, Ser: 1 mg/dL (ref 0.44–1.00)
Glucose, Bld: 144 mg/dL — ABNORMAL HIGH (ref 65–99)
HEMATOCRIT: 46 % (ref 36.0–46.0)
HEMOGLOBIN: 15.6 g/dL — AB (ref 12.0–15.0)
POTASSIUM: 4.6 mmol/L (ref 3.5–5.1)
SODIUM: 133 mmol/L — AB (ref 135–145)
TCO2: 26 mmol/L (ref 0–100)

## 2015-05-17 LAB — LIPASE, BLOOD: LIPASE: 21 U/L (ref 11–51)

## 2015-05-17 LAB — URINALYSIS, ROUTINE W REFLEX MICROSCOPIC
Glucose, UA: NEGATIVE mg/dL
Hgb urine dipstick: NEGATIVE
KETONES UR: NEGATIVE mg/dL
LEUKOCYTES UA: NEGATIVE
NITRITE: NEGATIVE
PROTEIN: 30 mg/dL — AB
Specific Gravity, Urine: >1.046 — ABNORMAL HIGH (ref 1.005–1.030)
pH: 5.5 (ref 5.0–8.0)

## 2015-05-17 LAB — I-STAT TROPONIN, ED: Troponin i, poc: 0 ng/mL (ref 0.00–0.08)

## 2015-05-17 LAB — DIGOXIN LEVEL: DIGOXIN LVL: 1.3 ng/mL (ref 0.8–2.0)

## 2015-05-17 MED ORDER — ENOXAPARIN SODIUM 40 MG/0.4ML ~~LOC~~ SOLN
40.0000 mg | SUBCUTANEOUS | Status: DC
  Administered 2015-05-18 – 2015-05-20 (×3): 40 mg via SUBCUTANEOUS
  Filled 2015-05-17 (×4): qty 0.4

## 2015-05-17 MED ORDER — SODIUM CHLORIDE 0.9 % IV SOLN
1.0000 g | Freq: Once | INTRAVENOUS | Status: AC
  Administered 2015-05-17: 1 g via INTRAVENOUS
  Filled 2015-05-17: qty 10

## 2015-05-17 MED ORDER — CARVEDILOL 12.5 MG PO TABS
12.5000 mg | ORAL_TABLET | Freq: Two times a day (BID) | ORAL | Status: DC
  Filled 2015-05-17 (×3): qty 1

## 2015-05-17 MED ORDER — LEVOTHYROXINE SODIUM 100 MCG IV SOLR
50.0000 ug | Freq: Every day | INTRAVENOUS | Status: DC
  Administered 2015-05-17 – 2015-05-19 (×3): 50 ug via INTRAVENOUS
  Filled 2015-05-17 (×3): qty 5

## 2015-05-17 MED ORDER — ACETAMINOPHEN 325 MG PO TABS
650.0000 mg | ORAL_TABLET | Freq: Four times a day (QID) | ORAL | Status: DC | PRN
  Administered 2015-05-19: 650 mg via ORAL
  Filled 2015-05-17: qty 2

## 2015-05-17 MED ORDER — LORAZEPAM 2 MG/ML IJ SOLN
0.5000 mg | Freq: Once | INTRAMUSCULAR | Status: AC
  Administered 2015-05-17: 0.5 mg via INTRAVENOUS
  Filled 2015-05-17: qty 1

## 2015-05-17 MED ORDER — SODIUM CHLORIDE 0.9% FLUSH
3.0000 mL | Freq: Two times a day (BID) | INTRAVENOUS | Status: DC
  Administered 2015-05-17 – 2015-05-20 (×4): 3 mL via INTRAVENOUS

## 2015-05-17 MED ORDER — METOPROLOL TARTRATE 1 MG/ML IV SOLN
10.0000 mg | Freq: Once | INTRAVENOUS | Status: AC
  Administered 2015-05-17: 10 mg via INTRAVENOUS
  Filled 2015-05-17: qty 10

## 2015-05-17 MED ORDER — SODIUM CHLORIDE 0.9 % IV SOLN
INTRAVENOUS | Status: DC
  Administered 2015-05-17 – 2015-05-19 (×5): via INTRAVENOUS

## 2015-05-17 MED ORDER — ACETAMINOPHEN 650 MG RE SUPP: 650.0000 mg | Freq: Four times a day (QID) | RECTAL | Status: DC | PRN

## 2015-05-17 MED ORDER — LOSARTAN POTASSIUM 50 MG PO TABS
100.0000 mg | ORAL_TABLET | Freq: Every day | ORAL | Status: DC
  Filled 2015-05-17: qty 2

## 2015-05-17 MED ORDER — BRIMONIDINE TARTRATE-TIMOLOL 0.2-0.5 % OP SOLN: 1.0000 [drp] | Freq: Two times a day (BID) | OPHTHALMIC | Status: DC

## 2015-05-17 MED ORDER — IOHEXOL 300 MG/ML  SOLN
100.0000 mL | Freq: Once | INTRAMUSCULAR | Status: AC | PRN
  Administered 2015-05-17: 100 mL via INTRAVENOUS

## 2015-05-17 MED ORDER — BRIMONIDINE TARTRATE 0.2 % OP SOLN
1.0000 [drp] | Freq: Two times a day (BID) | OPHTHALMIC | Status: DC
  Administered 2015-05-17 – 2015-05-20 (×6): 1 [drp] via OPHTHALMIC
  Filled 2015-05-17: qty 5

## 2015-05-17 MED ORDER — METOPROLOL TARTRATE 1 MG/ML IV SOLN: 5.0000 mg | INTRAVENOUS | Status: DC | PRN

## 2015-05-17 MED ORDER — TIMOLOL MALEATE 0.5 % OP SOLN
1.0000 [drp] | Freq: Two times a day (BID) | OPHTHALMIC | Status: DC
  Administered 2015-05-17 – 2015-05-20 (×6): 1 [drp] via OPHTHALMIC
  Filled 2015-05-17: qty 5

## 2015-05-17 MED ORDER — LEVOTHYROXINE SODIUM 100 MCG PO TABS
100.0000 ug | ORAL_TABLET | Freq: Every day | ORAL | Status: DC
  Filled 2015-05-17 (×2): qty 1

## 2015-05-17 MED ORDER — DIGOXIN 125 MCG PO TABS
250.0000 ug | ORAL_TABLET | Freq: Every day | ORAL | Status: DC
  Filled 2015-05-17: qty 1

## 2015-05-17 MED ORDER — METOCLOPRAMIDE HCL 5 MG/ML IJ SOLN
10.0000 mg | Freq: Once | INTRAMUSCULAR | Status: AC
  Administered 2015-05-17: 10 mg via INTRAVENOUS
  Filled 2015-05-17: qty 2

## 2015-05-17 MED ORDER — SODIUM CHLORIDE 0.9 % IV SOLN: INTRAVENOUS | Status: DC

## 2015-05-17 MED ORDER — MORPHINE SULFATE (PF) 2 MG/ML IV SOLN
1.0000 mg | INTRAVENOUS | Status: DC | PRN
  Administered 2015-05-17: 1 mg via INTRAVENOUS
  Administered 2015-05-17 – 2015-05-20 (×11): 2 mg via INTRAVENOUS
  Filled 2015-05-17 (×12): qty 1

## 2015-05-17 MED ORDER — IOHEXOL 300 MG/ML  SOLN
50.0000 mL | Freq: Once | INTRAMUSCULAR | Status: AC | PRN
Start: 2015-05-17 — End: 2015-05-17
  Administered 2015-05-17: 50 mL via ORAL

## 2015-05-17 MED ORDER — LATANOPROST 0.005 % OP SOLN
1.0000 [drp] | Freq: Every day | OPHTHALMIC | Status: DC
  Administered 2015-05-17 – 2015-05-19 (×3): 1 [drp] via OPHTHALMIC
  Filled 2015-05-17: qty 2.5

## 2015-05-17 MED ORDER — ONDANSETRON HCL 4 MG PO TABS: 4.0000 mg | ORAL_TABLET | Freq: Four times a day (QID) | ORAL | Status: DC | PRN

## 2015-05-17 MED ORDER — MENTHOL 3 MG MT LOZG: 1.0000 | LOZENGE | OROMUCOSAL | Status: DC | PRN

## 2015-05-17 MED ORDER — PHENOL 1.4 % MT LIQD
1.0000 | OROMUCOSAL | Status: DC | PRN
Start: 2015-05-17 — End: 2015-05-20
  Administered 2015-05-18: 1 via OROMUCOSAL
  Filled 2015-05-17: qty 177

## 2015-05-17 MED ORDER — LEVALBUTEROL HCL 0.63 MG/3ML IN NEBU: 0.6300 mg | INHALATION_SOLUTION | Freq: Four times a day (QID) | RESPIRATORY_TRACT | Status: DC | PRN

## 2015-05-17 MED ORDER — ONDANSETRON HCL 4 MG/2ML IJ SOLN: 4.0000 mg | Freq: Four times a day (QID) | INTRAMUSCULAR | Status: DC | PRN

## 2015-05-17 MED ORDER — HYDRALAZINE HCL 20 MG/ML IJ SOLN
10.0000 mg | INTRAMUSCULAR | Status: DC | PRN
  Administered 2015-05-18 – 2015-05-19 (×2): 10 mg via INTRAVENOUS
  Filled 2015-05-17 (×2): qty 1

## 2015-05-17 NOTE — Progress Notes (Signed)
[facility] PROGRESS NOTE  FAHREN PEREL [id number] DOB: February 12, 1929 DOA: 05/16/2015 PCP: Martinique, BETTY G, MD  Assessment/Plan: 1. Small bowel obstruction.  -[name] is a pleasant 82 year old female having a history of undergoing multiple abdominal surgeries including bowel resections as well as adhesional small bowel obstructions. -She presents with complaints of nausea and vomiting as CT scan of abdomen and pelvis revealed small bowel obstruction -She was seen and evaluated by general surgery. -She was made nothing by mouth with placement of NG tube. IV fluids running. -Evaluation she reports improvement to abdominal pain. Passing flatus -Continue cervical management, follow-up on repeat films in a.m.  2.  Hypothyroidism. -Patient having a history of hypothyroidism had been on Synthroid 100 g by mouth daily -NG tube placed as she is nothing by mouth for small bowel obstruction. Will continue Synthroid IV at 50 g daily.  3.  History of hypertension. -As mentioned above patient was nothing by mouth with NG tube placed for small bowel obstruction -We will discontinue oral antihypertensive agents and provide IV Lopressor 5 mg every 2 hours as needed for systolic blood pressures greater than 165.  4.  Leukocytosis. -Like secondary to small bowel obstruction  5.  History of diastolic congestive heart failure -Last transthoracic echocardiogram performed in 2009 showing ejection fraction of 123456 with diastolic dysfunction. -Diuretics held since she is currently nothing by mouth. Providing gentle IV fluid hydration with normal saline running at 75 mL/per hour -Will watch while status carefully  Code Status: Full Code Family Communication:  Disposition Plan: Continue nothing by mouth status, NG tube placement, IV fluids   Consultants:  Gen. surgery  Procedures:  NG tube placement  HPI/Subjective: [name] is a pleasant 82 year old female with a past medical history  of hypertension, history of small bowel obstruction admitted to medical service on 05/17/2015 she presented with complaints of nausea and vomiting. She has a history of undergoing multiple abdominal surgeries including bowel resections. She was further worked up with a CT scan of abdomen and pelvis that revealed the presence of a small bowel obstruction with transition zone in the left anterior lower abdominal region. Small bowel obstruction likely secondary to peritoneal adhesions. Gen. surgery was consulted. NG tube was placed.  Objective: Filed Vitals:   05/17/15 1130 05/17/15 1208  BP: 163/68 150/56  Pulse: 61 60  Temp:    Resp: 17 16    Intake/Output Summary (Last 24 hours) at 05/17/15 1327 Last data filed at 05/17/15 1133  Gross per 24 hour  Intake      0 ml  Output    400 ml  Net   -400 ml   Filed Weights   05/17/15 1208  Weight: 58.968 kg (130 lb)    Exam:   General:  Awake and alert, nontoxic-appearing, reports passing flatus  Cardiovascular: Regular rate and rhythm normal S1-S2, no extremity edema  Respiratory: Normal respiratory effort  Abdomen: Distended although has positive bowel sounds, mild generalized tenderness to palpation  Musculoskeletal: No edema  Data Reviewed: Basic Metabolic Panel:  Recent Labs Lab 05/17/15 0020  NA 133*  K 4.6  CL 98*  GLUCOSE 144*  BUN 24*  CREATININE 1.00   Liver Function Tests:  Recent Labs Lab 05/17/15 0008  AST 17  ALT 12*  ALKPHOS 67  BILITOT 0.7  PROT 7.5  ALBUMIN 3.9    Recent Labs Lab 05/17/15 0008  LIPASE 21   No results for input(s): AMMONIA in the last 168 hours. CBC:  Recent  Labs Lab 05/17/15 0008 05/17/15 0020  WBC 15.7*  --   NEUTROABS 13.3*  --   HGB 14.7 15.6*  HCT 43.0 46.0  MCV 89.2  --   PLT 194  --    Cardiac Enzymes: No results for input(s): CKTOTAL, CKMB, CKMBINDEX, TROPONINI in the last 168 hours. BNP (last 3 results) No results for input(s): BNP in the last 8760  hours.  ProBNP (last 3 results) No results for input(s): PROBNP in the last 8760 hours.  CBG: No results for input(s): GLUCAP in the last 168 hours.  No results found for this or any previous visit (from the past 240 hour(s)).   Studies: Dg Chest 2 View  05/17/2015  CLINICAL DATA:  82 year old female with nausea and diaphoresis. History of ovarian cancer. EXAM: CHEST  2 VIEW COMPARISON:  Radiograph dated 03/28/2015 FINDINGS: Two views of the chest do not demonstrate a focal consolidation. There is no pleural effusion or pneumothorax. There is mild eventration of the left hemidiaphragm. The cardiac silhouette is within normal limits. Left pectoral AICD device noted. There is osteopenia with degenerative changes of the spine. No acute fracture. IMPRESSION: No active cardiopulmonary disease. Electronically Signed   By: Anner Crete M.D.   On: 05/17/2015 00:11   Dg Abd 1 View  05/17/2015  CLINICAL DATA:  NGT placement EXAM: ABDOMEN - 1 VIEW COMPARISON:  03/29/2015 FINDINGS: Enteric tube with tip in the right mid abdomen consistent with location in the distal stomach. Scattered gas and stool throughout the colon. No small or large bowel distention. Residual contrast material is demonstrated in the bladder and in the right renal collecting system. There is dilatation of the right renal collecting system suggesting UPJ obstruction. Degenerative changes in the spine. IMPRESSION: Enteric tube tip is in the right mid abdomen consistent with location in the distal stomach. Residual contrast material within dilated right renal collecting system suggesting UPJ obstruction. Electronically Signed   By: Lucienne Capers M.D.   On: 05/17/2015 03:22   Ct Abdomen Pelvis W Contrast  05/17/2015  CLINICAL DATA:  82 year old female with abdominal pain, nausea vomiting. EXAM: CT ABDOMEN AND PELVIS WITH CONTRAST TECHNIQUE: Multidetector CT imaging of the abdomen and pelvis was performed using the standard protocol  following bolus administration of intravenous contrast. CONTRAST:  53mL OMNIPAQUE IOHEXOL 300 MG/ML SOLN, 183mL OMNIPAQUE IOHEXOL 300 MG/ML SOLN COMPARISON:  CT dated 03/28/2015 FINDINGS: The visualized lung bases are clear. Mild cardiomegaly. Cardiac pacemaker wires noted. No intra-abdominal free air. Small free fluid within the pelvis. Cholecystectomy. Mild biliary ductal dilatation, likely post cholecystectomy. The liver appears unremarkable. The pancreas is atrophic. The spleen appears unremarkable. There is left adrenal thickening/ hyperplasia. There is an indeterminate stable 1.5 cm right adrenal nodule. There is moderate left renal atrophy. There is no hydronephrosis on the left. There is an extrarenal pelvis on the left with mild fullness of the renal pelvis similar to prior study. The visualized ureters and urinary bladder appear unremarkable. Hysterectomy. There is postsurgical changes of bowel resection with multiple anastomotic sutures. Multiple mildly dilated loops of proximal and mid small bowel noted. The distal small bowel demonstrate a normal caliber. A transition zone is noted in the left anterior lower abdomen (series 2 image 37) where there is abutment of bowel to the anterior peritoneal wall and adhesion. There is mild thickening of the bowel at this site. There is a broad-based ventral hernia along the midline vertical anterior pelvic wall incisional scar. There is abutment of loops of small  bowel to the anterior peritoneal wall compatible with adhesions. A left periumbilical hernia defect containing a short segment of small bowel noted. There is no evidence of bowel obstruction at this site. There is aortoiliac atherosclerotic disease. The abdominal aorta is tortuous and ectatic. No portal venous gas identified. There is no adenopathy. There is osteopenia with scoliosis and extensive degenerative changes of the spine. 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 No acute fracture.  IMPRESSION: Small-bowel obstruction with transition zone in the left anterior lower abdomen secondary to peritoneal adhesions. Other nonacute findings as seen on the prior exam and described above. Electronically Signed   By: Anner Crete M.D.   On: 05/17/2015 02:02    Scheduled Meds: . brimonidine  1 drop Both Eyes BID  . enoxaparin (LOVENOX) injection  40 mg Subcutaneous Q24H  . latanoprost  1 drop Both Eyes QHS  . levothyroxine  50 mcg Intravenous Daily  . losartan  100 mg Oral Daily  . sodium chloride flush  3 mL Intravenous Q12H  . timolol  1 drop Both Eyes BID   Continuous Infusions: . sodium chloride 75 mL/hr at 05/17/15 0535    Principal Problem:   Small bowel obstruction [facility]) Active Problems:   Biventricular ICD Medtronic Concerto implanted 123XX123   Hypothyroidism   Essential hypertension   Hypocalcemia   Leukocytosis    Time spent:     Kelvin Cellar  [facility] Pager [phone]. If 7PM-7AM, please contact night-coverage at [email or web address], password [facility] 05/17/2015, 1:27 PM  LOS: 0 days

## 2015-05-17 NOTE — ED Notes (Signed)
Daughter in [place] left contact info: Home: [phone] Cell: [phone]

## 2015-05-17 NOTE — Progress Notes (Signed)
Utilization review completed.  

## 2015-05-17 NOTE — ED Notes (Signed)
EKG given to EDP,Palumbo,MD., for review. 

## 2015-05-17 NOTE — ED Notes (Signed)
Paged Dr. Lubertha South regarding pt's scheduled PO meds and possibility to give IV doses instead due to PT's Dx and present NG tube. Dr Lubertha South gave verbal order for IV metoprolol only and stated he will look at other PO order upon consultation. PO meds will be held until further orders.

## 2015-05-17 NOTE — ED Notes (Signed)
Patient transported to CT 

## 2015-05-17 NOTE — Consult Note (Signed)
Reason for Consult:SBO Referring Physician: Dr. [name] is an 82 y.o. female.  HPI: [name] with a 1 day history of abdominal pain and distention along with nausea and vomiting. She's had multiple prior abdominal surgeries including bowel resections. She has a history of adhesive small bowel obstructions and was recently discharged approximately 5 weeks ago for this. She is passing some gas. Last bowel movement was 2 days ago. CT scan is consistent with a small bowel obstruction with a transition zone in the left lower abdomen where some bowel appears to be adherent to the anterior abdominal wall. No evidence of ischemia. Her daughter is in the room with her. She is afraid of having surgery because of her age and other health problems.  Past Medical History  Diagnosis Date  . AICD (automatic cardioverter/defibrillator) present 06/27/2007    medtronic  . Thyroid disease   . Cancer ([place])     ovarian--stage 4  . Cardiomyopathy ([place])   . CHF (congestive heart failure) ([place])   . Systemic hypertension   . Peripheral neuropathy [facility])     Past Surgical History  Procedure Laterality Date  . Appendectomy    . Cholecystectomy    . Cardiac defibrillator placement  06/27/2007    medtronic concerto  . Abdominal hysterectomy  oct 2000    along with ovarian ca surg  . Cardiac catheterization  09/27/1990    normal coronaries,dilated cardiomyopathy  . Nm myocar perf wall motion  06/05/2007    no ischemia  . Implantable cardioverter defibrillator generator change N/A 07/31/2013    Procedure: IMPLANTABLE CARDIOVERTER DEFIBRILLATOR GENERATOR CHANGE;  Surgeon: Sanda Klein, MD;  Location: [facility] CATH LAB;  Service: Cardiovascular;  Laterality: N/A;    Family History  Problem Relation Age of Onset  . Heart disease Paternal Grandmother   . Lung cancer Mother     died from cancer  . Lung cancer Father     Social History:  reports that she has never smoked. She does not have any smokeless  tobacco history on file. She reports that she does not drink alcohol or use illicit drugs.  Allergies: No Known Allergies  Prior to Admission medications   Medication Sig Start Date End Date Taking? Authorizing Provider  calcium-vitamin D (OSCAL WITH D) 250-125 MG-UNIT per tablet Take 1 tablet by mouth daily.     Yes Historical Provider, MD  carvedilol (COREG) 12.5 MG tablet Take 1 tablet (12.5 mg total) by mouth 2 (two) times daily with a meal. 04/22/15  Yes Mihai Croitoru, MD  co-enzyme Q-10 50 MG capsule Take 50 mg by mouth daily.     Yes Historical Provider, MD  COMBIGAN 0.2-0.5 % ophthalmic solution Place 1 drop into both eyes 2 (two) times daily. 03/27/15  Yes Historical Provider, MD  digoxin (LANOXIN) 0.25 MG tablet TAKE 1 TABLET DAILY 12/03/14  Yes Mihai Croitoru, MD  furosemide (LASIX) 20 MG tablet TAKE ONE-HALF (1/2) TABLET DAILY 11/13/14  Yes Mihai Croitoru, MD  latanoprost (XALATAN) 0.005 % ophthalmic solution Place 1 drop into both eyes at bedtime. 03/05/14  Yes Historical Provider, MD  losartan (COZAAR) 100 MG tablet TAKE 1 TABLET DAILY 12/03/14  Yes Mihai Croitoru, MD  Omega-3 Fatty Acids (FISH OIL PO) Take 1 capsule by mouth daily.   Yes Historical Provider, MD  oxyCODONE (ROXICODONE) 15 MG immediate release tablet Take 15 mg by mouth 5 (five) times daily as needed for pain.  02/28/15  Yes Historical Provider, MD  SYNTHROID 100 MCG  tablet Take 100 mcg by mouth daily. 03/21/15  Yes Historical Provider, MD  vitamin C (ASCORBIC ACID) 500 MG tablet Take 500 mg by mouth daily.    Yes Historical Provider, MD     Results for orders placed or performed during the hospital encounter of 05/16/15 (from the past 48 hour(s))  CBC with Differential/Platelet     Status: Abnormal   Collection Time: 05/17/15 12:08 AM  Result Value Ref Range   WBC 15.7 (H) 4.0 - 10.5 K/uL   RBC 4.82 3.87 - 5.11 MIL/uL   Hemoglobin 14.7 12.0 - 15.0 g/dL   HCT 43.0 36.0 - 46.0 %   MCV 89.2 78.0 - 100.0 fL   MCH  30.5 26.0 - 34.0 pg   MCHC 34.2 30.0 - 36.0 g/dL   RDW 12.8 11.5 - 15.5 %   Platelets 194 150 - 400 K/uL   Neutrophils Relative % 85 %   Neutro Abs 13.3 (H) 1.7 - 7.7 K/uL   Lymphocytes Relative 6 %   Lymphs Abs 1.0 0.7 - 4.0 K/uL   Monocytes Relative 7 %   Monocytes Absolute 1.1 (H) 0.1 - 1.0 K/uL   Eosinophils Relative 2 %   Eosinophils Absolute 0.3 0.0 - 0.7 K/uL   Basophils Relative 0 %   Basophils Absolute 0.1 0.0 - 0.1 K/uL  Hepatic function panel     Status: Abnormal   Collection Time: 05/17/15 12:08 AM  Result Value Ref Range   Total Protein 7.5 6.5 - 8.1 g/dL   Albumin 3.9 3.5 - 5.0 g/dL   AST 17 15 - 41 U/L   ALT 12 (L) 14 - 54 U/L   Alkaline Phosphatase 67 38 - 126 U/L   Total Bilirubin 0.7 0.3 - 1.2 mg/dL   Bilirubin, Direct 0.2 0.1 - 0.5 mg/dL   Indirect Bilirubin 0.5 0.3 - 0.9 mg/dL  Lipase, blood     Status: None   Collection Time: 05/17/15 12:08 AM  Result Value Ref Range   Lipase 21 11 - 51 U/L  Digoxin level     Status: None   Collection Time: 05/17/15 12:08 AM  Result Value Ref Range   Digoxin Level 1.3 0.8 - 2.0 ng/mL  I-stat troponin, ED     Status: None   Collection Time: 05/17/15 12:17 AM  Result Value Ref Range   Troponin i, poc 0.00 0.00 - 0.08 ng/mL   Comment 3            Comment: Due to the release kinetics of cTnI, a negative result within the first hours of the onset of symptoms does not rule out myocardial infarction with certainty. If myocardial infarction is still suspected, repeat the test at appropriate intervals.   I-stat chem 8, ed     Status: Abnormal   Collection Time: 05/17/15 12:20 AM  Result Value Ref Range   Sodium 133 (L) 135 - 145 mmol/L   Potassium 4.6 3.5 - 5.1 mmol/L   Chloride 98 (L) 101 - 111 mmol/L   BUN 24 (H) 6 - 20 mg/dL   Creatinine, Ser 1.00 0.44 - 1.00 mg/dL   Glucose, Bld 144 (H) 65 - 99 mg/dL   Calcium, Ion 1.09 (L) 1.13 - 1.30 mmol/L   TCO2 26 0 - 100 mmol/L   Hemoglobin 15.6 (H) 12.0 - 15.0 g/dL   HCT  46.0 36.0 - 46.0 %    Dg Chest 2 View  05/17/2015  CLINICAL DATA:  82 year old female with nausea and diaphoresis.  History of ovarian cancer. EXAM: CHEST  2 VIEW COMPARISON:  Radiograph dated 03/28/2015 FINDINGS: Two views of the chest do not demonstrate a focal consolidation. There is no pleural effusion or pneumothorax. There is mild eventration of the left hemidiaphragm. The cardiac silhouette is within normal limits. Left pectoral AICD device noted. There is osteopenia with degenerative changes of the spine. No acute fracture. IMPRESSION: No active cardiopulmonary disease. Electronically Signed   By: Anner Crete M.D.   On: 05/17/2015 00:11   Dg Abd 1 View  05/17/2015  CLINICAL DATA:  NGT placement EXAM: ABDOMEN - 1 VIEW COMPARISON:  03/29/2015 FINDINGS: Enteric tube with tip in the right mid abdomen consistent with location in the distal stomach. Scattered gas and stool throughout the colon. No small or large bowel distention. Residual contrast material is demonstrated in the bladder and in the right renal collecting system. There is dilatation of the right renal collecting system suggesting UPJ obstruction. Degenerative changes in the spine. IMPRESSION: Enteric tube tip is in the right mid abdomen consistent with location in the distal stomach. Residual contrast material within dilated right renal collecting system suggesting UPJ obstruction. Electronically Signed   By: Lucienne Capers M.D.   On: 05/17/2015 03:22   Ct Abdomen Pelvis W Contrast  05/17/2015  CLINICAL DATA:  82 year old female with abdominal pain, nausea vomiting. EXAM: CT ABDOMEN AND PELVIS WITH CONTRAST TECHNIQUE: Multidetector CT imaging of the abdomen and pelvis was performed using the standard protocol following bolus administration of intravenous contrast. CONTRAST:  39mL OMNIPAQUE IOHEXOL 300 MG/ML SOLN, 170mL OMNIPAQUE IOHEXOL 300 MG/ML SOLN COMPARISON:  CT dated 03/28/2015 FINDINGS: The visualized lung bases are clear.  Mild cardiomegaly. Cardiac pacemaker wires noted. No intra-abdominal free air. Small free fluid within the pelvis. Cholecystectomy. Mild biliary ductal dilatation, likely post cholecystectomy. The liver appears unremarkable. The pancreas is atrophic. The spleen appears unremarkable. There is left adrenal thickening/ hyperplasia. There is an indeterminate stable 1.5 cm right adrenal nodule. There is moderate left renal atrophy. There is no hydronephrosis on the left. There is an extrarenal pelvis on the left with mild fullness of the renal pelvis similar to prior study. The visualized ureters and urinary bladder appear unremarkable. Hysterectomy. There is postsurgical changes of bowel resection with multiple anastomotic sutures. Multiple mildly dilated loops of proximal and mid small bowel noted. The distal small bowel demonstrate a normal caliber. A transition zone is noted in the left anterior lower abdomen (series 2 image 37) where there is abutment of bowel to the anterior peritoneal wall and adhesion. There is mild thickening of the bowel at this site. There is a broad-based ventral hernia along the midline vertical anterior pelvic wall incisional scar. There is abutment of loops of small bowel to the anterior peritoneal wall compatible with adhesions. A left periumbilical hernia defect containing a short segment of small bowel noted. There is no evidence of bowel obstruction at this site. There is aortoiliac atherosclerotic disease. The abdominal aorta is tortuous and ectatic. No portal venous gas identified. There is no adenopathy. There is osteopenia with scoliosis and extensive degenerative changes of the spine. 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 No acute fracture. IMPRESSION: Small-bowel obstruction with transition zone in the left anterior lower abdomen secondary to peritoneal adhesions. Other nonacute findings as seen on the prior exam and described above. Electronically Signed   By:  Anner Crete M.D.   On: 05/17/2015 02:02    Review of Systems  Constitutional: Positive for chills.  Negative for fever.  HENT: Negative for congestion.   Respiratory: Negative for shortness of breath.   Cardiovascular: Negative for chest pain.  Gastrointestinal: Positive for nausea, vomiting and abdominal pain. Negative for blood in stool.  Genitourinary: Negative for hematuria.   Blood pressure 142/77, pulse 70, temperature 97.5 F (36.4 C), temperature source Oral, resp. rate 14, SpO2 95 %. Physical Exam  Constitutional:  Elderly female in no acute distress.  HENT:  Head: Atraumatic.  Nasogastric tube and left nares with green output  Eyes: No scleral icterus.  Cardiovascular: Normal rate and regular rhythm.   Respiratory:  Pacemaker palpable in left upper chest wall. Breath sounds equal and clear.  GI: Soft. She exhibits distension. She exhibits no mass. There is no tenderness.  Hypoactive bowel sounds.  Long midline incision with palpable, reducible hernia  Neurological: She is alert.  Skin: Skin is warm and dry.    Assessment/Plan: Recurrent small bowel obstruction appears to be due to adhesive disease. She has multiple other comorbidities as well is a history of stage IV ovarian cancer. She is nervous about having any surgery and would prefer not to have an operation if at all possible.  Plan: Attempt nonoperative management with nasogastric tube decompression and IV fluid hydration. Repeat x-rays in the morning. If she fails this management, she understands that an operation may be the only way to correct the problem.  [name] J 05/17/2015, 7:55 AM

## 2015-05-17 NOTE — H&P (Signed)
[facility] History and Physical  [name] [id number] DOB: 06/14/1928 DOA: 05/16/2015  Referring physician: ED PCP: [place], BETTY G, MD   Chief Complaint: Nausea and vomiting  HPI:  [name] is a 82 year old female with a past medical history significant for HTN, SBO, hypothyroidism, combined systolic and diastolic CHF EF normal in 2009, s/p AICD; presents with with complaints of nausea and vomiting. Symptoms started acutely around 7 PM tonight. Patient notes vomiting up stomach contents. She notes vomiting multiple times prior to calling EMS.  Denies trying anything to relieve symptoms. Denies having any fever, chills, abdominal pain, or recent sick contacts. Reports associated symptoms of constipation last bowel movement was approximately 2 days ago.  Upon arrival patient was seen to have a CT scan which showed signs of a left anterior lower small bowel obstruction secondary to what appears to be peritoneal adhesions. Gen. surgery consulted and recommended a [name]-tube placement and they will follow. [facility] to admit.      Review of Systems  Constitutional: Negative for fever and chills.  HENT: Negative for ear discharge and ear pain.   Eyes: Negative for double vision and pain.  Cardiovascular: Negative for chest pain, palpitations and leg swelling.  Gastrointestinal: Positive for nausea, vomiting and constipation.  Genitourinary: Negative for urgency and frequency.  Musculoskeletal: Negative for back pain and neck pain.  Skin: Negative for itching and rash.  Neurological: Negative for speech change and focal weakness.  Endo/Heme/Allergies: Negative for environmental allergies and polydipsia.  Psychiatric/Behavioral: Negative for hallucinations.       Past Medical History  Diagnosis Date  . AICD (automatic cardioverter/defibrillator) present 06/27/2007    medtronic  . Thyroid disease   . Cancer ([place])     ovarian--stage 4  . Cardiomyopathy ([place])   . CHF  (congestive heart failure) ([place])   . Systemic hypertension   . Peripheral neuropathy [facility]      Past Surgical History  Procedure Laterality Date  . Appendectomy    . Cholecystectomy    . Cardiac defibrillator placement  06/27/2007    medtronic concerto  . Abdominal hysterectomy  oct 2000    along with ovarian ca surg  . Cardiac catheterization  09/27/1990    normal coronaries,dilated cardiomyopathy  . Nm myocar perf wall motion  06/05/2007    no ischemia  . Implantable cardioverter defibrillator generator change N/A 07/31/2013    Procedure: IMPLANTABLE CARDIOVERTER DEFIBRILLATOR GENERATOR CHANGE;  Surgeon: Sanda Klein, MD;  Location: [facility] CATH LAB;  Service: Cardiovascular;  Laterality: N/A;      Social History:  reports that she has never smoked. She does not have any smokeless tobacco history on file. She reports that she does not drink alcohol or use illicit drugs.   No Known Allergies  Family History  Problem Relation Age of Onset  . Heart disease Paternal Grandmother   . Lung cancer Mother     died from cancer  . Lung cancer Father        Prior to Admission medications   Medication Sig Start Date End Date Taking? Authorizing Provider  calcium-vitamin D (OSCAL WITH D) 250-125 MG-UNIT per tablet Take 1 tablet by mouth daily.     Yes Historical Provider, MD  carvedilol (COREG) 12.5 MG tablet Take 1 tablet (12.5 mg total) by mouth 2 (two) times daily with a meal. 04/22/15  Yes Mihai Croitoru, MD  co-enzyme Q-10 50 MG capsule Take 50 mg by mouth daily.     Yes Historical Provider,  MD  COMBIGAN 0.2-0.5 % ophthalmic solution Place 1 drop into both eyes 2 (two) times daily. 03/27/15  Yes Historical Provider, MD  digoxin (LANOXIN) 0.25 MG tablet TAKE 1 TABLET DAILY 12/03/14  Yes Mihai Croitoru, MD  furosemide (LASIX) 20 MG tablet TAKE ONE-HALF (1/2) TABLET DAILY 11/13/14  Yes Mihai Croitoru, MD  latanoprost (XALATAN) 0.005 % ophthalmic solution Place 1 drop into both eyes at bedtime.  03/05/14  Yes Historical Provider, MD  losartan (COZAAR) 100 MG tablet TAKE 1 TABLET DAILY 12/03/14  Yes Mihai Croitoru, MD  Omega-3 Fatty Acids (FISH OIL PO) Take 1 capsule by mouth daily.   Yes Historical Provider, MD  oxyCODONE (ROXICODONE) 15 MG immediate release tablet Take 15 mg by mouth 5 (five) times daily as needed for pain.  02/28/15  Yes Historical Provider, MD  SYNTHROID 100 MCG tablet Take 100 mcg by mouth daily. 03/21/15  Yes Historical Provider, MD  vitamin C (ASCORBIC ACID) 500 MG tablet Take 500 mg by mouth daily.    Yes Historical Provider, MD     Physical Exam: Filed Vitals:   05/16/15 2235 05/17/15 0042 05/17/15 0100 05/17/15 0230  BP: 202/78 159/86 170/136 183/79  Pulse: 68 72 77 74  Temp: 97.9 F (36.6 C) 97.5 F (36.4 C)    TempSrc: Oral Oral    Resp: 16 17 20 15   SpO2: 93% 95% 97% 90%     Constitutional: Vital signs reviewed. Patient is a well-developed and well-nourished in no acute distress and cooperative with exam. Alert and oriented x3.  Head: Normocephalic and atraumatic  Ear: TM normal bilaterally  Mouth: no erythema or exudates, MMM  Eyes: PERRL, EOMI, conjunctivae normal, No scleral icterus.  Neck: Supple, Trachea midline normal ROM, No JVD, mass, thyromegaly, or carotid bruit present.  Cardiovascular: RRR, S1 normal, S2 normal, + murmur, pulses symmetric and intact bilaterally  Pulmonary/Chest: CTAB, no wheezes, rales, or rhonchi  Abdominal: Soft. Non-tender, appears mildly distended, bowel sounds are normal, no masses, organomegaly, or guarding present.  GU: no CVA tenderness Musculoskeletal: No joint deformities, erythema, or stiffness, ROM full and no nontender Ext: no edema and no cyanosis, pulses palpable bilaterally (DP and PT)  Hematology: no cervical, inginal, or axillary adenopathy.  Neurological: A&O x3, Strenght is normal and symmetric bilaterally, cranial nerve II-XII are grossly intact, no focal motor deficit, sensory intact to light touch  bilaterally.  Skin: Warm, dry and intact. No rash, cyanosis, or clubbing.  Psychiatric: Normal mood and affect. speech and behavior is normal. Judgment and thought content normal. Cognition and memory are normal.      Data Review   Micro Results No results found for this or any previous visit (from the past 240 hour(s)).  Radiology Reports Dg Chest 2 View  05/17/2015  CLINICAL DATA:  82 year old female with nausea and diaphoresis. History of ovarian cancer. EXAM: CHEST  2 VIEW COMPARISON:  Radiograph dated 03/28/2015 FINDINGS: Two views of the chest do not demonstrate a focal consolidation. There is no pleural effusion or pneumothorax. There is mild eventration of the left hemidiaphragm. The cardiac silhouette is within normal limits. Left pectoral AICD device noted. There is osteopenia with degenerative changes of the spine. No acute fracture. IMPRESSION: No active cardiopulmonary disease. Electronically Signed   By: Anner Crete M.D.   On: 05/17/2015 00:11   Ct Abdomen Pelvis W Contrast  05/17/2015  CLINICAL DATA:  82 year old female with abdominal pain, nausea vomiting. EXAM: CT ABDOMEN AND PELVIS WITH CONTRAST TECHNIQUE: Multidetector CT imaging  of the abdomen and pelvis was performed using the standard protocol following bolus administration of intravenous contrast. CONTRAST:  33mL OMNIPAQUE IOHEXOL 300 MG/ML SOLN, 153mL OMNIPAQUE IOHEXOL 300 MG/ML SOLN COMPARISON:  CT dated 03/28/2015 FINDINGS: The visualized lung bases are clear. Mild cardiomegaly. Cardiac pacemaker wires noted. No intra-abdominal free air. Small free fluid within the pelvis. Cholecystectomy. Mild biliary ductal dilatation, likely post cholecystectomy. The liver appears unremarkable. The pancreas is atrophic. The spleen appears unremarkable. There is left adrenal thickening/ hyperplasia. There is an indeterminate stable 1.5 cm right adrenal nodule. There is moderate left renal atrophy. There is no hydronephrosis on the  left. There is an extrarenal pelvis on the left with mild fullness of the renal pelvis similar to prior study. The visualized ureters and urinary bladder appear unremarkable. Hysterectomy. There is postsurgical changes of bowel resection with multiple anastomotic sutures. Multiple mildly dilated loops of proximal and mid small bowel noted. The distal small bowel demonstrate a normal caliber. A transition zone is noted in the left anterior lower abdomen (series 2 image 37) where there is abutment of bowel to the anterior peritoneal wall and adhesion. There is mild thickening of the bowel at this site. There is a broad-based ventral hernia along the midline vertical anterior pelvic wall incisional scar. There is abutment of loops of small bowel to the anterior peritoneal wall compatible with adhesions. A left periumbilical hernia defect containing a short segment of small bowel noted. There is no evidence of bowel obstruction at this site. There is aortoiliac atherosclerotic disease. The abdominal aorta is tortuous and ectatic. No portal venous gas identified. There is no adenopathy. There is osteopenia with scoliosis and extensive degenerative changes of the spine. 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 No acute fracture. IMPRESSION: Small-bowel obstruction with transition zone in the left anterior lower abdomen secondary to peritoneal adhesions. Other nonacute findings as seen on the prior exam and described above. Electronically Signed   By: Anner Crete M.D.   On: 05/17/2015 02:02     CBC  Recent Labs Lab 05/17/15 0008 05/17/15 0020  WBC 15.7*  --   HGB 14.7 15.6*  HCT 43.0 46.0  PLT 194  --   MCV 89.2  --   MCH 30.5  --   MCHC 34.2  --   RDW 12.8  --   LYMPHSABS 1.0  --   MONOABS 1.1*  --   EOSABS 0.3  --   BASOSABS 0.1  --     Chemistries   Recent Labs Lab 05/17/15 0008 05/17/15 0020  NA  --  133*  K  --  4.6  CL  --  98*  GLUCOSE  --  144*  BUN  --  24*   CREATININE  --  1.00  AST 17  --   ALT 12*  --   ALKPHOS 67  --   BILITOT 0.7  --    ------------------------------------------------------------------------------------------------------------------ CrCl cannot be calculated (Unknown ideal weight.). ------------------------------------------------------------------------------------------------------------------ No results for input(s): HGBA1C in the last 72 hours. ------------------------------------------------------------------------------------------------------------------ No results for input(s): CHOL, HDL, LDLCALC, TRIG, CHOLHDL, LDLDIRECT in the last 72 hours. ------------------------------------------------------------------------------------------------------------------ No results for input(s): TSH, T4TOTAL, T3FREE, THYROIDAB in the last 72 hours.  Invalid input(s): FREET3 ------------------------------------------------------------------------------------------------------------------ No results for input(s): VITAMINB12, FOLATE, FERRITIN, TIBC, IRON, RETICCTPCT in the last 72 hours.  Coagulation profile No results for input(s): INR, PROTIME in the last 168 hours.  No results for input(s): DDIMER in the last 72 hours.  Cardiac Enzymes  No results for input(s): CKMB, TROPONINI, MYOGLOBIN in the last 168 hours.  Invalid input(s): CK ------------------------------------------------------------------------------------------------------------------ Invalid input(s): POCBNP   CBG: No results for input(s): GLUCAP in the last 168 hours.     EKG: Independently reviewed. Paced rhythm   Assessment/Plan Small bowel obstruction: Acute. CT scan showing a small bowel obstruction of the left anterior lower abdomen secondary to peritoneal adhesions. Question if patient's history of narcotic use is also contributing factor. - Admit  to a Telemetry bed - NPO, allowing for bowel rest - continue NGT to suction  - gentle IVF    Leukocytosis: Acute. WBC 15.7 Question if this is reactive or infectious etiology - continue to monitor off antibiotics for now  Hypocalcemia: Ionized calcium was 1.09 on admission - 1 [name] of IV calcium gluconate now  - Continue to monitor and replace as needed   Hypothyroidism - Consider converting levothyroxine to IV if patient to be nothing by mouth for prolonged period time  Hypertension - hydralazine IV prn - Restart losartan when able  S/p AICD: Stable  Code Status:   full Family Communication: bedside Disposition Plan: admit   Total time spent 55 minutes.Greater than 50% of this time was spent in counseling, explanation of diagnosis, planning of further management, and coordination of care  [facility] Pager [phone]  If 7PM-7AM, please contact night-coverage [email or web address] Password [email or web address] 05/17/2015, 3:01 AM

## 2015-05-18 ENCOUNTER — Inpatient Hospital Stay (HOSPITAL_COMMUNITY): Payer: Medicare Other

## 2015-05-18 DIAGNOSIS — E038 Other specified hypothyroidism: Secondary | ICD-10-CM

## 2015-05-18 LAB — BASIC METABOLIC PANEL
Anion gap: 7 (ref 5–15)
BUN: 25 mg/dL — ABNORMAL HIGH (ref 6–20)
CALCIUM: 8.5 mg/dL — AB (ref 8.9–10.3)
CHLORIDE: 98 mmol/L — AB (ref 101–111)
CO2: 33 mmol/L — AB (ref 22–32)
CREATININE: 0.77 mg/dL (ref 0.44–1.00)
GFR calc Af Amer: >60 mL/min (ref >60)
GFR calc non Af Amer: >60 mL/min (ref >60)
GLUCOSE: 105 mg/dL — AB (ref 65–99)
Potassium: 4 mmol/L (ref 3.5–5.1)
Sodium: 138 mmol/L (ref 135–145)

## 2015-05-18 LAB — CBC
HCT: 37.4 % (ref 36.0–46.0)
HEMOGLOBIN: 12.5 g/dL (ref 12.0–15.0)
MCH: 30.6 pg (ref 26.0–34.0)
MCHC: 33.4 g/dL (ref 30.0–36.0)
MCV: 91.7 fL (ref 78.0–100.0)
PLATELETS: 171 10*3/uL (ref 150–400)
RBC: 4.08 MIL/uL (ref 3.87–5.11)
RDW: 12.9 % (ref 11.5–15.5)
WBC: 7.6 10*3/uL (ref 4.0–10.5)

## 2015-05-18 MED ORDER — LORAZEPAM 2 MG/ML IJ SOLN
0.2500 mg | Freq: Once | INTRAMUSCULAR | Status: AC
  Administered 2015-05-18: 0.25 mg via INTRAVENOUS
  Filled 2015-05-18: qty 1

## 2015-05-18 MED ORDER — LIDOCAINE 5 % EX PTCH
1.0000 | MEDICATED_PATCH | Freq: Every day | CUTANEOUS | Status: DC
  Administered 2015-05-18 – 2015-05-20 (×3): 1 via TRANSDERMAL
  Filled 2015-05-18 (×3): qty 1

## 2015-05-18 MED ORDER — LORAZEPAM 2 MG/ML IJ SOLN
0.2500 mg | Freq: Once | INTRAMUSCULAR | Status: AC
Start: 2015-05-18 — End: 2015-05-18
  Administered 2015-05-18: 0.25 mg via INTRAVENOUS
  Filled 2015-05-18: qty 1

## 2015-05-18 NOTE — Clinical Social Work Note (Signed)
Clinical Social Work Assessment  Patient Details  Name: [name] MRN: [id number] Date of Birth: July 16, 1928  Date of referral:  05/17/15               Reason for consult:  Other (Comment Required) (assess for needs)                Permission sought to share information with:    Permission granted to share information::     Name::        Agency::     Relationship::     Contact Information:     Housing/Transportation Living arrangements for the past 2 months:  Single Family Home Source of Information:  Patient Patient Interpreter Needed:  None Criminal Activity/Legal Involvement Pertinent to Current Situation/Hospitalization:    Significant Relationships:  Adult Children Lives with:  Adult Children (son and daughter in law) Do you feel safe going back to the place where you live?  Yes Need for family participation in patient care:  Yes (Comment)  Care giving concerns:  No caregiver   Social Worker assessment / plan:  CSW met with pt at bedside to discuss discharge needs.  CSW explained role and prompted pt to discuss her history and needs.  CSW provided supportive listening and discussed possible services pt may access at discharge if needed.  CSW will continue to follow up with pt until discharge to assess for needs  Employment status:  Retired Forensic scientist:  Medicare PT Recommendations:  Not assessed at this time Information / Referral to community resources:     Patient/Family's Response to care:  Pt discussed her husband suddenly passing away in December after 62 years of marriage.  She moved in with her son and daughter in law and has been staying with them since December.  Pt reported that she has been talking with her children about what her plans will be and may return home or go into a retirement community or assisted living.  Her home is paid for so she is worried about the expense of an assisted living facility.  Pt reported that she did not need any services  at this time and will continue to speak to her children about her plans.    Patient/Family's Understanding of and Emotional Response to Diagnosis, Current Treatment, and Prognosis:  Pt displayed sadness over the loss of her husband stating that he was not sick and their lives were wonderful.  Pt appreciative of CSW support and appears to have support system in place for her needs.  Emotional Assessment Appearance:  Appears stated age Attitude/Demeanor/Rapport:  Lethargic Affect (typically observed):  Accepting Orientation:  Oriented to Self, Oriented to Place, Oriented to  Time, Oriented to Situation Alcohol / Substance use:    Psych involvement (Current and /or in the community):  No (Comment)  Discharge Needs  Concerns to be addressed:  No discharge needs identified Readmission within the last 30 days:    Current discharge risk:    Barriers to Discharge:  No Barriers Identified   Carlean Jews, LCSW 05/18/2015, 8:03 AM

## 2015-05-18 NOTE — Progress Notes (Signed)
Subjective: Feels better. No nausea. +flatus. Wants coffee  Objective: Vital signs in last 24 hours: Temp:  [97.6 F (36.4 C)-98.2 F (36.8 C)] 98.2 F (36.8 C) (02/19 0512) Pulse Rate:  [60-72] 72 (02/19 0705) Resp:  [16-18] 18 (02/19 0512) BP: (150-191)/(56-71) 163/66 mmHg (02/19 0705) SpO2:  [91 %-96 %] 95 % (02/19 0512) Weight:  [58.968 kg (130 lb)] 58.968 kg (130 lb) (02/18 1208) Last BM Date: 05/14/15  Intake/Output from previous day: 02/18 0701 - 02/19 0700 In: 1346.3 [I.V.:1346.3] Out: 1900 [Urine:450; Emesis/NG output:1450] Intake/Output this shift:    Alert, nontoxic Soft, nt, old scar. Reducible hernia.   Lab Results:   Recent Labs  05/17/15 0008 05/17/15 0020 05/18/15 0600  WBC 15.7*  --  7.6  HGB 14.7 15.6* 12.5  HCT 43.0 46.0 37.4  PLT 194  --  171   BMET  Recent Labs  05/17/15 0020 05/18/15 0600  NA 133* 138  K 4.6 4.0  CL 98* 98*  CO2  --  33*  GLUCOSE 144* 105*  BUN 24* 25*  CREATININE 1.00 0.77  CALCIUM  --  8.5*   PT/INR No results for input(s): LABPROT, INR in the last 72 hours. ABG No results for input(s): PHART, HCO3 in the last 72 hours.  Invalid input(s): PCO2, PO2  Studies/Results: Dg Chest 2 View  05/17/2015  CLINICAL DATA:  82 year old female with nausea and diaphoresis. History of ovarian cancer. EXAM: CHEST  2 VIEW COMPARISON:  Radiograph dated 03/28/2015 FINDINGS: Two views of the chest do not demonstrate a focal consolidation. There is no pleural effusion or pneumothorax. There is mild eventration of the left hemidiaphragm. The cardiac silhouette is within normal limits. Left pectoral AICD device noted. There is osteopenia with degenerative changes of the spine. No acute fracture. IMPRESSION: No active cardiopulmonary disease. Electronically Signed   By: Anner Crete M.D.   On: 05/17/2015 00:11   Dg Abd 1 View  05/17/2015  CLINICAL DATA:  NGT placement EXAM: ABDOMEN - 1 VIEW COMPARISON:  03/29/2015 FINDINGS:  Enteric tube with tip in the right mid abdomen consistent with location in the distal stomach. Scattered gas and stool throughout the colon. No small or large bowel distention. Residual contrast material is demonstrated in the bladder and in the right renal collecting system. There is dilatation of the right renal collecting system suggesting UPJ obstruction. Degenerative changes in the spine. IMPRESSION: Enteric tube tip is in the right mid abdomen consistent with location in the distal stomach. Residual contrast material within dilated right renal collecting system suggesting UPJ obstruction. Electronically Signed   By: Lucienne Capers M.D.   On: 05/17/2015 03:22   Ct Abdomen Pelvis W Contrast  05/17/2015  CLINICAL DATA:  82 year old female with abdominal pain, nausea vomiting. EXAM: CT ABDOMEN AND PELVIS WITH CONTRAST TECHNIQUE: Multidetector CT imaging of the abdomen and pelvis was performed using the standard protocol following bolus administration of intravenous contrast. CONTRAST:  9mL OMNIPAQUE IOHEXOL 300 MG/ML SOLN, 167mL OMNIPAQUE IOHEXOL 300 MG/ML SOLN COMPARISON:  CT dated 03/28/2015 FINDINGS: The visualized lung bases are clear. Mild cardiomegaly. Cardiac pacemaker wires noted. No intra-abdominal free air. Small free fluid within the pelvis. Cholecystectomy. Mild biliary ductal dilatation, likely post cholecystectomy. The liver appears unremarkable. The pancreas is atrophic. The spleen appears unremarkable. There is left adrenal thickening/ hyperplasia. There is an indeterminate stable 1.5 cm right adrenal nodule. There is moderate left renal atrophy. There is no hydronephrosis on the left. There is an extrarenal pelvis on the  left with mild fullness of the renal pelvis similar to prior study. The visualized ureters and urinary bladder appear unremarkable. Hysterectomy. There is postsurgical changes of bowel resection with multiple anastomotic sutures. Multiple mildly dilated loops of proximal and  mid small bowel noted. The distal small bowel demonstrate a normal caliber. A transition zone is noted in the left anterior lower abdomen (series 2 image 37) where there is abutment of bowel to the anterior peritoneal wall and adhesion. There is mild thickening of the bowel at this site. There is a broad-based ventral hernia along the midline vertical anterior pelvic wall incisional scar. There is abutment of loops of small bowel to the anterior peritoneal wall compatible with adhesions. A left periumbilical hernia defect containing a short segment of small bowel noted. There is no evidence of bowel obstruction at this site. There is aortoiliac atherosclerotic disease. The abdominal aorta is tortuous and ectatic. No portal venous gas identified. There is no adenopathy. There is osteopenia with scoliosis and extensive degenerative changes of the spine. 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 No acute fracture. IMPRESSION: Small-bowel obstruction with transition zone in the left anterior lower abdomen secondary to peritoneal adhesions. Other nonacute findings as seen on the prior exam and described above. Electronically Signed   By: Anner Crete M.D.   On: 05/17/2015 02:02    Anti-infectives: Anti-infectives    None      Assessment/Plan: Incisional hernia Recurrent pSBO - adhesive disease probably  Improving. Flatus. NG put out 1450 bile/24hrs. It is in too far. Asked nurse to pull back. Believe this accounts for high ng output. Air in colon.   Cont NG tube today. Probable clamp and remove Monday Will follow  Leighton Ruff. Redmond Pulling, MD, FACS General, Bariatric, & Minimally Invasive Surgery [facility], [place]    LOS: 1 day    Gayland Curry 05/18/2015

## 2015-05-18 NOTE — Progress Notes (Signed)
[facility] PROGRESS NOTE  KIRBY BRULL [id number] DOB: [birth year] DOA: 05/16/2015 PCP: [place], BETTY G, MD  Assessment/Plan: 1. Small bowel obstruction.  -Mrs Brugman is a pleasant 82 year old female having a history of undergoing multiple abdominal surgeries including bowel resections as well as adhesional small bowel obstructions. -She presents with complaints of nausea and vomiting as CT scan of abdomen and pelvis revealed small bowel obstruction -She was seen and evaluated by general surgery. -She was made nothing by mouth with placement of NG tube. IV fluids running. -Evaluation she reports improvement to abdominal pain. Passing flatus -On 05/18/2015 she reports feeling a little better, passing flatus. Having positive bowel sounds on exam.  -Pending abdominal films.   2.  Hypothyroidism. -Patient having a history of hypothyroidism had been on Synthroid 100 g by mouth daily -NG tube placed as she is nothing by mouth for small bowel obstruction. Will continue Synthroid IV at 50 g daily.  3.  History of hypertension. -As mentioned above patient was nothing by mouth with NG tube placed for small bowel obstruction -We will discontinue oral antihypertensive agents and provide IV Lopressor 5 mg every 2 hours as needed for systolic blood pressures greater than 165.  4.  Leukocytosis. -Like secondary to small bowel obstruction  5.  History of diastolic congestive heart failure -Last transthoracic echocardiogram performed in 2009 showing ejection fraction of 123456 with diastolic dysfunction. -Diuretics held since she is currently nothing by mouth. Providing gentle IV fluid hydration with normal saline running at 75 mL/per hour -Will watch while status carefully. She remains euvolemic.   Code Status: Full Code Family Communication:  Disposition Plan: Continue nothing by mouth status, NG tube placement, IV fluids   Consultants:  Gen. surgery  Procedures:  NG tube  placement  HPI/Subjective: Ms Toomes is a pleasant 82 year old female with a past medical history of hypertension, history of small bowel obstruction admitted to medical service on 05/17/2015 she presented with complaints of nausea and vomiting. She has a history of undergoing multiple abdominal surgeries including bowel resections. She was further worked up with a CT scan of abdomen and pelvis that revealed the presence of a small bowel obstruction with transition zone in the left anterior lower abdominal region. Small bowel obstruction likely secondary to peritoneal adhesions. Gen. surgery was consulted. NG tube was placed.  Objective: Filed Vitals:   05/18/15 0512 05/18/15 0705  BP: 191/71 163/66  Pulse: 61 72  Temp: 98.2 F (36.8 C)   Resp: 18     Intake/Output Summary (Last 24 hours) at 05/18/15 0719 Last data filed at 05/18/15 0600  Gross per 24 hour  Intake 1346.25 ml  Output   1650 ml  Net -303.75 ml   Filed Weights   05/17/15 1208  Weight: 58.968 kg (130 lb)    Exam:   General:  Awake and alert, nontoxic-appearing, reports passing flatus  Cardiovascular: Regular rate and rhythm normal S1-S2, no extremity edema  Respiratory: Normal respiratory effort  Abdomen: Improved distention,has positive bowel sounds, mild generalized tenderness to palpation  Musculoskeletal: No edema  Data Reviewed: Basic Metabolic Panel:  Recent Labs Lab 05/17/15 0020 05/18/15 0600  NA 133* 138  K 4.6 4.0  CL 98* 98*  CO2  --  33*  GLUCOSE 144* 105*  BUN 24* 25*  CREATININE 1.00 0.77  CALCIUM  --  8.5*   Liver Function Tests:  Recent Labs Lab 05/17/15 0008  AST 17  ALT 12*  ALKPHOS 67  BILITOT 0.7  PROT  7.5  ALBUMIN 3.9    Recent Labs Lab 05/17/15 0008  LIPASE 21   No results for input(s): AMMONIA in the last 168 hours. CBC:  Recent Labs Lab 05/17/15 0008 05/17/15 0020 05/18/15 0600  WBC 15.7*  --  7.6  NEUTROABS 13.3*  --   --   HGB 14.7 15.6* 12.5   HCT 43.0 46.0 37.4  MCV 89.2  --  91.7  PLT 194  --  171   Cardiac Enzymes: No results for input(s): CKTOTAL, CKMB, CKMBINDEX, TROPONINI in the last 168 hours. BNP (last 3 results) No results for input(s): BNP in the last 8760 hours.  ProBNP (last 3 results) No results for input(s): PROBNP in the last 8760 hours.  CBG: No results for input(s): GLUCAP in the last 168 hours.  No results found for this or any previous visit (from the past 240 hour(s)).   Studies: Dg Chest 2 View  05/17/2015  CLINICAL DATA:  82 year old female with nausea and diaphoresis. History of ovarian cancer. EXAM: CHEST  2 VIEW COMPARISON:  Radiograph dated 03/28/2015 FINDINGS: Two views of the chest do not demonstrate a focal consolidation. There is no pleural effusion or pneumothorax. There is mild eventration of the left hemidiaphragm. The cardiac silhouette is within normal limits. Left pectoral AICD device noted. There is osteopenia with degenerative changes of the spine. No acute fracture. IMPRESSION: No active cardiopulmonary disease. Electronically Signed   By: Anner Crete M.D.   On: 05/17/2015 00:11   Dg Abd 1 View  05/17/2015  CLINICAL DATA:  NGT placement EXAM: ABDOMEN - 1 VIEW COMPARISON:  03/29/2015 FINDINGS: Enteric tube with tip in the right mid abdomen consistent with location in the distal stomach. Scattered gas and stool throughout the colon. No small or large bowel distention. Residual contrast material is demonstrated in the bladder and in the right renal collecting system. There is dilatation of the right renal collecting system suggesting UPJ obstruction. Degenerative changes in the spine. IMPRESSION: Enteric tube tip is in the right mid abdomen consistent with location in the distal stomach. Residual contrast material within dilated right renal collecting system suggesting UPJ obstruction. Electronically Signed   By: Lucienne Capers M.D.   On: 05/17/2015 03:22   Ct Abdomen Pelvis W  Contrast  05/17/2015  CLINICAL DATA:  82 year old female with abdominal pain, nausea vomiting. EXAM: CT ABDOMEN AND PELVIS WITH CONTRAST TECHNIQUE: Multidetector CT imaging of the abdomen and pelvis was performed using the standard protocol following bolus administration of intravenous contrast. CONTRAST:  77mL OMNIPAQUE IOHEXOL 300 MG/ML SOLN, 144mL OMNIPAQUE IOHEXOL 300 MG/ML SOLN COMPARISON:  CT dated 03/28/2015 FINDINGS: The visualized lung bases are clear. Mild cardiomegaly. Cardiac pacemaker wires noted. No intra-abdominal free air. Small free fluid within the pelvis. Cholecystectomy. Mild biliary ductal dilatation, likely post cholecystectomy. The liver appears unremarkable. The pancreas is atrophic. The spleen appears unremarkable. There is left adrenal thickening/ hyperplasia. There is an indeterminate stable 1.5 cm right adrenal nodule. There is moderate left renal atrophy. There is no hydronephrosis on the left. There is an extrarenal pelvis on the left with mild fullness of the renal pelvis similar to prior study. The visualized ureters and urinary bladder appear unremarkable. Hysterectomy. There is postsurgical changes of bowel resection with multiple anastomotic sutures. Multiple mildly dilated loops of proximal and mid small bowel noted. The distal small bowel demonstrate a normal caliber. A transition zone is noted in the left anterior lower abdomen (series 2 image 37) where there is abutment of  bowel to the anterior peritoneal wall and adhesion. There is mild thickening of the bowel at this site. There is a broad-based ventral hernia along the midline vertical anterior pelvic wall incisional scar. There is abutment of loops of small bowel to the anterior peritoneal wall compatible with adhesions. A left periumbilical hernia defect containing a short segment of small bowel noted. There is no evidence of bowel obstruction at this site. There is aortoiliac atherosclerotic disease. The abdominal aorta  is tortuous and ectatic. No portal venous gas identified. There is no adenopathy. There is osteopenia with scoliosis and extensive degenerative changes of the spine. 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 No acute fracture. IMPRESSION: Small-bowel obstruction with transition zone in the left anterior lower abdomen secondary to peritoneal adhesions. Other nonacute findings as seen on the prior exam and described above. Electronically Signed   By: Anner Crete M.D.   On: 05/17/2015 02:02    Scheduled Meds: . brimonidine  1 drop Both Eyes BID  . enoxaparin (LOVENOX) injection  40 mg Subcutaneous Q24H  . latanoprost  1 drop Both Eyes QHS  . levothyroxine  50 mcg Intravenous Daily  . lidocaine  1 patch Transdermal Daily  . sodium chloride flush  3 mL Intravenous Q12H  . timolol  1 drop Both Eyes BID   Continuous Infusions: . sodium chloride 75 mL/hr at 05/18/15 0557    Principal Problem:   Small bowel obstruction [facility]) Active Problems:   Biventricular ICD Medtronic Concerto implanted 123XX123   Hypothyroidism   Essential hypertension   Hypocalcemia   Leukocytosis    Time spent: 35 min    Kensie Susman  [facility] Pager [phone]. If 7PM-7AM, please contact night-coverage at [email or web address], password [facility] 05/18/2015, 7:19 AM  LOS: 1 day

## 2015-05-19 LAB — CBC
HEMATOCRIT: 38.4 % (ref 36.0–46.0)
HEMOGLOBIN: 12.5 g/dL (ref 12.0–15.0)
MCH: 30.4 pg (ref 26.0–34.0)
MCHC: 32.6 g/dL (ref 30.0–36.0)
MCV: 93.4 fL (ref 78.0–100.0)
Platelets: 182 10*3/uL (ref 150–400)
RBC: 4.11 MIL/uL (ref 3.87–5.11)
RDW: 12.9 % (ref 11.5–15.5)
WBC: 7.5 10*3/uL (ref 4.0–10.5)

## 2015-05-19 LAB — BASIC METABOLIC PANEL
ANION GAP: 13 (ref 5–15)
BUN: 28 mg/dL — ABNORMAL HIGH (ref 6–20)
CO2: 25 mmol/L (ref 22–32)
Calcium: 8.6 mg/dL — ABNORMAL LOW (ref 8.9–10.3)
Chloride: 105 mmol/L (ref 101–111)
Creatinine, Ser: 0.73 mg/dL (ref 0.44–1.00)
GFR calc Af Amer: >60 mL/min (ref >60)
GFR calc non Af Amer: >60 mL/min (ref >60)
GLUCOSE: 85 mg/dL (ref 65–99)
POTASSIUM: 3.4 mmol/L — AB (ref 3.5–5.1)
Sodium: 143 mmol/L (ref 135–145)

## 2015-05-19 MED ORDER — CARVEDILOL 12.5 MG PO TABS
12.5000 mg | ORAL_TABLET | Freq: Two times a day (BID) | ORAL | Status: DC
  Administered 2015-05-19 – 2015-05-20 (×2): 12.5 mg via ORAL
  Filled 2015-05-19 (×2): qty 1

## 2015-05-19 MED ORDER — CARVEDILOL 12.5 MG PO TABS: 12.5000 mg | ORAL_TABLET | Freq: Two times a day (BID) | ORAL | Status: DC

## 2015-05-19 MED ORDER — LEVOTHYROXINE SODIUM 100 MCG PO TABS
100.0000 ug | ORAL_TABLET | Freq: Every day | ORAL | Status: DC
  Administered 2015-05-20: 100 ug via ORAL
  Filled 2015-05-19: qty 1

## 2015-05-19 MED ORDER — POTASSIUM CHLORIDE CRYS ER 20 MEQ PO TBCR
40.0000 meq | EXTENDED_RELEASE_TABLET | Freq: Once | ORAL | Status: AC
  Administered 2015-05-19: 40 meq via ORAL
  Filled 2015-05-19: qty 2

## 2015-05-19 MED ORDER — DIGOXIN 125 MCG PO TABS
0.2500 mg | ORAL_TABLET | Freq: Every day | ORAL | Status: DC
  Administered 2015-05-19 – 2015-05-20 (×2): 0.25 mg via ORAL
  Filled 2015-05-19 (×2): qty 2

## 2015-05-19 MED ORDER — LOSARTAN POTASSIUM 50 MG PO TABS
100.0000 mg | ORAL_TABLET | Freq: Every day | ORAL | Status: DC
  Administered 2015-05-19 – 2015-05-20 (×2): 100 mg via ORAL
  Filled 2015-05-19 (×2): qty 2

## 2015-05-19 MED ORDER — DICLOFENAC SODIUM 1 % TD GEL
2.0000 g | Freq: Four times a day (QID) | TRANSDERMAL | Status: DC | PRN
  Administered 2015-05-19: 2 g via TOPICAL
  Filled 2015-05-19: qty 100

## 2015-05-19 NOTE — Progress Notes (Addendum)
[facility] PROGRESS NOTE  [name] [id number] DOB: July 03, 1928 DOA: 05/16/2015 PCP: [place], BETTY G, MD  Assessment/Plan: 1. Small bowel obstruction.  -[name] is a pleasant 82 year old female having a history of undergoing multiple abdominal surgeries including bowel resections as well as adhesional small bowel obstructions. -She presents with complaints of nausea and vomiting as CT scan of abdomen and pelvis revealed small bowel obstruction -She was seen and evaluated by general surgery. -She was made nothing by mouth with placement of NG tube. IV fluids running. -Evaluation she reports improvement to abdominal pain. Passing flatus -On 05/18/2015 she reports feeling a little better, passing flatus. Having positive bowel sounds on exam.  -By 05/19/2015 her diet was advanced to clears.   2.  Hypothyroidism. -Patient having a history of hypothyroidism had been on Synthroid 100 g by mouth daily -NG tube placed as she is nothing by mouth for small bowel obstruction.  -Will restart synthroid 100 mg PO q daily and stop IV Synthroid.  3.  History of hypertension. -Will restart her Coreg 12.5 mg PO BID and Cozaar 100 mg PO q daily   4.  Leukocytosis. -Like secondary to small bowel obstruction  5.  History of diastolic congestive heart failure -Last transthoracic echocardiogram performed in 2009 showing ejection fraction of 123456 with diastolic dysfunction. -Diuretics held since she is currently nothing by mouth. Providing gentle IV fluid hydration with normal saline running at 75 mL/per hour -On 05/19/2015 discontinued IV fluids as diet was advanced.   Code Status: Full Code Family Communication:  Disposition Plan: Showing clinical improvement, anticipate discharge in the next 24 hours.     Consultants:  Gen. surgery  Procedures:  NG tube placement  HPI/Subjective: [name] is a pleasant 82 year old female with a past medical history of hypertension, history of  small bowel obstruction admitted to medical service on 05/17/2015 she presented with complaints of nausea and vomiting. She has a history of undergoing multiple abdominal surgeries including bowel resections. She was further worked up with a CT scan of abdomen and pelvis that revealed the presence of a small bowel obstruction with transition zone in the left anterior lower abdominal region. Small bowel obstruction likely secondary to peritoneal adhesions. Gen. surgery was consulted. NG tube was placed.  Objective: Filed Vitals:   05/19/15 0600 05/19/15 1356  BP: 133/79 196/76  Pulse: 65 68  Temp: 98.1 F (36.7 C) 99.3 F (37.4 C)  Resp: 18 20    Intake/Output Summary (Last 24 hours) at 05/19/15 1417 Last data filed at 05/19/15 0600  Gross per 24 hour  Intake 1803.75 ml  Output   1700 ml  Net 103.75 ml   Filed Weights   05/17/15 1208 05/19/15 0600  Weight: 58.968 kg (130 lb) 58.605 kg (129 lb 3.2 oz)    Exam:   General:  Looks better she ambulated down the hallway  Cardiovascular: Regular rate and rhythm normal S1-S2, no extremity edema  Respiratory: Normal respiratory effort  Abdomen: Improved distention,has positive bowel sounds, mild generalized tenderness to palpation  Musculoskeletal: No edema  Data Reviewed: Basic Metabolic Panel:  Recent Labs Lab 05/17/15 0020 05/18/15 0600 05/19/15 0455  NA 133* 138 143  K 4.6 4.0 3.4*  CL 98* 98* 105  CO2  --  33* 25  GLUCOSE 144* 105* 85  BUN 24* 25* 28*  CREATININE 1.00 0.77 0.73  CALCIUM  --  8.5* 8.6*   Liver Function Tests:  Recent Labs Lab 05/17/15 0008  AST 17  ALT 12*  ALKPHOS 67  BILITOT 0.7  PROT 7.5  ALBUMIN 3.9    Recent Labs Lab 05/17/15 0008  LIPASE 21   No results for input(s): AMMONIA in the last 168 hours. CBC:  Recent Labs Lab 05/17/15 0008 05/17/15 0020 05/18/15 0600 05/19/15 0625  WBC 15.7*  --  7.6 7.5  NEUTROABS 13.3*  --   --   --   HGB 14.7 15.6* 12.5 12.5  HCT 43.0  46.0 37.4 38.4  MCV 89.2  --  91.7 93.4  PLT 194  --  171 182   Cardiac Enzymes: No results for input(s): CKTOTAL, CKMB, CKMBINDEX, TROPONINI in the last 168 hours. BNP (last 3 results) No results for input(s): BNP in the last 8760 hours.  ProBNP (last 3 results) No results for input(s): PROBNP in the last 8760 hours.  CBG: No results for input(s): GLUCAP in the last 168 hours.  No results found for this or any previous visit (from the past 240 hour(s)).   Studies: Dg Abd 2 Views  05/18/2015  CLINICAL DATA:  Abdominal pain for the past 2 days. EXAM: ABDOMEN - 2 VIEW COMPARISON:  Yesterday. FINDINGS: Nasogastric tube tip in the region of the gastric pylorus or duodenal bulb. Gas and stool in normal caliber small bowel and colon. Right abdominal and pelvic surgical clips. Pelvic surgical staples. Atheromatous arterial calcifications. Lumbar and lower thoracic spine degenerative changes. Lumbar scoliosis. IMPRESSION: No acute abnormality. Electronically Signed   By: Claudie Revering M.D.   On: 05/18/2015 11:06    Scheduled Meds: . brimonidine  1 drop Both Eyes BID  . enoxaparin (LOVENOX) injection  40 mg Subcutaneous Q24H  . latanoprost  1 drop Both Eyes QHS  . levothyroxine  50 mcg Intravenous Daily  . lidocaine  1 patch Transdermal Daily  . sodium chloride flush  3 mL Intravenous Q12H  . timolol  1 drop Both Eyes BID   Continuous Infusions: . sodium chloride 75 mL/hr at 05/19/15 [id number]    Principal Problem:   Small bowel obstruction [facility]) Active Problems:   Biventricular ICD Medtronic Concerto implanted 123XX123   Hypothyroidism   Essential hypertension   Hypocalcemia   Leukocytosis    Time spent: 25 min    Kelvin Cellar  [facility] Pager [phone]. If 7PM-7AM, please contact night-coverage at [email or web address], password [facility] 05/19/2015, 2:17 PM  LOS: 2 days

## 2015-05-19 NOTE — Progress Notes (Signed)
Patient ID: [name], female   DOB: [birth year], 82 y.o.   MRN: [id number]     [address] [place], [place] [address]    Phone: [phone] FAX: [phone]     Subjective: No n/v.  Passing flatus.  No BM. 1356m ngt output. ngt is bothersome.   Objective:  Vital signs:  Filed Vitals:   05/18/15 1401 05/18/15 2224 05/18/15 2300 05/19/15 0600  BP: 163/80  159/66 133/79  Pulse: 62 66  65  Temp: 97.7 F (36.5 C) 98.1 F (36.7 C)  98.1 F (36.7 C)  TempSrc: Oral Oral  Oral  Resp: '18 18  18  '$ Height:      Weight:    58.605 kg (129 lb 3.2 oz)  SpO2: 95% 94%  96%    Last BM Date: 05/14/15  Intake/Output   Yesterday:  02/19 0701 - 02/20 0700 In: 1803.8 [I.V.:1803.8] Out: 1700 [Urine:350; Emesis/NG output:1350] This shift:     Physical Exam: General: Pt awake/alert/oriented x4 in no acute distress  Abdomen: Soft.  Nondistended.  Non tender.  No evidence of peritonitis.  No incarcerated hernias.    Problem List:   Principal Problem:   [place] Active Problems:   Biventricular ICD Medtronic Concerto implanted [id number]  Hypothyroidism   Essential hypertension   Hypocalcemia   Leukocytosis    Results:   Labs: Results for orders placed or performed during the hospital encounter of 05/16/15 (from the past 48 hour(s))  Urinalysis, [facility]     Status: Abnormal   Collection Time: 05/17/15  3:30 PM  Result Value Ref Range   Color, Urine AMBER (A) YELLOW    Comment: BIOCHEMICALS MAY BE AFFECTED BY COLOR   APPearance CLEAR CLEAR   Specific Gravity, Urine >1.046 (H) 1.005 - 1.030   pH 5.5 5.0 - 8.0   Glucose, UA NEGATIVE NEGATIVE mg/dL   Hgb urine dipstick NEGATIVE NEGATIVE   Bilirubin Urine SMALL (A) NEGATIVE   Ketones, ur NEGATIVE NEGATIVE mg/dL   Protein, ur 30 (A) NEGATIVE mg/dL   Nitrite NEGATIVE NEGATIVE   Leukocytes, UA NEGATIVE  NEGATIVE  Urine microscopic-add on     Status: Abnormal   Collection Time: 05/17/15  3:30 PM  Result Value Ref Range   Squamous Epithelial / LPF 0-5 (A) NONE SEEN   WBC, UA 0-5 0 - 5 WBC/hpf   RBC / HPF 0-5 0 - 5 RBC/hpf   Bacteria, UA NONE SEEN NONE SEEN  Basic metabolic panel     Status: Abnormal   Collection Time: 05/18/15  6:00 AM  Result Value Ref Range   Sodium 138 135 - 145 mmol/L   Potassium 4.0 3.5 - 5.1 mmol/L   Chloride 98 (L) 101 - 111 mmol/L   CO2 33 (H) 22 - 32 mmol/L   Glucose, Bld 105 (H) 65 - 99 mg/dL   BUN 25 (H) 6 - 20 mg/dL   Creatinine, Ser 0.77 0.44 - 1.00 mg/dL   Calcium 8.5 (L) 8.9 - 10.3 mg/dL   GFR calc non Af Amer >60 >60 mL/min   GFR calc Af Amer >60 >60 mL/min    Comment: (NOTE) The eGFR has been calculated using the CKD EPI equation. This calculation has not been validated in all clinical situations. eGFR's persistently <60 mL/min signify possible Chronic Kidney Disease.    Anion gap 7 5 - 15  CBC     Status: None   Collection Time: 05/18/15  6:00 AM  Result Value Ref Range   WBC 7.6 4.0 - 10.5 K/uL   RBC 4.08 3.87 - 5.11 MIL/uL   Hemoglobin 12.5 12.0 - 15.0 g/dL   HCT 37.4 36.0 - 46.0 %   MCV 91.7 78.0 - 100.0 fL   MCH 30.6 26.0 - 34.0 pg   MCHC 33.4 30.0 - 36.0 g/dL   RDW 12.9 11.5 - 15.5 %   Platelets 171 150 - 400 K/uL  Basic metabolic panel     Status: Abnormal   Collection Time: 05/19/15  4:55 AM  Result Value Ref Range   Sodium 143 135 - 145 mmol/L   Potassium 3.4 (L) 3.5 - 5.1 mmol/L   Chloride 105 101 - 111 mmol/L   CO2 25 22 - 32 mmol/L   Glucose, Bld 85 65 - 99 mg/dL   BUN 28 (H) 6 - 20 mg/dL   Creatinine, Ser 0.73 0.44 - 1.00 mg/dL   Calcium 8.6 (L) 8.9 - 10.3 mg/dL   GFR calc non Af Amer >60 >60 mL/min   GFR calc Af Amer >60 >60 mL/min    Comment: (NOTE) The eGFR has been calculated using the CKD EPI equation. This calculation has not been validated in all clinical situations. eGFR's persistently <60 mL/min signify  possible Chronic Kidney Disease.    Anion gap 13 5 - 15  CBC     Status: None   Collection Time: 05/19/15  6:25 AM  Result Value Ref Range   WBC 7.5 4.0 - 10.5 K/uL   RBC 4.11 3.87 - 5.11 MIL/uL   Hemoglobin 12.5 12.0 - 15.0 g/dL   HCT 38.4 36.0 - 46.0 %   MCV 93.4 78.0 - 100.0 fL   MCH 30.4 26.0 - 34.0 pg   MCHC 32.6 30.0 - 36.0 g/dL   RDW 12.9 11.5 - 15.5 %   Platelets 182 150 - 400 K/uL    Imaging / Studies: Dg Abd 2 Views  05/18/2015  CLINICAL DATA:  Abdominal pain for the past 2 days. EXAM: ABDOMEN - 2 VIEW COMPARISON:  Yesterday. FINDINGS: Nasogastric tube tip in the region of the gastric pylorus or duodenal bulb. Gas and stool in normal caliber small bowel and colon. Right abdominal and pelvic surgical clips. Pelvic surgical staples. Atheromatous arterial calcifications. Lumbar and lower thoracic spine degenerative changes. Lumbar scoliosis. IMPRESSION: No acute abnormality. Electronically Signed   By: Claudie Revering M.D.   On: 05/18/2015 11:06    Medications / Allergies:  Scheduled Meds: . brimonidine  1 drop Both Eyes BID  . enoxaparin (LOVENOX) injection  40 mg Subcutaneous Q24H  . latanoprost  1 drop Both Eyes QHS  . levothyroxine  50 mcg Intravenous Daily  . lidocaine  1 patch Transdermal Daily  . sodium chloride flush  3 mL Intravenous Q12H  . timolol  1 drop Both Eyes BID   Continuous Infusions: . sodium chloride 75 mL/hr at 05/18/15 1940   PRN Meds:.acetaminophen **OR** acetaminophen, hydrALAZINE, levalbuterol, menthol-cetylpyridinium, metoprolol, morphine injection, ondansetron **OR** ondansetron (ZOFRAN) IV, phenol  Antibiotics: Anti-infectives    None        Assessment/Plan: Incisional hernia Recurrent pSBO - adhesive disease probably Resolved on AXR 2/19.  Flatus.  No BM.  NGT still with 1338m/24h.  Unsure whether the NGT was pulled back yesterday.  Radiologically and clinically resolved, clamp NGT and give clears.   EErby Pian [facility]  Pager [phone](7A-4:30P)   05/19/2015 9:17 AM

## 2015-05-20 LAB — CBC
HCT: 33.8 % — ABNORMAL LOW (ref 36.0–46.0)
HEMOGLOBIN: 11.2 g/dL — AB (ref 12.0–15.0)
MCH: 30.8 pg (ref 26.0–34.0)
MCHC: 33.1 g/dL (ref 30.0–36.0)
MCV: 92.9 fL (ref 78.0–100.0)
Platelets: 180 10*3/uL (ref 150–400)
RBC: 3.64 MIL/uL — AB (ref 3.87–5.11)
RDW: 13.1 % (ref 11.5–15.5)
WBC: 8.6 10*3/uL (ref 4.0–10.5)

## 2015-05-20 LAB — BASIC METABOLIC PANEL
ANION GAP: 7 (ref 5–15)
BUN: 18 mg/dL (ref 6–20)
CALCIUM: 8 mg/dL — AB (ref 8.9–10.3)
CO2: 26 mmol/L (ref 22–32)
Chloride: 101 mmol/L (ref 101–111)
Creatinine, Ser: 0.66 mg/dL (ref 0.44–1.00)
GLUCOSE: 112 mg/dL — AB (ref 65–99)
Potassium: 3.4 mmol/L — ABNORMAL LOW (ref 3.5–5.1)
Sodium: 134 mmol/L — ABNORMAL LOW (ref 135–145)

## 2015-05-20 MED ORDER — LORAZEPAM 0.5 MG PO TABS
0.5000 mg | ORAL_TABLET | Freq: Once | ORAL | Status: AC
  Administered 2015-05-20: 0.5 mg via ORAL
  Filled 2015-05-20: qty 1

## 2015-05-20 NOTE — Progress Notes (Signed)
Patient ID: [name], female   DOB: 11-28-1928, 82 y.o.   MRN: [id number]     [facility]      [address] [place], [place] [address]    Phone: [phone] FAX: [phone]     Subjective: No n/v.  Having BMs/flatus. No abdominal pain.  Tolerating clears.   Objective:  Vital signs:  Filed Vitals:   05/19/15 1356 05/19/15 1445 05/19/15 2044 05/20/15 0646  BP: 196/76 171/70 160/76 157/76  Pulse: 68 74 65 65  Temp: 99.3 F (37.4 C)  99.2 F (37.3 C) 98.3 F (36.8 C)  TempSrc: Oral  Oral Oral  Resp: '20  20 20  '$ Height:      Weight:    58.695 kg (129 lb 6.4 oz)  SpO2: 96%  98% 98%    Last BM Date: 05/14/15  Intake/Output   Yesterday:  02/20 0701 - 02/21 0700 In: 1596.3 [P.O.:940; I.V.:656.3] Out: -  This shift:    I/O last 3 completed shifts: In: 2721.3 [P.O.:940; I.V.:1781.3] Out: 800 [Urine:200; Emesis/NG output:600]    Physical Exam: General: Pt awake/alert/oriented x4 in no acute distress Abdomen: Soft.  Nondistended.  Non tender.  Multiple incisional hernias are soft and reducible.  No evidence of peritonitis.      Problem List:   Principal Problem:   Small bowel obstruction ([place]) Active Problems:   Biventricular ICD Medtronic Concerto implanted 7342   Hypothyroidism   Essential hypertension   Hypocalcemia   Leukocytosis    Results:   Labs: Results for orders placed or performed during the hospital encounter of 05/16/15 (from the past 48 hour(s))  Basic metabolic panel     Status: Abnormal   Collection Time: 05/19/15  4:55 AM  Result Value Ref Range   Sodium 143 135 - 145 mmol/L   Potassium 3.4 (L) 3.5 - 5.1 mmol/L   Chloride 105 101 - 111 mmol/L   CO2 25 22 - 32 mmol/L   Glucose, Bld 85 65 - 99 mg/dL   BUN 28 (H) 6 - 20 mg/dL   Creatinine, Ser 0.73 0.44 - 1.00 mg/dL   Calcium 8.6 (L) 8.9 - 10.3 mg/dL   GFR calc non Af Amer >60 >60 mL/min   GFR calc Af Amer >60 >60 mL/min    Comment:  (NOTE) The eGFR has been calculated using the CKD EPI equation. This calculation has not been validated in all clinical situations. eGFR's persistently <60 mL/min signify possible Chronic Kidney Disease.    Anion gap 13 5 - 15  CBC     Status: None   Collection Time: 05/19/15  6:25 AM  Result Value Ref Range   WBC 7.5 4.0 - 10.5 K/uL   RBC 4.11 3.87 - 5.11 MIL/uL   Hemoglobin 12.5 12.0 - 15.0 g/dL   HCT 38.4 36.0 - 46.0 %   MCV 93.4 78.0 - 100.0 fL   MCH 30.4 26.0 - 34.0 pg   MCHC 32.6 30.0 - 36.0 g/dL   RDW 12.9 11.5 - 15.5 %   Platelets 182 150 - 400 K/uL  Basic metabolic panel     Status: Abnormal   Collection Time: 05/20/15  5:14 AM  Result Value Ref Range   Sodium 134 (L) 135 - 145 mmol/L    Comment: DELTA CHECK NOTED REPEATED TO VERIFY    Potassium 3.4 (L) 3.5 - 5.1 mmol/L   Chloride 101 101 - 111 mmol/L   CO2 26 22 - 32  mmol/L   Glucose, Bld 112 (H) 65 - 99 mg/dL   BUN 18 6 - 20 mg/dL   Creatinine, Ser 0.66 0.44 - 1.00 mg/dL   Calcium 8.0 (L) 8.9 - 10.3 mg/dL   GFR calc non Af Amer >60 >60 mL/min   GFR calc Af Amer >60 >60 mL/min    Comment: (NOTE) The eGFR has been calculated using the CKD EPI equation. This calculation has not been validated in all clinical situations. eGFR's persistently <60 mL/min signify possible Chronic Kidney Disease.    Anion gap 7 5 - 15  CBC     Status: Abnormal   Collection Time: 05/20/15  5:14 AM  Result Value Ref Range   WBC 8.6 4.0 - 10.5 K/uL   RBC 3.64 (L) 3.87 - 5.11 MIL/uL   Hemoglobin 11.2 (L) 12.0 - 15.0 g/dL   HCT 33.8 (L) 36.0 - 46.0 %   MCV 92.9 78.0 - 100.0 fL   MCH 30.8 26.0 - 34.0 pg   MCHC 33.1 30.0 - 36.0 g/dL   RDW 13.1 11.5 - 15.5 %   Platelets 180 150 - 400 K/uL    Imaging / Studies: Dg Abd 2 Views  05/18/2015  CLINICAL DATA:  Abdominal pain for the past 2 days. EXAM: ABDOMEN - 2 VIEW COMPARISON:  Yesterday. FINDINGS: Nasogastric tube tip in the region of the gastric pylorus or duodenal bulb. Gas and  stool in normal caliber small bowel and colon. Right abdominal and pelvic surgical clips. Pelvic surgical staples. Atheromatous arterial calcifications. Lumbar and lower thoracic spine degenerative changes. Lumbar scoliosis. IMPRESSION: No acute abnormality. Electronically Signed   By: Claudie Revering M.D.   On: 05/18/2015 11:06    Medications / Allergies:  Scheduled Meds: . brimonidine  1 drop Both Eyes BID  . carvedilol  12.5 mg Oral BID WC  . digoxin  0.25 mg Oral Daily  . enoxaparin (LOVENOX) injection  40 mg Subcutaneous Q24H  . latanoprost  1 drop Both Eyes QHS  . levothyroxine  100 mcg Oral QAC breakfast  . lidocaine  1 patch Transdermal Daily  . losartan  100 mg Oral Daily  . sodium chloride flush  3 mL Intravenous Q12H  . timolol  1 drop Both Eyes BID   Continuous Infusions:  PRN Meds:.acetaminophen **OR** acetaminophen, diclofenac sodium, levalbuterol, menthol-cetylpyridinium, morphine injection, ondansetron **OR** ondansetron (ZOFRAN) IV, phenol  Antibiotics: Anti-infectives    None        Assessment/Plan: Incisional hernia-soft and reducbile Recurrent pSBO - adhesive disease probably Resolved, fulls, advance diet as tolerated.  Discharge per primary team.   Erby Pian, [facility] Surgery Pager [phone]([id number]-4:[phone])   05/20/2015 9:05 AM

## 2015-05-20 NOTE — Consult Note (Signed)
   [facility] Inpatient Consult   05/20/2015  RAYNETTA BUEHRER 1929/03/21 [id number]    Patient screened for [facility] services. Went to bedside to speak with patient to discuss and explain [facility] Care Management program. Ms Entler reports she has good family support and neighbors who assist her. She states she does not think she needs additional follow up from [facility]. Accepted [facility] Care Management brochure/disclosure/contact number to call in future if she changes her mind. Appreciative of visit. Will make inpatient RNCM aware that patient pleasantly declined [facility] Management program.   Marthenia Rolling, MSN-Ed, RN,BSN [facility] Liaison [phone]

## 2015-05-20 NOTE — Care Management Important Message (Signed)
Important Message  Patient Details  Name: YAFA BEINE MRN: [id number] Date of Birth: 01-17-1929   Medicare Important Message Given:  Yes    Camillo Flaming 05/20/2015, 1:13 [place] Message  Patient Details  Name: HARBOR HEMBREE MRN: [id number] Date of Birth: 1929/02/03   Medicare Important Message Given:  Yes    Camillo Flaming 05/20/2015, 1:12 PM

## 2015-05-20 NOTE — Progress Notes (Signed)
Spoke with pt concerning HH, pt declined.  Pt states that her granddaughter will be there with her. She will not need HH at present time.

## 2015-05-20 NOTE — Discharge Summary (Signed)
Physician Discharge Summary  [name] [id number] DOB: 1929-03-29 DOA: 05/16/2015  PCP: Martinique, BETTY G, MD  Admit date: 05/16/2015 Discharge date: 05/20/2015  Time spent: 35 minutes  Recommendations for Outpatient Follow-up:  1. Please follow up on blood pressures, during this admission her antihypertensive agents were held secondary to SBO. She restarted on her home regimen on discharge. 2. Home Health offered on discharge, she declined.    Discharge Diagnoses:  Principal Problem:   Small bowel obstruction ([place]) Active Problems:   Biventricular ICD Medtronic Concerto implanted 123XX123   Hypothyroidism   Essential hypertension   Hypocalcemia   Leukocytosis   Discharge Condition: Stable  Diet recommendation: Heart Healthy  Filed Weights   05/17/15 1208 05/19/15 0600 05/20/15 0646  Weight: 58.968 kg (130 lb) 58.605 kg (129 lb 3.2 oz) 58.695 kg (129 lb 6.4 oz)    History of present illness:  Ms. 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 Symptoms started acutely around 7 PM tonight. Patient notes vomiting up stomach contents. She notes vomiting multiple times prior to calling EMS. Denies trying anything to relieve symptoms. Denies having any fever, chills, abdominal pain, or recent sick contacts. Reports associated symptoms of constipation last bowel movement was approximately 2 days ago. Upon arrival patient was seen to have a CT scan which showed signs of a left anterior lower small bowel obstruction secondary to what appears to be peritoneal adhesions. Gen. surgery consulted and recommended a G-tube placement and they will follow. [facility] to admit.   Hospital Course:  Ms Fairbairn is a pleasant 82 year old female with a past medical history of hypertension, history of small bowel obstruction  admitted to medical service on 05/17/2015 she presented with complaints of nausea and vomiting. She has a history of undergoing multiple abdominal surgeries including bowel resections. She was further worked up with a CT scan of abdomen and pelvis that revealed the presence of a small bowel obstruction with transition zone in the left anterior lower abdominal region. Small bowel obstruction likely secondary to peritoneal adhesions. Gen. surgery was consulted. NG tube was placed.   Small bowel obstruction. -Mrs Jeng is a pleasant 82 year old female having a history of undergoing multiple abdominal surgeries including bowel resections as well as adhesional small bowel obstructions. -She presents with complaints of nausea and vomiting as CT scan of abdomen and pelvis revealed small bowel obstruction -She was seen and evaluated by general surgery. -She was made nothing by mouth with placement of NG tube. IV fluids running. -Evaluation she reports improvement to abdominal pain. Passing flatus -On 05/18/2015 she reports feeling a little better, passing flatus. Having positive bowel sounds on exam.  -By 05/19/2015 her diet was advanced to clears. Tolerated advancement of diet thereafter -She was discharged in stable condition on 05/20/2015  2. Hypothyroidism. -Patient having a history of hypothyroidism had been on Synthroid 100 g by mouth daily -NG tube placed as she is nothing by mouth for small bowel obstruction.  -Will discharge synthroid 100 mg PO q daily and stop IV Synthroid.  3. History of hypertension. -Restart her Coreg 12.5 mg PO BID and Cozaar 100 mg PO q daily  -Please follow up on blood pressures on hospital follow up visit  4. Leukocytosis. -Like secondary to small bowel obstruction  5. History of diastolic congestive heart failure -Last transthoracic echocardiogram performed in  2009 showing ejection fraction of 123456 with diastolic dysfunction. -Diuretics held since she is  currently nothing by mouth. Providing gentle IV fluid hydration with normal saline running at 75 mL/per hour -On 05/19/2015 discontinued IV fluids as diet was advanced -On discharge placed back on her home regimen of lasix  Procedures:  NG tube placement  Consultations:  General surgery  Discharge Exam: Filed Vitals:   05/19/15 2044 05/20/15 0646  BP: 160/76 157/76  Pulse: 65 65  Temp: 99.2 F (37.3 C) 98.3 F (36.8 C)  Resp: 20 20     General: Looks better she ambulated down the hallway  Cardiovascular: Regular rate and rhythm normal S1-S2, no extremity edema  Respiratory: Normal respiratory effort  Abdomen: Improved distention,has positive bowel sounds, mild generalized tenderness to palpation  Musculoskeletal: No edema  Discharge Instructions   Discharge Instructions    Call MD for:  difficulty breathing, headache or visual disturbances    Complete by:  As directed      Call MD for:  extreme fatigue    Complete by:  As directed      Call MD for:  hives    Complete by:  As directed      Call MD for:  persistant dizziness or light-headedness    Complete by:  As directed      Call MD for:  persistant nausea and vomiting    Complete by:  As directed      Call MD for:  redness, tenderness, or signs of infection (pain, swelling, redness, odor or green/yellow discharge around incision site)    Complete by:  As directed      Call MD for:  severe uncontrolled pain    Complete by:  As directed      Call MD for:  temperature >100.4    Complete by:  As directed      Call MD for:    Complete by:  As directed      Diet - low sodium heart healthy    Complete by:  As directed      Increase activity slowly    Complete by:  As directed           Current Discharge Medication List    CONTINUE these medications which have NOT CHANGED   Details  calcium-vitamin D (OSCAL WITH D) 250-125 MG-UNIT per tablet Take 1 tablet by mouth daily.      carvedilol (COREG) 12.5 MG  tablet Take 1 tablet (12.5 mg total) by mouth 2 (two) times daily with a meal. Qty: 180 tablet, Refills: 1    co-enzyme Q-10 50 MG capsule Take 50 mg by mouth daily.      COMBIGAN 0.2-0.5 % ophthalmic solution Place 1 drop into both eyes 2 (two) times daily.    digoxin (LANOXIN) 0.25 MG tablet TAKE 1 TABLET DAILY Qty: 90 tablet, Refills: 1    furosemide (LASIX) 20 MG tablet TAKE ONE-HALF (1/2) TABLET DAILY Qty: 45 tablet, Refills: 6    latanoprost (XALATAN) 0.005 % ophthalmic solution Place 1 drop into both eyes at bedtime. Refills: 6    losartan (COZAAR) 100 MG tablet TAKE 1 TABLET DAILY Qty: 90 tablet, Refills: 1    Omega-3 Fatty Acids (FISH OIL PO) Take 1 capsule by mouth daily.    oxyCODONE (ROXICODONE) 15 MG immediate release tablet Take 15 mg by mouth 5 (five) times daily as needed for pain.  Refills: 0    SYNTHROID 100 MCG tablet Take 100 mcg by  mouth daily.    vitamin C (ASCORBIC ACID) 500 MG tablet Take 500 mg by mouth daily.        No Known Allergies Follow-up Information    Follow up with Martinique, Malka So, MD In 2 weeks.   Specialty:  Family Medicine   Contact information:   [place] [address] [phone]       Follow up with [facility] In 10 days.   Specialty:  General Surgery   Contact information:   [place] [address] [phone]        The results of significant diagnostics from this hospitalization (including imaging, microbiology, ancillary and laboratory) are listed below for reference.    Significant Diagnostic Studies: Dg Chest 2 View  05/17/2015  CLINICAL DATA:  82 year old female with nausea and diaphoresis. History of ovarian cancer. EXAM: CHEST  2 VIEW COMPARISON:  Radiograph dated 03/28/2015 FINDINGS: Two views of the chest do not demonstrate a focal consolidation. There is no pleural effusion or pneumothorax. There is mild eventration of the left hemidiaphragm. The  cardiac silhouette is within normal limits. Left pectoral AICD device noted. There is osteopenia with degenerative changes of the spine. No acute fracture. IMPRESSION: No active cardiopulmonary disease. Electronically Signed   By: Anner Crete M.D.   On: 05/17/2015 00:11   Dg Abd 1 View  05/17/2015  CLINICAL DATA:  NGT placement EXAM: ABDOMEN - 1 VIEW COMPARISON:  03/29/2015 FINDINGS: Enteric tube with tip in the right mid abdomen consistent with location in the distal stomach. Scattered gas and stool throughout the colon. No small or large bowel distention. Residual contrast material is demonstrated in the bladder and in the right renal collecting system. There is dilatation of the right renal collecting system suggesting UPJ obstruction. Degenerative changes in the spine. IMPRESSION: Enteric tube tip is in the right mid abdomen consistent with location in the distal stomach. Residual contrast material within dilated right renal collecting system suggesting UPJ obstruction. Electronically Signed   By: Lucienne Capers M.D.   On: 05/17/2015 03:22   Ct Abdomen Pelvis W Contrast  05/17/2015  CLINICAL DATA:  82 year old female with abdominal pain, nausea vomiting. EXAM: CT ABDOMEN AND PELVIS WITH CONTRAST TECHNIQUE: Multidetector CT imaging of the abdomen and pelvis was performed using the standard protocol following bolus administration of intravenous contrast. CONTRAST:  52mL OMNIPAQUE IOHEXOL 300 MG/ML SOLN, 144mL OMNIPAQUE IOHEXOL 300 MG/ML SOLN COMPARISON:  CT dated 03/28/2015 FINDINGS: The visualized lung bases are clear. Mild cardiomegaly. Cardiac pacemaker wires noted. No intra-abdominal free air. Small free fluid within the pelvis. Cholecystectomy. Mild biliary ductal dilatation, likely post cholecystectomy. The liver appears unremarkable. The pancreas is atrophic. The spleen appears unremarkable. There is left adrenal thickening/ hyperplasia. There is an indeterminate stable 1.5 cm right adrenal  nodule. There is moderate left renal atrophy. There is no hydronephrosis on the left. There is an extrarenal pelvis on the left with mild fullness of the renal pelvis similar to prior study. The visualized ureters and urinary bladder appear unremarkable. Hysterectomy. There is postsurgical changes of bowel resection with multiple anastomotic sutures. Multiple mildly dilated loops of proximal and mid small bowel noted. The distal small bowel demonstrate a normal caliber. A transition zone is noted in the left anterior lower abdomen (series 2 image 37) where there is abutment of bowel to the anterior peritoneal wall and adhesion. There is mild thickening of the bowel at this site. There is  a broad-based ventral hernia along the midline vertical anterior pelvic wall incisional scar. There is abutment of loops of small bowel to the anterior peritoneal wall compatible with adhesions. A left periumbilical hernia defect containing a short segment of small bowel noted. There is no evidence of bowel obstruction at this site. There is aortoiliac atherosclerotic disease. The abdominal aorta is tortuous and ectatic. No portal venous gas identified. There is no adenopathy. There is osteopenia with scoliosis and extensive degenerative changes of the spine. 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 No acute fracture. IMPRESSION: Small-bowel obstruction with transition zone in the left anterior lower abdomen secondary to peritoneal adhesions. Other nonacute findings as seen on the prior exam and described above. Electronically Signed   By: Anner Crete M.D.   On: 05/17/2015 02:02   Dg Abd 2 Views  05/18/2015  CLINICAL DATA:  Abdominal pain for the past 2 days. EXAM: ABDOMEN - 2 VIEW COMPARISON:  Yesterday. FINDINGS: Nasogastric tube tip in the region of the gastric pylorus or duodenal bulb. Gas and stool in normal caliber small bowel and colon. Right abdominal and pelvic surgical clips. Pelvic surgical staples.  Atheromatous arterial calcifications. Lumbar and lower thoracic spine degenerative changes. Lumbar scoliosis. IMPRESSION: No acute abnormality. Electronically Signed   By: Claudie Revering M.D.   On: 05/18/2015 11:06    Microbiology: No results found for this or any previous visit (from the past 240 hour(s)).   Labs: Basic Metabolic Panel:  Recent Labs Lab 05/17/15 0020 05/18/15 0600 05/19/15 0455 05/20/15 0514  NA 133* 138 143 134*  K 4.6 4.0 3.4* 3.4*  CL 98* 98* 105 101  CO2  --  33* 25 26  GLUCOSE 144* 105* 85 112*  BUN 24* 25* 28* 18  CREATININE 1.00 0.77 0.73 0.66  CALCIUM  --  8.5* 8.6* 8.0*   Liver Function Tests:  Recent Labs Lab 05/17/15 0008  AST 17  ALT 12*  ALKPHOS 67  BILITOT 0.7  PROT 7.5  ALBUMIN 3.9    Recent Labs Lab 05/17/15 0008  LIPASE 21   No results for input(s): AMMONIA in the last 168 hours. CBC:  Recent Labs Lab 05/17/15 0008 05/17/15 0020 05/18/15 0600 05/19/15 0625 05/20/15 0514  WBC 15.7*  --  7.6 7.5 8.6  NEUTROABS 13.3*  --   --   --   --   HGB 14.7 15.6* 12.5 12.5 11.2*  HCT 43.0 46.0 37.4 38.4 33.8*  MCV 89.2  --  91.7 93.4 92.9  PLT 194  --  171 182 180   Cardiac Enzymes: No results for input(s): CKTOTAL, CKMB, CKMBINDEX, TROPONINI in the last 168 hours. BNP: BNP (last 3 results) No results for input(s): BNP in the last 8760 hours.  ProBNP (last 3 results) No results for input(s): PROBNP in the last 8760 hours.  CBG: No results for input(s): GLUCAP in the last 168 hours.     Signed:  Kelvin Cellar MD.  [facility] 05/20/2015, 12:55 PM

## 2015-05-29 ENCOUNTER — Encounter: Payer: Self-pay | Admitting: Cardiovascular Disease

## 2015-05-30 ENCOUNTER — Other Ambulatory Visit: Payer: Self-pay | Admitting: Cardiovascular Disease

## 2015-05-30 NOTE — Telephone Encounter (Signed)
Rx request sent to pharmacy.  

## 2015-06-02 DIAGNOSIS — E876 Hypokalemia: Secondary | ICD-10-CM | POA: Diagnosis not present

## 2015-06-02 DIAGNOSIS — G894 Chronic pain syndrome: Secondary | ICD-10-CM | POA: Diagnosis not present

## 2015-06-02 DIAGNOSIS — R2681 Unsteadiness on feet: Secondary | ICD-10-CM | POA: Diagnosis not present

## 2015-06-02 DIAGNOSIS — K5669 Other intestinal obstruction: Secondary | ICD-10-CM | POA: Diagnosis not present

## 2015-06-06 ENCOUNTER — Other Ambulatory Visit: Payer: Self-pay | Admitting: *Deleted

## 2015-06-06 MED ORDER — CARVEDILOL 12.5 MG PO TABS: 12.5000 mg | ORAL_TABLET | Freq: Two times a day (BID) | ORAL | Status: DC

## 2015-06-06 NOTE — Telephone Encounter (Signed)
Rx request sent to pharmacy.  

## 2015-06-30 DIAGNOSIS — Z23 Encounter for immunization: Secondary | ICD-10-CM | POA: Diagnosis not present

## 2015-06-30 DIAGNOSIS — J45909 Unspecified asthma, uncomplicated: Secondary | ICD-10-CM | POA: Diagnosis not present

## 2015-06-30 DIAGNOSIS — M810 Age-related osteoporosis without current pathological fracture: Secondary | ICD-10-CM | POA: Diagnosis not present

## 2015-06-30 DIAGNOSIS — G894 Chronic pain syndrome: Secondary | ICD-10-CM | POA: Diagnosis not present

## 2015-06-30 DIAGNOSIS — E039 Hypothyroidism, unspecified: Secondary | ICD-10-CM | POA: Diagnosis not present

## 2015-07-23 ENCOUNTER — Ambulatory Visit (INDEPENDENT_AMBULATORY_CARE_PROVIDER_SITE_OTHER): Payer: Medicare Other | Admitting: *Deleted

## 2015-07-23 DIAGNOSIS — I429 Cardiomyopathy, unspecified: Secondary | ICD-10-CM

## 2015-07-23 DIAGNOSIS — I428 Other cardiomyopathies: Secondary | ICD-10-CM

## 2015-07-23 DIAGNOSIS — I5042 Chronic combined systolic (congestive) and diastolic (congestive) heart failure: Secondary | ICD-10-CM

## 2015-07-23 NOTE — Progress Notes (Signed)
Remote ICD transmission.   

## 2015-08-14 DIAGNOSIS — G894 Chronic pain syndrome: Secondary | ICD-10-CM | POA: Diagnosis not present

## 2015-08-14 DIAGNOSIS — Z23 Encounter for immunization: Secondary | ICD-10-CM | POA: Diagnosis not present

## 2015-08-14 DIAGNOSIS — E039 Hypothyroidism, unspecified: Secondary | ICD-10-CM | POA: Diagnosis not present

## 2015-09-03 ENCOUNTER — Encounter: Payer: Self-pay | Admitting: Cardiology

## 2015-09-03 LAB — CUP PACEART REMOTE DEVICE CHECK
Brady Statistic AP VP Percent: 85.01 %
Brady Statistic AP VS Percent: 0.39 %
Brady Statistic AS VP Percent: 14.41 %
Brady Statistic RA Percent Paced: 85.4 %
Brady Statistic RV Percent Paced: 99.22 %
HIGH POWER IMPEDANCE MEASURED VALUE: 53 Ohm
HighPow Impedance: 43 Ohm
Implantable Lead Implant Date: 20090331
Implantable Lead Implant Date: 20090331
Implantable Lead Implant Date: 20090401
Implantable Lead Location: 753858
Implantable Lead Location: 753860
Implantable Lead Model: 5076
Lead Channel Impedance Value: 342 Ohm
Lead Channel Impedance Value: 418 Ohm
Lead Channel Impedance Value: 456 Ohm
Lead Channel Impedance Value: 589 Ohm
Lead Channel Pacing Threshold Amplitude: 0.75 V
Lead Channel Pacing Threshold Pulse Width: 0.4 ms
Lead Channel Pacing Threshold Pulse Width: 0.4 ms
Lead Channel Sensing Intrinsic Amplitude: 1.5 mV
Lead Channel Sensing Intrinsic Amplitude: 20.375 mV
Lead Channel Setting Pacing Amplitude: 2 V
Lead Channel Setting Pacing Amplitude: 3.25 V
Lead Channel Setting Pacing Pulse Width: 0.4 ms
Lead Channel Setting Sensing Sensitivity: 0.3 mV
MDC IDC LEAD LOCATION: 753859
MDC IDC LEAD MODEL: 4194
MDC IDC MSMT BATTERY REMAINING LONGEVITY: 44 mo
MDC IDC MSMT BATTERY VOLTAGE: 2.96 V
MDC IDC MSMT LEADCHNL LV PACING THRESHOLD AMPLITUDE: 1.375 V
MDC IDC MSMT LEADCHNL RA IMPEDANCE VALUE: 513 Ohm
MDC IDC MSMT LEADCHNL RV IMPEDANCE VALUE: 551 Ohm
MDC IDC MSMT LEADCHNL RV PACING THRESHOLD AMPLITUDE: 1.625 V
MDC IDC MSMT LEADCHNL RV PACING THRESHOLD PULSEWIDTH: 0.4 ms
MDC IDC SESS DTM: 20170426073425
MDC IDC SET LEADCHNL LV PACING AMPLITUDE: 2.5 V
MDC IDC SET LEADCHNL LV PACING PULSEWIDTH: 0.4 ms
MDC IDC STAT BRADY AS VS PERCENT: 0.19 %

## 2015-09-29 ENCOUNTER — Encounter (HOSPITAL_COMMUNITY): Payer: Self-pay | Admitting: Emergency Medicine

## 2015-09-29 ENCOUNTER — Emergency Department (HOSPITAL_COMMUNITY): Payer: Medicare Other

## 2015-09-29 ENCOUNTER — Inpatient Hospital Stay (HOSPITAL_COMMUNITY): Payer: Medicare Other

## 2015-09-29 ENCOUNTER — Inpatient Hospital Stay (HOSPITAL_COMMUNITY)
Admission: EM | Admit: 2015-09-29 | Discharge: 2015-10-01 | DRG: 640 | Disposition: A | Discharge status: Home / Self Care | Payer: Medicare Other | Attending: Internal Medicine | Admitting: Internal Medicine

## 2015-09-29 DIAGNOSIS — I10 Essential (primary) hypertension: Secondary | ICD-10-CM | POA: Diagnosis not present

## 2015-09-29 DIAGNOSIS — E039 Hypothyroidism, unspecified: Secondary | ICD-10-CM | POA: Diagnosis present

## 2015-09-29 DIAGNOSIS — I351 Nonrheumatic aortic (valve) insufficiency: Secondary | ICD-10-CM | POA: Diagnosis present

## 2015-09-29 DIAGNOSIS — R740 Nonspecific elevation of levels of transaminase and lactic acid dehydrogenase [LDH]: Secondary | ICD-10-CM | POA: Diagnosis present

## 2015-09-29 DIAGNOSIS — N1330 Unspecified hydronephrosis: Secondary | ICD-10-CM | POA: Diagnosis present

## 2015-09-29 DIAGNOSIS — I420 Dilated cardiomyopathy: Secondary | ICD-10-CM | POA: Diagnosis present

## 2015-09-29 DIAGNOSIS — E871 Hypo-osmolality and hyponatremia: Secondary | ICD-10-CM | POA: Diagnosis not present

## 2015-09-29 DIAGNOSIS — D638 Anemia in other chronic diseases classified elsewhere: Secondary | ICD-10-CM | POA: Diagnosis present

## 2015-09-29 DIAGNOSIS — I110 Hypertensive heart disease with heart failure: Secondary | ICD-10-CM | POA: Diagnosis present

## 2015-09-29 DIAGNOSIS — I639 Cerebral infarction, unspecified: Secondary | ICD-10-CM

## 2015-09-29 DIAGNOSIS — R4701 Aphasia: Secondary | ICD-10-CM | POA: Diagnosis present

## 2015-09-29 DIAGNOSIS — R945 Abnormal results of liver function studies: Secondary | ICD-10-CM | POA: Diagnosis present

## 2015-09-29 DIAGNOSIS — Z79899 Other long term (current) drug therapy: Secondary | ICD-10-CM | POA: Diagnosis not present

## 2015-09-29 DIAGNOSIS — R471 Dysarthria and anarthria: Secondary | ICD-10-CM | POA: Diagnosis not present

## 2015-09-29 DIAGNOSIS — F329 Major depressive disorder, single episode, unspecified: Secondary | ICD-10-CM | POA: Diagnosis present

## 2015-09-29 DIAGNOSIS — E860 Dehydration: Principal | ICD-10-CM | POA: Diagnosis present

## 2015-09-29 DIAGNOSIS — Z95810 Presence of automatic (implantable) cardiac defibrillator: Secondary | ICD-10-CM

## 2015-09-29 DIAGNOSIS — R634 Abnormal weight loss: Secondary | ICD-10-CM | POA: Diagnosis present

## 2015-09-29 DIAGNOSIS — G9340 Encephalopathy, unspecified: Secondary | ICD-10-CM | POA: Insufficient documentation

## 2015-09-29 DIAGNOSIS — K529 Noninfective gastroenteritis and colitis, unspecified: Secondary | ICD-10-CM | POA: Diagnosis present

## 2015-09-29 DIAGNOSIS — K7689 Other specified diseases of liver: Secondary | ICD-10-CM

## 2015-09-29 DIAGNOSIS — I454 Nonspecific intraventricular block: Secondary | ICD-10-CM | POA: Diagnosis present

## 2015-09-29 DIAGNOSIS — G9341 Metabolic encephalopathy: Secondary | ICD-10-CM | POA: Diagnosis present

## 2015-09-29 DIAGNOSIS — G459 Transient cerebral ischemic attack, unspecified: Secondary | ICD-10-CM | POA: Diagnosis not present

## 2015-09-29 DIAGNOSIS — G629 Polyneuropathy, unspecified: Secondary | ICD-10-CM | POA: Diagnosis present

## 2015-09-29 DIAGNOSIS — N179 Acute kidney failure, unspecified: Secondary | ICD-10-CM | POA: Diagnosis not present

## 2015-09-29 DIAGNOSIS — K838 Other specified diseases of biliary tract: Secondary | ICD-10-CM | POA: Diagnosis not present

## 2015-09-29 DIAGNOSIS — I5032 Chronic diastolic (congestive) heart failure: Secondary | ICD-10-CM | POA: Diagnosis present

## 2015-09-29 DIAGNOSIS — Z8543 Personal history of malignant neoplasm of ovary: Secondary | ICD-10-CM | POA: Diagnosis not present

## 2015-09-29 DIAGNOSIS — R8299 Other abnormal findings in urine: Secondary | ICD-10-CM | POA: Diagnosis not present

## 2015-09-29 DIAGNOSIS — Z7983 Long term (current) use of bisphosphonates: Secondary | ICD-10-CM

## 2015-09-29 DIAGNOSIS — T502X5A Adverse effect of carbonic-anhydrase inhibitors, benzothiadiazides and other diuretics, initial encounter: Secondary | ICD-10-CM | POA: Diagnosis present

## 2015-09-29 LAB — I-STAT CHEM 8, ED
BUN: 44 mg/dL — ABNORMAL HIGH (ref 6–20)
Calcium, Ion: 1.15 mmol/L (ref 1.12–1.23)
Chloride: 96 mmol/L — ABNORMAL LOW (ref 101–111)
Creatinine, Ser: 2 mg/dL — ABNORMAL HIGH (ref 0.44–1.00)
Glucose, Bld: 117 mg/dL — ABNORMAL HIGH (ref 65–99)
HCT: 34 % — ABNORMAL LOW (ref 36.0–46.0)
Hemoglobin: 11.6 g/dL — ABNORMAL LOW (ref 12.0–15.0)
Potassium: 4.1 mmol/L (ref 3.5–5.1)
Sodium: 130 mmol/L — ABNORMAL LOW (ref 135–145)
TCO2: 24 mmol/L (ref 0–100)

## 2015-09-29 LAB — COMPREHENSIVE METABOLIC PANEL WITH GFR
ALT: 160 U/L — ABNORMAL HIGH (ref 14–54)
AST: 308 U/L — ABNORMAL HIGH (ref 15–41)
Albumin: 3.2 g/dL — ABNORMAL LOW (ref 3.5–5.0)
Alkaline Phosphatase: 315 U/L — ABNORMAL HIGH (ref 38–126)
Anion gap: 7 (ref 5–15)
BUN: 50 mg/dL — ABNORMAL HIGH (ref 6–20)
CO2: 24 mmol/L (ref 22–32)
Calcium: 8.3 mg/dL — ABNORMAL LOW (ref 8.9–10.3)
Chloride: 97 mmol/L — ABNORMAL LOW (ref 101–111)
Creatinine, Ser: 2.04 mg/dL — ABNORMAL HIGH (ref 0.44–1.00)
GFR calc Af Amer: 24 mL/min — ABNORMAL LOW
GFR calc non Af Amer: 21 mL/min — ABNORMAL LOW
Glucose, Bld: 120 mg/dL — ABNORMAL HIGH (ref 65–99)
Potassium: 4 mmol/L (ref 3.5–5.1)
Sodium: 128 mmol/L — ABNORMAL LOW (ref 135–145)
Total Bilirubin: 1.2 mg/dL (ref 0.3–1.2)
Total Protein: 6.3 g/dL — ABNORMAL LOW (ref 6.5–8.1)

## 2015-09-29 LAB — URINALYSIS, ROUTINE W REFLEX MICROSCOPIC
Bilirubin Urine: NEGATIVE
Glucose, UA: NEGATIVE mg/dL
Hgb urine dipstick: NEGATIVE
Ketones, ur: NEGATIVE mg/dL
Leukocytes, UA: NEGATIVE
Nitrite: NEGATIVE
Protein, ur: NEGATIVE mg/dL
Specific Gravity, Urine: 1.011 (ref 1.005–1.030)
pH: 5.5 (ref 5.0–8.0)

## 2015-09-29 LAB — I-STAT TROPONIN, ED: Troponin i, poc: 0 ng/mL (ref 0.00–0.08)

## 2015-09-29 LAB — DIFFERENTIAL
Basophils Absolute: 0 K/uL (ref 0.0–0.1)
Basophils Relative: 0 %
Eosinophils Absolute: 0.9 K/uL — ABNORMAL HIGH (ref 0.0–0.7)
Eosinophils Relative: 13 %
Lymphocytes Relative: 17 %
Lymphs Abs: 1.2 K/uL (ref 0.7–4.0)
Monocytes Absolute: 0.9 K/uL (ref 0.1–1.0)
Monocytes Relative: 13 %
Neutro Abs: 4 K/uL (ref 1.7–7.7)
Neutrophils Relative %: 57 %

## 2015-09-29 LAB — DIGOXIN LEVEL: Digoxin Level: 1.9 ng/mL (ref 0.8–2.0)

## 2015-09-29 LAB — RAPID URINE DRUG SCREEN, HOSP PERFORMED
Amphetamines: NOT DETECTED
Barbiturates: NOT DETECTED
Benzodiazepines: NOT DETECTED
Cocaine: NOT DETECTED
Opiates: POSITIVE — AB
Tetrahydrocannabinol: NOT DETECTED

## 2015-09-29 LAB — CBC
HCT: 32.7 % — ABNORMAL LOW (ref 36.0–46.0)
Hemoglobin: 11.4 g/dL — ABNORMAL LOW (ref 12.0–15.0)
MCH: 30.1 pg (ref 26.0–34.0)
MCHC: 34.9 g/dL (ref 30.0–36.0)
MCV: 86.3 fL (ref 78.0–100.0)
Platelets: 148 K/uL — ABNORMAL LOW (ref 150–400)
RBC: 3.79 MIL/uL — ABNORMAL LOW (ref 3.87–5.11)
RDW: 12.3 % (ref 11.5–15.5)
WBC: 7 K/uL (ref 4.0–10.5)

## 2015-09-29 LAB — PROTIME-INR
INR: 1.22 (ref 0.00–1.49)
Prothrombin Time: 15.1 s (ref 11.6–15.2)

## 2015-09-29 LAB — ETHANOL: Alcohol, Ethyl (B): <5 mg/dL

## 2015-09-29 LAB — APTT: aPTT: 38 s — ABNORMAL HIGH (ref 24–37)

## 2015-09-29 LAB — GAMMA GT: GGT: 163 U/L — AB (ref 7–50)

## 2015-09-29 MED ORDER — BRIMONIDINE TARTRATE-TIMOLOL 0.2-0.5 % OP SOLN: 1.0000 [drp] | Freq: Two times a day (BID) | OPHTHALMIC | Status: DC

## 2015-09-29 MED ORDER — LIOTHYRONINE SODIUM 25 MCG PO TABS
25.0000 ug | ORAL_TABLET | Freq: Every day | ORAL | Status: DC
  Administered 2015-09-30 – 2015-10-01 (×2): 25 ug via ORAL
  Filled 2015-09-29 (×2): qty 1

## 2015-09-29 MED ORDER — DULOXETINE HCL 30 MG PO CPEP
30.0000 mg | ORAL_CAPSULE | Freq: Two times a day (BID) | ORAL | Status: DC
  Administered 2015-09-29 – 2015-10-01 (×4): 30 mg via ORAL
  Filled 2015-09-29 (×4): qty 1

## 2015-09-29 MED ORDER — CARVEDILOL 12.5 MG PO TABS
12.5000 mg | ORAL_TABLET | Freq: Two times a day (BID) | ORAL | Status: DC
  Administered 2015-09-30 – 2015-10-01 (×3): 12.5 mg via ORAL
  Filled 2015-09-29 (×3): qty 1

## 2015-09-29 MED ORDER — ACETAMINOPHEN 325 MG PO TABS: 650.0000 mg | ORAL_TABLET | Freq: Four times a day (QID) | ORAL | Status: DC | PRN

## 2015-09-29 MED ORDER — MOMETASONE FURO-FORMOTEROL FUM 200-5 MCG/ACT IN AERO
2.0000 | INHALATION_SPRAY | Freq: Two times a day (BID) | RESPIRATORY_TRACT | Status: DC
  Filled 2015-09-29: qty 8.8

## 2015-09-29 MED ORDER — ACETAMINOPHEN 650 MG RE SUPP: 650.0000 mg | Freq: Four times a day (QID) | RECTAL | Status: DC | PRN

## 2015-09-29 MED ORDER — TIMOLOL MALEATE 0.5 % OP SOLN
1.0000 [drp] | Freq: Two times a day (BID) | OPHTHALMIC | Status: DC
  Administered 2015-09-29 – 2015-10-01 (×4): 1 [drp] via OPHTHALMIC
  Filled 2015-09-29: qty 5

## 2015-09-29 MED ORDER — SODIUM CHLORIDE 0.9 % IV BOLUS (SEPSIS)
1000.0000 mL | Freq: Once | INTRAVENOUS | Status: AC
  Administered 2015-09-29: 1000 mL via INTRAVENOUS

## 2015-09-29 MED ORDER — LEVOTHYROXINE SODIUM 112 MCG PO TABS
112.0000 ug | ORAL_TABLET | Freq: Every day | ORAL | Status: DC
  Administered 2015-09-30 – 2015-10-01 (×2): 112 ug via ORAL
  Filled 2015-09-29 (×2): qty 1

## 2015-09-29 MED ORDER — SODIUM CHLORIDE 0.9 % IV SOLN
INTRAVENOUS | Status: DC
  Administered 2015-09-29: 21:00:00 via INTRAVENOUS

## 2015-09-29 MED ORDER — BRIMONIDINE TARTRATE 0.2 % OP SOLN
1.0000 [drp] | Freq: Two times a day (BID) | OPHTHALMIC | Status: DC
  Administered 2015-09-29 – 2015-10-01 (×4): 1 [drp] via OPHTHALMIC
  Filled 2015-09-29: qty 5

## 2015-09-29 MED ORDER — HEPARIN SODIUM (PORCINE) 5000 UNIT/ML IJ SOLN
5000.0000 [IU] | Freq: Three times a day (TID) | INTRAMUSCULAR | Status: DC
  Administered 2015-09-29 – 2015-10-01 (×6): 5000 [IU] via SUBCUTANEOUS
  Filled 2015-09-29 (×6): qty 1

## 2015-09-29 MED ORDER — LATANOPROST 0.005 % OP SOLN
1.0000 [drp] | Freq: Every day | OPHTHALMIC | Status: DC
  Administered 2015-09-29 – 2015-09-30 (×2): 1 [drp] via OPHTHALMIC
  Filled 2015-09-29: qty 2.5

## 2015-09-29 MED ORDER — SODIUM CHLORIDE 0.9% FLUSH
3.0000 mL | Freq: Two times a day (BID) | INTRAVENOUS | Status: DC
  Administered 2015-09-29 – 2015-09-30 (×2): 3 mL via INTRAVENOUS

## 2015-09-29 MED ORDER — CO-ENZYME Q-10 50 MG PO CAPS: 50.0000 mg | ORAL_CAPSULE | Freq: Every day | ORAL | Status: DC

## 2015-09-29 NOTE — Consult Note (Signed)
Neurology Consultation Reason for Consult: Transient aphasia Referring Physician: Georges Mouse  CC: Transient aphasia  History is obtained from: Family  HPI: [name] is a 82 y.o. female with a history of cardiomyopathy who presents with transient aphasia. She states that she has had some difficulty with word finding over the past couple of days. Today, however she had about a 15 minute episode of completely nonsensical speech. Her daughter describes the things that she was saying as "nonsense" without formed words. She may have had some slight dysarthria, but this was not the reason for not being able to understand her. The patient states that she feels like she has had some difficulty with speech even since yesterday, and that has improved but the episode of most concern 1 lasted 15-20 minutes.   LKW: Unclear, at least yesterday tpa given?: no, out of window  ROS: A 14 point ROS was performed and is negative except as noted in the HPI.   Past Medical History  Diagnosis Date  . AICD (automatic cardioverter/defibrillator) present 06/27/2007    medtronic  . Thyroid disease   . Cancer ([place])     ovarian--stage 4  . Cardiomyopathy ([place])   . CHF (congestive heart failure) ([place])   . Systemic hypertension   . Peripheral neuropathy ([facility])      Family History  Problem Relation Age of Onset  . Heart disease Paternal Grandmother   . Lung cancer Mother     died from cancer  . Heart attack Father   . Lung cancer Father      Social History:  reports that she has never smoked. She does not have any smokeless tobacco history on file. She reports that she does not drink alcohol or use illicit drugs.   Exam: Current vital signs: BP 154/37 mmHg  Pulse 75  Temp(Src) 98.1 F (36.7 C) (Oral)  Resp 20  Ht 5' (1.524 m)  SpO2 94% Vital signs in last 24 hours: Temp:  [98.1 F (36.7 C)] 98.1 F (36.7 C) (07/03 1232) Pulse Rate:  [59-75] 75 (07/03 1845) Resp:  [16-22] 20 (07/03 1845) BP:  (126-154)/(37-65) 154/37 mmHg (07/03 1845) SpO2:  [94 %-98 %] 94 % (07/03 1845)   Physical Exam  Constitutional: Appears well-developed and well-nourished.  Psych: Affect appropriate to situation Eyes: No scleral injection HENT: No OP obstrucion Head: Normocephalic.  Cardiovascular: Normal rate and regular rhythm.  Respiratory: Effort normal  GI: Soft.  No distension. There is no tenderness.  Skin: WDI  Neuro: Mental Status: Patient is awake, alert, oriented to person, place, month, year, and situation. Patient is able to give a clear and coherent history. No signs of aphasia or neglect Cranial Nerves: II: Visual Fields are full. Pupils are very slightly asymmetric, with the left slightly larger than the right III,IV, VI: EOMI without ptosis or diploplia.  V: Facial sensation is symmetric to temperature VII: Facial movement is symmetric.  VIII: hearing is intact to voice X: Uvula elevates symmetrically XI: Shoulder shrug is symmetric. XII: tongue is midline without atrophy or fasciculations.  Motor: Tone is normal. Bulk is normal. 5/5 strength was present in all four extremities.  Sensory: Sensation is symmetric to light touch and temperature in the arms and legs. Cerebellar: FNF  intact bilaterally      I have reviewed labs in epic and the results pertinent to this consultation are: Elevated creatinine, elevated LFTs  I have reviewed the images obtained: CT head-no acute findings  Impression: 82 year old female with  transient aphasia. I suspect that her mild confusion is likely multifactorial encephalopathy however transient episode of severe aphasia is much more concerning for possible TIA or stroke. He does have multiple risk factors for this. Unfortunately she cannot get an MRI, and I would favor treating this as TIA, though I would also perform an EEG though from the description I feel that this is much less likely to be seizure then ischemic. Unfortunately with AKI,  she also cannot have contrast for vascular imaging.  Recommendations: 1. HgbA1c, fasting lipid panel 2. EEG 3. Frequent neuro checks 4. Echocardiogram 5. Carotid dopplers 6. Prophylactic therapy-Antiplatelet med: Aspirin - dose 325mg  PO or 300mg  PR 7. Risk factor modification 8. Telemetry monitoring 9. please page stroke NP  Or  PA  Or MD  M-F from 8am -4 pm starting 7/4 as this patient will be followed by the stroke team at this point.   You can look them up on [email or web address]   10. Treatment of AKI< transaminase elevation, etc. Per IM.   Roland Rack, MD [facility] [phone]  If 7pm- 7am, please page neurology on call as listed in [place].

## 2015-09-29 NOTE — Progress Notes (Signed)
Patient states she had 5 days of loose, watery stool. Patient states she has not had bowel movement since 7/1.

## 2015-09-29 NOTE — ED Notes (Signed)
Hospitalist at bedside 

## 2015-09-29 NOTE — ED Notes (Signed)
[facility] called for transport. 

## 2015-09-29 NOTE — ED Notes (Signed)
Attempted to call report, RN unavailable at this time.

## 2015-09-29 NOTE — ED Provider Notes (Signed)
CSN: [id number]     Arrival date & time 09/29/15  1229 History   First MD Initiated Contact with Patient 09/29/15 1319     Chief Complaint  Patient presents with  . difficulty speaking   . Emesis  . Diarrhea     (Consider location/radiation/quality/duration/timing/severity/associated sxs/prior Treatment) HPI   [name] is an 82 y.o F with a pmhx of AICD, ovarian Ca, CHF who presents to the ED today Complaining of "difficulty finding her words". Patient states that 3 days ago she developed severe nausea, vomiting and diarrhea. These symptoms lasted for approximately 24 hours. Patient reports feeling very weak and tired since. She denies any associated fevers, chills, abdominal pain, hematemesis, melena or hematochezia. Per patient's daughter who is at bedside yesterday patient had difficulty with word finding. Patient was actively trying to make a point and was unable to speak normally. Patient became frustrated as she couldn't find the right words to say. Patient's daughter states that this occurred again today when she was trying to give instructions. Patient's daughter contacted the PCP who instructed them to come to the ED to rule out the possibility of a stroke. Patient denies any weakness, facial drooping, chest pain, headache, blurry vision, new paresthesias. Past Medical History  Diagnosis Date  . AICD (automatic cardioverter/defibrillator) present 06/27/2007    medtronic  . Thyroid disease   . Cancer ([place])     ovarian--stage 4  . Cardiomyopathy ([place])   . CHF (congestive heart failure) ([address])   . Systemic hypertension   . Peripheral neuropathy [facility])    Past Surgical History  Procedure Laterality Date  . Appendectomy    . Cholecystectomy    . Cardiac defibrillator placement  06/27/2007    medtronic concerto  . Abdominal hysterectomy  oct 2000    along with ovarian ca surg  . Cardiac catheterization  09/27/1990    normal coronaries,dilated cardiomyopathy  . Nm myocar perf  wall motion  06/05/2007    no ischemia  . Implantable cardioverter defibrillator generator change N/A 07/31/2013    Procedure: IMPLANTABLE CARDIOVERTER DEFIBRILLATOR GENERATOR CHANGE;  Surgeon: Sanda Klein, MD;  Location: [facility] CATH LAB;  Service: Cardiovascular;  Laterality: N/A;   Family History  Problem Relation Age of Onset  . Heart disease Paternal Grandmother   . Lung cancer Mother     died from cancer  . Lung cancer Father    Social History  Substance Use Topics  . Smoking status: Never Smoker   . Smokeless tobacco: None  . Alcohol Use: No   OB History    No data available     Review of Systems    Allergies  Review of patient's allergies indicates no known allergies.  Home Medications   Prior to Admission medications   Medication Sig Start Date End Date Taking? Authorizing Provider  alendronate (FOSAMAX) 70 MG tablet Take 70 mg by mouth once a week. Sundays 09/01/15  Yes Historical Provider, MD  calcium-vitamin D (OSCAL WITH D) 250-125 MG-UNIT per tablet Take 2 tablets by mouth daily.    Yes Historical Provider, MD  carvedilol (COREG) 12.5 MG tablet Take 1 tablet (12.5 mg total) by mouth 2 (two) times daily with a meal. 06/06/15  Yes Mihai Croitoru, MD  COMBIGAN 0.2-0.5 % ophthalmic solution Place 1 drop into both eyes every 12 (twelve) hours.  03/27/15  Yes Historical Provider, MD  digoxin (LANOXIN) 0.25 MG tablet TAKE 1 TABLET DAILY 05/30/15  Yes Sanda Klein, MD  DULERA 200-5 MCG/ACT  AERO Inhale 2 puffs into the lungs 2 (two) times daily. 09/22/15  Yes Historical Provider, MD  DULoxetine (CYMBALTA) 30 MG capsule Take 30 mg by mouth 2 (two) times daily.  08/25/15  Yes Historical Provider, MD  furosemide (LASIX) 20 MG tablet TAKE ONE-HALF (1/2) TABLET DAILY 11/13/14  Yes Mihai Croitoru, MD  latanoprost (XALATAN) 0.005 % ophthalmic solution Place 1 drop into both eyes at bedtime. 03/05/14  Yes Historical Provider, MD  liothyronine (CYTOMEL) 25 MCG tablet Take 25 mcg by mouth  daily. 07/02/15  Yes Historical Provider, MD  losartan (COZAAR) 100 MG tablet TAKE 1 TABLET DAILY 05/30/15  Yes Mihai Croitoru, MD  oxyCODONE (ROXICODONE) 15 MG immediate release tablet Take 15 mg by mouth every 6 (six) hours as needed for pain.  02/28/15  Yes Historical Provider, MD  SYNTHROID 112 MCG tablet Take 112 mcg by mouth daily before breakfast.  09/12/15  Yes Historical Provider, MD  co-enzyme Q-10 50 MG capsule Take 50 mg by mouth daily. Reported on 09/29/2015    Historical Provider, MD   BP 126/52 mmHg  Pulse 59  Temp(Src) 98.1 F (36.7 C) (Oral)  Resp 16  SpO2 96% Physical Exam  Constitutional: She is oriented to person, place, and time. She appears well-developed and well-nourished. No distress.  HENT:  Head: Normocephalic and atraumatic.  Mouth/Throat: No oropharyngeal exudate.  Eyes: Conjunctivae and EOM are normal. Pupils are equal, round, and reactive to light. Right eye exhibits no discharge. Left eye exhibits no discharge. No scleral icterus.  Cardiovascular: Normal rate, regular rhythm, normal heart sounds and intact distal pulses.  Exam reveals no gallop and no friction rub.   No murmur heard. Pulmonary/Chest: Effort normal and breath sounds normal. No respiratory distress. She has no wheezes. She has no rales. She exhibits no tenderness.  Abdominal: Soft. She exhibits no distension. There is no tenderness. There is no guarding.  Musculoskeletal: Normal range of motion. She exhibits no edema.  Neurological: She is alert and oriented to person, place, and time.  Strength 5/5 throughout. No sensory deficits.  No facial droop. No pronator drift. No gait abnormality. No slurred speech.  Skin: Skin is warm and dry. No rash noted. She is not diaphoretic. No erythema. No pallor.  Psychiatric: She has a normal mood and affect. Her behavior is normal.  Nursing note and vitals reviewed.   ED Course  Procedures (including critical care time) Labs Review Labs Reviewed  APTT -  Abnormal; Notable for the following:    aPTT 38 (*)    All other components within normal limits  CBC - Abnormal; Notable for the following:    RBC 3.79 (*)    Hemoglobin 11.4 (*)    HCT 32.7 (*)    Platelets 148 (*)    All other components within normal limits  DIFFERENTIAL - Abnormal; Notable for the following:    Eosinophils Absolute 0.9 (*)    All other components within normal limits  COMPREHENSIVE METABOLIC PANEL - Abnormal; Notable for the following:    Sodium 128 (*)    Chloride 97 (*)    Glucose, Bld 120 (*)    BUN 50 (*)    Creatinine, Ser 2.04 (*)    Calcium 8.3 (*)    Total Protein 6.3 (*)    Albumin 3.2 (*)    AST 308 (*)    ALT 160 (*)    Alkaline Phosphatase 315 (*)    GFR calc non Af Amer 21 (*)  GFR calc Af Amer 24 (*)    All other components within normal limits  I-STAT CHEM 8, ED - Abnormal; Notable for the following:    Sodium 130 (*)    Chloride 96 (*)    BUN 44 (*)    Creatinine, Ser 2.00 (*)    Glucose, Bld 117 (*)    Hemoglobin 11.6 (*)    HCT 34.0 (*)    All other components within normal limits  ETHANOL  PROTIME-INR  DIGOXIN LEVEL  URINE RAPID DRUG SCREEN, HOSP PERFORMED  URINALYSIS, ROUTINE W REFLEX MICROSCOPIC (NOT AT [facility])  I-STAT TROPOININ, ED    Imaging Review Ct Head Wo Contrast  09/29/2015  CLINICAL DATA:  Difficulty forming words since yesterday. EXAM: CT HEAD WITHOUT CONTRAST TECHNIQUE: Contiguous axial images were obtained from the base of the skull through the vertex without intravenous contrast. COMPARISON:  Report from study 02/04/1999.  No images available. FINDINGS: Area of encephalomalacia in the right temporal lobe versus arachnoid cyst in the middle cranial fossa. This was described on prior CT and felt at that time to represent an arachnoid cyst. No hemorrhage or hydrocephalus. No acute infarct or midline shift. No acute calvarial abnormality. Visualized paranasal sinuses and mastoids clear. Orbital soft tissues unremarkable.  IMPRESSION: Probable right middle cranial fossa arachnoid cyst versus right temporal lobe encephalomalacia. This finding was described on 2000 CT. No acute intracranial abnormality. Electronically Signed   By: Rolm Baptise M.D.   On: 09/29/2015 15:15   I have personally reviewed and evaluated these images and lab results as part of my medical decision-making.   EKG Interpretation None      MDM   Final diagnoses:  AKI (acute kidney injury) [facility])  Dysarthria    82 y.o F w/ hx of AICD, CHF presents to the ED c/o difficulty word finding since yesterday. Pt was sent here by PCP to r./o stroke. Pt appears well in theED, in NAD. Alert and oriented x 3. No neurodeficits noted on exam. CT head normal. Unable to obtain MRI due to pacemaker. Pt also noted to have an AKI, Cr is 2 which has tripled since her last visit. BUN also elevated at 50. Na low at 128. This is possibly due to recent N/V/D that occurred 2 days ago. Upon review of pts cardiology records, EF improved after AICD placement, will administer 1L NS. AST and ALT also elevated, possible hepatitis. Spoke with Dr. Maudie Mercury with [facility] who [facility] to [facility] for AKI, transaminitis and stroke workup. Neuro consulted per Dr. Julianne Rice recommendations. Spoke with Dr. Shon Hale with neuro who will consult pt once admitted to [facility].   Patient was discussed with and seen by Dr. Roxanne Mins who agrees with the treatment plan.      Dondra Spry [place], PA-C [id number]  David Glick, MD [phone] [id number]

## 2015-09-29 NOTE — ED Notes (Signed)
Pt reports trouble forming words since yesterday. No extremity weakness or confusion. Pt also had emesis on Friday and diarrhea since then as well.

## 2015-09-29 NOTE — Progress Notes (Signed)
Patient transferred to 5M10. Patient is alert, oriented x3, forgetful. NIHHS 0. Skin intact. Family and patient updated on plan of care. Ultrasound attempted to pick patient up to complete ultrasound, patient refused because she wanted to eat, educated, still refused. Ultrasound notified. Will keep patient NPO after midnight to complete ultrasound tomorrow.

## 2015-09-29 NOTE — ED Notes (Signed)
Assisted pt to restroom with walker assistant. Pt got off balance a few times.

## 2015-09-29 NOTE — H&P (Addendum)
TRH H&P   Patient Demographics:    [name], is a 82 y.o. female  MRN: [id number]   DOB - July 19, 1928  Admit Date - 09/29/2015  Outpatient Primary MD for the patient is Betty Martinique, MD  Referring MD/NP/PA:  Delora Fuel  Outpatient Specialists: Dr. Avon Gully (cardiologist)  Patient coming from: home  Chief Complaint  Patient presents with  . difficulty speaking   . Emesis  . Diarrhea      HPI:    [name]  is a 82 y.o. female, w hx of Hypertension, CHF (EF 55%), Aflutter, apparently c/o dysarthria, starting yesterday.  + n/v, diarrhea.  "loose stool almost pure water"  5x yesterday.  Pt denies any recent travel or odd foods eaten.  Pt presented to ED due to persistent slurred speech and trouble finding the right words.  Denies numbness, tingling , weakness (focal), headache, vision change.    In ED,  CT brain negative. Unable to get MRI due to pacer.  Pt will be [facility] to [facility] for w/up of TIA/ CVA, Acute renal failure, n/v, diarrhea.      Review of systems:    In addition to the HPI above, No Fever-chills, No Headache, No changes with Vision or hearing, No problems swallowing food or Liquids, No Chest pain, Cough or Shortness of Breath, No Abdominal pain,  No Blood in stool or Urine, No dysuria, No new skin rashes or bruises, No new joints pains-aches,  No new weakness, tingling, numbness in any extremity, No recent weight gain or loss, No polyuria, polydypsia or polyphagia, No significant Mental Stressors.  A full 10 point Review of Systems was done, except as stated above, all other Review of Systems were negative.   With Past History of the following :    Past Medical History  Diagnosis Date  . AICD (automatic cardioverter/defibrillator) present 06/27/2007    medtronic  . Thyroid disease   . Cancer ([place])     ovarian--stage 4  .  Cardiomyopathy ([place])   . CHF (congestive heart failure) ([place])   . Systemic hypertension   . Peripheral neuropathy [facility])       Past Surgical History  Procedure Laterality Date  . Appendectomy    . Cholecystectomy    . Cardiac defibrillator placement  06/27/2007    medtronic concerto  . Abdominal hysterectomy  oct 2000    along with ovarian ca surg  . Cardiac catheterization  09/27/1990    normal coronaries,dilated cardiomyopathy  . Nm myocar perf wall motion  06/05/2007    no ischemia  . Implantable cardioverter defibrillator generator change N/A 07/31/2013    Procedure: IMPLANTABLE CARDIOVERTER DEFIBRILLATOR GENERATOR CHANGE;  Surgeon: Sanda Klein, MD;  Location: [facility] CATH LAB;  Service: Cardiovascular;  Laterality: N/A;      Social History:     Social History  Substance Use Topics  . Smoking status:  Never Smoker   . Smokeless tobacco: Not on file  . Alcohol Use: No     Lives - at home  Mobility -    Family History :     Family History  Problem Relation Age of Onset  . Heart disease Paternal Grandmother   . Lung cancer Mother     died from cancer  . Heart attack Father   . Lung cancer Father      Home Medications:   Prior to Admission medications   Medication Sig Start Date End Date Taking? Authorizing Provider  alendronate (FOSAMAX) 70 MG tablet Take 70 mg by mouth once a week. Sundays 09/01/15  Yes Historical Provider, MD  calcium-vitamin D (OSCAL WITH D) 250-125 MG-UNIT per tablet Take 2 tablets by mouth daily.    Yes Historical Provider, MD  carvedilol (COREG) 12.5 MG tablet Take 1 tablet (12.5 mg total) by mouth 2 (two) times daily with a meal. 06/06/15  Yes Mihai Croitoru, MD  COMBIGAN 0.2-0.5 % ophthalmic solution Place 1 drop into both eyes every 12 (twelve) hours.  03/27/15  Yes Historical Provider, MD  digoxin (LANOXIN) 0.25 MG tablet TAKE 1 TABLET DAILY 05/30/15  Yes Mihai Croitoru, MD  DULERA 200-5 MCG/ACT AERO Inhale 2 puffs into the lungs 2 (two) times  daily. 09/22/15  Yes Historical Provider, MD  DULoxetine (CYMBALTA) 30 MG capsule Take 30 mg by mouth 2 (two) times daily.  08/25/15  Yes Historical Provider, MD  furosemide (LASIX) 20 MG tablet TAKE ONE-HALF (1/2) TABLET DAILY 11/13/14  Yes Mihai Croitoru, MD  latanoprost (XALATAN) 0.005 % ophthalmic solution Place 1 drop into both eyes at bedtime. 03/05/14  Yes Historical Provider, MD  liothyronine (CYTOMEL) 25 MCG tablet Take 25 mcg by mouth daily. 07/02/15  Yes Historical Provider, MD  losartan (COZAAR) 100 MG tablet TAKE 1 TABLET DAILY 05/30/15  Yes Mihai Croitoru, MD  oxyCODONE (ROXICODONE) 15 MG immediate release tablet Take 15 mg by mouth every 6 (six) hours as needed for pain.  02/28/15  Yes Historical Provider, MD  SYNTHROID 112 MCG tablet Take 112 mcg by mouth daily before breakfast.  09/12/15  Yes Historical Provider, MD  co-enzyme Q-10 50 MG capsule Take 50 mg by mouth daily. Reported on 09/29/2015    Historical Provider, MD     Allergies:    No Known Allergies   Physical Exam:   Vitals  Blood pressure 132/65, pulse 60, temperature 98.1 F (36.7 C), temperature source Oral, resp. rate 22, SpO2 96 %.   1. General  lying in bed in NAD,    2. Normal affect and insight, Not Suicidal or Homicidal, Awake Alert, Oriented X 3.  3. No F.N deficits, ALL C.Nerves Intact, Strength 5/5 all 4 extremities, Sensation intact all 4 extremities, Plantars down going.  4. Ears and Eyes appear Normal, Conjunctivae clear, PERRLA. Moist Oral Mucosa.  5. Supple Neck, No JVD, No cervical lymphadenopathy appriciated, No Carotid Bruits.  6. Symmetrical Chest wall movement, Good air movement bilaterally, CTAB.  7. RRR, No Gallops, Rubs or Murmurs, No Parasternal Heave.  8. Positive Bowel Sounds, Abdomen Soft, No tenderness, No organomegaly appriciated,No rebound -guarding or rigidity.  9.  No Cyanosis, Normal Skin Turgor, No Skin Rash or Bruise.  10. Good muscle tone,  joints appear normal , no  effusions, Normal ROM.  11. No Palpable Lymph Nodes in Neck or Axillae     Data Review:    CBC  Recent Labs Lab 09/29/15 1413 09/29/15 1421  WBC 7.0  --   HGB 11.4* 11.6*  HCT 32.7* 34.0*  PLT 148*  --   MCV 86.3  --   MCH 30.1  --   MCHC 34.9  --   RDW 12.3  --   LYMPHSABS 1.2  --   MONOABS 0.9  --   EOSABS 0.9*  --   BASOSABS 0.0  --    ------------------------------------------------------------------------------------------------------------------  Chemistries   Recent Labs Lab 09/29/15 1413 09/29/15 1421  NA 128* 130*  K 4.0 4.1  CL 97* 96*  CO2 24  --   GLUCOSE 120* 117*  BUN 50* 44*  CREATININE 2.04* 2.00*  CALCIUM 8.3*  --   AST 308*  --   ALT 160*  --   ALKPHOS 315*  --   BILITOT 1.2  --    ------------------------------------------------------------------------------------------------------------------ CrCl cannot be calculated (Unknown ideal weight.). ------------------------------------------------------------------------------------------------------------------ No results for input(s): TSH, T4TOTAL, T3FREE, THYROIDAB in the last 72 hours.  Invalid input(s): FREET3  Coagulation profile  Recent Labs Lab 09/29/15 1413  INR 1.22   ------------------------------------------------------------------------------------------------------------------- No results for input(s): DDIMER in the last 72 hours. -------------------------------------------------------------------------------------------------------------------  Cardiac Enzymes No results for input(s): CKMB, TROPONINI, MYOGLOBIN in the last 168 hours.  Invalid input(s): CK ------------------------------------------------------------------------------------------------------------------ No results found for: BNP   ---------------------------------------------------------------------------------------------------------------  Urinalysis    Component Value Date/Time   COLORURINE  AMBER* 09/29/2015 1600   APPEARANCEUR CLEAR 09/29/2015 1600   LABSPEC 1.011 09/29/2015 1600   PHURINE 5.5 09/29/2015 1600   GLUCOSEU NEGATIVE 09/29/2015 1600   HGBUR NEGATIVE 09/29/2015 1600   BILIRUBINUR NEGATIVE 09/29/2015 1600   KETONESUR NEGATIVE 09/29/2015 1600   PROTEINUR NEGATIVE 09/29/2015 1600   NITRITE NEGATIVE 09/29/2015 1600   LEUKOCYTESUR NEGATIVE 09/29/2015 1600    ----------------------------------------------------------------------------------------------------------------   Imaging Results:    Ct Head Wo Contrast  09/29/2015  CLINICAL DATA:  Difficulty forming words since yesterday. EXAM: CT HEAD WITHOUT CONTRAST TECHNIQUE: Contiguous axial images were obtained from the base of the skull through the vertex without intravenous contrast. COMPARISON:  Report from study 02/04/1999.  No images available. FINDINGS: Area of encephalomalacia in the right temporal lobe versus arachnoid cyst in the middle cranial fossa. This was described on prior CT and felt at that time to represent an arachnoid cyst. No hemorrhage or hydrocephalus. No acute infarct or midline shift. No acute calvarial abnormality. Visualized paranasal sinuses and mastoids clear. Orbital soft tissues unremarkable. IMPRESSION: Probable right middle cranial fossa arachnoid cyst versus right temporal lobe encephalomalacia. This finding was described on 2000 CT. No acute intracranial abnormality. Electronically Signed   By: Rolm Baptise M.D.   On: 09/29/2015 15:15       Assessment & Plan:    Active Problems:   Hypothyroidism   Essential hypertension   Dysarthria   Abnormal liver function   Acute renal failure (ARF) (HCC)   Hyponatremia    1. Dysarthria, r/o CVA Cont aspirin Repeat CT brain tomorrow Check carotid ultrasound Check cardiac 2D echo  2. N/v, diarrhea Check trop.  Check stool studies protonix zofran  3. ARF Secondary to dehydration Check urine sodium, urine eosinophils, urine  creatinine Check abdominal ultrasound STOP LOSARTAN Avoid NSAIDS Hold lasix Hydrate with ns iv  4. Abnormal lft Check acute hepatitis panel Check abdominal ultrasound Check GGT  5. Anemia Check cbc in am  6. Hyponatremia ? Due to diarrhea.  Hydrate with  Ns iv Check cmp in am     DVT Prophylaxis Heparin - SCDs   AM Labs  Ordered, also please review Full Orders  Family Communication: Admission, patients condition and plan of care including tests being ordered have been discussed with the patient  who indicate understanding and agree with the plan and Code Status.  Code Status FULL CODE  Likely DC to  home  Condition GUARDED   Consults called:  Neurology by ED  Admission status: inpatient  Time spent in minutes : 45 minutes   Jani Gravel M.D on 09/29/2015 at 5:00 PM  Between 7am to 7pm - Pager - [phone]. After 7pm go to [email or web address] - password [facility]  [facility] - Office  [phone]

## 2015-09-30 ENCOUNTER — Encounter (HOSPITAL_COMMUNITY): Payer: PRIVATE HEALTH INSURANCE

## 2015-09-30 ENCOUNTER — Inpatient Hospital Stay (HOSPITAL_COMMUNITY): Payer: Medicare Other

## 2015-09-30 ENCOUNTER — Inpatient Hospital Stay (HOSPITAL_COMMUNITY)
Admit: 2015-09-30 | Discharge: 2015-09-30 | Disposition: A | Discharge status: Home / Self Care | Payer: Medicare Other | Attending: Neurology | Admitting: Neurology

## 2015-09-30 DIAGNOSIS — I639 Cerebral infarction, unspecified: Secondary | ICD-10-CM

## 2015-09-30 DIAGNOSIS — G9340 Encephalopathy, unspecified: Secondary | ICD-10-CM

## 2015-09-30 DIAGNOSIS — G459 Transient cerebral ischemic attack, unspecified: Secondary | ICD-10-CM

## 2015-09-30 DIAGNOSIS — E038 Other specified hypothyroidism: Secondary | ICD-10-CM

## 2015-09-30 DIAGNOSIS — I10 Essential (primary) hypertension: Secondary | ICD-10-CM

## 2015-09-30 DIAGNOSIS — N179 Acute kidney failure, unspecified: Secondary | ICD-10-CM | POA: Insufficient documentation

## 2015-09-30 LAB — BASIC METABOLIC PANEL WITH GFR
Anion gap: 6 (ref 5–15)
BUN: 33 mg/dL — ABNORMAL HIGH (ref 6–20)
CO2: 20 mmol/L — ABNORMAL LOW (ref 22–32)
Calcium: 8.4 mg/dL — ABNORMAL LOW (ref 8.9–10.3)
Chloride: 108 mmol/L (ref 101–111)
Creatinine, Ser: 1.05 mg/dL — ABNORMAL HIGH (ref 0.44–1.00)
GFR calc Af Amer: 55 mL/min — ABNORMAL LOW
GFR calc non Af Amer: 47 mL/min — ABNORMAL LOW
Glucose, Bld: 108 mg/dL — ABNORMAL HIGH (ref 65–99)
Potassium: 3.6 mmol/L (ref 3.5–5.1)
Sodium: 134 mmol/L — ABNORMAL LOW (ref 135–145)

## 2015-09-30 LAB — ECHOCARDIOGRAM COMPLETE
AVLVOTPG: 13 mmHg
CHL CUP LVOT MV VTI INDEX: 1.47 cm2/m2
CHL CUP MV M VEL: 88.8
E decel time: 327 msec
EERAT: 18.72
FS: 30 % (ref 28–44)
HEIGHTINCHES: 60 in
IVS/LV PW RATIO, ED: 0.76
LA diam end sys: 50 mm
LA diam index: 3.33 cm/m2
LA vol A4C: 82 ml
LASIZE: 50 mm
LAVOL: 83.1 mL
LAVOLIN: 55.4 mL/m2
LDCA: 3.14 cm2
LV E/e'average: 18.72
LV PW d: 18 mm — AB (ref 0.6–1.1)
LV TDI E'LATERAL: 3.83
LV sys vol index: 13 mL/m2
LVDIAVOL: 66 mL (ref 46–106)
LVDIAVOLIN: 44 mL/m2
LVEEMED: 18.72
LVELAT: 3.83 cm/s
LVOT MV VTI: 2.21
LVOT SV: 114 mL
LVOT VTI: 36.4 cm
LVOT peak vel: 177 cm/s
LVOTD: 20 mm
LVSYSVOL: 19 mL (ref 14–42)
MV Annulus VTI: 51.8 cm
MV Dec: 327
MV pk E vel: 71.7 m/s
MVG: 4 mmHg
MVPG: 2 mmHg
MVPKAVEL: 139 m/s
P 1/2 time: 528 ms
Simpson's disk: 71
Stroke v: 47 ml
TDI e' medial: 3.75
WEIGHTICAEL: 1911.83 [oz_av]

## 2015-09-30 LAB — HEPATIC FUNCTION PANEL
ALBUMIN: 2.7 g/dL — AB (ref 3.5–5.0)
ALK PHOS: 254 U/L — AB (ref 38–126)
ALT: 95 U/L — AB (ref 14–54)
AST: 90 U/L — AB (ref 15–41)
Bilirubin, Direct: 0.3 mg/dL (ref 0.1–0.5)
Indirect Bilirubin: NEGATIVE mg/dL (ref 0.3–0.9)
TOTAL PROTEIN: 5.6 g/dL — AB (ref 6.5–8.1)
Total Bilirubin: 0.2 mg/dL — ABNORMAL LOW (ref 0.3–1.2)

## 2015-09-30 LAB — LIPID PANEL
CHOLESTEROL: 135 mg/dL (ref 0–200)
HDL: 32 mg/dL — AB (ref >40)
LDL CALC: 80 mg/dL (ref 0–99)
TRIGLYCERIDES: 113 mg/dL (ref <150)
Total CHOL/HDL Ratio: 4.2 RATIO
VLDL: 23 mg/dL (ref 0–40)

## 2015-09-30 LAB — CBC
HEMATOCRIT: 34.9 % — AB (ref 36.0–46.0)
HEMOGLOBIN: 11.6 g/dL — AB (ref 12.0–15.0)
MCH: 29.3 pg (ref 26.0–34.0)
MCHC: 33.2 g/dL (ref 30.0–36.0)
MCV: 88.1 fL (ref 78.0–100.0)
Platelets: 145 10*3/uL — ABNORMAL LOW (ref 150–400)
RBC: 3.96 MIL/uL (ref 3.87–5.11)
RDW: 12.1 % (ref 11.5–15.5)
WBC: 6.5 10*3/uL (ref 4.0–10.5)

## 2015-09-30 LAB — SODIUM, URINE, RANDOM: SODIUM UR: 23 mmol/L

## 2015-09-30 LAB — HEPATITIS PANEL, ACUTE
HCV Ab: 0.1 s/co ratio (ref 0.0–0.9)
Hep A IgM: NEGATIVE
Hep B C IgM: NEGATIVE
Hepatitis B Surface Ag: NEGATIVE

## 2015-09-30 LAB — VITAMIN B12: Vitamin B-12: 826 pg/mL (ref 180–914)

## 2015-09-30 LAB — TSH: TSH: 0.679 u[IU]/mL (ref 0.350–4.500)

## 2015-09-30 MED ORDER — ASPIRIN EC 325 MG PO TBEC
325.0000 mg | DELAYED_RELEASE_TABLET | Freq: Every day | ORAL | Status: DC
  Administered 2015-09-30 – 2015-10-01 (×2): 325 mg via ORAL
  Filled 2015-09-30 (×2): qty 1

## 2015-09-30 MED ORDER — OXYCODONE HCL 5 MG PO TABS
15.0000 mg | ORAL_TABLET | Freq: Four times a day (QID) | ORAL | Status: DC | PRN
  Administered 2015-09-30 (×2): 15 mg via ORAL
  Filled 2015-09-30 (×2): qty 3

## 2015-09-30 MED ORDER — ENSURE ENLIVE PO LIQD
237.0000 mL | Freq: Two times a day (BID) | ORAL | Status: DC
  Administered 2015-09-30 – 2015-10-01 (×3): 237 mL via ORAL

## 2015-09-30 NOTE — Progress Notes (Signed)
Electroencephalogram (EEG) Report  Date of study: 09/30/15  Requesting clinician: Roland Rack, MD  Reason for study: Evaluate encephalopathy, exclude seizure  Brief clinical history: 77-yo woman with transient aphasia and mild confusion.   Medications:  Current facility-administered medications:  .  acetaminophen (TYLENOL) tablet 650 mg, 650 mg, Oral, Q6H PRN **OR** acetaminophen (TYLENOL) suppository 650 mg, 650 mg, Rectal, Q6H PRN, Jani Gravel, MD .  brimonidine (ALPHAGAN) 0.2 % ophthalmic solution 1 drop, 1 drop, Both Eyes, Q12H, 1 drop at 09/30/15 1034 **AND** timolol (TIMOPTIC) 0.5 % ophthalmic solution 1 drop, 1 drop, Both Eyes, BID, Jani Gravel, MD, 1 drop at 09/30/15 1034 .  carvedilol (COREG) tablet 12.5 mg, 12.5 mg, Oral, BID WC, Jani Gravel, MD, 12.5 mg at 09/30/15 0839 .  DULoxetine (CYMBALTA) DR capsule 30 mg, 30 mg, Oral, BID, Jani Gravel, MD, 30 mg at 09/30/15 1033 .  heparin injection 5,000 Units, 5,000 Units, Subcutaneous, Q8H, Jani Gravel, MD, 5,000 Units at 09/30/15 [id number] .  latanoprost (XALATAN) 0.005 % ophthalmic solution 1 drop, 1 drop, Both Eyes, QHS, Jani Gravel, MD, 1 drop at 09/29/15 2121 .  levothyroxine (SYNTHROID, LEVOTHROID) tablet 112 mcg, 112 mcg, Oral, QAC breakfast, Jani Gravel, MD, 112 mcg at 09/30/15 0819 .  liothyronine (CYTOMEL) tablet 25 mcg, 25 mcg, Oral, Daily, Jani Gravel, MD, 25 mcg at 09/30/15 1033 .  mometasone-formoterol (DULERA) 200-5 MCG/ACT inhaler 2 puff, 2 puff, Inhalation, BID, Jani Gravel, MD, 2 puff at 09/29/15 2046 .  oxyCODONE (Oxy IR/ROXICODONE) immediate release tablet 15 mg, 15 mg, Oral, Q6H PRN, Janece Canterbury, MD, 15 mg at 09/30/15 0819 .  sodium chloride flush (NS) 0.9 % injection 3 mL, 3 mL, Intravenous, Q12H, Jani Gravel, MD, 3 mL at 09/29/15 2122  Description: This is a routine EEG performed using standard international 10-20 electrode placement. A total of 18 channels are recorded, including one for the EKG.  Marland Kitchen   Activating Maneuvers:  Deferred as the patient is undergoing stroke workup   Findings:  The background consists of well-formed theta activity. The best dominant posterior rhythm is 6 Hz. This is symmetric and reacts as expected with eye opening. The background architecture is mildly disorganized.   There are no focal asymmetries. No epileptiform discharges are present. No seizures are recorded.   Drowsiness is recorded and is normal in appearance   Impression: This is an abnormal EEG due to mild diffuse generalized slowing. This is nonspecific and suggests a global encephalopathic process. Clinical correlation required.    Melba Coon, MD [facility]

## 2015-09-30 NOTE — Progress Notes (Signed)
PROGRESS NOTE  [name]  [id number] DOB: Aug 01, 1928 DOA: 09/29/2015 PCP: Betty Martinique, MD  Brief Narrative:   [name] is a 82 y.o. female, w hx of Hypertension, CHF (EF 55%), Aflutter, apparently c/o dysarthria, starting yesterday. She has been having intermittent nausea, vomiting, and diarrhea since 05-Mar-2015 when her husband died.  "loose stool almost pure water" 5x yesterday. Pt denies any recent travel or odd foods eaten. Pt presented to ED due to persistent slurred speech and trouble finding the right words. Denied numbness, tingling , weakness (focal), headache, vision change. In ED, CT brain negative. Unable to get MRI due to pacer. Repeat head CT without contrast demonstrated no evidence of acute infarct.  Her labs were consistent with dehydration in the emergency department and rapidly improved with IV fluids.  Her speech has since improved. Awaiting physical therapy evaluation and abdominal ultrasound. Anticipate patient will be ready for discharge on 10/01/2015.   Assessment & Plan:   Active Problems:   Hypothyroidism   Essential hypertension   Dysarthria   Abnormal liver function   Acute renal failure (ARF) (HCC)   Hyponatremia   Dysarthria, likely secondary to dehydration from diuretic use and from nausea, vomiting, and diarrhea. Speech improved with IV fluids. Repeat head CT without contrast demonstrated no evidence of acute infarct.  Echocardiogram demonstrated preserved ejection fraction with no regional wall motion abnormalities. There was some evidence of diastolic dysfunction. She had mild aortic valve regurgitation and no mitral valve stenosis or regurgitation.  Carotid duplex without flow limiting stenosis and there was no evidence of DVT on lower extremity duplex.  Her diuretic was discontinued and she has been on IVF.  Doubt TIA given rapid improvement of her symptoms with IVF.   -  Speech therapy -  PPM interrogation pending -  EEG without  evidence of seizure -  Start aspirin daily  AKI with creatinine of 2 at admission, baseline of 1.  Trended down quickly with IVF and by holding diuretic.  Recommend that she weigh herself daily and if she gains more than 5-lbs from the time of dsicharge that she take a dose of lasix.    Chronic diarrhea (intermittent over the last 9-months).   -  GI pathogen panel -  Celiac panel  Chronic diastolic heart failure -  Hold diuretics  Mild transaminitis, in 3:1 ratio, however, patient denies that she uses alcohol.  May have been elevated simply from dehydration. -  Repeat LFTs today -  Hepatitis panel negative -  RUQ [name] pending  Essential hypertension, blood pressure stable this morning -  Continue coreg  Hyponatremia due to dehydration and resolving with IVF -  Repeat BMP in AM  Normocytic anemia, likely of chronic disease.  Vitamin B12 and TSH are wnl -  Iron studies, folate -  Occult stool -  Repeat hgb in AM  Weight loss of 15-lbs over the last 58-months -  Nutrition consultation -  Regular diet -  Add supplements  Depression, stable, continue cymbalta  Chronic pain, continue oxycodone 15mg .    TSH, stable, continue synthroid and liothyronine  DVT prophylaxis:  heparin Code Status:  full Family Communication:  Patient alone Disposition Plan:  Pending SLP and PT evaluation, RUQ [name], likely to home.     Consultants:   Neurology  Procedures:  EEG  Antimicrobials:   none    Subjective: States she is feeling much better.  She has been having intermittent nausea nad vomiting with watery diarrhea since  her husband died in December.  Seems a little confused during my interview, particularly in regards to the timeframe of her symptoms.    Objective: Filed Vitals:   09/30/15 0500 09/30/15 0644 09/30/15 0944 09/30/15 1336  BP:  162/56 135/50 129/42  Pulse:  71 60 67  Temp:  98.1 F (36.7 C) 98.2 F (36.8 C) 98.2 F (36.8 C)  TempSrc:  Oral Oral Oral  Resp:  18  18 18   Height:      Weight: 54.2 kg (119 lb 7.8 oz)     SpO2:  94% 93% 98%    Intake/Output Summary (Last 24 hours) at 09/30/15 1453 Last data filed at 09/30/15 1300  Gross per 24 hour  Intake    360 ml  Output      0 ml  Net    360 ml   Filed Weights   09/30/15 0500  Weight: 54.2 kg (119 lb 7.8 oz)    Examination:  General exam:  Adult female.  No acute distress.  HEENT:  NCAT, MMM Respiratory system: Clear to auscultation bilaterally Cardiovascular system: paced rhythm. No murmurs, rubs, gallops or clicks.  Warm extremities Gastrointestinal system: Normal active bowel sounds, soft, nondistended, nontender. MSK:  Normal tone and bulk, no lower extremity edema Neuro:  Grossly intact    Data Reviewed: I have personally reviewed following labs and imaging studies  CBC:  Recent Labs Lab 09/29/15 1413 09/29/15 1421 09/30/15 0946  WBC 7.0  --  6.5  NEUTROABS 4.0  --   --   HGB 11.4* 11.6* 11.6*  HCT 32.7* 34.0* 34.9*  MCV 86.3  --  88.1  PLT 148*  --  [id number]*   Basic Metabolic Panel:  Recent Labs Lab 09/29/15 1413 09/29/15 1421 09/30/15 0946  NA 128* 130* 134*  K 4.0 4.1 3.6  CL 97* 96* 108  CO2 24  --  20*  GLUCOSE 120* 117* 108*  BUN 50* 44* 33*  CREATININE 2.04* 2.00* 1.05*  CALCIUM 8.3*  --  8.4*   GFR: Estimated Creatinine Clearance: 28.1 mL/min (by C-G formula based on Cr of 1.05). Liver Function Tests:  Recent Labs Lab 09/29/15 1413  AST 308*  ALT 160*  ALKPHOS 315*  BILITOT 1.2  PROT 6.3*  ALBUMIN 3.2*   No results for input(s): LIPASE, AMYLASE in the last 168 hours. No results for input(s): AMMONIA in the last 168 hours. Coagulation Profile:  Recent Labs Lab 09/29/15 1413  INR 1.22   Cardiac Enzymes: No results for input(s): CKTOTAL, CKMB, CKMBINDEX, TROPONINI in the last 168 hours. BNP (last 3 results) No results for input(s): PROBNP in the last 8760 hours. HbA1C: No results for input(s): HGBA1C in the last 72  hours. CBG: No results for input(s): GLUCAP in the last 168 hours. Lipid Profile:  Recent Labs  09/30/15 0946  CHOL 135  HDL 32*  LDLCALC 80  TRIG 113  CHOLHDL 4.2   Thyroid Function Tests:  Recent Labs  09/30/15 0704  TSH 0.679   Anemia Panel:  Recent Labs  09/30/15 0946  VITAMINB12 826   Urine analysis:    Component Value Date/Time   COLORURINE AMBER* 09/29/2015 1600   APPEARANCEUR CLEAR 09/29/2015 1600   LABSPEC 1.011 09/29/2015 1600   PHURINE 5.5 09/29/2015 1600   GLUCOSEU NEGATIVE 09/29/2015 1600   HGBUR NEGATIVE 09/29/2015 1600   BILIRUBINUR NEGATIVE 09/29/2015 1600   KETONESUR NEGATIVE 09/29/2015 1600   PROTEINUR NEGATIVE 09/29/2015 1600   NITRITE NEGATIVE 09/29/2015 1600  LEUKOCYTESUR NEGATIVE 09/29/2015 1600   Sepsis Labs: @LABRCNTIP (procalcitonin:4,lacticidven:4)  )No results found for this or any previous visit (from the past 240 hour(s)).    Radiology Studies: Ct Head Wo Contrast  09/30/2015  CLINICAL DATA:  Pt admitted for ? Stroke; pt was having trouble "getting her words out"; pt states that she has improved, but is not completely back to her baseline normal. Pt denies h/a, denies any injury EXAM: CT HEAD WITHOUT CONTRAST TECHNIQUE: Contiguous axial images were obtained from the base of the skull through the vertex without intravenous contrast. COMPARISON:  09/29/2015 FINDINGS: The ventricles are normal in configuration. There is age related ventricular and sulcal enlargement, stable. There are no parenchymal masses or mass effect. There is no evidence of a recent cortical infarct. Mild periventricular white matter hypoattenuation noted consistent with chronic microvascular ischemic change. There is a right middle cranial fossa arachnoid cyst, stable. No other extra-axial abnormalities. No intracranial hemorrhage. The visualized sinuses and mastoid air cells are clear. IMPRESSION: 1. No acute intracranial abnormalities. No change from the previous  day's study. Electronically Signed   By: Lajean Manes M.D.   On: 09/30/2015 12:49   Ct Head Wo Contrast  09/29/2015  CLINICAL DATA:  Difficulty forming words since yesterday. EXAM: CT HEAD WITHOUT CONTRAST TECHNIQUE: Contiguous axial images were obtained from the base of the skull through the vertex without intravenous contrast. COMPARISON:  Report from study 02/04/1999.  No images available. FINDINGS: Area of encephalomalacia in the right temporal lobe versus arachnoid cyst in the middle cranial fossa. This was described on prior CT and felt at that time to represent an arachnoid cyst. No hemorrhage or hydrocephalus. No acute infarct or midline shift. No acute calvarial abnormality. Visualized paranasal sinuses and mastoids clear. Orbital soft tissues unremarkable. IMPRESSION: Probable right middle cranial fossa arachnoid cyst versus right temporal lobe encephalomalacia. This finding was described on 2000 CT. No acute intracranial abnormality. Electronically Signed   By: Rolm Baptise M.D.   On: 09/29/2015 15:15     Scheduled Meds: . brimonidine  1 drop Both Eyes Q12H   And  . timolol  1 drop Both Eyes BID  . carvedilol  12.5 mg Oral BID WC  . DULoxetine  30 mg Oral BID  . heparin  5,000 Units Subcutaneous Q8H  . latanoprost  1 drop Both Eyes QHS  . levothyroxine  112 mcg Oral QAC breakfast  . liothyronine  25 mcg Oral Daily  . mometasone-formoterol  2 puff Inhalation BID  . sodium chloride flush  3 mL Intravenous Q12H   Continuous Infusions:    LOS: 1 day    Time spent: 30 min    Janece Canterbury, MD [facility] Pager [phone]  If 7PM-7AM, please contact night-coverage [email or web address] Password [email or web address] 09/30/2015, 2:53 PM

## 2015-09-30 NOTE — Progress Notes (Signed)
STROKE TEAM PROGRESS NOTE   HISTORY OF PRESENT ILLNESS (per record) [name] is a 82 y.o. female with a history of cardiomyopathy who presents with transient aphasia. She states that she has had some difficulty with word finding over the past couple of days. Today, however she had about a 15 minute episode of completely nonsensical speech. Her daughter describes the things that she was saying as "nonsense" without formed words. She may have had some slight dysarthria, but this was not the reason for not being able to understand her. The patient states that she feels like she has had some difficulty with speech even since yesterday (LKW 09/28/2015, time unknown), and that has improved but the episode of most concern 1 lasted 15-20 minutes. Patient was not administered IV t-PA as she was out of the window. She was admitted for further evaluation and treatment.   SUBJECTIVE (INTERVAL HISTORY) Her daughter is at the bedside.  Overall she feels her condition is rapidly improving. Her daughter stated that for the last 3 days she has intermittent word finding difficulties. No facial droop, no weakness, no LOC, no seizure, no HA. Pt stated that for the last 6 months since her husband passed away, she did not eat or drink well. She continued to use lasix however. She has hyponatremia, AKI and elevated LFT on admission yesterday but today those are near normalized after IVF.    OBJECTIVE Temp:  [98.1 F (36.7 C)-98.5 F (36.9 C)] 98.2 F (36.8 C) (07/04 1336) Pulse Rate:  [60-75] 67 (07/04 1336) Cardiac Rhythm:  [-] Normal sinus rhythm (07/04 0802) Resp:  [16-22] 18 (07/04 1336) BP: (129-162)/(37-95) 129/42 mmHg (07/04 1336) SpO2:  [93 %-98 %] 98 % (07/04 1336) Weight:  [54.2 kg (119 lb 7.8 oz)] 54.2 kg (119 lb 7.8 oz) (07/04 0500)  CBC:   Recent Labs Lab 09/29/15 1413 09/29/15 1421 09/30/15 0946  WBC 7.0  --  6.5  NEUTROABS 4.0  --   --   HGB 11.4* 11.6* 11.6*  HCT 32.7* 34.0* 34.9*  MCV  86.3  --  88.1  PLT 148*  --  145*    Basic Metabolic Panel:   Recent Labs Lab 09/29/15 1413 09/29/15 1421 09/30/15 0946  NA 128* 130* 134*  K 4.0 4.1 3.6  CL 97* 96* 108  CO2 24  --  20*  GLUCOSE 120* 117* 108*  BUN 50* 44* 33*  CREATININE 2.04* 2.00* 1.05*  CALCIUM 8.3*  --  8.4*    Lipid Panel:     Component Value Date/Time   CHOL 135 09/30/2015 0946   TRIG 113 09/30/2015 0946   HDL 32* 09/30/2015 0946   CHOLHDL 4.2 09/30/2015 0946   VLDL 23 09/30/2015 0946   LDLCALC 80 09/30/2015 0946   HgbA1c:  Lab Results  Component Value Date   HGBA1C 5.9* 03/06/2013   Urine Drug Screen:     Component Value Date/Time   LABOPIA POSITIVE* 09/29/2015 1600   COCAINSCRNUR NONE DETECTED 09/29/2015 1600   LABBENZ NONE DETECTED 09/29/2015 1600   AMPHETMU NONE DETECTED 09/29/2015 1600   THCU NONE DETECTED 09/29/2015 1600   LABBARB NONE DETECTED 09/29/2015 1600      IMAGING I have personally reviewed the radiological images below and agree with the radiology interpretations.  Ct Head Wo Contrast 09/30/2015  1. No acute intracranial abnormalities. No change from the previous day's study.  09/29/2015  Probable right middle cranial fossa arachnoid cyst versus right temporal lobe encephalomalacia. This finding was  described on 2000 CT. No acute intracranial abnormality.   Carotid Doppler   There is 1-39% bilateral ICA stenosis. Vertebral artery flow is antegrade.   Bilateral Lower Extremity Dopplers negative for deep vein thrombosis. There is no evidence of Baker's cyst bilaterally.  2D Echocardiogram  - Left ventricle: The cavity size was normal. There was moderate concentric hypertrophy. Systolic function was vigorous. The estimated ejection fraction was in the range of 65% to 70%. Wall motion was normal; there were no regional wall motion abnormalities. Abnormal relaxation with increased filling pressures. - Aortic valve: There was mild regurgitation. - Mitral valve: Severely  calcified annulus. Mildly thickened leaflets . Valve area by continuity equation (using LVOT flow): 2.21 cm^2. - Left atrium: The atrium was severely dilated.  EEG - This is an abnormal EEG due to mild diffuse generalized slowing. This is nonspecific and suggests a global encephalopathic process. Clinical correlation required.   TCD pending   PHYSICAL EXAM  Temp:  [98.1 F (36.7 C)-98.5 F (36.9 C)] 98.2 F (36.8 C) (07/04 1336) Pulse Rate:  [60-75] 67 (07/04 1336) Resp:  [16-21] 18 (07/04 1336) BP: (129-162)/(37-95) 129/42 mmHg (07/04 1336) SpO2:  [93 %-98 %] 98 % (07/04 1336) Weight:  [119 lb 7.8 oz (54.2 kg)] 119 lb 7.8 oz (54.2 kg) (07/04 0500)  General - Well nourished, well developed, in no apparent distress.  Ophthalmologic - Sharp disc margins OU.   Cardiovascular - Regular rate and rhythm.  Mental Status -  Level of arousal and orientation to time, place, and person were intact. Language including expression, naming, repetition, comprehension was assessed and found intact. Fund of Knowledge was assessed and was intact.  Cranial Nerves II - XII - II - Visual field intact OU. III, IV, VI - Extraocular movements intact. V - Facial sensation intact bilaterally. VII - Facial movement intact bilaterally. VIII - Hearing & vestibular intact bilaterally. X - Palate elevates symmetrically. XI - Chin turning & shoulder shrug intact bilaterally. XII - Tongue protrusion intact.  Motor Strength - The patient's strength was normal in all extremities and pronator drift was absent.  Bulk was normal and fasciculations were absent.   Motor Tone - Muscle tone was assessed at the neck and appendages and was normal.  Reflexes - The patient's reflexes were 1+ in all extremities and she had no pathological reflexes.  Sensory - Light touch, temperature/pinprick were assessed and were symmetrical.    Coordination - The patient had normal movements in the hands with no ataxia or  dysmetria.  Tremor was absent.  Gait and Station - deferred.   ASSESSMENT/PLAN Ms. [name] is a 82 y.o. female with history of HTN,  Cardiomyopathy, A Flutter, diarrhea with n/v and ovarian cancer presenting with transient aphasia. She did not receive IV t-PA due to being out of the window.   Speech difficulty likely due to acute encephalopathy secondary to hyponatremia, AKI, dehydration. Stroke or TIA less likely  Resultant  Deficit resolved  Pacer interrogation requested  Repeat CT head negative for stroke  Carotid Doppler  No significant stenosis   2D Echo  EF 65-70%  LE dopplers negative  TCD pending  EEG mild diffuse slowing  LDL 80  HgbA1c pending  Heparin 5000 units sq tid for VTE prophylaxis Diet Heart Room service appropriate?: Yes; Fluid consistency:: Thin  No antithrombotic prior to admission, now on ASA 325mg   Patient counseled to be compliant with her antithrombotic medications  Ongoing aggressive stroke risk factor management  Therapy  recommendations:  pending   Disposition:  Pending  Dehydration and AKI  On IVF  Chronic N/V/diarrhea - undergoing work up  Na 128->134  Cre 2.04->1.05  Pt is encouraged to increase po intake  Hypertension  Stable  BP goal normotensive  Other Stroke Risk Factors  Advanced age  Other Active Problems  Stage IV ovarian cancer - cured 16 years ago as per pt and daughter  elevated LFT  Anemia  Hospital day # 1  Rosalin Hawking, MD PhD Stroke Neurology 09/30/2015 4:23 PM      To contact Stroke Continuity provider, please refer to [email or web address] After hours, contact General Neurology

## 2015-09-30 NOTE — Progress Notes (Signed)
Echocardiogram 2D Echocardiogram has been performed.  Joelene Millin 09/30/2015, 11:15 AM

## 2015-09-30 NOTE — Progress Notes (Signed)
*  PRELIMINARY RESULTS* Vascular Ultrasound Carotid Duplex (Doppler) has been completed.   Findings suggest 1-39% internal carotid artery stenosis bilaterally. Vertebral arteries are patent with antegrade flow.   Bilateral lower extremity venous duplex completed. Bilateral lower extremities are negative for deep vein thrombosis. There is no evidence of Baker's cyst bilaterally.   Transcranial Doppler has been completed.   09/30/2015 12:23 PM Maudry Mayhew, RVT, RDCS, RDMS

## 2015-09-30 NOTE — Progress Notes (Signed)
Patient and patients daughter stated that the patient had diarrhea Friday night every 2 hours and into Saturday morning when she began to have N/V as well. Daughter states she stayed in the bathroom with vomiting and diarrhea until early afternoon and then felt better. Sunday night she had a very small soft stool but hasn't had any more N/V/diarrhea since Saturday 09/27/2015. Will notify on call provider to D/C enteric precautions and C-diff PCR testing.

## 2015-09-30 NOTE — Progress Notes (Signed)
EEG completed; results pending.    

## 2015-10-01 ENCOUNTER — Inpatient Hospital Stay (HOSPITAL_COMMUNITY): Payer: Medicare Other

## 2015-10-01 LAB — COMPREHENSIVE METABOLIC PANEL
ALBUMIN: 2.4 g/dL — AB (ref 3.5–5.0)
ALK PHOS: 210 U/L — AB (ref 38–126)
ALT: 62 U/L — AB (ref 14–54)
ANION GAP: 5 (ref 5–15)
AST: 37 U/L (ref 15–41)
BILIRUBIN TOTAL: 0.5 mg/dL (ref 0.3–1.2)
BUN: 24 mg/dL — ABNORMAL HIGH (ref 6–20)
CHLORIDE: 108 mmol/L (ref 101–111)
CO2: 21 mmol/L — ABNORMAL LOW (ref 22–32)
CREATININE: 0.79 mg/dL (ref 0.44–1.00)
Calcium: 8.1 mg/dL — ABNORMAL LOW (ref 8.9–10.3)
GFR calc Af Amer: >60 mL/min (ref >60)
GFR calc non Af Amer: >60 mL/min (ref >60)
Glucose, Bld: 118 mg/dL — ABNORMAL HIGH (ref 65–99)
Potassium: 3.5 mmol/L (ref 3.5–5.1)
SODIUM: 134 mmol/L — AB (ref 135–145)
TOTAL PROTEIN: 5.3 g/dL — AB (ref 6.5–8.1)

## 2015-10-01 LAB — IRON AND TIBC
IRON: 29 ug/dL (ref 28–170)
Saturation Ratios: 14 % (ref 10.4–31.8)
TIBC: 204 ug/dL — ABNORMAL LOW (ref 250–450)
UIBC: 175 ug/dL

## 2015-10-01 LAB — FERRITIN: Ferritin: 156 ng/mL (ref 11–307)

## 2015-10-01 LAB — GLIADIN ANTIBODIES, SERUM
GLIADIN IGA: 4 U (ref 0–19)
Gliadin IgG: 2 units (ref 0–19)

## 2015-10-01 LAB — FOLATE: Folate: 12.8 ng/mL (ref >5.9)

## 2015-10-01 LAB — CBC
HCT: 31.3 % — ABNORMAL LOW (ref 36.0–46.0)
Hemoglobin: 10.3 g/dL — ABNORMAL LOW (ref 12.0–15.0)
MCH: 29 pg (ref 26.0–34.0)
MCHC: 32.9 g/dL (ref 30.0–36.0)
MCV: 88.2 fL (ref 78.0–100.0)
PLATELETS: 155 10*3/uL (ref 150–400)
RBC: 3.55 MIL/uL — AB (ref 3.87–5.11)
RDW: 12 % (ref 11.5–15.5)
WBC: 7.4 10*3/uL (ref 4.0–10.5)

## 2015-10-01 LAB — CALCIUM / CREATININE RATIO, URINE
CALCIUM/CREAT. RATIO: 15 mg/g{creat} (ref 0–260)
CREATININE, UR: 60.8 mg/dL
Calcium, Ur: 0.9 mg/dL

## 2015-10-01 LAB — HEMOGLOBIN A1C
HEMOGLOBIN A1C: 5.4 % (ref 4.8–5.6)
Mean Plasma Glucose: 108 mg/dL

## 2015-10-01 MED ORDER — ASPIRIN 325 MG PO TBEC: 325.0000 mg | DELAYED_RELEASE_TABLET | Freq: Every day | ORAL | Status: DC

## 2015-10-01 MED ORDER — ENSURE ENLIVE PO LIQD: 237.0000 mL | Freq: Two times a day (BID) | ORAL | Status: DC

## 2015-10-01 NOTE — Progress Notes (Signed)
STROKE TEAM PROGRESS NOTE   SUBJECTIVE (INTERVAL HISTORY) Her daughter is at the bedside.  She feel her speech even better today than yesterday, now back to baseline. Her Cre, Na, AST and ALT further improved. Pacer interrogation pending.   OBJECTIVE Temp:  [97.7 F (36.5 C)-98.9 F (37.2 C)] 98.2 F (36.8 C) (07/05 1401) Pulse Rate:  [54-68] 65 (07/05 1401) Cardiac Rhythm:  [-] A-V Sequential paced;Bundle branch block (07/05 0753) Resp:  [16-18] 18 (07/05 1401) BP: (135-167)/(48-62) 163/61 mmHg (07/05 1401) SpO2:  [95 %-99 %] 96 % (07/05 1401) Weight:  [117 lb 4.6 oz (53.2 kg)] 117 lb 4.6 oz (53.2 kg) (07/05 0500)  CBC:   Recent Labs Lab 09/29/15 1413  09/30/15 0946 10/01/15 0214  WBC 7.0  --  6.5 7.4  NEUTROABS 4.0  --   --   --   HGB 11.4*  < > 11.6* 10.3*  HCT 32.7*  < > 34.9* 31.3*  MCV 86.3  --  88.1 88.2  PLT 148*  --  145* 155  < > = values in this interval not displayed.  Basic Metabolic Panel:   Recent Labs Lab 09/30/15 0946 10/01/15 0214  NA 134* 134*  K 3.6 3.5  CL 108 108  CO2 20* 21*  GLUCOSE 108* 118*  BUN 33* 24*  CREATININE 1.05* 0.79  CALCIUM 8.4* 8.1*    Lipid Panel:     Component Value Date/Time   CHOL 135 09/30/2015 0946   TRIG 113 09/30/2015 0946   HDL 32* 09/30/2015 0946   CHOLHDL 4.2 09/30/2015 0946   VLDL 23 09/30/2015 0946   LDLCALC 80 09/30/2015 0946   HgbA1c:  Lab Results  Component Value Date   HGBA1C 5.4 09/30/2015   Urine Drug Screen:     Component Value Date/Time   LABOPIA POSITIVE* 09/29/2015 1600   COCAINSCRNUR NONE DETECTED 09/29/2015 1600   LABBENZ NONE DETECTED 09/29/2015 1600   AMPHETMU NONE DETECTED 09/29/2015 1600   THCU NONE DETECTED 09/29/2015 1600   LABBARB NONE DETECTED 09/29/2015 1600      IMAGING I have personally reviewed the radiological images below and agree with the radiology interpretations.  Ct Head Wo Contrast 09/30/2015  1. No acute intracranial abnormalities. No change from the  previous day's study.  09/29/2015  Probable right middle cranial fossa arachnoid cyst versus right temporal lobe encephalomalacia. This finding was described on 2000 CT. No acute intracranial abnormality.   Carotid Doppler   There is 1-39% bilateral ICA stenosis. Vertebral artery flow is antegrade.   Bilateral Lower Extremity Dopplers negative for deep vein thrombosis. There is no evidence of Baker's cyst bilaterally.  2D Echocardiogram  - Left ventricle: The cavity size was normal. There was moderate concentric hypertrophy. Systolic function was vigorous. The estimated ejection fraction was in the range of 65% to 70%. Wall motion was normal; there were no regional wall motion abnormalities. Abnormal relaxation with increased filling pressures. - Aortic valve: There was mild regurgitation. - Mitral valve: Severely calcified annulus. Mildly thickened leaflets . Valve area by continuity equation (using LVOT flow): 2.21 cm^2. - Left atrium: The atrium was severely dilated.  EEG - This is an abnormal EEG due to mild diffuse generalized slowing. This is nonspecific and suggests a global encephalopathic process. Clinical correlation required.   TCD pending   PHYSICAL EXAM  Temp:  [97.7 F (36.5 C)-98.9 F (37.2 C)] 98.2 F (36.8 C) (07/05 1401) Pulse Rate:  [54-68] 65 (07/05 1401) Resp:  [16-18] 18 (07/05 1401)  BP: (135-167)/(48-62) 163/61 mmHg (07/05 1401) SpO2:  [95 %-99 %] 96 % (07/05 1401) Weight:  [117 lb 4.6 oz (53.2 kg)] 117 lb 4.6 oz (53.2 kg) (07/05 0500)  General - Well nourished, well developed, in no apparent distress.  Ophthalmologic - Sharp disc margins OU.   Cardiovascular - Regular rate and rhythm.  Mental Status -  Level of arousal and orientation to time, place, and person were intact. Language including expression, naming, repetition, comprehension was assessed and found intact. Fund of Knowledge was assessed and was intact.  Cranial Nerves II - XII - II -  Visual field intact OU. III, IV, VI - Extraocular movements intact. V - Facial sensation intact bilaterally. VII - Facial movement intact bilaterally. VIII - Hearing & vestibular intact bilaterally. X - Palate elevates symmetrically. XI - Chin turning & shoulder shrug intact bilaterally. XII - Tongue protrusion intact.  Motor Strength - The patient's strength was normal in all extremities and pronator drift was absent.  Bulk was normal and fasciculations were absent.   Motor Tone - Muscle tone was assessed at the neck and appendages and was normal.  Reflexes - The patient's reflexes were 1+ in all extremities and she had no pathological reflexes.  Sensory - Light touch, temperature/pinprick were assessed and were symmetrical.    Coordination - The patient had normal movements in the hands with no ataxia or dysmetria.  Tremor was absent.  Gait and Station - deferred.   ASSESSMENT/PLAN Ms. [name] is a 82 y.o. female with history of HTN,  Cardiomyopathy, A Flutter, diarrhea with n/v and ovarian cancer presenting with transient aphasia. She did not receive IV t-PA due to being out of the window.   Speech difficulty likely due to acute encephalopathy secondary to hyponatremia, AKI, liver dysfunction, and dehydration. Stroke or TIA less likely  Resultant  Deficit resolved  Pacer interrogation requested  Repeat CT head negative for stroke  Carotid Doppler  No significant stenosis   2D Echo  EF 65-70%  LE dopplers negative  TCD pending  EEG mild diffuse slowing, no seizure  LDL 80  HgbA1c 5.4  Heparin 5000 units sq tid for VTE prophylaxis Diet regular Room service appropriate?: Yes; Fluid consistency:: Thin Diet - low sodium heart healthy  No antithrombotic prior to admission, now on ASA 325mg   Patient counseled to be compliant with her antithrombotic medications  Ongoing aggressive stroke risk factor management  Therapy recommendations:  Not reqired    Disposition:  home  Dehydration and AKI  Off IVF  Chronic N/V/diarrhea - undergoing work up  Na 128->134->134  Cre 2.04->1.05->0.79  AST and ALT nomalized  Pt is encouraged to increase po intake  Hypertension  Stable  BP goal normotensive  Other Stroke Risk Factors  Advanced age  Other Active Problems  Stage IV ovarian cancer - cured 16 years ago as per pt and daughter  elevated LFT - nomalized  Anemia  Hospital day # 2  Neurology will sign off. Please call with questions. No neuro follow up needed at this time. Thanks for the consult.   Rosalin Hawking, MD PhD Stroke Neurology 10/01/2015 4:58 PM      To contact Stroke Continuity provider, please refer to [email or web address] After hours, contact General Neurology

## 2015-10-01 NOTE — Progress Notes (Signed)
PT Cancellation Note  Patient Details Name: [name] MRN: [id number] DOB: Sep 12, 1928   Cancelled Treatment:    Reason Eval/Treat Not Completed: Patient declined, no reason specified.  I don't need therapy.  Pt declined PT intervention. 10/01/2015  Donnella Sham, [place] [phone]  (pager)   [name], [name] 10/01/2015, 3:25 PM

## 2015-10-01 NOTE — Progress Notes (Signed)
Initial Nutrition Assessment  DOCUMENTATION CODES:   Non-severe (moderate) malnutrition in context of social or environmental circumstances  INTERVENTION:  Continue Ensure Enlive po BID, each supplement provides 350 kcal and 20 grams of protein.  Encourage adequate PO intake.   NUTRITION DIAGNOSIS:   Malnutrition related to social / environmental circumstances as evidenced by energy intake < 75% for > or equal to 3 months, moderate depletions of muscle mass.  GOAL:   Patient will meet greater than or equal to 90% of their needs  MONITOR:   PO intake, Supplement acceptance, Weight trends, Labs, I & O's  REASON FOR ASSESSMENT:   Malnutrition Screening Tool    ASSESSMENT:   82 y.o. female, w hx of Hypertension, CHF (EF 55%), Aflutter, apparently c/o dysarthria, starting yesterday. She has been having intermittent nausea, vomiting, and diarrhea since 2015-03-10 when her husband died. "loose stool almost pure water" 5x yesterday. Pt with AKI.  Pt reports having a lack of appetite since 03-10-2015. Pt reports since her husband died, she says she had no will to eat. Pt reports po intake was little to none. Pt however reports currently that once she gets back home she will eat and drink again as she was starting to feel better. Current meal completion has been 95-100%. Pt reports weight loss. Per Epic weight records, pt with a 12.3% weight loss in 7 months. Pt currently has Ensure ordered and has been consuming them. RD to continue with current orders.   Nutrition-Focused physical exam completed. Findings are no fat depletion, moderate muscle depletion, and no edema.   Labs and medications reviewed.   Diet Order:  Diet regular Room service appropriate?: Yes; Fluid consistency:: Thin  Skin:  Reviewed, no issues  Last BM:  7/5  Height:   Ht Readings from Last 1 Encounters:  09/29/15 5' (1.524 m)    Weight:   Wt Readings from Last 1 Encounters:  10/01/15 117 lb 4.6  oz (53.2 kg)    Ideal Body Weight:  45.45 kg  BMI:  Body mass index is 22.91 kg/(m^2).  Estimated Nutritional Needs:   Kcal:  1450-1650  Protein:  60-70 grams  Fluid:  >/= 1.5 L/day  EDUCATION NEEDS:   No education needs identified at this time  Corrin Parker, MS, RD, LDN Pager # [phone] After hours/ weekend pager # [phone]

## 2015-10-01 NOTE — Progress Notes (Signed)
Pt for discharge home today. Discharge orders received. IV and telemetry dcd. Discharge instructions and prescriptions given with verbalized understanding. Stroke prevention handouts given. Family at bedside to assist with discharge. Staff brought patient to lobby via wheelchair at this time. Transported to home by family member.

## 2015-10-01 NOTE — Discharge Summary (Signed)
Physician Discharge Summary  [name] [id number] DOB: 1928-07-12 DOA: 09/29/2015  PCP: Betty Martinique, MD  Admit date: 09/29/2015 Discharge date: 10/01/2015  Admitted From:  Home Disposition:  Home  Recommendations for Outpatient Follow-up:  1. Follow up with PCP in 1-2 weeks 2. Please obtain BMP/CBC in one week 3. Please follow up on the following pending results:  Home Health:NO Equipment/Devices:None--pt refuses to wait for PT eval--wants to go home ASAP  Discharge Condition:stable CODE STATUS:FULL Diet recommendation: Heart Healthy   Brief/Interim Summary: [name] is a 82 y.o. female, w hx of Hypertension, CHF (EF 55%), Aflutter, apparently c/o dysarthria, starting yesterday. She has been having intermittent nausea, vomiting, and diarrhea since 15-Mar-2015 when her husband died. "loose stool almost pure water" 5x yesterday. Pt denies any recent travel or odd foods eaten. Pt presented to ED due to persistent slurred speech and trouble finding the right words. Denied numbness, tingling , weakness (focal), headache, vision change. In ED, CT brain negative. Unable to get MRI due to pacer. Repeat head CT without contrast demonstrated no evidence of acute infarct. Her labs were consistent with dehydration in the emergency department and rapidly improved with IV fluids. Her speech has since improved. Awaiting physical therapy evaluation and abdominal ultrasound.  Discharge Diagnoses:  Dysarthria/Dysphasia -secondary to dehydration from diuretic use and from nausea, vomiting, and diarrhea. Speech improved with IV fluids. Repeat head CT without contrast demonstrated no evidence of acute infarct. Echocardiogram demonstrated preserved ejection fraction with no regional wall motion abnormalities. There was some evidence of diastolic dysfunction. She had mild aortic valve regurgitation and no mitral valve stenosis or regurgitation. Carotid duplex without flow limiting  stenosis and there was no evidence of DVT on lower extremity duplex. Her diuretic was discontinued and she has been on IVF. Doubt TIA given rapid improvement of her symptoms with IVF.  - Speech therapy--pt refuses--did not want to wait for evaluation - PPM interrogation pending--pt refuses--did not want to wait for evaluation - EEG without evidence of seizure - Start aspirin daily 325 mg daily -09/30/15 venous duplex--neg for DVT  AKI  -with creatinine of 2 at admission, baseline of 0.7-1.0. Trended down quickly with IVF and by holding diuretic. Recommend that she weigh herself daily and if she gains more than 5-lbs from the time of dsicharge that she take a dose of lasix.  -restart home dose lasix after d/c -held losartan during hospitalization--resume after d/c -recommend check BMP in one week  Chronic diarrhea (intermittent over the last 2-months).  - GI pathogen panel--no further diarrhea to collect - Celiac panel--neg antigliadin ab - follow up on remainder of gluten enteropathy panel  Chronic diastolic heart failure - Hold diuretics -restart after d/c -stable  Mild transaminitis, in 3:1 ratio, however, patient denies that she uses alcohol. May have been elevated simply from dehydration. - Repeat LFTs--improving after IVF hydration - Hepatitis panel negative - Abd US-R- hydronephrosis-->offered urology consult--pt refuses to wait for inpt eval--daughter states she can get appt with Dr. Gaynelle Arabian  Essential hypertension, blood pressure stable this morning - Continue coreg  Hyponatremia due to dehydration and resolving with IVF - Repeat BMP in one week  Normocytic anemia, likely of chronic disease. Vitamin B12 and TSH are wnl - Iron saturation 14%, ferritin 156 - Occult stool - Repeat hgb--stable -follow up with PCP  Weight loss of 15-lbs over the last 57-months - Nutrition consultation - Regular diet - Add supplements  Depression, stable,  continue cymbalta  Chronic pain, continue oxycodone  15mg .   TSH, stable, continue synthroid and liothyronine   Discharge Instructions      Discharge Instructions    Diet - low sodium heart healthy    Complete by:  As directed      Increase activity slowly    Complete by:  As directed             Medication List    TAKE these medications        alendronate 70 MG tablet  Commonly known as:  FOSAMAX  Take 70 mg by mouth once a week. Sundays     aspirin 325 MG EC tablet  Take 1 tablet (325 mg total) by mouth daily.     calcium-vitamin D 250-125 MG-UNIT tablet  Commonly known as:  OSCAL WITH D  Take 2 tablets by mouth daily.     carvedilol 12.5 MG tablet  Commonly known as:  COREG  Take 1 tablet (12.5 mg total) by mouth 2 (two) times daily with a meal.     co-enzyme Q-10 50 MG capsule  Take 50 mg by mouth daily. Reported on 09/29/2015     COMBIGAN 0.2-0.5 % ophthalmic solution  Generic drug:  brimonidine-timolol  Place 1 drop into both eyes every 12 (twelve) hours.     digoxin 0.25 MG tablet  Commonly known as:  LANOXIN  TAKE 1 TABLET DAILY     DULERA 200-5 MCG/ACT Aero  Generic drug:  mometasone-formoterol  Inhale 2 puffs into the lungs 2 (two) times daily.     DULoxetine 30 MG capsule  Commonly known as:  CYMBALTA  Take 30 mg by mouth 2 (two) times daily.     feeding supplement (ENSURE ENLIVE) Liqd  Take 237 mLs by mouth 2 (two) times daily between meals.     furosemide 20 MG tablet  Commonly known as:  LASIX  TAKE ONE-HALF (1/2) TABLET DAILY     latanoprost 0.005 % ophthalmic solution  Commonly known as:  XALATAN  Place 1 drop into both eyes at bedtime.     liothyronine 25 MCG tablet  Commonly known as:  CYTOMEL  Take 25 mcg by mouth daily.     losartan 100 MG tablet  Commonly known as:  COZAAR  TAKE 1 TABLET DAILY     oxyCODONE 15 MG immediate release tablet  Commonly known as:  ROXICODONE  Take 15 mg by mouth every 6 (six) hours as needed  for pain.     SYNTHROID 112 MCG tablet  Generic drug:  levothyroxine  Take 112 mcg by mouth daily before breakfast.        No Known Allergies  Consultations:  Neurology   Procedures/Studies: Ct Head Wo Contrast  09/30/2015  CLINICAL DATA:  Pt admitted for ? Stroke; pt was having trouble "getting her words out"; pt states that she has improved, but is not completely back to her baseline normal. Pt denies h/a, denies any injury EXAM: CT HEAD WITHOUT CONTRAST TECHNIQUE: Contiguous axial images were obtained from the base of the skull through the vertex without intravenous contrast. COMPARISON:  09/29/2015 FINDINGS: The ventricles are normal in configuration. There is age related ventricular and sulcal enlargement, stable. There are no parenchymal masses or mass effect. There is no evidence of a recent cortical infarct. Mild periventricular white matter hypoattenuation noted consistent with chronic microvascular ischemic change. There is a right middle cranial fossa arachnoid cyst, stable. No other extra-axial abnormalities. No intracranial hemorrhage. The visualized sinuses and mastoid air cells are clear.  IMPRESSION: 1. No acute intracranial abnormalities. No change from the previous day's study. Electronically Signed   By: Lajean Manes M.D.   On: 09/30/2015 12:49   Ct Head Wo Contrast  09/29/2015  CLINICAL DATA:  Difficulty forming words since yesterday. EXAM: CT HEAD WITHOUT CONTRAST TECHNIQUE: Contiguous axial images were obtained from the base of the skull through the vertex without intravenous contrast. COMPARISON:  Report from study 02/04/1999.  No images available. FINDINGS: Area of encephalomalacia in the right temporal lobe versus arachnoid cyst in the middle cranial fossa. This was described on prior CT and felt at that time to represent an arachnoid cyst. No hemorrhage or hydrocephalus. No acute infarct or midline shift. No acute calvarial abnormality. Visualized paranasal sinuses and  mastoids clear. Orbital soft tissues unremarkable. IMPRESSION: Probable right middle cranial fossa arachnoid cyst versus right temporal lobe encephalomalacia. This finding was described on 2000 CT. No acute intracranial abnormality. Electronically Signed   By: Rolm Baptise M.D.   On: 09/29/2015 15:15   US Abdomen Complete  10/01/2015  CLINICAL DATA:  Increased LFTs EXAM: ABDOMEN ULTRASOUND COMPLETE COMPARISON:  CT abdomen and pelvis 05/17/2015 FINDINGS: Gallbladder: Surgically absent Common bile duct: Diameter: 5.5 mm in diameter within normal limb Liver: Mild intrahepatic biliary ductal dilatation probable postcholecystectomy. There is normal liver echogenicity. No focal mass. IVC: No abnormality visualized. Pancreas: Visualized portion unremarkable. Spleen: Size and appearance within normal limits. Measures 8.8 cm in length Right Kidney: Length: 13.4 cm. There is moderate hydronephrosis. No renal calculi. No focal mass. Left Kidney: Length: 8.9 cm. Echogenicity within normal limits. No mass or hydronephrosis visualized. Abdominal aorta: No aneurysm visualized. Measures up to 2.8 cm in diameter. Atherosclerotic plaques abdominal aorta. Other findings: None. IMPRESSION: 1. Surgical absent gallbladder.  Normal CBD. 2. Mild intrahepatic biliary ductal dilatation probable postcholecystectomy. 3. Moderate right hydronephrosis. No renal calculi. No left hydronephrosis 4. No aortic aneurysm. Electronically Signed   By: Lahoma Crocker M.D.   On: 10/01/2015 08:13        Discharge Exam: Filed Vitals:   10/01/15 0902 10/01/15 1401  BP: 167/60 163/61  Pulse: 68 65  Temp: 98 F (36.7 C) 98.2 F (36.8 C)  Resp: 16 18   Filed Vitals:   10/01/15 0616 10/01/15 0753 10/01/15 0902 10/01/15 1401  BP: 149/54 149/62 167/60 163/61  Pulse: 54 64 68 65  Temp: 98.5 F (36.9 C) 98.1 F (36.7 C) 98 F (36.7 C) 98.2 F (36.8 C)  TempSrc: Oral Oral Oral Oral  Resp: 18 16 16 18   Height:      Weight:      SpO2: 95% 97%  95% 96%    General: Pt is alert, awake, not in acute distress Cardiovascular: RRR, S1/S2 +, no rubs, no gallops Respiratory: CTA bilaterally, no wheezing, no rhonchi Abdominal: Soft, NT, ND, bowel sounds + Extremities: trace LE edema, no cyanosis   The results of significant diagnostics from this hospitalization (including imaging, microbiology, ancillary and laboratory) are listed below for reference.    Significant Diagnostic Studies: Ct Head Wo Contrast  09/30/2015  CLINICAL DATA:  Pt admitted for ? Stroke; pt was having trouble "getting her words out"; pt states that she has improved, but is not completely back to her baseline normal. Pt denies h/a, denies any injury EXAM: CT HEAD WITHOUT CONTRAST TECHNIQUE: Contiguous axial images were obtained from the base of the skull through the vertex without intravenous contrast. COMPARISON:  09/29/2015 FINDINGS: The ventricles are normal in configuration. There is  age related ventricular and sulcal enlargement, stable. There are no parenchymal masses or mass effect. There is no evidence of a recent cortical infarct. Mild periventricular white matter hypoattenuation noted consistent with chronic microvascular ischemic change. There is a right middle cranial fossa arachnoid cyst, stable. No other extra-axial abnormalities. No intracranial hemorrhage. The visualized sinuses and mastoid air cells are clear. IMPRESSION: 1. No acute intracranial abnormalities. No change from the previous day's study. Electronically Signed   By: Lajean Manes M.D.   On: 09/30/2015 12:49   Ct Head Wo Contrast  09/29/2015  CLINICAL DATA:  Difficulty forming words since yesterday. EXAM: CT HEAD WITHOUT CONTRAST TECHNIQUE: Contiguous axial images were obtained from the base of the skull through the vertex without intravenous contrast. COMPARISON:  Report from study 02/04/1999.  No images available. FINDINGS: Area of encephalomalacia in the right temporal lobe versus arachnoid cyst in  the middle cranial fossa. This was described on prior CT and felt at that time to represent an arachnoid cyst. No hemorrhage or hydrocephalus. No acute infarct or midline shift. No acute calvarial abnormality. Visualized paranasal sinuses and mastoids clear. Orbital soft tissues unremarkable. IMPRESSION: Probable right middle cranial fossa arachnoid cyst versus right temporal lobe encephalomalacia. This finding was described on 2000 CT. No acute intracranial abnormality. Electronically Signed   By: Rolm Baptise M.D.   On: 09/29/2015 15:15   US Abdomen Complete  10/01/2015  CLINICAL DATA:  Increased LFTs EXAM: ABDOMEN ULTRASOUND COMPLETE COMPARISON:  CT abdomen and pelvis 05/17/2015 FINDINGS: Gallbladder: Surgically absent Common bile duct: Diameter: 5.5 mm in diameter within normal limb Liver: Mild intrahepatic biliary ductal dilatation probable postcholecystectomy. There is normal liver echogenicity. No focal mass. IVC: No abnormality visualized. Pancreas: Visualized portion unremarkable. Spleen: Size and appearance within normal limits. Measures 8.8 cm in length Right Kidney: Length: 13.4 cm. There is moderate hydronephrosis. No renal calculi. No focal mass. Left Kidney: Length: 8.9 cm. Echogenicity within normal limits. No mass or hydronephrosis visualized. Abdominal aorta: No aneurysm visualized. Measures up to 2.8 cm in diameter. Atherosclerotic plaques abdominal aorta. Other findings: None. IMPRESSION: 1. Surgical absent gallbladder.  Normal CBD. 2. Mild intrahepatic biliary ductal dilatation probable postcholecystectomy. 3. Moderate right hydronephrosis. No renal calculi. No left hydronephrosis 4. No aortic aneurysm. Electronically Signed   By: Lahoma Crocker M.D.   On: 10/01/2015 08:13     Microbiology: No results found for this or any previous visit (from the past 240 hour(s)).   Labs: Basic Metabolic Panel:  Recent Labs Lab 09/29/15 1413 09/29/15 1421 09/30/15 0946 10/01/15 0214  NA 128* 130*  134* 134*  K 4.0 4.1 3.6 3.5  CL 97* 96* 108 108  CO2 24  --  20* 21*  GLUCOSE 120* 117* 108* 118*  BUN 50* 44* 33* 24*  CREATININE 2.04* 2.00* 1.05* 0.79  CALCIUM 8.3*  --  8.4* 8.1*   Liver Function Tests:  Recent Labs Lab 09/29/15 1413 09/30/15 0946 10/01/15 0214  AST 308* 90* 37  ALT 160* 95* 62*  ALKPHOS 315* 254* 210*  BILITOT 1.2 0.2* 0.5  PROT 6.3* 5.6* 5.3*  ALBUMIN 3.2* 2.7* 2.4*   No results for input(s): LIPASE, AMYLASE in the last 168 hours. No results for input(s): AMMONIA in the last 168 hours. CBC:  Recent Labs Lab 09/29/15 1413 09/29/15 1421 09/30/15 0946 10/01/15 0214  WBC 7.0  --  6.5 7.4  NEUTROABS 4.0  --   --   --   HGB 11.4* 11.6* 11.6* 10.3*  HCT 32.7* 34.0* 34.9* 31.3*  MCV 86.3  --  88.1 88.2  PLT 148*  --  145* 155   Cardiac Enzymes: No results for input(s): CKTOTAL, CKMB, CKMBINDEX, TROPONINI in the last 168 hours. BNP: Invalid input(s): POCBNP CBG: No results for input(s): GLUCAP in the last 168 hours.  Time coordinating discharge:  Greater than 30 minutes  Signed:  Bella Brummet, DO [facility] Pager: [phone] 10/01/2015, 3:22 PM

## 2015-10-01 NOTE — Care Management Note (Signed)
Case Management Note  Patient Details  Name: [name] MRN: [id number] Date of Birth: Oct 05, 1928  Subjective/Objective:                    Action/Plan: Pt discharging home with self care. No further needs per CM.   Expected Discharge Date:                  Expected Discharge Plan:  Home/Self Care  In-House Referral:     Discharge planning Services     Post Acute Care Choice:    Choice offered to:     DME Arranged:    DME Agency:     HH Arranged:    [place] Agency:     Status of Service:  Completed, signed off  If discussed at [employer] of Stay Meetings, dates discussed:    Additional Comments:  Pollie Friar, RN 10/01/2015, 4:01 PM

## 2015-10-01 NOTE — Progress Notes (Signed)
Patient had active order for enteric precaution pending a Gastrointestinal panel by PCR of stool was not in place at start of night shift, but is now.

## 2015-10-02 LAB — RETICULIN ANTIBODIES, IGA W TITER: RETICULIN AB, IGA: NEGATIVE {titer}

## 2015-10-02 LAB — TISSUE TRANSGLUTAMINASE, IGA

## 2015-10-03 LAB — VAS US CAROTID
LEFT ECA DIAS: 13 cm/s
LEFT VERTEBRAL DIAS: 11 cm/s
LICADDIAS: -35 cm/s
LICAPDIAS: -20 cm/s
LICAPSYS: -83 cm/s
Left CCA dist dias: 11 cm/s
Left CCA dist sys: 96 cm/s
Left CCA prox dias: 18 cm/s
Left CCA prox sys: 100 cm/s
Left ICA dist sys: -135 cm/s
RCCAPDIAS: 11 cm/s
RIGHT ECA DIAS: -12 cm/s
RIGHT VERTEBRAL DIAS: 8 cm/s
Right CCA prox sys: 62 cm/s
Right cca dist sys: -75 cm/s

## 2015-10-14 DIAGNOSIS — G894 Chronic pain syndrome: Secondary | ICD-10-CM | POA: Diagnosis not present

## 2015-10-14 DIAGNOSIS — E039 Hypothyroidism, unspecified: Secondary | ICD-10-CM | POA: Diagnosis not present

## 2015-10-14 DIAGNOSIS — I10 Essential (primary) hypertension: Secondary | ICD-10-CM | POA: Diagnosis not present

## 2015-10-26 NOTE — Progress Notes (Deleted)
Cardiology Office Note Date:  10/26/2015  Patient ID:  [name], [name] [birth year], MRN [id number] PCP:  Betty Martinique, MD  Cardiologist:  Dr. Sallyanne Kuster Electrophysiologist: Dr. Caryl Comes  ***refresh   Chief Complaint: device visit  History of Present Illness: [name] is a 82 y.o. female with history of NICM, CRT-D, COPD with chronic SOB, HLD, neuropathy (secondary to chemo), ovarian ca, HTN comes to the office today to be seen for Dr. Caryl Comes.  She was [facility] from [facility]/15 with original c/o dysarthria, this was felt ultimately to be secondary to dehydration associated with diuretic use and N/V/D and responded well to IVF, CT was negative for infarct, her diuretics stopped.  She was last seen by cardiology, Dr. Sallyanne Kuster in [place] at that time doing well.  Her last visit with Dr. Caryl Comes, march 2016, at that time her device had reached ERI and subsequently underwent generator change.    She comes today feeling ***  *** shocks? *** symptoms *** labs *** f/u with PMD GI, labs, LFTs, chronic anemia *** ongoing GI issues?  Any CHF edema off diuretics?  Resume diuretic? Some diastolic dysfunction on her echo  Device history: *** patient states she has never been shocked    Past Medical History:  Diagnosis Date  . AICD (automatic cardioverter/defibrillator) present 06/27/2007   medtronic  . Cancer ([place])    ovarian--stage 4  . Cardiomyopathy ([place])   . CHF (congestive heart failure) ([place])   . Peripheral neuropathy ([place])   . Systemic hypertension   . Thyroid disease     Past Surgical History:  Procedure Laterality Date  . ABDOMINAL HYSTERECTOMY  oct 2000   along with ovarian ca surg  . APPENDECTOMY    . CARDIAC CATHETERIZATION  09/27/1990   normal coronaries,dilated cardiomyopathy  . CARDIAC DEFIBRILLATOR PLACEMENT  06/27/2007   medtronic concerto  . CHOLECYSTECTOMY    . IMPLANTABLE CARDIOVERTER DEFIBRILLATOR GENERATOR CHANGE N/A 07/31/2013   Procedure: IMPLANTABLE  CARDIOVERTER DEFIBRILLATOR GENERATOR CHANGE;  Surgeon: Sanda Klein, MD;  Location: [facility] CATH [facility];  Service: Cardiovascular;  Laterality: N/A;  . NM MYOCAR PERF WALL MOTION  06/05/2007   no ischemia    Current Outpatient Prescriptions  Medication Sig Dispense Refill  . alendronate (FOSAMAX) 70 MG tablet Take 70 mg by mouth once a week. Sundays    . aspirin EC 325 MG EC tablet Take 1 tablet (325 mg total) by mouth daily. 30 tablet 0  . calcium-vitamin D (OSCAL WITH D) 250-125 MG-UNIT per tablet Take 2 tablets by mouth daily.     . carvedilol (COREG) 12.5 MG tablet Take 1 tablet (12.5 mg total) by mouth 2 (two) times daily with a meal. 180 tablet 0  . co-enzyme Q-10 50 MG capsule Take 50 mg by mouth daily. Reported on 09/29/2015    . COMBIGAN 0.2-0.5 % ophthalmic solution Place 1 drop into both eyes every 12 (twelve) hours.     . digoxin (LANOXIN) 0.25 MG tablet TAKE 1 TABLET DAILY 90 tablet 1  . DULERA 200-5 MCG/ACT AERO Inhale 2 puffs into the lungs 2 (two) times daily.  3  . DULoxetine (CYMBALTA) 30 MG capsule Take 30 mg by mouth 2 (two) times daily.   2  . feeding supplement, ENSURE ENLIVE, (ENSURE ENLIVE) LIQD Take 237 mLs by mouth 2 (two) times daily between meals. 60 Bottle 0  . furosemide (LASIX) 20 MG tablet TAKE ONE-HALF (1/2) TABLET DAILY 45 tablet 6  . latanoprost (XALATAN) 0.005 % ophthalmic solution  Place 1 drop into both eyes at bedtime.  6  . liothyronine (CYTOMEL) 25 MCG tablet Take 25 mcg by mouth daily.    Mar[place] Kitchen losartan (COZAAR) 100 MG tablet TAKE 1 TABLET DAILY 90 tablet 1  . oxyCODONE (ROXICODONE) 15 MG immediate release tablet Take 15 mg by mouth every 6 (six) hours as needed for pain.   0  . SYNTHROID 112 MCG tablet Take 112 mcg by mouth daily before breakfast.      No current facility-administered medications for this visit.     Allergies:   Review of patient's allergies indicates no known allergies.   Social History:  The patient  reports that she has never smoked. She  does not have any smokeless tobacco history on file. She reports that she does not drink alcohol or use drugs.   Family History:  The patient's family history includes Heart attack in her father; Heart disease in her paternal grandmother; Lung cancer in her father and mother.***  ROS:  Please see the history of present illness. Otherwise, review of systems is positive for ***.   All other systems are reviewed and otherwise negative.   PHYSICAL EXAM: *** VS:  There were no vitals taken for this visit. BMI: There is no height or weight on file to calculate BMI. Well nourished, well developed, in no acute distress  HEENT: normocephalic, atraumatic  Neck: no JVD, carotid bruits or masses Cardiac:  normal S1, S2; RRR; no significant murmurs, no rubs, or gallops Lungs:  clear to auscultation bilaterally, no wheezing, rhonchi or rales  Abd: soft, nontender,  + BS MS: no deformity or atrophy Ext: no edema  Skin: warm and dry, no rash Neuro:  No gross deficits appreciated Psych: euthymic mood, full affect  *** ICD site is stable, no tethering or discomfort   EKG:  Done today shows ***  09/30/15: Echocardiogram Study Conclusions - Left ventricle: The cavity size was normal. There was moderate   concentric hypertrophy. Systolic function was vigorous. The   estimated ejection fraction was in the range of 65% to 70%. Wall   motion was normal; there were no regional wall motion   abnormalities. Abnormal relaxation with increased filling   pressures. - Aortic valve: There was mild regurgitation. - Mitral valve: Severely calcified annulus. Mildly thickened   leaflets . Valve area by continuity equation (using LVOT flow):   2.21 cm^2. - Left atrium: The atrium was severely dilated.  32mm  Recent Labs: 09/30/2015: TSH 0.679 10/01/2015: ALT 62; BUN 24; Creatinine, Ser 0.79; Hemoglobin 10.3; Platelets 155; Potassium 3.5; Sodium 134  09/30/2015: Cholesterol 135; HDL 32; LDL Cholesterol 80; Total  CHOL/HDL Ratio 4.2; Triglycerides 113; VLDL 23   CrCl cannot be calculated (Unknown ideal weight.).   Wt Readings from Last 3 Encounters:  10/01/15 117 lb 4.6 oz (53.2 kg)  05/20/15 129 lb 6.4 oz (58.7 kg)  04/22/15 127 lb (57.6 kg)     Other studies reviewed: Additional studies/records reviewed today include: summarized above  DEVICE information: MDT CRT-D, gen change 07/31/13, original implant RA/RV leads 06/27/07, LV dislodgead and new LV lead 06/28/07.  Primary prevention.  ASSESSMENT AND PLAN:  *** Coumadin or OAC Labs, CHADS *** AAD, labs/EKG, testing needed? *** site check *** remotes *** device info  1. NICM, normalized EF     CRT-D with normal function     *** weight     *** optivol     On BB, ARB, lasix, dig  2. HTN  Stable  Disposition: F/u with ***  Current medicines are reviewed at length with the patient today.  The patient did not have any concerns regarding medicines.***  Signed, Jennings Books, PA-C 10/26/2015 5:16 PM     [facility] [address] [place] [address] [phone] (office)  [phone] (fax)

## 2015-10-27 ENCOUNTER — Encounter: Payer: PRIVATE HEALTH INSURANCE | Admitting: Physician Assistant

## 2015-10-31 ENCOUNTER — Encounter: Payer: Self-pay | Admitting: Physician Assistant

## 2015-10-31 ENCOUNTER — Encounter (INDEPENDENT_AMBULATORY_CARE_PROVIDER_SITE_OTHER): Payer: Self-pay

## 2015-10-31 ENCOUNTER — Ambulatory Visit (INDEPENDENT_AMBULATORY_CARE_PROVIDER_SITE_OTHER): Payer: Medicare Other | Admitting: Physician Assistant

## 2015-10-31 VITALS — BP 134/72 | HR 60 | Ht 60.0 in | Wt 120.0 lb

## 2015-10-31 DIAGNOSIS — I639 Cerebral infarction, unspecified: Secondary | ICD-10-CM

## 2015-10-31 DIAGNOSIS — I10 Essential (primary) hypertension: Secondary | ICD-10-CM | POA: Diagnosis not present

## 2015-10-31 DIAGNOSIS — Z95810 Presence of automatic (implantable) cardiac defibrillator: Secondary | ICD-10-CM

## 2015-10-31 DIAGNOSIS — I5042 Chronic combined systolic (congestive) and diastolic (congestive) heart failure: Secondary | ICD-10-CM | POA: Diagnosis not present

## 2015-10-31 LAB — CUP PACEART INCLINIC DEVICE CHECK
Battery Remaining Longevity: 33 mo
Battery Voltage: 2.95 V
Brady Statistic RA Percent Paced: 80.51 %
Brady Statistic RV Percent Paced: 98.58 %
HighPow Impedance: 45 Ohm
HighPow Impedance: 60 Ohm
Implantable Lead Implant Date: 20090331
Implantable Lead Implant Date: 20090401
Implantable Lead Location: 753858
Implantable Lead Location: 753860
Implantable Lead Model: 5076
Lead Channel Impedance Value: 513 Ohm
Lead Channel Pacing Threshold Amplitude: 1.75 V
Lead Channel Pacing Threshold Pulse Width: 0.4 ms
Lead Channel Pacing Threshold Pulse Width: 0.4 ms
Lead Channel Sensing Intrinsic Amplitude: 2 mV
Lead Channel Sensing Intrinsic Amplitude: 2.125 mV
Lead Channel Setting Pacing Amplitude: 2 V
Lead Channel Setting Pacing Pulse Width: 0.4 ms
MDC IDC LEAD IMPLANT DT: 20090331
MDC IDC LEAD LOCATION: 753859
MDC IDC LEAD MODEL: 4194
MDC IDC MSMT LEADCHNL LV IMPEDANCE VALUE: 361 Ohm
MDC IDC MSMT LEADCHNL LV IMPEDANCE VALUE: 418 Ohm
MDC IDC MSMT LEADCHNL LV IMPEDANCE VALUE: 608 Ohm
MDC IDC MSMT LEADCHNL LV PACING THRESHOLD AMPLITUDE: 1.5 V
MDC IDC MSMT LEADCHNL LV PACING THRESHOLD PULSEWIDTH: 0.4 ms
MDC IDC MSMT LEADCHNL RA IMPEDANCE VALUE: 532 Ohm
MDC IDC MSMT LEADCHNL RA PACING THRESHOLD AMPLITUDE: 0.75 V
MDC IDC MSMT LEADCHNL RV IMPEDANCE VALUE: 399 Ohm
MDC IDC MSMT LEADCHNL RV SENSING INTR AMPL: 17.75 mV
MDC IDC MSMT LEADCHNL RV SENSING INTR AMPL: 21.375 mV
MDC IDC SESS DTM: 20170804165620
MDC IDC SET LEADCHNL LV PACING AMPLITUDE: 2.25 V
MDC IDC SET LEADCHNL LV PACING PULSEWIDTH: 0.4 ms
MDC IDC SET LEADCHNL RV PACING AMPLITUDE: 4 V
MDC IDC SET LEADCHNL RV SENSING SENSITIVITY: 0.3 mV
MDC IDC STAT BRADY AP VP PERCENT: 79.83 %
MDC IDC STAT BRADY AP VS PERCENT: 0.68 %
MDC IDC STAT BRADY AS VP PERCENT: 19.22 %
MDC IDC STAT BRADY AS VS PERCENT: 0.27 %

## 2015-10-31 NOTE — Patient Instructions (Addendum)
Medication Instructions:    Your physician recommends that you continue on your current medications as directed. Please refer to the Current Medication list given to you today.   If you need a refill on your cardiac medications before your next appointment, please call your pharmacy.  Labwork: NONE ORDER TODAY    Testing/Procedures: NONE ORDER TODAY    Follow-Up: Your physician wants you to follow-up in:  [place] will receive a reminder letter in the mail two months in advance. If you don't receive a letter, please call our office to schedule the follow-up appointment.   Remote monitoring is used to monitor your Pacemaker of ICD from home. This monitoring reduces the number of office visits required to check your device to one time per year. It allows [name] to keep an eye on the functioning of your device to ensure it is working properly. You are scheduled for a device check from home on . 02/02/2016..You may send your transmission at any time that day. If you have a wireless device, the transmission will be sent automatically. After your physician reviews your transmission, you will receive a postcard with your next transmission date.  .      Any Other Special Instructions Will Be Listed Below (If Applicable).

## 2015-10-31 NOTE — Progress Notes (Signed)
Cardiology Office Note Date:  10/26/2015  Patient ID:  [name] 1928-08-15, MRN [id number] PCP:  Betty Martinique, MD                    Cardiologist:  Dr. Sallyanne Kuster Electrophysiologist: Dr. Caryl Comes    Chief Complaint: routine device visit  History of Present Illness: [name] is a 82 y.o. female with history of NICM, CRT-D, COPD with chronic SOB, HLD, neuropathy (secondary to chemo), ovarian ca, HTN comes to the office today to be seen for Dr. Caryl Comes.  She was discharged from [facility] 10/[facility]/15 with original c/o dysarthria, this was felt ultimately to be secondary to dehydration associated with diuretic use and N/V/D and responded well to IVF, CT was negative for infarct, her diuretics stopped.  She was last seen by cardiology, Dr. Sallyanne Kuster in Jan at that time doing well.  Her last visit with Dr. Caryl Comes, March 2016, at that time her device had reached ERI and subsequently underwent generator change.    She comes today feeling well.  She reports she is eating and taking PO better.  Explains that her husband died suddenly 32mo ago and had not been taking very good care of herself in regards to eating/water intake for some time, since her hospital stay though is doing better and feeling much better.  She denies any kind of CP, palpitations, no dizziness, near syncope or syncope.  She states she feels a little winded with heavier exertion with known COPD, but none with routine ADL, no rest SOB, no symptoms or PND or orthopnea.   DEVICE information: MDT CRT-D, gen change 07/31/13, original implant RA/RV leads 06/27/07, LV dislodgead and new LV lead 06/28/07.  Primary prevention. patient states she has never been shocked        Past Medical History:  Diagnosis Date  . AICD (automatic cardioverter/defibrillator) present 06/27/2007   medtronic  . Cancer ([place])    ovarian--stage 4  . Cardiomyopathy ([place])   . CHF (congestive heart failure) ([place])   . Peripheral neuropathy ([place])   .  Systemic hypertension   . Thyroid disease          Past Surgical History:  Procedure Laterality Date  . ABDOMINAL HYSTERECTOMY  oct 2000   along with ovarian ca surg  . APPENDECTOMY    . CARDIAC CATHETERIZATION  09/27/1990   normal coronaries,dilated cardiomyopathy  . CARDIAC DEFIBRILLATOR PLACEMENT  06/27/2007   medtronic concerto  . CHOLECYSTECTOMY    . IMPLANTABLE CARDIOVERTER DEFIBRILLATOR GENERATOR CHANGE N/A 07/31/2013   Procedure: IMPLANTABLE CARDIOVERTER DEFIBRILLATOR GENERATOR CHANGE;  Surgeon: Sanda Klein, MD;  Location: [place] CATH LAB;  Service: Cardiovascular;  Laterality: N/A;  . NM MYOCAR PERF WALL MOTION  06/05/2007   no ischemia          Current Outpatient Prescriptions  Medication Sig Dispense Refill  . alendronate (FOSAMAX) 70 MG tablet Take 70 mg by mouth once a week. Sundays    . aspirin EC 325 MG EC tablet Take 1 tablet (325 mg total) by mouth daily. 30 tablet 0  . calcium-vitamin D (OSCAL WITH D) 250-125 MG-UNIT per tablet Take 2 tablets by mouth daily.     . carvedilol (COREG) 12.5 MG tablet Take 1 tablet (12.5 mg total) by mouth 2 (two) times daily with a meal. 180 tablet 0  . co-enzyme Q-10 50 MG capsule Take 50 mg by mouth daily. Reported on 09/29/2015    . COMBIGAN 0.2-0.5 % ophthalmic solution Place  1 drop into both eyes every 12 (twelve) hours.     . digoxin (LANOXIN) 0.25 MG tablet TAKE 1 TABLET DAILY 90 tablet 1  . DULERA 200-5 MCG/ACT AERO Inhale 2 puffs into the lungs 2 (two) times daily.  3  . DULoxetine (CYMBALTA) 30 MG capsule Take 30 mg by mouth 2 (two) times daily.   2  . feeding supplement, ENSURE ENLIVE, (ENSURE ENLIVE) LIQD Take 237 mLs by mouth 2 (two) times daily between meals. 60 Bottle 0  . furosemide (LASIX) 20 MG tablet TAKE ONE-HALF (1/2) TABLET DAILY 45 tablet 6  . latanoprost (XALATAN) 0.005 % ophthalmic solution Place 1 drop into both eyes at bedtime.  6  . liothyronine (CYTOMEL) 25 MCG tablet Take 25 mcg by  mouth daily.    Marland Kitchen losartan (COZAAR) 100 MG tablet TAKE 1 TABLET DAILY 90 tablet 1  . oxyCODONE (ROXICODONE) 15 MG immediate release tablet Take 15 mg by mouth every 6 (six) hours as needed for pain.   0  . SYNTHROID 112 MCG tablet Take 112 mcg by mouth daily before breakfast.      No current facility-administered medications for this visit.     Allergies:   Review of patient's allergies indicates no known allergies.   Social History:  The patient  reports that she has never smoked. She does not have any smokeless tobacco history on file. She reports that she does not drink alcohol or use drugs.   Family History:  The patient's family history includes Heart attack in her father; Heart disease in her paternal grandmother; Lung cancer in her father and mother.  ROS:  Please see the history of present illness.  All other systems are reviewed and otherwise negative.   PHYSICAL EXAM:  134/72, 120lbs, 60bpm  Well nourished, well developed, in no acute distress  HEENT: normocephalic, atraumatic  Neck: no JVD, carotid bruits or masses Cardiac:  RRR; no significant murmurs, no rubs, or gallops Lungs:  clear to auscultation bilaterally, no wheezing, rhonchi or rales  Abd: soft, nontender MS: no deformity or atrophy Ext: trace if any edema  Skin: warm and dry, no rash Neuro:  No gross deficits appreciated Psych: euthymic mood, full affect  ICD site is stable, no tethering or discomfort   EKG:  Done7/3/17, AV paced ICD interrogation today: normal device function, she is paced at 35bpm, chronic high RV threshold, no observations, no AMS, battery status OK  09/30/15: Echocardiogram Study Conclusions - Left ventricle: The cavity size was normal. There was moderate concentric hypertrophy. Systolic function was vigorous. The estimated ejection fraction was in the range of 65% to 70%. Wall motion was normal; there were no regional wall motion abnormalities. Abnormal  relaxation with increased filling pressures. - Aortic valve: There was mild regurgitation. - Mitral valve: Severely calcified annulus. Mildly thickened leaflets . Valve area by continuity equation (using LVOT flow): 2.21 cm^2. - Left atrium: The atrium was severely dilated.  47mm  Recent Labs: 09/30/2015: TSH 0.679 10/01/2015: ALT 62; BUN 24; Creatinine, Ser 0.79; Hemoglobin 10.3; Platelets 155; Potassium 3.5; Sodium 134  09/30/2015: Cholesterol 135; HDL 32; LDL Cholesterol 80; Total CHOL/HDL Ratio 4.2; Triglycerides 113; VLDL 23   CrCl cannot be calculated (Unknown ideal weight.).   Wt Readings from Last 3 Encounters:  10/01/15 117 lb 4.6 oz (53.2 kg)  05/20/15 129 lb 6.4 oz (58.7 kg)  04/22/15 127 lb (57.6 kg)     Other studies reviewed: Additional studies/records reviewed today include: summarized above  DEVICE  information: MDT CRT-D, gen change 07/31/13, original implant RA/RV leads 06/27/07, LV dislodgead and new LV lead 06/28/07.  Primary prevention.  ASSESSMENT AND PLAN:  1. NICM, normalized EF     CRT-D with normal function     weight is down from prior, OptiVol is up, though exam and weight do not suggest fluid OL, follow     On BB, ARB, lasix, dig     Labs OK 10/01/15     Dig level 09/29/15 is 1.9      99% BiVe pacing  2. HTN     Stable  3. Dr. Loletha Grayer noted 48 seconds of AFlutter in Jan on device check     No AMS episodes observed on today's check or her interem remotes     Continue to monitor via he device  Disposition: Remote device check in 37months, Dr. Caryl Comes in 1 year, sooner if needed.  Follow up with her PMD.  Note: given dig level upper limits therapeutic and her advanced age decided after she left to decrease to 0.125mg  daily, will call the patient.  Current medicines are reviewed at length with the patient today.  The patient did not have any concerns regarding medicines.  Haywood Lasso, PA-C 10/26/2015 5:16 PM     [place] [address] [phone] (office)  [phone] (fax)

## 2015-11-05 ENCOUNTER — Telehealth: Payer: Self-pay | Admitting: *Deleted

## 2015-11-05 ENCOUNTER — Other Ambulatory Visit: Payer: Self-pay | Admitting: *Deleted

## 2015-11-05 NOTE — Telephone Encounter (Signed)
LMOVM TO CALL BACK FOR MEDICATIONS CHANGES.Marland Kitchen

## 2015-11-05 NOTE — Telephone Encounter (Signed)
-----   Message from [place], [place] sent at 10/31/2015  5:06 PM EDT ----- Please call and have the patient decrease her Lanoxin to 0.125mg  daily,her dig level is on the higher side of normal given her age will decrease it.  Thanks [employer]

## 2015-11-05 NOTE — Telephone Encounter (Signed)
-----   Message from [place], [place] sent at 10/31/2015  5:06 PM EDT ----- Please call and have the patient decrease her Lanoxin to 0.125mg  daily,her dig level is on the higher side of normal given her age will decrease it.  Thanks [employer]

## 2015-11-23 ENCOUNTER — Other Ambulatory Visit: Payer: Self-pay | Admitting: Internal Medicine

## 2015-11-26 ENCOUNTER — Other Ambulatory Visit: Payer: Self-pay | Admitting: Cardiovascular Disease

## 2015-11-26 MED ORDER — DIGOXIN 125 MCG PO TABS: 0.1250 mg | ORAL_TABLET | Freq: Every day | ORAL | 3 refills | Status: DC

## 2015-11-26 NOTE — Telephone Encounter (Signed)
Per last office note, digoxin dose was decreased to 0.125 mg daily per Baldwin Jamaica. New rx sent to pharmacy.

## 2015-11-26 NOTE — Telephone Encounter (Signed)
  [name]  10/31/2015 2:30 PM  Office Visit  MRN:  [id number]  Description: Female DOB: Mar 10, 1929 Provider: Baldwin [place], PA-C Department: [address] [facility]  Vitals   BP  134/72   Pulse  60   Ht  5' (1.524 m)   Wt  120 lb (54.4 kg)   BMI  23.44 kg/m      Vitals History  Progress Notes   Baldwin [place], PA-C at 10/31/2015 2:30 PM    Note: given dig level upper limits therapeutic and her advanced age decided after she left to decrease to 0.125mg  daily, will call the patient.  Telephone   11/05/2015 [place], [name]  (Newest Message First)  [name], [name]      11/05/15 11:49 AM  Note    LMOVM TO CALL BACK FOR MEDICATIONS CHANGES.Marland Kitchen          11/05/15 11:49 AM    [name], [name] contacted [name], [place]      11/05/15 9:56 AM  Note    ----- Message from [place], PA-C sent at 10/31/2015  5:06 PM EDT ----- Please call and have the patient decrease her Lanoxin to 0.125mg  daily,her dig level is on the higher side of normal given her age will decrease it.  Thanks [employer]

## 2015-12-15 ENCOUNTER — Other Ambulatory Visit: Payer: Self-pay | Admitting: *Deleted

## 2015-12-15 NOTE — Telephone Encounter (Signed)
Patient requested a short term supply until mail order arrives.

## 2015-12-16 MED ORDER — CARVEDILOL 12.5 MG PO TABS: 12.5000 mg | ORAL_TABLET | Freq: Two times a day (BID) | ORAL | 1 refills | Status: DC

## 2015-12-31 DIAGNOSIS — Z23 Encounter for immunization: Secondary | ICD-10-CM | POA: Diagnosis not present

## 2016-01-28 DIAGNOSIS — G894 Chronic pain syndrome: Secondary | ICD-10-CM | POA: Diagnosis not present

## 2016-01-28 DIAGNOSIS — R2681 Unsteadiness on feet: Secondary | ICD-10-CM | POA: Diagnosis not present

## 2016-02-02 ENCOUNTER — Encounter: Payer: Medicare Other | Admitting: *Deleted

## 2016-02-02 ENCOUNTER — Telehealth: Payer: Self-pay | Admitting: Cardiology

## 2016-02-02 NOTE — Telephone Encounter (Signed)
LMOVM reminding pt to send remote transmission.   

## 2016-02-06 ENCOUNTER — Encounter: Payer: Self-pay | Admitting: Cardiology

## 2016-02-08 ENCOUNTER — Other Ambulatory Visit: Payer: Self-pay | Admitting: Cardiovascular Disease

## 2016-05-03 ENCOUNTER — Telehealth: Payer: Self-pay | Admitting: Cardiology

## 2016-05-03 ENCOUNTER — Encounter: Payer: Medicare Other | Admitting: *Deleted

## 2016-05-03 NOTE — Telephone Encounter (Signed)
LMOVM reminding pt to send remote transmission.   

## 2016-05-07 ENCOUNTER — Encounter: Payer: Self-pay | Admitting: Cardiology

## 2016-05-07 DIAGNOSIS — I10 Essential (primary) hypertension: Secondary | ICD-10-CM | POA: Diagnosis not present

## 2016-05-07 DIAGNOSIS — R2681 Unsteadiness on feet: Secondary | ICD-10-CM | POA: Diagnosis not present

## 2016-05-07 DIAGNOSIS — G894 Chronic pain syndrome: Secondary | ICD-10-CM | POA: Diagnosis not present

## 2016-05-10 ENCOUNTER — Ambulatory Visit (INDEPENDENT_AMBULATORY_CARE_PROVIDER_SITE_OTHER): Payer: Medicare Other | Admitting: *Deleted

## 2016-05-10 DIAGNOSIS — I428 Other cardiomyopathies: Secondary | ICD-10-CM | POA: Diagnosis not present

## 2016-05-10 NOTE — Progress Notes (Signed)
Remote ICD transmission.   

## 2016-05-11 ENCOUNTER — Encounter: Payer: Self-pay | Admitting: Cardiology

## 2016-05-13 LAB — CUP PACEART REMOTE DEVICE CHECK
Battery Voltage: 2.94 V
Brady Statistic AP VS Percent: 0.72 %
Brady Statistic RA Percent Paced: 36.83 %
Brady Statistic RV Percent Paced: 97.27 %
HIGH POWER IMPEDANCE MEASURED VALUE: 53 Ohm
HighPow Impedance: 43 Ohm
Implantable Lead Implant Date: 20090331
Implantable Lead Implant Date: 20090401
Implantable Lead Model: 4194
Implantable Lead Model: 5076
Implantable Pulse Generator Implant Date: 20150505
Lead Channel Impedance Value: 475 Ohm
Lead Channel Pacing Threshold Amplitude: 2 V
Lead Channel Pacing Threshold Pulse Width: 0.4 ms
Lead Channel Sensing Intrinsic Amplitude: 1.5 mV
Lead Channel Sensing Intrinsic Amplitude: 19.75 mV
Lead Channel Setting Pacing Amplitude: 2 V
Lead Channel Setting Pacing Amplitude: 2.5 V
MDC IDC LEAD IMPLANT DT: 20090331
MDC IDC LEAD LOCATION: 753858
MDC IDC LEAD LOCATION: 753859
MDC IDC LEAD LOCATION: 753860
MDC IDC MSMT BATTERY REMAINING LONGEVITY: 26 mo
MDC IDC MSMT LEADCHNL LV IMPEDANCE VALUE: 342 Ohm
MDC IDC MSMT LEADCHNL LV IMPEDANCE VALUE: 399 Ohm
MDC IDC MSMT LEADCHNL LV IMPEDANCE VALUE: 608 Ohm
MDC IDC MSMT LEADCHNL LV PACING THRESHOLD AMPLITUDE: 1.5 V
MDC IDC MSMT LEADCHNL LV PACING THRESHOLD PULSEWIDTH: 0.4 ms
MDC IDC MSMT LEADCHNL RA PACING THRESHOLD AMPLITUDE: 0.875 V
MDC IDC MSMT LEADCHNL RA PACING THRESHOLD PULSEWIDTH: 0.4 ms
MDC IDC MSMT LEADCHNL RA SENSING INTR AMPL: 1.5 mV
MDC IDC MSMT LEADCHNL RV IMPEDANCE VALUE: 399 Ohm
MDC IDC MSMT LEADCHNL RV IMPEDANCE VALUE: 532 Ohm
MDC IDC MSMT LEADCHNL RV SENSING INTR AMPL: 19.75 mV
MDC IDC SESS DTM: 20180210195424
MDC IDC SET LEADCHNL LV PACING PULSEWIDTH: 0.4 ms
MDC IDC SET LEADCHNL RV PACING AMPLITUDE: 4.75 V
MDC IDC SET LEADCHNL RV PACING PULSEWIDTH: 0.4 ms
MDC IDC SET LEADCHNL RV SENSING SENSITIVITY: 0.3 mV
MDC IDC STAT BRADY AP VP PERCENT: 36.15 %
MDC IDC STAT BRADY AS VP PERCENT: 62.2 %
MDC IDC STAT BRADY AS VS PERCENT: 0.93 %

## 2016-06-23 IMAGING — CT CT ABD-PELV W/ CM
2 of 5 series · 16 of 46 positions shown, 18 images · IV contrast (OMNIPAQUE 300)
Comparison: CT dated 03/28/2015

CLINICAL DATA: 86-year-old female with abdominal pain, nausea
vomiting.

EXAM:
CT ABDOMEN AND PELVIS WITH CONTRAST
TECHNIQUE: Multidetector CT imaging of the abdomen and pelvis was performed
using the standard protocol following bolus administration of
intravenous contrast.
CONTRAST:  50mL OMNIPAQUE IOHEXOL 300 MG/ML SOLN, 100mL OMNIPAQUE
IOHEXOL 300 MG/ML SOLN

[Series 2: abd/pel with · axial · 0.74mm/px · z∈[-459,-154]mm · 13 of 71 slices shown, 15 images]
[im 5/71  soft-tissue]
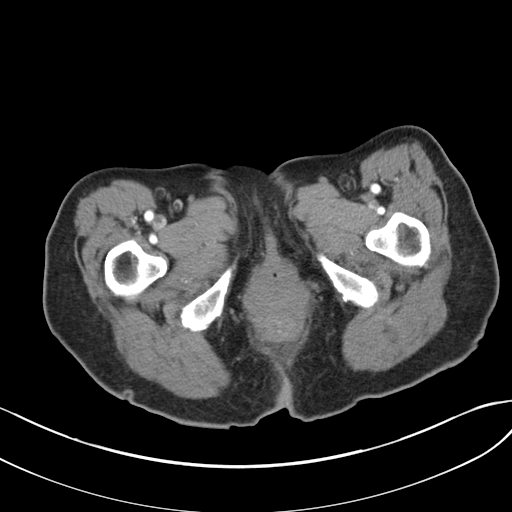
[im 5/71  bone]
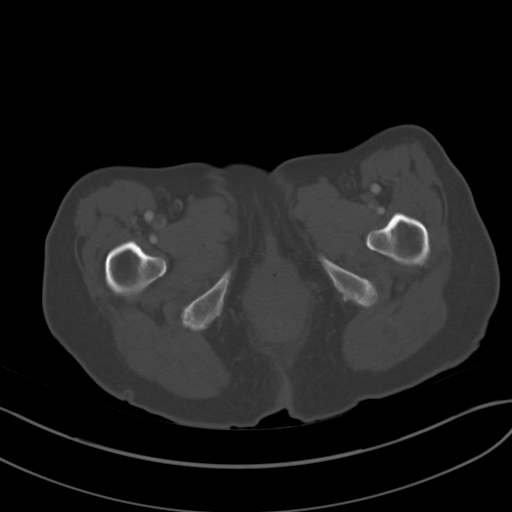
[im 9/71  soft-tissue]
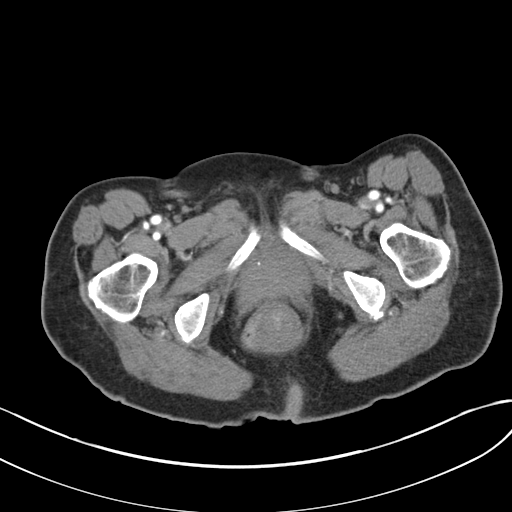
[im 14/71  soft-tissue]
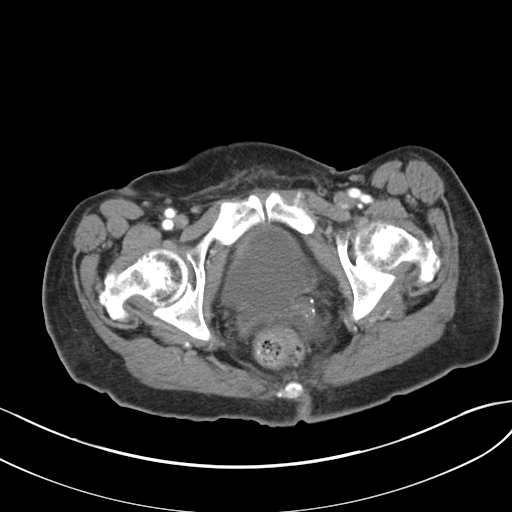
[im 22/71  soft-tissue]
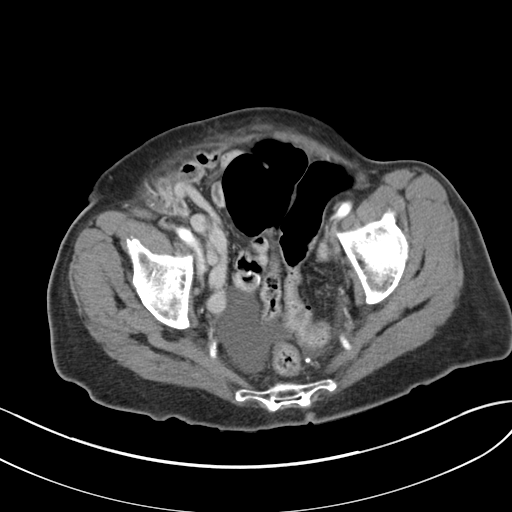
[im 27/71  soft-tissue]
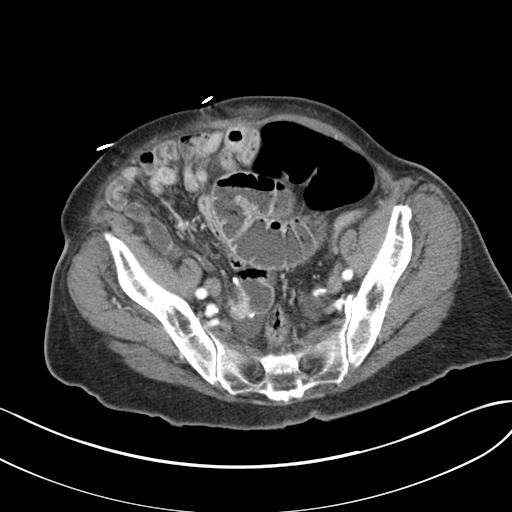
[im 31/71  soft-tissue]
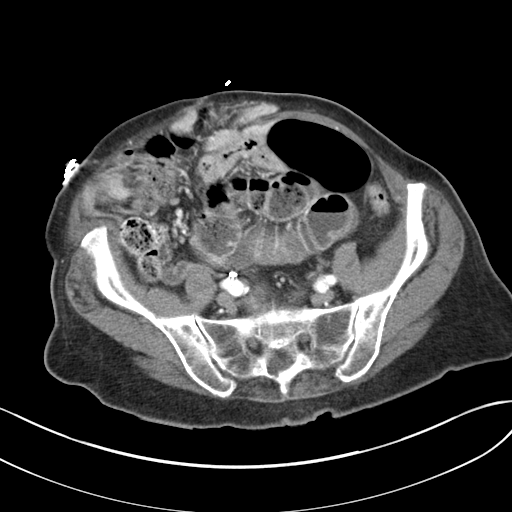
[im 36/71  soft-tissue]
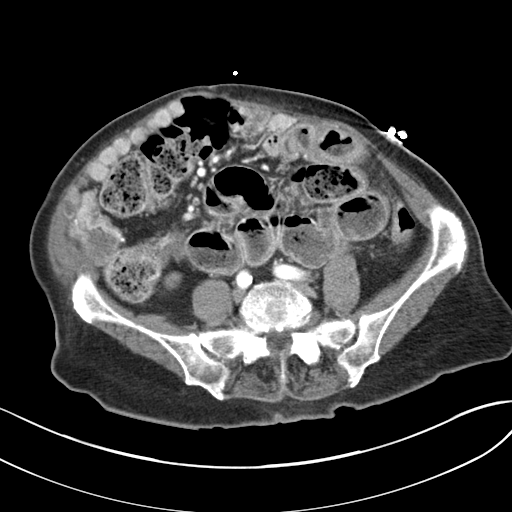
[im 40/71  soft-tissue]
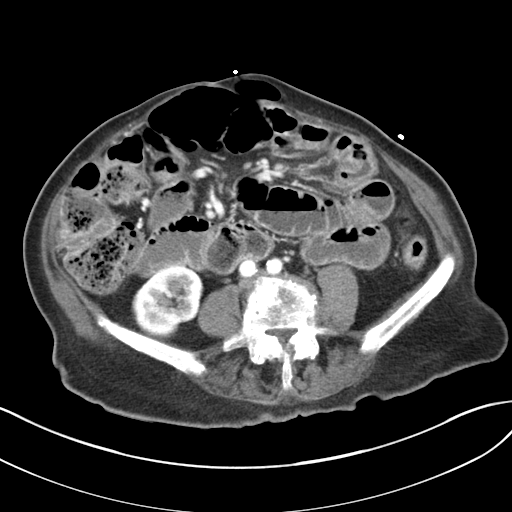
[im 44/71  soft-tissue]
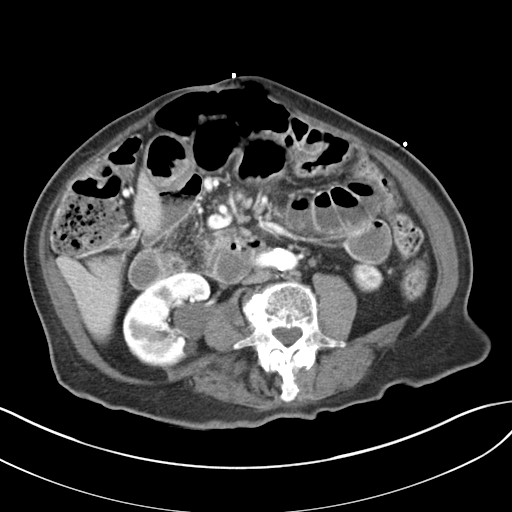
[im 44/71  bone]
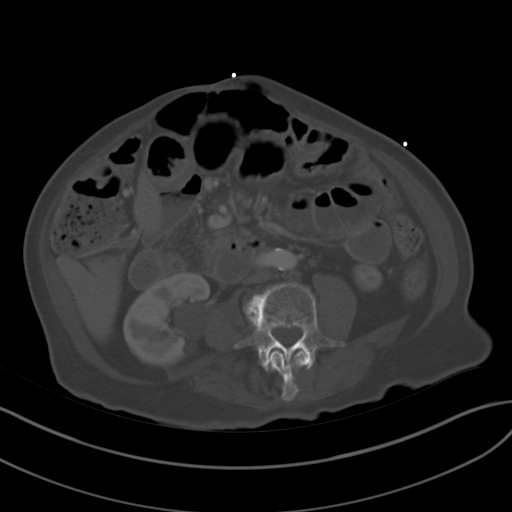
[im 49/71  soft-tissue]
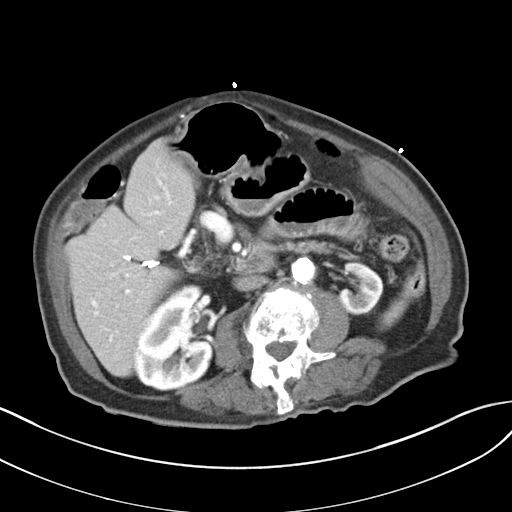
[im 57/71  soft-tissue]
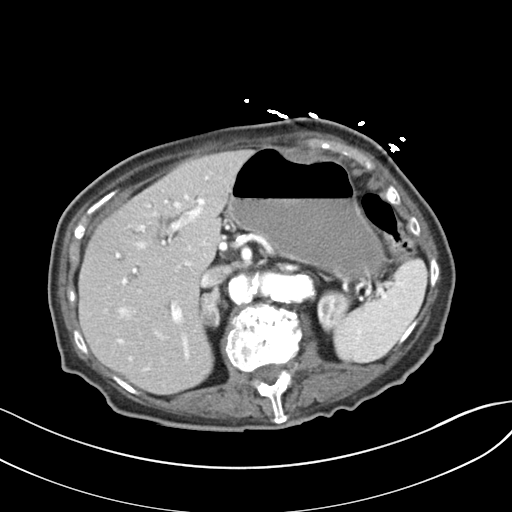
[im 62/71  soft-tissue]
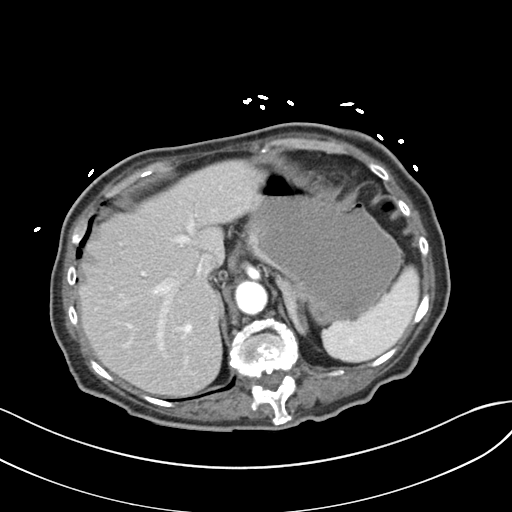
[im 66/71  soft-tissue]
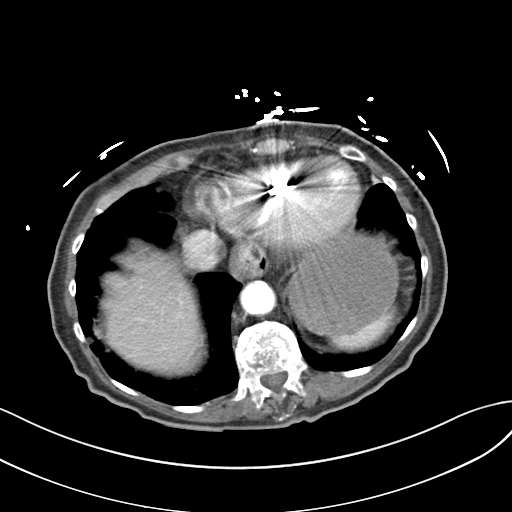

[Series 7: coronal a/|p · coronal · 0.68mm/px · 3 of 91 slices shown]
[im 31/91  soft-tissue]
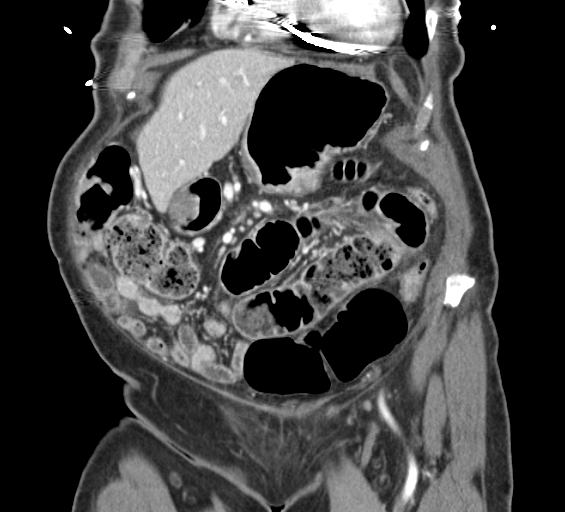
[im 41/91  soft-tissue]
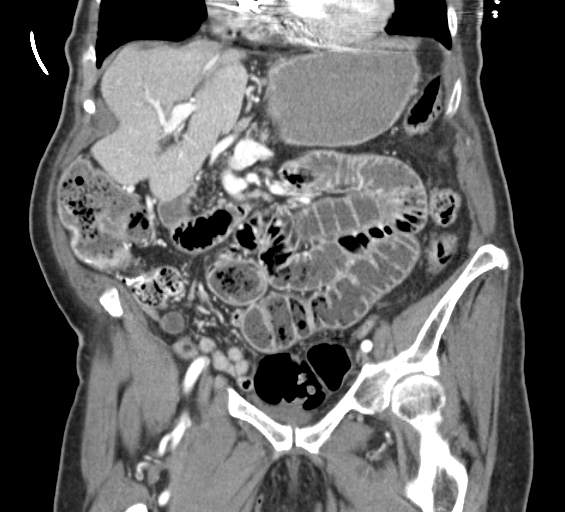
[im 51/91  soft-tissue]
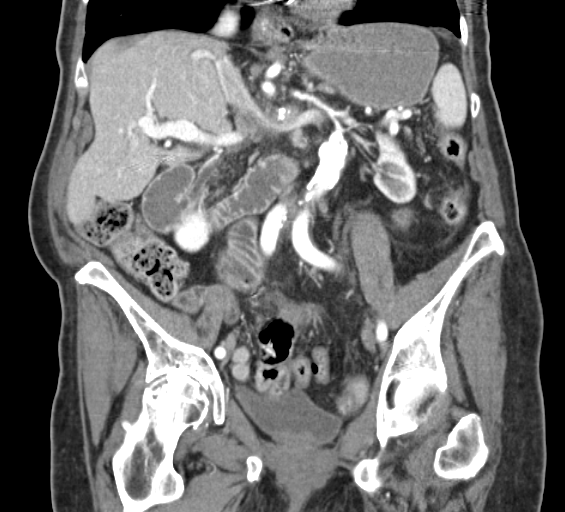

[16 of 46 positions shown; findings below may reference images not displayed]

FINDINGS: The visualized lung bases are clear. Mild cardiomegaly. Cardiac
pacemaker wires noted. No intra-abdominal free air. Small free fluid
within the pelvis.

Cholecystectomy. Mild biliary ductal dilatation, likely post
cholecystectomy. The liver appears unremarkable. The pancreas is
atrophic. The spleen appears unremarkable. There is left adrenal
thickening/ hyperplasia. There is an indeterminate stable 1.5 cm
right adrenal nodule. There is moderate left renal atrophy. There is
no hydronephrosis on the left. There is an extrarenal pelvis on the
left with mild fullness of the renal pelvis similar to prior study.
The visualized ureters and urinary bladder appear unremarkable.
Hysterectomy.

There is postsurgical changes of bowel resection with multiple
anastomotic sutures. Multiple mildly dilated loops of proximal and
mid small bowel noted. The distal small bowel demonstrate a normal
caliber. A transition zone is noted in the left anterior lower
abdomen (series 2 image 37) where there is abutment of bowel to the
anterior peritoneal wall and adhesion. There is mild thickening of
the bowel at this site. There is a broad-based ventral hernia along
the midline vertical anterior pelvic wall incisional scar. There is
abutment of loops of small bowel to the anterior peritoneal wall
compatible with adhesions. A left periumbilical hernia defect
containing a short segment of small bowel noted. There is no
evidence of bowel obstruction at this site.

There is aortoiliac atherosclerotic disease. The abdominal aorta is
tortuous and ectatic. No portal venous gas identified. There is no
adenopathy.

There is osteopenia with scoliosis and extensive degenerative
changes of the spine. There is avascular necrosis of the right
femoral head. No acute fracture.
IMPRESSION: Small-bowel obstruction with transition zone in the left anterior
lower abdomen secondary to peritoneal adhesions.

Other nonacute findings as seen on the prior exam and described
above.

## 2016-08-04 DIAGNOSIS — J302 Other seasonal allergic rhinitis: Secondary | ICD-10-CM | POA: Diagnosis not present

## 2016-08-04 DIAGNOSIS — E039 Hypothyroidism, unspecified: Secondary | ICD-10-CM | POA: Diagnosis not present

## 2016-08-04 DIAGNOSIS — G620 Drug-induced polyneuropathy: Secondary | ICD-10-CM | POA: Diagnosis not present

## 2016-08-04 DIAGNOSIS — R2681 Unsteadiness on feet: Secondary | ICD-10-CM | POA: Diagnosis not present

## 2016-08-04 DIAGNOSIS — Z0000 Encounter for general adult medical examination without abnormal findings: Secondary | ICD-10-CM | POA: Diagnosis not present

## 2016-08-04 DIAGNOSIS — M159 Polyosteoarthritis, unspecified: Secondary | ICD-10-CM | POA: Diagnosis not present

## 2016-08-04 DIAGNOSIS — I10 Essential (primary) hypertension: Secondary | ICD-10-CM | POA: Diagnosis not present

## 2016-08-04 DIAGNOSIS — I5042 Chronic combined systolic (congestive) and diastolic (congestive) heart failure: Secondary | ICD-10-CM | POA: Diagnosis not present

## 2016-08-04 DIAGNOSIS — Z634 Disappearance and death of family member: Secondary | ICD-10-CM | POA: Diagnosis not present

## 2016-08-04 DIAGNOSIS — G894 Chronic pain syndrome: Secondary | ICD-10-CM | POA: Diagnosis not present

## 2016-08-09 ENCOUNTER — Ambulatory Visit (INDEPENDENT_AMBULATORY_CARE_PROVIDER_SITE_OTHER): Payer: Medicare Other | Admitting: *Deleted

## 2016-08-09 DIAGNOSIS — I428 Other cardiomyopathies: Secondary | ICD-10-CM | POA: Diagnosis not present

## 2016-08-09 NOTE — Progress Notes (Signed)
Remote ICD transmission.   

## 2016-08-10 ENCOUNTER — Encounter: Payer: Self-pay | Admitting: Cardiology

## 2016-08-11 LAB — CUP PACEART REMOTE DEVICE CHECK
Brady Statistic AP VS Percent: 0.39 %
Brady Statistic AS VS Percent: 1.26 %
Brady Statistic RA Percent Paced: 13.73 %
Date Time Interrogation Session: 20180514041603
HIGH POWER IMPEDANCE MEASURED VALUE: 61 Ohm
HighPow Impedance: 50 Ohm
Implantable Lead Implant Date: 20090331
Implantable Lead Implant Date: 20090401
Implantable Lead Location: 753858
Implantable Lead Model: 6947
Implantable Pulse Generator Implant Date: 20150505
Lead Channel Impedance Value: 418 Ohm
Lead Channel Impedance Value: 532 Ohm
Lead Channel Impedance Value: 532 Ohm
Lead Channel Pacing Threshold Amplitude: 1.875 V
Lead Channel Sensing Intrinsic Amplitude: 1.75 mV
Lead Channel Sensing Intrinsic Amplitude: 20.5 mV
Lead Channel Setting Pacing Amplitude: 2.5 V
Lead Channel Setting Pacing Pulse Width: 0.4 ms
MDC IDC LEAD IMPLANT DT: 20090331
MDC IDC LEAD LOCATION: 753859
MDC IDC LEAD LOCATION: 753860
MDC IDC MSMT BATTERY REMAINING LONGEVITY: 23 mo
MDC IDC MSMT BATTERY VOLTAGE: 2.94 V
MDC IDC MSMT LEADCHNL LV IMPEDANCE VALUE: 456 Ohm
MDC IDC MSMT LEADCHNL LV IMPEDANCE VALUE: 703 Ohm
MDC IDC MSMT LEADCHNL LV PACING THRESHOLD AMPLITUDE: 1.375 V
MDC IDC MSMT LEADCHNL LV PACING THRESHOLD PULSEWIDTH: 0.4 ms
MDC IDC MSMT LEADCHNL RA PACING THRESHOLD AMPLITUDE: 0.75 V
MDC IDC MSMT LEADCHNL RA PACING THRESHOLD PULSEWIDTH: 0.4 ms
MDC IDC MSMT LEADCHNL RA SENSING INTR AMPL: 1.75 mV
MDC IDC MSMT LEADCHNL RV IMPEDANCE VALUE: 418 Ohm
MDC IDC MSMT LEADCHNL RV PACING THRESHOLD PULSEWIDTH: 0.4 ms
MDC IDC MSMT LEADCHNL RV SENSING INTR AMPL: 20.5 mV
MDC IDC SET LEADCHNL LV PACING PULSEWIDTH: 0.4 ms
MDC IDC SET LEADCHNL RA PACING AMPLITUDE: 2 V
MDC IDC SET LEADCHNL RV PACING AMPLITUDE: 3.75 V
MDC IDC SET LEADCHNL RV SENSING SENSITIVITY: 0.3 mV
MDC IDC STAT BRADY AP VP PERCENT: 13.35 %
MDC IDC STAT BRADY AS VP PERCENT: 85 %
MDC IDC STAT BRADY RV PERCENT PACED: 94.44 %

## 2016-09-17 ENCOUNTER — Other Ambulatory Visit: Payer: Self-pay

## 2016-09-17 ENCOUNTER — Other Ambulatory Visit: Payer: Self-pay | Admitting: Cardiovascular Disease

## 2016-09-17 MED ORDER — CARVEDILOL 12.5 MG PO TABS: 12.5000 mg | ORAL_TABLET | Freq: Two times a day (BID) | ORAL | 0 refills | Status: DC

## 2016-09-20 ENCOUNTER — Other Ambulatory Visit: Payer: Self-pay | Admitting: Cardiovascular Disease

## 2016-11-04 ENCOUNTER — Other Ambulatory Visit: Payer: Self-pay | Admitting: Physician Assistant

## 2016-11-04 DIAGNOSIS — M810 Age-related osteoporosis without current pathological fracture: Secondary | ICD-10-CM | POA: Diagnosis not present

## 2016-11-04 DIAGNOSIS — G894 Chronic pain syndrome: Secondary | ICD-10-CM | POA: Diagnosis not present

## 2016-11-04 DIAGNOSIS — M545 Low back pain: Secondary | ICD-10-CM | POA: Diagnosis not present

## 2016-11-04 DIAGNOSIS — R2681 Unsteadiness on feet: Secondary | ICD-10-CM | POA: Diagnosis not present

## 2016-11-04 DIAGNOSIS — E039 Hypothyroidism, unspecified: Secondary | ICD-10-CM | POA: Diagnosis not present

## 2016-11-04 DIAGNOSIS — G620 Drug-induced polyneuropathy: Secondary | ICD-10-CM | POA: Diagnosis not present

## 2016-11-04 DIAGNOSIS — M159 Polyosteoarthritis, unspecified: Secondary | ICD-10-CM | POA: Diagnosis not present

## 2016-11-08 ENCOUNTER — Ambulatory Visit (INDEPENDENT_AMBULATORY_CARE_PROVIDER_SITE_OTHER): Payer: Medicare Other | Admitting: *Deleted

## 2016-11-08 DIAGNOSIS — I428 Other cardiomyopathies: Secondary | ICD-10-CM

## 2016-11-08 DIAGNOSIS — I5042 Chronic combined systolic (congestive) and diastolic (congestive) heart failure: Secondary | ICD-10-CM

## 2016-11-09 LAB — CUP PACEART REMOTE DEVICE CHECK
Battery Remaining Longevity: 21 mo
Battery Voltage: 2.93 V
Brady Statistic AP VS Percent: 0.41 %
Brady Statistic AS VP Percent: 77.02 %
Brady Statistic RA Percent Paced: 21.8 %
Brady Statistic RV Percent Paced: 97.94 %
HIGH POWER IMPEDANCE MEASURED VALUE: 50 Ohm
HighPow Impedance: 61 Ohm
Implantable Lead Implant Date: 20090331
Implantable Lead Implant Date: 20090401
Implantable Lead Location: 753858
Implantable Lead Location: 753860
Implantable Lead Model: 4194
Implantable Lead Model: 6947
Implantable Pulse Generator Implant Date: 20150505
Lead Channel Impedance Value: 342 Ohm
Lead Channel Impedance Value: 418 Ohm
Lead Channel Impedance Value: 456 Ohm
Lead Channel Pacing Threshold Amplitude: 1.75 V
Lead Channel Sensing Intrinsic Amplitude: 1.5 mV
Lead Channel Sensing Intrinsic Amplitude: 1.5 mV
Lead Channel Setting Pacing Amplitude: 2 V
Lead Channel Setting Pacing Amplitude: 4 V
MDC IDC LEAD IMPLANT DT: 20090331
MDC IDC LEAD LOCATION: 753859
MDC IDC MSMT LEADCHNL LV IMPEDANCE VALUE: 665 Ohm
MDC IDC MSMT LEADCHNL LV PACING THRESHOLD AMPLITUDE: 1.5 V
MDC IDC MSMT LEADCHNL LV PACING THRESHOLD PULSEWIDTH: 0.4 ms
MDC IDC MSMT LEADCHNL RA IMPEDANCE VALUE: 513 Ohm
MDC IDC MSMT LEADCHNL RA PACING THRESHOLD AMPLITUDE: 0.75 V
MDC IDC MSMT LEADCHNL RA PACING THRESHOLD PULSEWIDTH: 0.4 ms
MDC IDC MSMT LEADCHNL RV IMPEDANCE VALUE: 532 Ohm
MDC IDC MSMT LEADCHNL RV PACING THRESHOLD PULSEWIDTH: 0.4 ms
MDC IDC MSMT LEADCHNL RV SENSING INTR AMPL: 21 mV
MDC IDC MSMT LEADCHNL RV SENSING INTR AMPL: 21 mV
MDC IDC SESS DTM: 20180813083722
MDC IDC SET LEADCHNL LV PACING AMPLITUDE: 2.5 V
MDC IDC SET LEADCHNL LV PACING PULSEWIDTH: 0.4 ms
MDC IDC SET LEADCHNL RV PACING PULSEWIDTH: 0.4 ms
MDC IDC SET LEADCHNL RV SENSING SENSITIVITY: 0.3 mV
MDC IDC STAT BRADY AP VP PERCENT: 21.41 %
MDC IDC STAT BRADY AS VS PERCENT: 1.16 %

## 2016-11-09 NOTE — Progress Notes (Signed)
Remote defibrillator check.  

## 2016-11-17 ENCOUNTER — Encounter: Payer: Self-pay | Admitting: Internal Medicine

## 2016-11-18 ENCOUNTER — Encounter: Payer: Self-pay | Admitting: Cardiology

## 2016-11-19 DIAGNOSIS — Z1211 Encounter for screening for malignant neoplasm of colon: Secondary | ICD-10-CM | POA: Diagnosis not present

## 2016-11-21 ENCOUNTER — Other Ambulatory Visit: Payer: Self-pay | Admitting: Cardiovascular Disease

## 2016-11-22 NOTE — Telephone Encounter (Signed)
Rx request sent to pharmacy.  

## 2016-12-08 ENCOUNTER — Encounter: Payer: Self-pay | Admitting: Internal Medicine

## 2016-12-08 ENCOUNTER — Ambulatory Visit (INDEPENDENT_AMBULATORY_CARE_PROVIDER_SITE_OTHER): Payer: Medicare Other | Admitting: Internal Medicine

## 2016-12-08 VITALS — BP 140/60 | HR 72 | Ht 62.0 in | Wt 138.0 lb

## 2016-12-08 DIAGNOSIS — Z79899 Other long term (current) drug therapy: Secondary | ICD-10-CM

## 2016-12-08 DIAGNOSIS — I5042 Chronic combined systolic (congestive) and diastolic (congestive) heart failure: Secondary | ICD-10-CM

## 2016-12-08 DIAGNOSIS — I428 Other cardiomyopathies: Secondary | ICD-10-CM

## 2016-12-08 DIAGNOSIS — Z95810 Presence of automatic (implantable) cardiac defibrillator: Secondary | ICD-10-CM | POA: Diagnosis not present

## 2016-12-08 LAB — CUP PACEART INCLINIC DEVICE CHECK
Battery Remaining Longevity: 21 mo
Battery Voltage: 2.92 V
Brady Statistic AP VS Percent: 0.56 %
Brady Statistic AS VP Percent: 70.33 %
Brady Statistic RA Percent Paced: 28.58 %
HIGH POWER IMPEDANCE MEASURED VALUE: 67 Ohm
HighPow Impedance: 52 Ohm
Implantable Lead Implant Date: 20090331
Implantable Lead Implant Date: 20090401
Implantable Lead Location: 753860
Implantable Lead Model: 4194
Implantable Lead Model: 5076
Lead Channel Impedance Value: 475 Ohm
Lead Channel Impedance Value: 532 Ohm
Lead Channel Impedance Value: 760 Ohm
Lead Channel Pacing Threshold Amplitude: 0.625 V
Lead Channel Pacing Threshold Pulse Width: 0.4 ms
Lead Channel Pacing Threshold Pulse Width: 0.4 ms
Lead Channel Sensing Intrinsic Amplitude: 2.875 mV
Lead Channel Sensing Intrinsic Amplitude: 20.875 mV
Lead Channel Setting Pacing Amplitude: 2 V
Lead Channel Setting Pacing Amplitude: 2.75 V
Lead Channel Setting Sensing Sensitivity: 0.3 mV
MDC IDC LEAD IMPLANT DT: 20090331
MDC IDC LEAD LOCATION: 753858
MDC IDC LEAD LOCATION: 753859
MDC IDC MSMT LEADCHNL LV IMPEDANCE VALUE: 399 Ohm
MDC IDC MSMT LEADCHNL LV PACING THRESHOLD AMPLITUDE: 1.625 V
MDC IDC MSMT LEADCHNL LV PACING THRESHOLD PULSEWIDTH: 0.4 ms
MDC IDC MSMT LEADCHNL RA SENSING INTR AMPL: 1.75 mV
MDC IDC MSMT LEADCHNL RV IMPEDANCE VALUE: 456 Ohm
MDC IDC MSMT LEADCHNL RV IMPEDANCE VALUE: 551 Ohm
MDC IDC MSMT LEADCHNL RV PACING THRESHOLD AMPLITUDE: 1.625 V
MDC IDC MSMT LEADCHNL RV SENSING INTR AMPL: 22.125 mV
MDC IDC PG IMPLANT DT: 20150505
MDC IDC SESS DTM: 20180912164605
MDC IDC SET LEADCHNL LV PACING PULSEWIDTH: 0.4 ms
MDC IDC SET LEADCHNL RV PACING AMPLITUDE: 2.5 V
MDC IDC SET LEADCHNL RV PACING PULSEWIDTH: 1 ms
MDC IDC STAT BRADY AP VP PERCENT: 28.07 %
MDC IDC STAT BRADY AS VS PERCENT: 1.05 %
MDC IDC STAT BRADY RV PERCENT PACED: 96.81 %

## 2016-12-08 MED ORDER — ASPIRIN EC 81 MG PO TBEC: 162.0000 mg | DELAYED_RELEASE_TABLET | Freq: Every day | ORAL | 3 refills | Status: DC

## 2016-12-08 NOTE — Patient Instructions (Addendum)
Medication Instructions: Your physician has recommended you make the following change in your medication:  -1) DECREASE Aspirin 81 mg - Take 2 tablet (162 mg) by mouth daily   Labwork: Your physician recommends that you have lab work today: Digoxin level  Procedures/Testing: None Ordered  Follow-Up: Your physician wants you to follow-up in: 1 YEAR with Dr. Caryl Comes. You will receive a reminder letter in the mail two months in advance. If you don't receive a letter, please call our office to schedule the follow-up appointment.  Remote monitoring is used to monitor your ICD from home. This monitoring reduces the number of office visits required to check your device to one time per year. It allows [name] to keep an eye on the functioning of your device to ensure it is working properly. You are scheduled for a device check from home on 02/07/17. You may send your transmission at any time that day. If you have a wireless device, the transmission will be sent automatically. After your physician reviews your transmission, you will receive a postcard with your next transmission date.    If you need a refill on your cardiac medications before your next appointment, please call your pharmacy.  Marland Kitchen

## 2016-12-08 NOTE — Progress Notes (Signed)
Patient Care Team: Glenford Bayley, DO as PCP - General (Family Medicine) Croitoru, Dani Gobble, MD as Consulting Physician (Cardiology)   HPI  [name] is a 83 y.o. female Seen in followup for CRT-D initially implanted 0932 complicated by a left ventricular lead dislodgment. Apparently there has been intercurrent normalization of LV function. She underwent gen change 5/15   She last saw Dr Thermon Leyland 1/17 and lost to his followup;  Seen in EP following discharge 7/17 for dysarthria with rapid improvement.  CT neg x 2 and thought unlikely 2/2 TIA given time to resolution.  Still Rx with ASA 325     Past Medical History:  Diagnosis Date  . AICD (automatic cardioverter/defibrillator) present 06/27/2007   medtronic  . Cancer ([place])    ovarian--stage 4  . Cardiomyopathy ([place])   . CHF (congestive heart failure) ([place])   . Peripheral neuropathy   . Systemic hypertension   . Thyroid disease     Past Surgical History:  Procedure Laterality Date  . ABDOMINAL HYSTERECTOMY  oct 2000   along with ovarian ca surg  . APPENDECTOMY    . CARDIAC CATHETERIZATION  09/27/1990   normal coronaries,dilated cardiomyopathy  . CARDIAC DEFIBRILLATOR PLACEMENT  06/27/2007   medtronic concerto  . CHOLECYSTECTOMY    . IMPLANTABLE CARDIOVERTER DEFIBRILLATOR GENERATOR CHANGE N/A 07/31/2013   Procedure: IMPLANTABLE CARDIOVERTER DEFIBRILLATOR GENERATOR CHANGE;  Surgeon: Sanda , MD;  Location: [place] CATH LAB;  Service: Cardiovascular;  Laterality: N/A;  . NM MYOCAR PERF WALL MOTION  06/05/2007   no ischemia    Current Outpatient Prescriptions  Medication Sig Dispense Refill  . alendronate (FOSAMAX) 70 MG tablet Take 70 mg by mouth once a week. Sundays    . aspirin EC 325 MG EC tablet Take 1 tablet (325 mg total) by mouth daily. 30 tablet 0  . calcium-vitamin D (OSCAL WITH D) 250-125 MG-UNIT per tablet Take 2 tablets by mouth daily.     . carvedilol (COREG) 12.5 MG tablet Take 1 tablet (12.5 mg total) by mouth 2  (two) times daily with a meal. Please call [phone] to schedule an appointment for additional refills thanks. 180 tablet 0  . carvedilol (COREG) 12.5 MG tablet TAKE 1 TABLET BY MOUTH TWICE A DAY WITH FOOD 180 tablet 1  . co-enzyme Q-10 50 MG capsule Take 50 mg by mouth daily. Reported on 09/29/2015    . COMBIGAN 0.2-0.5 % ophthalmic solution Place 1 drop into both eyes every 12 (twelve) hours.     Marland Kitchen DIGOX 125 MCG tablet TAKE 1 TABLET DAILY (DOSE DECREASED) 30 tablet 0  . DULERA 200-5 MCG/ACT AERO Inhale 2 puffs into the lungs 2 (two) times daily.  3  . DULoxetine (CYMBALTA) 30 MG capsule Take 30 mg by mouth 2 (two) times daily.   2  . feeding supplement, ENSURE ENLIVE, (ENSURE ENLIVE) LIQD Take 237 mLs by mouth 2 (two) times daily between meals. 60 Bottle 0  . furosemide (LASIX) 20 MG tablet TAKE ONE-HALF (1/2) TABLET DAILY 45 tablet 6  . latanoprost (XALATAN) 0.005 % ophthalmic solution Place 1 drop into both eyes at bedtime.  6  . liothyronine (CYTOMEL) 25 MCG tablet Take 25 mcg by mouth daily.    Marland Kitchen losartan (COZAAR) 100 MG tablet Take 1 tablet (100 mg total) by mouth daily. Please schedule appointment for refills. 90 tablet 0  . oxyCODONE (ROXICODONE) 15 MG immediate release tablet Take 15 mg by mouth every 6 (six) hours as needed  for pain.   0  . SYNTHROID 112 MCG tablet Take 112 mcg by mouth daily before breakfast.      No current facility-administered medications for this visit.     No Known Allergies  Review of Systems negative except from HPI and PMH  Physical Exam BP 140/60   Pulse 72   Ht 5\' 2"  (1.575 m)   Wt 138 lb (62.6 kg)   SpO2 91%   BMI 25.24 kg/m  Well developed and nourished in no acute distress HENT normal Neck supple with JVP-flat Carotids brisk and full without bruits Clear Regular rate and rhythm, no murmurs or gallops Abd-soft with active BS without hepatomegaly No Clubbing cyanosis edema Skin-warm and dry A & Oriented  Grossly normal sensory and motor  function  ECG  Sinus P-synchronous/ AV Biventricular pacing    ASSESSMENT AND PLAN  Chronic combined systolic and diastolic CHF, NYHA class 2   .  Biventricular ICD Medtronic Concerto implanted 0174    Nonischemic cardiomyopathy    Will stop dig  decrease ASA 325>>162  Normal device function

## 2016-12-09 LAB — DIGOXIN LEVEL: DIGOXIN, SERUM: 1 ng/mL — AB (ref 0.5–0.9)

## 2017-02-03 DIAGNOSIS — G620 Drug-induced polyneuropathy: Secondary | ICD-10-CM | POA: Diagnosis not present

## 2017-02-03 DIAGNOSIS — E039 Hypothyroidism, unspecified: Secondary | ICD-10-CM | POA: Diagnosis not present

## 2017-02-03 DIAGNOSIS — G894 Chronic pain syndrome: Secondary | ICD-10-CM | POA: Diagnosis not present

## 2017-02-03 DIAGNOSIS — Z23 Encounter for immunization: Secondary | ICD-10-CM | POA: Diagnosis not present

## 2017-02-07 ENCOUNTER — Ambulatory Visit (INDEPENDENT_AMBULATORY_CARE_PROVIDER_SITE_OTHER): Payer: Medicare Other | Admitting: *Deleted

## 2017-02-07 DIAGNOSIS — I428 Other cardiomyopathies: Secondary | ICD-10-CM | POA: Diagnosis not present

## 2017-02-07 NOTE — Progress Notes (Signed)
Remote ICD transmission.   

## 2017-02-08 LAB — CUP PACEART REMOTE DEVICE CHECK
Battery Remaining Longevity: 19 mo
Brady Statistic AS VS Percent: 1.32 %
Date Time Interrogation Session: 20181112102723
HIGH POWER IMPEDANCE MEASURED VALUE: 46 Ohm
HIGH POWER IMPEDANCE MEASURED VALUE: 57 Ohm
Implantable Lead Implant Date: 20090331
Implantable Lead Implant Date: 20090331
Implantable Lead Location: 753858
Implantable Lead Location: 753859
Implantable Lead Location: 753860
Implantable Lead Model: 6947
Implantable Pulse Generator Implant Date: 20150505
Lead Channel Impedance Value: 399 Ohm
Lead Channel Pacing Threshold Amplitude: 1.5 V
Lead Channel Pacing Threshold Amplitude: 1.625 V
Lead Channel Pacing Threshold Pulse Width: 0.4 ms
Lead Channel Sensing Intrinsic Amplitude: 1.75 mV
Lead Channel Sensing Intrinsic Amplitude: 21.875 mV
Lead Channel Sensing Intrinsic Amplitude: 21.875 mV
Lead Channel Setting Pacing Amplitude: 3.25 V
Lead Channel Setting Pacing Pulse Width: 0.4 ms
MDC IDC LEAD IMPLANT DT: 20090401
MDC IDC MSMT BATTERY VOLTAGE: 2.92 V
MDC IDC MSMT LEADCHNL LV IMPEDANCE VALUE: 399 Ohm
MDC IDC MSMT LEADCHNL LV IMPEDANCE VALUE: 418 Ohm
MDC IDC MSMT LEADCHNL LV IMPEDANCE VALUE: 665 Ohm
MDC IDC MSMT LEADCHNL RA IMPEDANCE VALUE: 513 Ohm
MDC IDC MSMT LEADCHNL RA PACING THRESHOLD AMPLITUDE: 0.75 V
MDC IDC MSMT LEADCHNL RA PACING THRESHOLD PULSEWIDTH: 0.4 ms
MDC IDC MSMT LEADCHNL RA SENSING INTR AMPL: 1.75 mV
MDC IDC MSMT LEADCHNL RV IMPEDANCE VALUE: 513 Ohm
MDC IDC MSMT LEADCHNL RV PACING THRESHOLD PULSEWIDTH: 0.4 ms
MDC IDC SET LEADCHNL LV PACING AMPLITUDE: 2.5 V
MDC IDC SET LEADCHNL RA PACING AMPLITUDE: 2 V
MDC IDC SET LEADCHNL RV PACING PULSEWIDTH: 0.4 ms
MDC IDC SET LEADCHNL RV SENSING SENSITIVITY: 0.3 mV
MDC IDC STAT BRADY AP VP PERCENT: 8.97 %
MDC IDC STAT BRADY AP VS PERCENT: 0.19 %
MDC IDC STAT BRADY AS VP PERCENT: 89.52 %
MDC IDC STAT BRADY RA PERCENT PACED: 9.15 %
MDC IDC STAT BRADY RV PERCENT PACED: 98.21 %

## 2017-02-11 ENCOUNTER — Encounter: Payer: Self-pay | Admitting: Cardiology

## 2017-02-21 ENCOUNTER — Other Ambulatory Visit: Payer: Self-pay | Admitting: Cardiovascular Disease

## 2017-03-09 ENCOUNTER — Other Ambulatory Visit: Payer: Self-pay | Admitting: Cardiovascular Disease

## 2017-04-08 ENCOUNTER — Other Ambulatory Visit: Payer: Self-pay

## 2017-04-08 MED ORDER — DIGOXIN 125 MCG PO TABS: ORAL_TABLET | ORAL | 0 refills | Status: DC

## 2017-05-04 ENCOUNTER — Other Ambulatory Visit: Payer: Self-pay | Admitting: Cardiovascular Disease

## 2017-05-06 DIAGNOSIS — M25521 Pain in right elbow: Secondary | ICD-10-CM | POA: Diagnosis not present

## 2017-05-06 DIAGNOSIS — S60211A Contusion of right wrist, initial encounter: Secondary | ICD-10-CM | POA: Diagnosis not present

## 2017-05-06 DIAGNOSIS — M25531 Pain in right wrist: Secondary | ICD-10-CM | POA: Diagnosis not present

## 2017-05-06 DIAGNOSIS — M25511 Pain in right shoulder: Secondary | ICD-10-CM | POA: Diagnosis not present

## 2017-05-06 DIAGNOSIS — W19XXXA Unspecified fall, initial encounter: Secondary | ICD-10-CM | POA: Diagnosis not present

## 2017-05-09 ENCOUNTER — Ambulatory Visit (INDEPENDENT_AMBULATORY_CARE_PROVIDER_SITE_OTHER): Payer: Medicare Other | Admitting: *Deleted

## 2017-05-09 ENCOUNTER — Telehealth: Payer: Self-pay | Admitting: Cardiology

## 2017-05-09 DIAGNOSIS — I428 Other cardiomyopathies: Secondary | ICD-10-CM

## 2017-05-09 NOTE — Telephone Encounter (Signed)
Spoke with pt and reminded pt of remote transmission that is due today. Pt verbalized understanding.   

## 2017-05-10 NOTE — Progress Notes (Signed)
Remote ICD transmission.   

## 2017-05-11 ENCOUNTER — Encounter: Payer: Self-pay | Admitting: Cardiology

## 2017-05-12 DIAGNOSIS — S52531A Colles' fracture of right radius, initial encounter for closed fracture: Secondary | ICD-10-CM | POA: Diagnosis not present

## 2017-05-17 LAB — CUP PACEART REMOTE DEVICE CHECK
Battery Remaining Longevity: 19 mo
Brady Statistic AS VS Percent: 1.34 %
Date Time Interrogation Session: 20190212002249
HIGH POWER IMPEDANCE MEASURED VALUE: 47 Ohm
HIGH POWER IMPEDANCE MEASURED VALUE: 62 Ohm
Implantable Lead Implant Date: 20090331
Implantable Lead Location: 753858
Implantable Lead Location: 753859
Implantable Lead Location: 753860
Implantable Lead Model: 6947
Implantable Pulse Generator Implant Date: 20150505
Lead Channel Impedance Value: 399 Ohm
Lead Channel Impedance Value: 456 Ohm
Lead Channel Pacing Threshold Amplitude: 1.625 V
Lead Channel Pacing Threshold Amplitude: 1.75 V
Lead Channel Pacing Threshold Pulse Width: 0.4 ms
Lead Channel Pacing Threshold Pulse Width: 0.4 ms
Lead Channel Sensing Intrinsic Amplitude: 1.75 mV
Lead Channel Sensing Intrinsic Amplitude: 19.75 mV
Lead Channel Sensing Intrinsic Amplitude: 19.75 mV
Lead Channel Setting Pacing Amplitude: 3.25 V
Lead Channel Setting Pacing Pulse Width: 0.4 ms
MDC IDC LEAD IMPLANT DT: 20090331
MDC IDC LEAD IMPLANT DT: 20090401
MDC IDC MSMT BATTERY VOLTAGE: 2.91 V
MDC IDC MSMT LEADCHNL LV IMPEDANCE VALUE: 513 Ohm
MDC IDC MSMT LEADCHNL LV IMPEDANCE VALUE: 722 Ohm
MDC IDC MSMT LEADCHNL RA IMPEDANCE VALUE: 532 Ohm
MDC IDC MSMT LEADCHNL RA PACING THRESHOLD AMPLITUDE: 0.875 V
MDC IDC MSMT LEADCHNL RA PACING THRESHOLD PULSEWIDTH: 0.4 ms
MDC IDC MSMT LEADCHNL RA SENSING INTR AMPL: 1.75 mV
MDC IDC MSMT LEADCHNL RV IMPEDANCE VALUE: 513 Ohm
MDC IDC SET LEADCHNL LV PACING AMPLITUDE: 2.75 V
MDC IDC SET LEADCHNL RA PACING AMPLITUDE: 2 V
MDC IDC SET LEADCHNL RV PACING PULSEWIDTH: 0.4 ms
MDC IDC SET LEADCHNL RV SENSING SENSITIVITY: 0.3 mV
MDC IDC STAT BRADY AP VP PERCENT: 3.61 %
MDC IDC STAT BRADY AP VS PERCENT: 0.1 %
MDC IDC STAT BRADY AS VP PERCENT: 94.94 %
MDC IDC STAT BRADY RA PERCENT PACED: 3.71 %
MDC IDC STAT BRADY RV PERCENT PACED: 98.4 %

## 2017-05-18 ENCOUNTER — Other Ambulatory Visit: Payer: Self-pay | Admitting: Cardiovascular Disease

## 2017-05-19 DIAGNOSIS — G894 Chronic pain syndrome: Secondary | ICD-10-CM | POA: Diagnosis not present

## 2017-05-19 DIAGNOSIS — M25531 Pain in right wrist: Secondary | ICD-10-CM | POA: Diagnosis not present

## 2017-05-19 DIAGNOSIS — R2681 Unsteadiness on feet: Secondary | ICD-10-CM | POA: Diagnosis not present

## 2017-05-19 DIAGNOSIS — M159 Polyosteoarthritis, unspecified: Secondary | ICD-10-CM | POA: Diagnosis not present

## 2017-05-19 DIAGNOSIS — G620 Drug-induced polyneuropathy: Secondary | ICD-10-CM | POA: Diagnosis not present

## 2017-05-19 NOTE — Telephone Encounter (Signed)
Rx has been sent to the pharmacy electronically. ° °

## 2017-05-21 ENCOUNTER — Other Ambulatory Visit: Payer: Self-pay | Admitting: Cardiovascular Disease

## 2017-05-23 DIAGNOSIS — S6991XD Unspecified injury of right wrist, hand and finger(s), subsequent encounter: Secondary | ICD-10-CM | POA: Diagnosis not present

## 2017-06-01 DIAGNOSIS — S52501D Unspecified fracture of the lower end of right radius, subsequent encounter for closed fracture with routine healing: Secondary | ICD-10-CM | POA: Diagnosis not present

## 2017-06-09 ENCOUNTER — Other Ambulatory Visit: Payer: Self-pay | Admitting: Cardiovascular Disease

## 2017-06-09 NOTE — Telephone Encounter (Signed)
°*  STAT* If patient is at the pharmacy, call can be transferred to refill team.   1. Which medications need to be refilled? (please list name of each medication and dose if known) Digoxin  2. Which pharmacy/location (including street and city if local pharmacy) is medication to be sent to?[facility]  3. Do they need a 30 day or 90 day supply? 90 and refills

## 2017-06-10 ENCOUNTER — Telehealth: Payer: Self-pay

## 2017-06-10 MED ORDER — DIGOXIN 125 MCG PO TABS: ORAL_TABLET | ORAL | 3 refills | Status: DC

## 2017-06-10 NOTE — Telephone Encounter (Signed)
Pt is up to date on appt. Next follow up appt due in 11/2017.

## 2017-06-29 DIAGNOSIS — S52501D Unspecified fracture of the lower end of right radius, subsequent encounter for closed fracture with routine healing: Secondary | ICD-10-CM | POA: Diagnosis not present

## 2017-06-29 DIAGNOSIS — M25531 Pain in right wrist: Secondary | ICD-10-CM | POA: Diagnosis not present

## 2017-06-29 DIAGNOSIS — S52509A Unspecified fracture of the lower end of unspecified radius, initial encounter for closed fracture: Secondary | ICD-10-CM | POA: Insufficient documentation

## 2017-08-02 ENCOUNTER — Other Ambulatory Visit: Payer: Self-pay | Admitting: Cardiovascular Disease

## 2017-08-03 NOTE — Telephone Encounter (Signed)
Rx(s) sent to pharmacy electronically.  

## 2017-08-08 ENCOUNTER — Ambulatory Visit (INDEPENDENT_AMBULATORY_CARE_PROVIDER_SITE_OTHER): Payer: Medicare Other | Admitting: *Deleted

## 2017-08-08 DIAGNOSIS — I428 Other cardiomyopathies: Secondary | ICD-10-CM | POA: Diagnosis not present

## 2017-08-09 LAB — CUP PACEART REMOTE DEVICE CHECK
Battery Voltage: 2.92 V
Brady Statistic AP VS Percent: 0.09 %
Brady Statistic RA Percent Paced: 4.56 %
Date Time Interrogation Session: 20190513052303
HIGH POWER IMPEDANCE MEASURED VALUE: 62 Ohm
HighPow Impedance: 47 Ohm
Implantable Lead Implant Date: 20090331
Implantable Lead Location: 753860
Implantable Pulse Generator Implant Date: 20150505
Lead Channel Impedance Value: 361 Ohm
Lead Channel Impedance Value: 532 Ohm
Lead Channel Pacing Threshold Pulse Width: 0.4 ms
Lead Channel Sensing Intrinsic Amplitude: 2 mV
Lead Channel Sensing Intrinsic Amplitude: 2 mV
Lead Channel Sensing Intrinsic Amplitude: 21.125 mV
Lead Channel Sensing Intrinsic Amplitude: 21.125 mV
Lead Channel Setting Pacing Amplitude: 2.25 V
Lead Channel Setting Pacing Pulse Width: 0.4 ms
MDC IDC LEAD IMPLANT DT: 20090331
MDC IDC LEAD IMPLANT DT: 20090401
MDC IDC LEAD LOCATION: 753858
MDC IDC LEAD LOCATION: 753859
MDC IDC MSMT BATTERY REMAINING LONGEVITY: 18 mo
MDC IDC MSMT LEADCHNL LV IMPEDANCE VALUE: 418 Ohm
MDC IDC MSMT LEADCHNL LV IMPEDANCE VALUE: 608 Ohm
MDC IDC MSMT LEADCHNL LV PACING THRESHOLD AMPLITUDE: 1.25 V
MDC IDC MSMT LEADCHNL LV PACING THRESHOLD PULSEWIDTH: 0.4 ms
MDC IDC MSMT LEADCHNL RA PACING THRESHOLD AMPLITUDE: 0.75 V
MDC IDC MSMT LEADCHNL RA PACING THRESHOLD PULSEWIDTH: 0.4 ms
MDC IDC MSMT LEADCHNL RV IMPEDANCE VALUE: 399 Ohm
MDC IDC MSMT LEADCHNL RV IMPEDANCE VALUE: 475 Ohm
MDC IDC MSMT LEADCHNL RV PACING THRESHOLD AMPLITUDE: 1.375 V
MDC IDC SET LEADCHNL RA PACING AMPLITUDE: 2 V
MDC IDC SET LEADCHNL RV PACING AMPLITUDE: 3.25 V
MDC IDC SET LEADCHNL RV PACING PULSEWIDTH: 0.4 ms
MDC IDC SET LEADCHNL RV SENSING SENSITIVITY: 0.3 mV
MDC IDC STAT BRADY AP VP PERCENT: 4.47 %
MDC IDC STAT BRADY AS VP PERCENT: 94.06 %
MDC IDC STAT BRADY AS VS PERCENT: 1.38 %
MDC IDC STAT BRADY RV PERCENT PACED: 98.28 %

## 2017-08-09 NOTE — Progress Notes (Signed)
Remote ICD transmission.   

## 2017-08-10 ENCOUNTER — Encounter: Payer: Self-pay | Admitting: Cardiology

## 2017-08-16 DIAGNOSIS — R2681 Unsteadiness on feet: Secondary | ICD-10-CM | POA: Diagnosis not present

## 2017-08-16 DIAGNOSIS — G894 Chronic pain syndrome: Secondary | ICD-10-CM | POA: Diagnosis not present

## 2017-08-16 DIAGNOSIS — Z8719 Personal history of other diseases of the digestive system: Secondary | ICD-10-CM | POA: Diagnosis not present

## 2017-08-16 DIAGNOSIS — M810 Age-related osteoporosis without current pathological fracture: Secondary | ICD-10-CM | POA: Diagnosis not present

## 2017-08-16 DIAGNOSIS — E038 Other specified hypothyroidism: Secondary | ICD-10-CM | POA: Diagnosis not present

## 2017-08-16 DIAGNOSIS — G620 Drug-induced polyneuropathy: Secondary | ICD-10-CM | POA: Diagnosis not present

## 2017-08-16 DIAGNOSIS — R112 Nausea with vomiting, unspecified: Secondary | ICD-10-CM | POA: Diagnosis not present

## 2017-08-31 ENCOUNTER — Encounter: Payer: Self-pay | Admitting: Gastroenterology

## 2017-09-13 ENCOUNTER — Ambulatory Visit (HOSPITAL_BASED_OUTPATIENT_CLINIC_OR_DEPARTMENT_OTHER)
Admission: RE | Admit: 2017-09-13 | Discharge: 2017-09-13 | Disposition: A | Discharge status: Home / Self Care | Payer: Medicare Other | Source: Ambulatory Visit | Attending: Family Medicine | Admitting: Family Medicine

## 2017-09-13 ENCOUNTER — Other Ambulatory Visit: Payer: Self-pay | Admitting: Family Medicine

## 2017-09-13 DIAGNOSIS — R609 Edema, unspecified: Secondary | ICD-10-CM

## 2017-09-13 DIAGNOSIS — M7989 Other specified soft tissue disorders: Secondary | ICD-10-CM | POA: Diagnosis not present

## 2017-09-13 DIAGNOSIS — R52 Pain, unspecified: Secondary | ICD-10-CM

## 2017-09-13 DIAGNOSIS — I428 Other cardiomyopathies: Secondary | ICD-10-CM | POA: Diagnosis not present

## 2017-09-13 DIAGNOSIS — R0602 Shortness of breath: Secondary | ICD-10-CM | POA: Diagnosis not present

## 2017-09-13 DIAGNOSIS — R600 Localized edema: Secondary | ICD-10-CM | POA: Diagnosis not present

## 2017-09-25 ENCOUNTER — Other Ambulatory Visit: Payer: Self-pay | Admitting: Internal Medicine

## 2017-09-26 DIAGNOSIS — N179 Acute kidney failure, unspecified: Secondary | ICD-10-CM

## 2017-09-26 HISTORY — DX: Acute kidney failure, unspecified: N17.9

## 2017-09-30 ENCOUNTER — Telehealth: Payer: Self-pay | Admitting: Cardiovascular Disease

## 2017-09-30 DIAGNOSIS — I10 Essential (primary) hypertension: Secondary | ICD-10-CM | POA: Diagnosis not present

## 2017-09-30 DIAGNOSIS — R635 Abnormal weight gain: Secondary | ICD-10-CM | POA: Diagnosis not present

## 2017-09-30 DIAGNOSIS — E039 Hypothyroidism, unspecified: Secondary | ICD-10-CM | POA: Diagnosis not present

## 2017-09-30 DIAGNOSIS — R7989 Other specified abnormal findings of blood chemistry: Secondary | ICD-10-CM | POA: Diagnosis not present

## 2017-09-30 DIAGNOSIS — I428 Other cardiomyopathies: Secondary | ICD-10-CM | POA: Diagnosis not present

## 2017-09-30 DIAGNOSIS — R4189 Other symptoms and signs involving cognitive functions and awareness: Secondary | ICD-10-CM | POA: Diagnosis not present

## 2017-09-30 NOTE — Telephone Encounter (Signed)
Spoke with pt, she reports SOB with walking to the car. She also reports orthopnea. She has swelling in the right calf and ankle, it does not hurt to walk and the leg is a reddish color. Her weight is up maybe 5 lbs this week but she is not sure. She feels she is retaining fluid. She has taken furosemide 20 mg daily for the last week. Patient instructed to take 40 mg of furosemide for the next 3 days. Patient made a follow up appointment 10-03-17.

## 2017-09-30 NOTE — Telephone Encounter (Signed)
Pt's dtr [name] calling to make appt with Dr. Loletha Grayer per her PCP, pt having SOB on exertionnot much at all to get her SOB, dtr off work 7-8 and  7-9 but schedule full-pls advise, call p,t dtr going to [phone]

## 2017-10-03 ENCOUNTER — Ambulatory Visit (INDEPENDENT_AMBULATORY_CARE_PROVIDER_SITE_OTHER): Payer: Medicare Other | Admitting: Cardiovascular Disease

## 2017-10-03 ENCOUNTER — Encounter: Payer: Self-pay | Admitting: Cardiovascular Disease

## 2017-10-03 VITALS — BP 162/60 | HR 60 | Ht 61.0 in | Wt 143.8 lb

## 2017-10-03 DIAGNOSIS — I428 Other cardiomyopathies: Secondary | ICD-10-CM

## 2017-10-03 DIAGNOSIS — R0602 Shortness of breath: Secondary | ICD-10-CM

## 2017-10-03 DIAGNOSIS — I5042 Chronic combined systolic (congestive) and diastolic (congestive) heart failure: Secondary | ICD-10-CM | POA: Diagnosis not present

## 2017-10-03 DIAGNOSIS — Z95810 Presence of automatic (implantable) cardiac defibrillator: Secondary | ICD-10-CM

## 2017-10-03 DIAGNOSIS — Z79899 Other long term (current) drug therapy: Secondary | ICD-10-CM

## 2017-10-03 DIAGNOSIS — I4892 Unspecified atrial flutter: Secondary | ICD-10-CM

## 2017-10-03 MED ORDER — ASPIRIN EC 81 MG PO TBEC: 81.0000 mg | DELAYED_RELEASE_TABLET | Freq: Every day | ORAL | 3 refills | Status: DC

## 2017-10-03 NOTE — Patient Instructions (Signed)
Medication Instructions: Dr Sallyanne Kuster has recommended making the following medication changes: 1. DECREASE Aspirin to 81 mg daily 2. DECREASE Furosemide to 10 mg daily 3. DO NOT TAKE Digoxin until you hear about your lab results  Labwork: Your physician recommends that you return for lab work TODAY.  Testing/Procedures: 1. Echocardiogram - Your physician has requested that you have an echocardiogram. Echocardiography is a painless test that uses sound waves to create images of your heart. It provides your doctor with information about the size and shape of your heart and how well your heart's chambers and valves are working. This procedure takes approximately one hour. There are no restrictions for this procedure. >>This has been ordered to be completed at [facility]  2. Remote ICD Transmission - Remote monitoring is used to monitor your Pacemaker or ICD from home. This monitoring reduces the number of office visits required to check your device to one time per year. It allows [name] to keep an eye on the functioning of your device to ensure it is working properly. You are scheduled for a device check from home on Monday, August 12th, 2019. You may send your transmission at any time that day. If you have a wireless device, the transmission will be sent automatically. After your physician reviews your transmission, you will receive a notification with your next transmission date.  To improve our patient care and to more adequately follow your device, [facility] has decided, as a practice, to start following each patient four times a year with your home monitor. This means that you may experience a remote appointment that is close to an in-office appointment with your physician. Your insurance will apply at the same rate as other remote monitoring transmissions.  Follow-up: Dr Sallyanne Kuster recommends that you schedule a follow-up appointment in 6 months with an ICD check. You will receive a  reminder letter in the mail two months in advance. If you don't receive a letter, please call our office to schedule the follow-up appointment.  If you need a refill on your cardiac medications before your next appointment, please call your pharmacy.   Weigh daily. Call [phone] if weight climbs more than 3 pounds from today's weight. No salt to very little salt in your diet.  No more than 2000 mg in a day. Call if increased shortness of breath or increased swelling. Reduce fluid intake to 1500 mL (50 ounces) daily.

## 2017-10-03 NOTE — Progress Notes (Signed)
Patient ID: [name], female   DOB: 1929-03-22, 84 y.o.   MRN: [id number]    Cardiology Office Note    Date:  10/03/2017   ID:  [name], DOB [birth year], MRN [id number]  PCP:  Glenford Bayley, DO  Cardiologist:  Jolyn Nap, MD;  Sanda Klein, MD   Chief Complaint  Patient presents with  . Congestive Heart Failure    Fatigue, lower extremity edema    History of Present Illness:  [name] is a 84 y.o. female with CHF due to nonischemic cardiomyopathy, CRT hyperresponder here for CHF.  She has been doing less well over the last month.  She developed shortness of breath and worsening leg edema, with the right leg disproportionately more swollen than the left (which is her chronic pattern).  She underwent a lower extremity duplex study that showed no evidence of DVT and her dose of diuretic was increased.  Shortness of breath has resolved and the edema has improved although it is far from completely gone.  She has now become tired and forgetful, a pattern that her daughter says occurred in the past when she was dehydrated.  She was on a dose of furosemide 60 mg daily for 3 days, completed today (according to her daughter, although the notes from our office from July 5 state that she was instructed to take 40 mg daily).  Her usual dose of furosemide is just 10 mg once daily.  Her daughter [name] also mentions something about increasing the dose of digoxin, although she appears to be uncertain about this.  She also mentions problems with her sodium being low, with the labs that I got from [facility] are from June 18 and showed a sodium that was only borderline low at 133.  Potassium was 4.6, creatinine 0.72.  Her digoxin level was most recently checked in September 2018 and was 1.0.  Her weight has fluctuated a lot since her husband passed away about a year and a half ago.  Her home scale is reportedly unreliable and shows about 130 pounds.  This is very different from our office scale which  shows 143 pounds in the scale that he will do showed 141-142 pounds.  Her weight today substantially higher than the 127 pounds that she weighed when I last saw her in clinic in January 2017.  She denies orthopnea or PND.  She moves very very slowly using a walker, bent over to almost 90 degrees, but does not have dyspnea with that level of activity.  Reported left ventricular ejection fraction as low as 20% in the past. She had normal coronary arteries by catheterization in 1992. After implementation of resynchronization pacemaker therapy in 2009 her ejection fraction has returned to normal.  Last echo June 2017 showed EF 65-70%.  She had a Nature conservation officer in May 2015 (Medtronic Viva XT). She is not pacemaker dependent. She has COPD with chronic dyspnea, hyperlipidemia and a history of neuropathy secondary to chemotherapy.  Normal device check. Generator expected longevity 1.5 years.  She has 98.3 % BiV pacing.  Her activity level has been drastically lower in the last 1 month.  Optivol was abnormal in June, but has returned to baseline.  There has been a marked increase in the frequency of atrial pacing over the last month, synchronous with her reduced activity.  This was a ventricular tachycardia or atrial fibrillation.  Past Medical History:  Diagnosis Date  . AICD (automatic cardioverter/defibrillator) present 06/27/2007   medtronic  .  Cancer ([place])    ovarian--stage 4  . Cardiomyopathy ([place])   . CHF (congestive heart failure) ([place])   . Peripheral neuropathy   . Systemic hypertension   . Thyroid disease     Past Surgical History:  Procedure Laterality Date  . ABDOMINAL HYSTERECTOMY  oct 2000   along with ovarian ca surg  . APPENDECTOMY    . CARDIAC CATHETERIZATION  09/27/1990   normal coronaries,dilated cardiomyopathy  . CARDIAC DEFIBRILLATOR PLACEMENT  06/27/2007   medtronic concerto  . CHOLECYSTECTOMY    . IMPLANTABLE CARDIOVERTER DEFIBRILLATOR GENERATOR CHANGE N/A 07/31/2013    Procedure: IMPLANTABLE CARDIOVERTER DEFIBRILLATOR GENERATOR CHANGE;  Surgeon: Sanda Klein, MD;  Location: [facility]ATH [facility];  Service: Cardiovascular;  Laterality: N/A;  . NM MYOCAR PERF WALL MOTION  06/05/2007   no ischemia    Outpatient Medications Prior to Visit  Medication Sig Dispense Refill  . alendronate (FOSAMAX) 70 MG tablet Take 70 mg by mouth once a week. Sundays    . calcium-vitamin D (OSCAL WITH D) 250-125 MG-UNIT per tablet Take 2 tablets by mouth daily.     . carvedilol (COREG) 12.5 MG tablet Take 1 tablet (12.5 mg total) by mouth 2 (two) times daily with a meal. Please make appt with Dr. Caryl Comes for Septembe. 1st attempt 180 tablet 0  . co-enzyme Q-10 50 MG capsule Take 50 mg by mouth daily. Reported on 09/29/2015    . digoxin (DIGOX) 0.125 MG tablet TAKE 1 TABLET DAILY (DOSE DECREASED, PLEASE KEEP UPCOMING APPOINTMENT WITH DOCTOR KLEIN) 90 tablet 3  . DULERA 200-5 MCG/ACT AERO Inhale 2 puffs into the lungs 2 (two) times daily.  3  . DULoxetine (CYMBALTA) 30 MG capsule Take 30 mg by mouth 2 (two) times daily.   2  . furosemide (LASIX) 20 MG tablet Take 0.5 tablets (10 mg total) by mouth daily. NEEDS APPOINTMENT WITH DR Sallyanne Kuster FOR FUTURE REFILLS 15 tablet 0  . liothyronine (CYTOMEL) 25 MCG tablet Take 25 mcg by mouth daily.    Marland Kitchen losartan (COZAAR) 100 MG tablet TAKE 1 TABLET DAILY (SCHEDULE APPOINTMENT FOR REFILLS) 90 tablet 0  . oxyCODONE (ROXICODONE) 15 MG immediate release tablet Take 15 mg by mouth every 6 (six) hours as needed for pain.   0  . SYNTHROID 112 MCG tablet Take 112 mcg by mouth daily before breakfast.     . aspirin EC 81 MG tablet Take 2 tablets (162 mg total) by mouth daily. 90 tablet 3  . COMBIGAN 0.2-0.5 % ophthalmic solution Place 1 drop into both eyes every 12 (twelve) hours.     . feeding supplement, ENSURE ENLIVE, (ENSURE ENLIVE) LIQD Take 237 mLs by mouth 2 (two) times daily between meals. 60 Bottle 0  . latanoprost (XALATAN) 0.005 % ophthalmic solution Place 1  drop into both eyes at bedtime.  6   No facility-administered medications prior to visit.      Allergies:   Patient has no known allergies.   Social History   Socioeconomic History  . Marital status: Widowed    Spouse name: Not on file  . Number of children: Not on file  . Years of education: Not on file  . Highest education level: Not on file  Occupational History  . Occupation: housewife  Social Needs  . Financial resource strain: Not on file  . Food insecurity:    Worry: Not on file    Inability: Not on file  . Transportation needs:    Medical: Not on file  Non-medical: Not on file  Tobacco Use  . Smoking status: Never Smoker  . Smokeless tobacco: Never Used  Substance and Sexual Activity  . Alcohol use: No  . Drug use: No  . Sexual activity: Not on file  Lifestyle  . Physical activity:    Days per week: Not on file    Minutes per session: Not on file  . Stress: Not on file  Relationships  . Social connections:    Talks on phone: Not on file    Gets together: Not on file    Attends religious service: Not on file    Active member of club or organization: Not on file    Attends meetings of clubs or organizations: Not on file    Relationship status: Not on file  Other Topics Concern  . Not on file  Social History Narrative  . Not on file     Family History:  The patient's family history includes Heart attack in her father; Heart disease in her paternal grandmother; Lung cancer in her father and mother.   ROS:   Please see the history of present illness.    ROS All other systems reviewed and are negative.   PHYSICAL EXAM:   VS:  BP (!) 162/60   Pulse 60   Ht 5\' 1"  (1.549 m)   Wt 143 lb 12.8 oz (65.2 kg)   BMI 27.17 kg/m     General:  no distress, appears more sluggish than I remember her.  Fully alert and oriented, but a little hesitant and answers. Head: no evidence of trauma, PERRL, EOMI, no exophtalmos or lid lag, no myxedema, no xanthelasma;  normal ears, nose and oropharynx Neck: 8-10 cm elevation in jugular venous pulsations and prompt hepatojugular reflux; brisk carotid pulses without delay and no carotid bruits Chest: clear to auscultation, no signs of consolidation by percussion or palpation, normal fremitus, symmetrical and full respiratory excursions Cardiovascular: normal position and quality of the apical impulse, regular rhythm, normal first and second heart sounds, no murmurs, rubs or gallops.  Healthy left subclavian device site Abdomen: no tenderness or distention, no masses by palpation, no abnormal pulsatility or arterial bruits, normal bowel sounds, no hepatosplenomegaly Extremities: no clubbing, cyanosis; there is 2+ pitting edema almost to the knees symmetrically (may be slightly worse on the right than on the left); 2+ radial, ulnar and brachial pulses bilaterally; 2+ right femoral, posterior tibial and dorsalis pedis pulses; 2+ left femoral, posterior tibial and dorsalis pedis pulses; no subclavian or femoral bruits Neurological: grossly nonfocal Psych: Normal mood and affect   Wt Readings from Last 3 Encounters:  10/03/17 143 lb 12.8 oz (65.2 kg)  12/08/16 138 lb (62.6 kg)  10/31/15 120 lb (54.4 kg)      Studies/Labs Reviewed:   EKG:  EKG is ordered today.  The ekg ordered today demonstrates paced, biventricular paced rhythm, positive R waves in lead V1, QTC 442 ms  Recent Labs: June 18 from [facility] Creatinine 0.72, sodium 133, potassium 4.6, glucose 101, normal liver function test, TSH 6.5  Lipid Panel    Component Value Date/Time   CHOL 135 09/30/2015 0946   TRIG 113 09/30/2015 0946   HDL 32 (L) 09/30/2015 0946   CHOLHDL 4.2 09/30/2015 0946   VLDL 23 09/30/2015 0946   LDLCALC 80 09/30/2015 0946   LDLDIRECT 111 (H) 03/06/2013 1054    Additional studies/ records that were reviewed today include:  Labs from [facility]:    1. Chronic combined  systolic and diastolic CHF, NYHA class 2 ([place])    2. Medication management   3. Nonischemic cardiomyopathy ([place])   4. Biventricular ICD (implantable cardioverter-defibrillator) in place   5. Paroxysmal atrial flutter ([facility])   6. Shortness of breath      PLAN:  In order of problems listed above:  1. CHF: On physical exam she appears to be hypervolemic and her weight has gone up.  Unfortunately, the most recent previous weight on record that I have available is from September, long time ago.  She is up 6 pounds since then.  She is up 14 pounds when compared to August 2017. Shehad normal LVEF (CRT hyper-responder) at her last echo.  OptiVol suggests she had volume overload in June, but is back to baseline.  Her daughter reports symptoms that she has previously associated with dehydration.  For the time being asked them to cut back the furosemide to her usual dose of 10 mg daily, follow a 1500 mL/day fluid restriction, repeat labs today.  Repeat echo. 2. Digoxin: Concerned that some of her symptoms may be related to digitalis toxicity.  Note marked increase in frequency of atrial pacing, suggesting suppression of the sinus node.  She will hold this drug until we get the results of the digital level.  In fact, this medication should probably be permanently discontinued with her now normal EF. 3. NICMP: Now with normal LVEF (ast echo 2017) 4. CRT-D: Normal device function. CRT percentage is good. 5. Episode of atrial flutter was only 48 seconds in duration and occurred 2 years ago. No anticoagulation at this time. Monitor via her device.     Medication Adjustments/Labs and Tests Ordered: Current medicines are reviewed at length with the patient today.  Concerns regarding medicines are outlined above.  Medication changes, Labs and Tests ordered today are listed in the Patient Instructions below. Patient Instructions  Medication Instructions: Dr Sallyanne Kuster has recommended making the following medication changes: 1. DECREASE Aspirin to 81 mg daily 2.  DECREASE Furosemide to 10 mg daily 3. DO NOT TAKE Digoxin until you hear about your lab results  Labwork: Your physician recommends that you return for lab work TODAY.  Testing/Procedures: 1. Echocardiogram - Your physician has requested that you have an echocardiogram. Echocardiography is a painless test that uses sound waves to create images of your heart. It provides your doctor with information about the size and shape of your heart and how well your heart's chambers and valves are working. This procedure takes approximately one hour. There are no restrictions for this procedure. >>This has been ordered to be completed at [facility]  2. Remote ICD Transmission - Remote monitoring is used to monitor your Pacemaker or ICD from home. This monitoring reduces the number of office visits required to check your device to one time per year. It allows [name] to keep an eye on the functioning of your device to ensure it is working properly. You are scheduled for a device check from home on Monday, August 12th, 2019. You may send your transmission at any time that day. If you have a wireless device, the transmission will be sent automatically. After your physician reviews your transmission, you will receive a notification with your next transmission date.  To improve our patient care and to more adequately follow your device, [facility] has decided, as a practice, to start following each patient four times a year with your home monitor. This means that you may experience a remote appointment that  is close to an in-office appointment with your physician. Your insurance will apply at the same rate as other remote monitoring transmissions.  Follow-up: Dr Sallyanne Kuster recommends that you schedule a follow-up appointment in 6 months with an ICD check. You will receive a reminder letter in the mail two months in advance. If you don't receive a letter, please call our office to schedule the follow-up  appointment.  If you need a refill on your cardiac medications before your next appointment, please call your pharmacy.   Weigh daily. Call [phone] if weight climbs more than 3 pounds from today's weight. No salt to very little salt in your diet.  No more than 2000 mg in a day. Call if increased shortness of breath or increased swelling. Reduce fluid intake to 1500 mL (50 ounces) daily.       Signed, Sanda Klein, MD  10/03/2017 6:59 PM    [facility] [place], [place], [place]  [address] Phone: [phone]; Fax: [phone]

## 2017-10-04 ENCOUNTER — Observation Stay (HOSPITAL_COMMUNITY)
Admission: AD | Admit: 2017-10-04 | Discharge: 2017-10-06 | Disposition: A | Discharge status: Home / Self Care | Payer: Medicare Other | Source: Ambulatory Visit | Attending: Cardiovascular Disease | Admitting: Cardiovascular Disease

## 2017-10-04 ENCOUNTER — Other Ambulatory Visit: Payer: Self-pay

## 2017-10-04 ENCOUNTER — Encounter: Payer: Self-pay | Admitting: Cardiovascular Disease

## 2017-10-04 ENCOUNTER — Telehealth: Payer: Self-pay | Admitting: Medical

## 2017-10-04 ENCOUNTER — Encounter (HOSPITAL_COMMUNITY): Payer: Self-pay | Admitting: General Practice

## 2017-10-04 DIAGNOSIS — Z79890 Hormone replacement therapy (postmenopausal): Secondary | ICD-10-CM | POA: Insufficient documentation

## 2017-10-04 DIAGNOSIS — Z79899 Other long term (current) drug therapy: Secondary | ICD-10-CM | POA: Diagnosis not present

## 2017-10-04 DIAGNOSIS — Z7982 Long term (current) use of aspirin: Secondary | ICD-10-CM | POA: Diagnosis not present

## 2017-10-04 DIAGNOSIS — T460X1A Poisoning by cardiac-stimulant glycosides and drugs of similar action, accidental (unintentional), initial encounter: Secondary | ICD-10-CM | POA: Diagnosis not present

## 2017-10-04 DIAGNOSIS — I5032 Chronic diastolic (congestive) heart failure: Secondary | ICD-10-CM | POA: Diagnosis present

## 2017-10-04 DIAGNOSIS — R0602 Shortness of breath: Secondary | ICD-10-CM

## 2017-10-04 DIAGNOSIS — Z9049 Acquired absence of other specified parts of digestive tract: Secondary | ICD-10-CM | POA: Diagnosis not present

## 2017-10-04 DIAGNOSIS — Z8249 Family history of ischemic heart disease and other diseases of the circulatory system: Secondary | ICD-10-CM | POA: Insufficient documentation

## 2017-10-04 DIAGNOSIS — E785 Hyperlipidemia, unspecified: Secondary | ICD-10-CM | POA: Diagnosis present

## 2017-10-04 DIAGNOSIS — Z90710 Acquired absence of both cervix and uterus: Secondary | ICD-10-CM | POA: Insufficient documentation

## 2017-10-04 DIAGNOSIS — E871 Hypo-osmolality and hyponatremia: Secondary | ICD-10-CM | POA: Insufficient documentation

## 2017-10-04 DIAGNOSIS — Z8543 Personal history of malignant neoplasm of ovary: Secondary | ICD-10-CM | POA: Diagnosis not present

## 2017-10-04 DIAGNOSIS — T460X5A Adverse effect of cardiac-stimulant glycosides and drugs of similar action, initial encounter: Secondary | ICD-10-CM | POA: Diagnosis not present

## 2017-10-04 DIAGNOSIS — Z801 Family history of malignant neoplasm of trachea, bronchus and lung: Secondary | ICD-10-CM | POA: Diagnosis not present

## 2017-10-04 DIAGNOSIS — G629 Polyneuropathy, unspecified: Secondary | ICD-10-CM | POA: Diagnosis not present

## 2017-10-04 DIAGNOSIS — I110 Hypertensive heart disease with heart failure: Secondary | ICD-10-CM | POA: Diagnosis not present

## 2017-10-04 DIAGNOSIS — N179 Acute kidney failure, unspecified: Secondary | ICD-10-CM | POA: Diagnosis not present

## 2017-10-04 DIAGNOSIS — I420 Dilated cardiomyopathy: Secondary | ICD-10-CM | POA: Insufficient documentation

## 2017-10-04 DIAGNOSIS — I5043 Acute on chronic combined systolic (congestive) and diastolic (congestive) heart failure: Secondary | ICD-10-CM

## 2017-10-04 DIAGNOSIS — I5042 Chronic combined systolic (congestive) and diastolic (congestive) heart failure: Secondary | ICD-10-CM | POA: Insufficient documentation

## 2017-10-04 DIAGNOSIS — Z9889 Other specified postprocedural states: Secondary | ICD-10-CM | POA: Diagnosis not present

## 2017-10-04 DIAGNOSIS — X58XXXA Exposure to other specified factors, initial encounter: Secondary | ICD-10-CM | POA: Insufficient documentation

## 2017-10-04 DIAGNOSIS — I429 Cardiomyopathy, unspecified: Secondary | ICD-10-CM | POA: Insufficient documentation

## 2017-10-04 DIAGNOSIS — Z95810 Presence of automatic (implantable) cardiac defibrillator: Secondary | ICD-10-CM | POA: Diagnosis present

## 2017-10-04 DIAGNOSIS — J439 Emphysema, unspecified: Secondary | ICD-10-CM | POA: Insufficient documentation

## 2017-10-04 DIAGNOSIS — J449 Chronic obstructive pulmonary disease, unspecified: Secondary | ICD-10-CM | POA: Diagnosis present

## 2017-10-04 DIAGNOSIS — I4892 Unspecified atrial flutter: Secondary | ICD-10-CM | POA: Diagnosis not present

## 2017-10-04 DIAGNOSIS — I10 Essential (primary) hypertension: Secondary | ICD-10-CM | POA: Diagnosis present

## 2017-10-04 DIAGNOSIS — E039 Hypothyroidism, unspecified: Secondary | ICD-10-CM | POA: Diagnosis not present

## 2017-10-04 DIAGNOSIS — I083 Combined rheumatic disorders of mitral, aortic and tricuspid valves: Secondary | ICD-10-CM | POA: Insufficient documentation

## 2017-10-04 DIAGNOSIS — I428 Other cardiomyopathies: Secondary | ICD-10-CM

## 2017-10-04 HISTORY — DX: Acute kidney failure, unspecified: N17.9

## 2017-10-04 HISTORY — DX: Dyspnea, unspecified: R06.00

## 2017-10-04 LAB — CBC WITH DIFFERENTIAL/PLATELET
Abs Immature Granulocytes: 0.1 10*3/uL (ref 0.0–0.1)
Basophils Absolute: 0.1 10*3/uL (ref 0.0–0.1)
Basophils Relative: 1 %
EOS ABS: 0.6 10*3/uL (ref 0.0–0.7)
EOS PCT: 7 %
HEMATOCRIT: 34.4 % — AB (ref 36.0–46.0)
Hemoglobin: 11.9 g/dL — ABNORMAL LOW (ref 12.0–15.0)
Immature Granulocytes: 1 %
LYMPHS ABS: 0.8 10*3/uL (ref 0.7–4.0)
Lymphocytes Relative: 10 %
MCH: 30.7 pg (ref 26.0–34.0)
MCHC: 34.6 g/dL (ref 30.0–36.0)
MCV: 88.9 fL (ref 78.0–100.0)
MONOS PCT: 9 %
Monocytes Absolute: 0.7 10*3/uL (ref 0.1–1.0)
Neutro Abs: 6.1 10*3/uL (ref 1.7–7.7)
Neutrophils Relative %: 74 %
Platelets: 137 10*3/uL — ABNORMAL LOW (ref 150–400)
RBC: 3.87 MIL/uL (ref 3.87–5.11)
RDW: 11.9 % (ref 11.5–15.5)
WBC: 8.2 10*3/uL (ref 4.0–10.5)

## 2017-10-04 LAB — TSH: TSH: 2.569 u[IU]/mL (ref 0.350–4.500)

## 2017-10-04 LAB — DIGOXIN LEVEL
DIGOXIN LVL: 2.5 ng/mL — AB (ref 0.8–2.0)
Digoxin, Serum: 3.4 ng/mL (ref 0.5–0.9)

## 2017-10-04 LAB — COMPREHENSIVE METABOLIC PANEL
ALT: 94 U/L — ABNORMAL HIGH (ref 0–44)
ANION GAP: 10 (ref 5–15)
AST: 138 U/L — ABNORMAL HIGH (ref 15–41)
Albumin: 3.4 g/dL — ABNORMAL LOW (ref 3.5–5.0)
Alkaline Phosphatase: 124 U/L (ref 38–126)
BILIRUBIN TOTAL: 0.8 mg/dL (ref 0.3–1.2)
BUN: 22 mg/dL (ref 8–23)
CO2: 30 mmol/L (ref 22–32)
Calcium: 8.8 mg/dL — ABNORMAL LOW (ref 8.9–10.3)
Chloride: 85 mmol/L — ABNORMAL LOW (ref 98–111)
Creatinine, Ser: 1.72 mg/dL — ABNORMAL HIGH (ref 0.44–1.00)
GFR calc Af Amer: 30 mL/min — ABNORMAL LOW (ref >60)
GFR, EST NON AFRICAN AMERICAN: 26 mL/min — AB (ref >60)
Glucose, Bld: 127 mg/dL — ABNORMAL HIGH (ref 70–99)
POTASSIUM: 4.6 mmol/L (ref 3.5–5.1)
Sodium: 125 mmol/L — ABNORMAL LOW (ref 135–145)
TOTAL PROTEIN: 6 g/dL — AB (ref 6.5–8.1)

## 2017-10-04 LAB — BASIC METABOLIC PANEL
BUN/Creatinine Ratio: 16 (ref 12–28)
BUN: 20 mg/dL (ref 8–27)
CALCIUM: 9.4 mg/dL (ref 8.7–10.3)
CHLORIDE: 84 mmol/L — AB (ref 96–106)
CO2: 26 mmol/L (ref 20–29)
Creatinine, Ser: 1.24 mg/dL — ABNORMAL HIGH (ref 0.57–1.00)
GFR calc non Af Amer: 39 mL/min/{1.73_m2} — ABNORMAL LOW (ref >59)
GFR, EST AFRICAN AMERICAN: 45 mL/min/{1.73_m2} — AB (ref >59)
Glucose: 132 mg/dL — ABNORMAL HIGH (ref 65–99)
Potassium: 5.3 mmol/L — ABNORMAL HIGH (ref 3.5–5.2)
Sodium: 123 mmol/L — ABNORMAL LOW (ref 134–144)

## 2017-10-04 LAB — T4, FREE: Free T4: 0.83 ng/dL (ref 0.82–1.77)

## 2017-10-04 LAB — MAGNESIUM
Magnesium: 2.2 mg/dL (ref 1.6–2.3)
Magnesium: 2.3 mg/dL (ref 1.7–2.4)

## 2017-10-04 LAB — PROTIME-INR
INR: 1.07
Prothrombin Time: 13.8 seconds (ref 11.4–15.2)

## 2017-10-04 LAB — APTT: APTT: 35 s (ref 24–36)

## 2017-10-04 LAB — BRAIN NATRIURETIC PEPTIDE: B Natriuretic Peptide: 116.9 pg/mL — ABNORMAL HIGH (ref 0.0–100.0)

## 2017-10-04 MED ORDER — SODIUM CHLORIDE 0.9% FLUSH: 3.0000 mL | INTRAVENOUS | Status: DC | PRN

## 2017-10-04 MED ORDER — LIOTHYRONINE SODIUM 25 MCG PO TABS: 25.0000 ug | ORAL_TABLET | Freq: Every day | ORAL | Status: DC

## 2017-10-04 MED ORDER — FUROSEMIDE 10 MG/ML IJ SOLN: 10.0000 mg | Freq: Every day | INTRAMUSCULAR | Status: DC

## 2017-10-04 MED ORDER — ONDANSETRON HCL 4 MG/2ML IJ SOLN: 4.0000 mg | Freq: Four times a day (QID) | INTRAMUSCULAR | Status: DC | PRN

## 2017-10-04 MED ORDER — SODIUM CHLORIDE 0.9 % IV SOLN
INTRAVENOUS | Status: DC
  Administered 2017-10-04: 16:00:00 via INTRAVENOUS

## 2017-10-04 MED ORDER — MOMETASONE FURO-FORMOTEROL FUM 200-5 MCG/ACT IN AERO
2.0000 | INHALATION_SPRAY | Freq: Two times a day (BID) | RESPIRATORY_TRACT | Status: DC
  Administered 2017-10-05 (×2): 2 via RESPIRATORY_TRACT
  Filled 2017-10-04: qty 8.8

## 2017-10-04 MED ORDER — LEVOTHYROXINE SODIUM 112 MCG PO TABS
112.0000 ug | ORAL_TABLET | Freq: Every day | ORAL | Status: DC
  Administered 2017-10-05 – 2017-10-06 (×2): 112 ug via ORAL
  Filled 2017-10-04 (×2): qty 1

## 2017-10-04 MED ORDER — DULOXETINE HCL 30 MG PO CPEP
60.0000 mg | ORAL_CAPSULE | Freq: Every day | ORAL | Status: DC
  Administered 2017-10-05 – 2017-10-06 (×2): 60 mg via ORAL
  Filled 2017-10-04 (×2): qty 2

## 2017-10-04 MED ORDER — ORAL CARE MOUTH RINSE
15.0000 mL | Freq: Two times a day (BID) | OROMUCOSAL | Status: DC
  Administered 2017-10-05 – 2017-10-06 (×3): 15 mL via OROMUCOSAL

## 2017-10-04 MED ORDER — SODIUM CHLORIDE 0.9% FLUSH
3.0000 mL | Freq: Two times a day (BID) | INTRAVENOUS | Status: DC
  Administered 2017-10-04 – 2017-10-06 (×4): 3 mL via INTRAVENOUS

## 2017-10-04 MED ORDER — ACETAMINOPHEN 325 MG PO TABS
650.0000 mg | ORAL_TABLET | ORAL | Status: DC | PRN
  Administered 2017-10-05: 650 mg via ORAL
  Filled 2017-10-04: qty 2

## 2017-10-04 MED ORDER — FUROSEMIDE 20 MG PO TABS: 10.0000 mg | ORAL_TABLET | Freq: Every day | ORAL | Status: DC

## 2017-10-04 MED ORDER — SODIUM CHLORIDE 0.9 % IV SOLN: 250.0000 mL | INTRAVENOUS | Status: DC | PRN

## 2017-10-04 MED ORDER — ASPIRIN EC 81 MG PO TBEC
81.0000 mg | DELAYED_RELEASE_TABLET | Freq: Every day | ORAL | Status: DC
  Administered 2017-10-05 – 2017-10-06 (×2): 81 mg via ORAL
  Filled 2017-10-04 (×2): qty 1

## 2017-10-04 MED ORDER — ENOXAPARIN SODIUM 30 MG/0.3ML ~~LOC~~ SOLN
30.0000 mg | SUBCUTANEOUS | Status: DC
  Administered 2017-10-04 – 2017-10-05 (×2): 30 mg via SUBCUTANEOUS
  Filled 2017-10-04 (×2): qty 0.3

## 2017-10-04 MED ORDER — CARVEDILOL 12.5 MG PO TABS
12.5000 mg | ORAL_TABLET | Freq: Two times a day (BID) | ORAL | Status: DC
  Administered 2017-10-04 – 2017-10-06 (×4): 12.5 mg via ORAL
  Filled 2017-10-04 (×4): qty 1

## 2017-10-04 MED ORDER — CALCIUM CARBONATE-VITAMIN D 500-200 MG-UNIT PO TABS
2.0000 | ORAL_TABLET | Freq: Every day | ORAL | Status: DC
  Administered 2017-10-05 – 2017-10-06 (×2): 2 via ORAL
  Filled 2017-10-04 (×2): qty 2

## 2017-10-04 NOTE — Plan of Care (Signed)
  Problem: Education: Goal: Knowledge of General Education information will improve Outcome: Progressing   Problem: Nutrition: Goal: Adequate nutrition will be maintained Outcome: Progressing   Problem: Coping: Goal: Level of anxiety will decrease Outcome: Progressing   Problem: Safety: Goal: Ability to remain free from injury will improve Outcome: Progressing   Problem: Skin Integrity: Goal: Risk for impaired skin integrity will decrease Outcome: Progressing

## 2017-10-04 NOTE — Progress Notes (Signed)
Critical digoxin level received. She is symptomatic. Thankfully, cardiac rhythm device has protected her from arrhythmia. K level mildly elevated. Hyponatremic. Admit for digibind. Talked to her daughter Arrie Aran and son Merry Proud. [name] will bring her to [facility] for direct admission to [facility] telemetry bed.  Sanda Klein, MD, [facility] [phone] office [phone] pager

## 2017-10-04 NOTE — Progress Notes (Signed)
Patient is a direct admit from home,  oriented to room, vitals stable, connected to tele monitor, RN paged cardiology Master via [place].

## 2017-10-04 NOTE — Plan of Care (Signed)
°  Problem: Education: °Goal: Ability to demonstrate management of disease process will improve °Outcome: Progressing °  °Problem: Education: °Goal: Ability to verbalize understanding of medication therapies will improve °Outcome: Progressing °  °Problem: Activity: °Goal: Capacity to carry out activities will improve °Outcome: Progressing °  °

## 2017-10-04 NOTE — H&P (Addendum)
Cardiology Admission History and Physical:   Patient ID: [name]; MRN: [id number]; DOB: [birth year]   Admission date: 10/04/2017  Primary Care Provider: Glenford Bayley, DO Primary Cardiologist: Sanda Klein, MD  Primary Electrophysiologist:  Dr. Caryl Comes  Chief Complaint:  Elevated digoxin level  Patient Profile:   [name] is a 84 y.o. female with a history of with PMH of chronic combined CHF, non-ischemic cardiomyopathy, s/p ICD, paroxysmal atrial flutter, COPD, and hypothyroidism, who was found to have an elevated digoxin level of 3.4 on labs yesterday and was instructed to present to the hospital for digoxin toxicity.   History of Present Illness:   [name] reports feeling poorly over the last month, worsening over the past several days. She reports increasing confusion and frequently loses her train of thought throughout conversation. She also notes increasing fatigue, finding herself falling asleep while sitting on the couch, and even on the toilet, resulting in a fall 2 days ago with +headstrike. She denies headache, neck pain, or visual disturbances as a result of fall. She states fall was not 2/2 syncope. She reports intermittent nausea over the past 1-2 weeks but denies emesis, abdominal pain, or diarrhea. She has an ICD and denies recent shock, palpitations, or feeling her heart racing. She denies visual halo effect.   She was seen by Dr. Sallyanne Kuster outpatient yesterday with the same complaints. She was recently instructed to increase her po lasix dose from 10mg  daily to 40mg  daily x3 days due to increased SOB and LE edema, although daughter reported she had been taking 60mg  daily x3 days. Dr. Loletha Grayer was concerned given longstanding digoxin use and obtained a digoxin level which resulted this morning at 3.4 ng/mL, prompting him to send the patient to [facility] for direct admission.  At the time of this evaluation, she is laying comfortably in bed sleeping. She awoke easily and reports  significant fatigue. She reports chronic 3 pillow orthopnea and LE edema. She is without CP or SOB at this time.   Vitals on presentation with mild hypertension, O2 sat of 87% on RA, improved to 96% with O2 via [place]; otherwise VSS. BMET yesterday with Na 123, K 5.3, Cr 1.24 (baseline 0.7), Mg 2.2, and Digoxin level 3.4  Past Medical History:  Diagnosis Date  . AICD (automatic cardioverter/defibrillator) present 06/27/2007   medtronic  . Cancer ([place])    ovarian--stage 4  . Cardiomyopathy ([place])   . CHF (congestive heart failure) ([place])   . Dyspnea   . Peripheral neuropathy   . Systemic hypertension   . Thyroid disease     Past Surgical History:  Procedure Laterality Date  . ABDOMINAL HYSTERECTOMY  oct 2000   along with ovarian ca surg  . APPENDECTOMY    . CARDIAC CATHETERIZATION  09/27/1990   normal coronaries,dilated cardiomyopathy  . CARDIAC DEFIBRILLATOR PLACEMENT  06/27/2007   medtronic concerto  . CHOLECYSTECTOMY    . IMPLANTABLE CARDIOVERTER DEFIBRILLATOR GENERATOR CHANGE N/A 07/31/2013   Procedure: IMPLANTABLE CARDIOVERTER DEFIBRILLATOR GENERATOR CHANGE;  Surgeon: Sanda Klein, MD;  Location: [facility] CATH [facility];  Service: Cardiovascular;  Laterality: N/A;  . NM MYOCAR PERF WALL MOTION  06/05/2007   no ischemia     Medications Prior to Admission: Prior to Admission medications   Medication Sig Start Date End Date Taking? Authorizing Provider  alendronate (FOSAMAX) 70 MG tablet Take 70 mg by mouth once a week. Sundays 09/01/15   [provider]  aspirin EC 81 MG tablet Take 1 tablet (81 mg  total) by mouth daily. 10/03/17   Croitoru, Mihai, MD  calcium-vitamin D (OSCAL WITH D) 250-125 MG-UNIT per tablet Take 2 tablets by mouth daily.     [provider]  carvedilol (COREG) 12.5 MG tablet Take 1 tablet (12.5 mg total) by mouth 2 (two) times daily with a meal. Please make appt with Dr. Caryl Comes for [place]. 1st attempt 09/27/17   Deboraha Sprang, MD  co-enzyme Q-10 50 MG capsule  Take 50 mg by mouth daily. Reported on 09/29/2015    [provider]  digoxin (DIGOX) 0.125 MG tablet TAKE 1 TABLET DAILY (DOSE DECREASED, PLEASE KEEP UPCOMING APPOINTMENT WITH DOCTOR KLEIN) 06/10/17   Croitoru, Mihai, MD  DULERA 200-5 MCG/ACT AERO Inhale 2 puffs into the lungs 2 (two) times daily. 09/22/15   [provider]  DULoxetine (CYMBALTA) 30 MG capsule Take 30 mg by mouth 2 (two) times daily.  08/25/15   [provider]  furosemide (LASIX) 20 MG tablet Take 0.5 tablets (10 mg total) by mouth daily. NEEDS APPOINTMENT WITH DR Sallyanne Kuster FOR FUTURE REFILLS 08/03/17   Croitoru, Dani Gobble, MD  liothyronine (CYTOMEL) 25 MCG tablet Take 25 mcg by mouth daily. 07/02/15   [provider]  losartan (COZAAR) 100 MG tablet TAKE 1 TABLET DAILY (SCHEDULE APPOINTMENT FOR REFILLS) 02/21/17   Croitoru, Mihai, MD  oxyCODONE (ROXICODONE) 15 MG immediate release tablet Take 15 mg by mouth every 6 (six) hours as needed for pain.  02/28/15   [provider]  SYNTHROID 112 MCG tablet Take 112 mcg by mouth daily before breakfast.  09/12/15   [provider]     Allergies:   No Known Allergies  Social History:   Social History   Socioeconomic History  . Marital status: Widowed    Spouse name: Not on file  . Number of children: Not on file  . Years of education: Not on file  . Highest education level: Not on file  Occupational History  . Occupation: housewife  Social Needs  . Financial resource strain: Not on file  . Food insecurity:    Worry: Not on file    Inability: Not on file  . Transportation needs:    Medical: Not on file    Non-medical: Not on file  Tobacco Use  . Smoking status: Never Smoker  . Smokeless tobacco: Never Used  Substance and Sexual Activity  . Alcohol use: No  . Drug use: No  . Sexual activity: Not on file  Lifestyle  . Physical activity:    Days per week: Not on file    Minutes per session: Not on file  . Stress: Not on file    Relationships  . Social connections:    Talks on phone: Not on file    Gets together: Not on file    Attends religious service: Not on file    Active member of club or organization: Not on file    Attends meetings of clubs or organizations: Not on file    Relationship status: Not on file  . Intimate partner violence:    Fear of current or ex partner: Not on file    Emotionally abused: Not on file    Physically abused: Not on file    Forced sexual activity: Not on file  Other Topics Concern  . Not on file  Social History Narrative  . Not on file    Family History:   The patient's family history includes Heart attack in her father; Heart disease  in her paternal grandmother; Lung cancer in her father and mother.    ROS:  Please see the history of present illness.  All other ROS reviewed and negative.     Physical Exam/Data:   Vitals:   10/04/17 1039 10/04/17 1040 10/04/17 1135  BP:  (!) 151/56 (!) 139/59  Pulse:  62 (!) 59  Resp:  18 20  Temp:  99 F (37.2 C) 98.3 F (36.8 C)  TempSrc:  Oral Oral  SpO2:  (!) 87% 96%  Weight: 139 lb 14.4 oz (63.5 kg)    Height: 5' (1.524 m)     No intake or output data in the 24 hours ending 10/04/17 1317 Filed Weights   10/04/17 1039  Weight: 139 lb 14.4 oz (63.5 kg)   Body mass index is 27.32 kg/m.  General:  Elderly female laying in bed in no acute distress HEENT: sclera anicteric Neck: no JVD Vascular: No carotid bruits; distal pulses 2+ bilaterally  Cardiac:  normal S1, S2; RRR; +murmur, no gallops or rubs Lungs:  clear to auscultation bilaterally, with scattered wheezing Abd: soft, nontender, no hepatomegaly  Ext: 1+ edema Musculoskeletal:  No deformities, BUE and BLE strength normal and equal Skin: warm and dry  Neuro:  CNs 2-12 intact, no focal abnormalities noted Psych:  Normal affect    EKG:  10/03/17 with paced rhythm  Relevant CV Studies: Echocardiogram 09/2015: Study Conclusions  - Left ventricle: The cavity  size was normal. There was moderate   concentric hypertrophy. Systolic function was vigorous. The   estimated ejection fraction was in the range of 65% to 70%. Wall   motion was normal; there were no regional wall motion   abnormalities. Abnormal relaxation with increased filling   pressures. - Aortic valve: There was mild regurgitation. - Mitral valve: Severely calcified annulus. Mildly thickened   leaflets . Valve area by continuity equation (using LVOT flow):   2.21 cm^2. - Left atrium: The atrium was severely dilated.  Laboratory Data:  Chemistry Recent Labs  Lab 10/03/17 1629  NA 123*  K 5.3*  CL 84*  CO2 26  GLUCOSE 132*  BUN 20  CREATININE 1.24*  CALCIUM 9.4  GFRNONAA 39*  GFRAA 45*    No results for input(s): PROT, ALBUMIN, AST, ALT, ALKPHOS, BILITOT in the last 168 hours. HematologyNo results for input(s): WBC, RBC, HGB, HCT, MCV, MCH, MCHC, RDW, PLT in the last 168 hours. Cardiac EnzymesNo results for input(s): TROPONINI in the last 168 hours. No results for input(s): TROPIPOC in the last 168 hours.  BNPNo results for input(s): BNP, PROBNP in the last 168 hours.  DDimer No results for input(s): DDIMER in the last 168 hours.  Radiology/Studies:  No results found.  Assessment and Plan:   1. Digoxin toxicity: Patient was seen outpatient 10/03/17 with increased fatigue and forgetfulness. Dr. Loletha Grayer concerned for digoxin toxicity and digoxin level was found to be elevated to 3.4; K 5.3.  She was sent for direct admission for possible digibind.  - Will repeat labs at this time. If K becomes more elevated, will consider digibind dosing (per discussion with pharmacy, treatment dose is 2 vials).  - Will start gentle IVF resuscitation  - Discontinue digoxin  2. Hyponatremia: Na 123 on last yesterday. Has had baseline mild hyponatremia dating back to 2017, although never with Na <128. She recently increased her home lasix from 10mg  daily to 60mg  daily x3 days. Possible some  element of dehydration - Will start gentle IVF resuscitation  3. Chronic combined CHF: EF improved to 65-70% on last echo 2017; G1DD with mild AR and mildly thickened mitral valve leaflets noted. She has some LE edema on exam, however despite this, concerned she may be a bit dry - Will repeat echocardiogram  - Will start gentle IVF resuscitation  - Will order compression stockings and restart home lasix 10mg  daily - Continue home carvedilol - Discontinue digoxin  4. AKI: Cr 1.24 on labs yesterday, baseline Cr 0.7. Was recently instructed to increase her lasix to 40mg  daily x3 days, however patient reportedly had been taking 60mg  daily x3 days.  - Will hold home losartan for now - Will start gentle IVF resuscitation - Will continue home lasix 10mg  daily  5. COPD: with some mild wheezing on exam. O2 sat 87% on presentation; improved to 96% with O2 via [place]. - Will check a CXR - Continue home dulera  6. Hypothyroidism: No recent TSH studies.  - Will check TSH/FT4 - Continue home levothyroxine - Pharmacy to clarify if patient also taking liothyronine as well  7. Paroxysmal atrial flutter: observed briefly 2 years ago on device interrogation.  - Continue to monitor ICD routinely outpatient    Severity of Illness: The appropriate patient status for this patient is OBSERVATION. Observation status is judged to be reasonable and necessary in order to provide the required intensity of service to ensure the patient's safety. The patient's presenting symptoms, physical exam findings, and initial radiographic and laboratory data in the context of their medical condition is felt to place them at decreased risk for further clinical deterioration. Furthermore, it is anticipated that the patient will be medically stable for discharge from the hospital within 2 midnights of admission. The following factors support the patient status of observation.   " The patient's presenting symptoms include fatigue,  confusion. " The physical exam findings include LE edema and mild wheezing on pulm exam. " The initial radiographic and laboratory data are Digoxin level 3.4; Na 123.     For questions or updates, please contact [place] Please consult [email or web address] for contact info under Cardiology/STEMI.    Signed, Abigail Butts, PA-C  10/04/2017 1:17 PM   Attending Note:   The patient was seen and examined.  Agree with assessment and plan as noted above.  Changes made to the above note as needed.  Patient seen and independently examined with  Roby Lofts, PA .   We discussed all aspects of the encounter. I agree with the assessment and plan as stated above.  1.   Digoxin toxicity:   Pt has had increasing leg swelling and shortness of breath.   Lasix was increased . She has developed mild renal insufficiency and subsequently has developed Digoxin toxicity.   Potassium is 5.3.   She is  not having any arrhythmias - has an ICD  Will give her some IVF. Hold Lasix for now.   DC digoxin.    Will give Digibind if her potassium level is higher / Dig level returns higher.   2.  Hyponatremia:   Likely due to additional Lasix with excess water.  May be responsible for some of her forgetfulness.   3.  Acute renal insufficiency:    Likely due to volume depletion. IVF, hold Lasix     I have spent a total of 40 minutes with patient reviewing hospital  notes , telemetry, EKGs, labs and examining patient as well as establishing an assessment and plan that was discussed with the patient. >  50% of time was spent in direct patient care.   Thayer Headings, Brooke Bonito., MD, [facility] 10/04/2017, 2:37 PM [address]  [place] Pager [phone]

## 2017-10-05 ENCOUNTER — Observation Stay (HOSPITAL_BASED_OUTPATIENT_CLINIC_OR_DEPARTMENT_OTHER): Payer: Medicare Other

## 2017-10-05 ENCOUNTER — Observation Stay (HOSPITAL_COMMUNITY): Payer: Medicare Other

## 2017-10-05 DIAGNOSIS — I420 Dilated cardiomyopathy: Secondary | ICD-10-CM | POA: Diagnosis not present

## 2017-10-05 DIAGNOSIS — I110 Hypertensive heart disease with heart failure: Secondary | ICD-10-CM | POA: Diagnosis not present

## 2017-10-05 DIAGNOSIS — I5042 Chronic combined systolic (congestive) and diastolic (congestive) heart failure: Secondary | ICD-10-CM | POA: Diagnosis not present

## 2017-10-05 DIAGNOSIS — E039 Hypothyroidism, unspecified: Secondary | ICD-10-CM | POA: Diagnosis not present

## 2017-10-05 DIAGNOSIS — I361 Nonrheumatic tricuspid (valve) insufficiency: Secondary | ICD-10-CM

## 2017-10-05 DIAGNOSIS — T460X1A Poisoning by cardiac-stimulant glycosides and drugs of similar action, accidental (unintentional), initial encounter: Secondary | ICD-10-CM | POA: Diagnosis not present

## 2017-10-05 DIAGNOSIS — T460X5A Adverse effect of cardiac-stimulant glycosides and drugs of similar action, initial encounter: Secondary | ICD-10-CM | POA: Diagnosis not present

## 2017-10-05 DIAGNOSIS — I5043 Acute on chronic combined systolic (congestive) and diastolic (congestive) heart failure: Secondary | ICD-10-CM | POA: Diagnosis not present

## 2017-10-05 DIAGNOSIS — J439 Emphysema, unspecified: Secondary | ICD-10-CM | POA: Diagnosis not present

## 2017-10-05 DIAGNOSIS — I083 Combined rheumatic disorders of mitral, aortic and tricuspid valves: Secondary | ICD-10-CM | POA: Diagnosis not present

## 2017-10-05 LAB — BASIC METABOLIC PANEL
ANION GAP: 9 (ref 5–15)
BUN: 19 mg/dL (ref 8–23)
CALCIUM: 8.5 mg/dL — AB (ref 8.9–10.3)
CO2: 30 mmol/L (ref 22–32)
Chloride: 92 mmol/L — ABNORMAL LOW (ref 98–111)
Creatinine, Ser: 1.14 mg/dL — ABNORMAL HIGH (ref 0.44–1.00)
GFR calc Af Amer: 49 mL/min — ABNORMAL LOW (ref >60)
GFR calc non Af Amer: 42 mL/min — ABNORMAL LOW (ref >60)
GLUCOSE: 109 mg/dL — AB (ref 70–99)
Potassium: 4.3 mmol/L (ref 3.5–5.1)
Sodium: 131 mmol/L — ABNORMAL LOW (ref 135–145)

## 2017-10-05 LAB — ECHOCARDIOGRAM COMPLETE
HEIGHTINCHES: 60 in
WEIGHTICAEL: 2236.8 [oz_av]

## 2017-10-05 MED ORDER — POLYETHYLENE GLYCOL 3350 17 G PO PACK
17.0000 g | PACK | Freq: Every day | ORAL | Status: DC | PRN
  Administered 2017-10-05: 17 g via ORAL
  Filled 2017-10-05: qty 1

## 2017-10-05 MED ORDER — OXYCODONE HCL 5 MG PO TABS
5.0000 mg | ORAL_TABLET | ORAL | Status: DC | PRN
  Administered 2017-10-05 – 2017-10-06 (×6): 5 mg via ORAL
  Filled 2017-10-05 (×6): qty 1

## 2017-10-05 MED ORDER — SENNOSIDES-DOCUSATE SODIUM 8.6-50 MG PO TABS
2.0000 | ORAL_TABLET | Freq: Every day | ORAL | Status: DC
  Administered 2017-10-05 – 2017-10-06 (×2): 2 via ORAL
  Filled 2017-10-05 (×2): qty 2

## 2017-10-05 NOTE — Care Management Obs Status (Signed)
[place] NOTIFICATION   Patient Details  Name: [name] MRN: [id number] Date of Birth: 23-Jul-1928   Medicare Observation Status Notification Given:  Yes    Royston Bake, RN 10/05/2017, 11:19 AM

## 2017-10-05 NOTE — Evaluation (Signed)
Physical Therapy Evaluation Patient Details Name: [name] MRN: [id number] DOB: 1928/08/27 Today's Date: 10/05/2017   History of Present Illness  Pt is an 84 y.o. female admitted 10/04/17 with digoxin toxicity. Pt also with RLE edema; doppler negative for acute DVT. PMH includes CHF, non-ischemic cardiomyopathy, s/p ICD, a-flutter, COPD.     Clinical Impression  Pt presents with an overall decrease in functional mobility secondary to above. PTA, pt reports mod indep with rollator; lives with daughter who provides supervision for ADLs. Today, pt ambulating with rollator and intermittent min guard for balance. Demonstrates decreased safety awareness and poor attention, requiring cues for safety with mobility. Pt also with decreased light touch sensation and bilateral foot drop increasing risk for falls. Pt would benefit from continued acute PT services to maximize functional mobility and independence prior to d/c with HHPT services.     Follow Up Recommendations Home health PT;Supervision for mobility/OOB    Equipment Recommendations  None recommended by PT    Recommendations for Other Services       Precautions / Restrictions Precautions Precautions: Fall Restrictions Weight Bearing Restrictions: No      Mobility  Bed Mobility Overal bed mobility: Needs Assistance Bed Mobility: Supine to Sit     Supine to sit: Supervision     General bed mobility comments: Increased time and effort; no physical assist required  Transfers Overall transfer level: Needs assistance Equipment used: 4-wheeled walker Transfers: Sit to/from Stand Sit to Stand: Min guard         General transfer comment: Cues to lock brakes and for safe hand positioning; repeated when standing and sitting  Ambulation/Gait Ambulation/Gait assistance: Min guard Gait Distance (Feet): 220 Feet Assistive device: 4-wheeled walker Gait Pattern/deviations: Step-through pattern;Decreased stride length;Decreased  dorsiflexion - right;Decreased dorsiflexion - left;Trunk flexed Gait velocity: Decreased Gait velocity interpretation: <1.8 ft/sec, indicate of risk for recurrent falls General Gait Details: Slow ambulation with rollator and intermittent min guard for balance. Very forward flexed posture requiring multiple cues to correct and maintain closer proximity to rollater. Bilat foot drop, but pt with good ability to compensate with increased hip flexion  Stairs            Wheelchair Mobility    Modified Rankin (Stroke Patients Only)       Balance Overall balance assessment: Needs assistance   Sitting balance-Leahy Scale: Good Sitting balance - Comments: Able to adjust bilat socks while sitting EOB     Standing balance-Leahy Scale: Poor Standing balance comment: Reliant on UE support                             Pertinent Vitals/Pain Pain Assessment: Faces Faces Pain Scale: Hurts a little bit Pain Location: "everywhere from my arthritis" Pain Intervention(s): Limited activity within patient's tolerance    Home Living Family/patient expects to be discharged to:: Private residence Living Arrangements: Children;Other relatives Available Help at Discharge: Family;Available PRN/intermittently Type of Home: House Home Access: Stairs to enter Entrance Stairs-Rails: Right Entrance Stairs-Number of Steps: 2 Home Layout: Two level;Able to live on main level with bedroom/bathroom Home Equipment: Gilford Rile - 4 wheels Additional Comments: Daughter assists at home, but works during day. Has baby monitors set up so daughter can listen out if pt needs her    Prior Function Level of Independence: Needs assistance   Gait / Transfers Assistance Needed: Mod indep with rollator for household ambulation; reports two falls from sitting position in the past  2 months. Daughter drives  ADL's / Homemaking Assistance Needed: Supervision for ADLs as needed, pt reports indep with bathing but  daughter stays close        Hand Dominance        Extremity/Trunk Assessment   Upper Extremity Assessment Upper Extremity Assessment: Overall WFL for tasks assessed    Lower Extremity Assessment Lower Extremity Assessment: RLE deficits/detail;LLE deficits/detail RLE Deficits / Details: Hip/knee WFL; ankle DF 2/5. Bilat foot neuropathy (did not feel light touch to distal foot/toes) RLE Sensation: decreased light touch;history of peripheral neuropathy LLE Deficits / Details: Hip/knee WFL; ankle DF 2/5. Bilat foot neuropathy (did not feel light touch to distal foot/toes) LLE Sensation: decreased light touch;history of peripheral neuropathy    Cervical / Trunk Assessment Cervical / Trunk Assessment: Kyphotic  Communication   Communication: No difficulties  Cognition Arousal/Alertness: Awake/alert Behavior During Therapy: WFL for tasks assessed/performed Overall Cognitive Status: Impaired/Different from baseline Area of Impairment: Attention;Memory;Following commands;Safety/judgement;Awareness;Problem solving                   Current Attention Level: Selective Memory: Decreased short-term memory Following Commands: Follows multi-step commands inconsistently Safety/Judgement: Decreased awareness of deficits;Decreased awareness of safety Awareness: Emergent Problem Solving: Requires verbal cues;Slow processing General Comments: Notes continued deficits with word finding. Tangential with speech, benefitted from short cues to stay on task      General Comments General comments (skin integrity, edema, etc.): SpO2 >90% on RA    Exercises     Assessment/Plan    PT Assessment Patient needs continued PT services  PT Problem List Decreased strength;Decreased activity tolerance;Decreased balance;Decreased mobility;Decreased cognition;Decreased knowledge of use of DME;Decreased safety awareness       PT Treatment Interventions DME instruction;Gait training;Stair  training;Functional mobility training;Therapeutic activities;Therapeutic exercise;Balance training;Patient/family education    PT Goals (Current goals can be found in the Care Plan section)  Acute Rehab PT Goals Patient Stated Goal: Return home PT Goal Formulation: With patient Time For Goal Achievement: 10/19/17 Potential to Achieve Goals: Good    Frequency Min 3X/week   Barriers to discharge        Co-evaluation               AM-PAC PT "6 Clicks" Daily Activity  Outcome Measure Difficulty turning over in bed (including adjusting bedclothes, sheets and blankets)?: None Difficulty moving from lying on back to sitting on the side of the bed? : None Difficulty sitting down on and standing up from a chair with arms (e.g., wheelchair, bedside commode, etc,.)?: A Little Help needed moving to and from a bed to chair (including a wheelchair)?: A Little Help needed walking in hospital room?: A Little Help needed climbing 3-5 steps with a railing? : A Little 6 Click Score: 20    End of Session Equipment Utilized During Treatment: Gait belt Activity Tolerance: Patient tolerated treatment well Patient left: in bed;with call bell/phone within reach;with bed alarm set Nurse Communication: Mobility status PT Visit Diagnosis: Other abnormalities of gait and mobility (R26.89);Repeated falls (R29.6)    Time: [id number] PT Time Calculation (min) (ACUTE ONLY): 28 min   Charges:   PT Evaluation $PT Eval Moderate Complexity: 1 Mod PT Treatments $Gait Training: 8-22 mins   PT G Codes:       Mabeline Caras, PT, DPT Acute Rehab Services  Pager: [place] 10/05/2017, 5:26 PM

## 2017-10-05 NOTE — Progress Notes (Signed)
  Echocardiogram 2D Echocardiogram has been performed.  [name] 10/05/2017, 1:49 PM

## 2017-10-05 NOTE — Care Management CC44 (Signed)
Condition Code 44 Documentation Completed  Patient Details  Name: [name] MRN: [id number] Date of Birth: Apr 27, 1928   Condition Code 44 given:  Yes Patient signature on Condition Code 44 notice:  (pt refused to sign) Documentation of 2 MD's agreement:  Yes Code 44 added to claim:  Yes    Royston Bake, RN 10/05/2017, 11:19 AM

## 2017-10-05 NOTE — Progress Notes (Addendum)
Progress Note  Patient Name: [name] Date of Encounter: 10/05/2017  Primary Cardiologist: Sanda Klein, MD   Subjective   Pt feels well today, no complaints. Eager to discharge home  Inpatient Medications    Scheduled Meds: . aspirin EC  81 mg Oral Daily  . calcium-vitamin D  2 tablet Oral Q breakfast  . carvedilol  12.5 mg Oral BID WC  . DULoxetine  60 mg Oral Daily  . enoxaparin (LOVENOX) injection  30 mg Subcutaneous Q24H  . levothyroxine  112 mcg Oral QAC breakfast  . mouth rinse  15 mL Mouth Rinse BID  . mometasone-formoterol  2 puff Inhalation BID  . senna-docusate  2 tablet Oral Daily  . sodium chloride flush  3 mL Intravenous Q12H   Continuous Infusions: . sodium chloride    . sodium chloride Stopped (10/05/17 0319)   PRN Meds: sodium chloride, acetaminophen, ondansetron (ZOFRAN) IV, oxyCODONE, polyethylene glycol, sodium chloride flush   Vital Signs    Vitals:   10/05/17 0219 10/05/17 0434 10/05/17 0748 10/05/17 0812  BP:  (!) 166/61 (!) 159/63   Pulse:  60 61   Resp:  18 17   Temp:  97.7 F (36.5 C) 97.9 F (36.6 C)   TempSrc:  Oral Oral   SpO2:  99% 99% 96%  Weight: 139 lb 12.8 oz (63.4 kg)     Height:        Intake/Output Summary (Last 24 hours) at 10/05/2017 0840 Last data filed at 10/05/2017 0319 Gross per 24 hour  Intake 1818.82 ml  Output 800 ml  Net 1018.82 ml   Filed Weights   10/04/17 1039 10/05/17 0219  Weight: 139 lb 14.4 oz (63.5 kg) 139 lb 12.8 oz (63.4 kg)    Telemetry    pacing - Personally Reviewed  ECG    No new tracings - Personally Reviewed  Physical Exam   GEN: No acute distress.   Neck: No JVD Cardiac: RRR, no murmurs, rubs, or gallops.  Respiratory: Clear to auscultation bilaterally in upper lobes, scattered crackles in bases GI: Soft, nontender, non-distended  MS: trace edema; No deformity. Neuro:  Nonfocal  Psych: Normal affect   Labs    Chemistry Recent Labs  Lab 10/03/17 1629  10/04/17 1344 10/05/17 0534  NA 123* 125* 131*  K 5.3* 4.6 4.3  CL 84* 85* 92*  CO2 26 30 30   GLUCOSE 132* 127* 109*  BUN 20 22 19   CREATININE 1.24* 1.72* 1.14*  CALCIUM 9.4 8.8* 8.5*  PROT  --  6.0*  --   ALBUMIN  --  3.4*  --   AST  --  138*  --   ALT  --  94*  --   ALKPHOS  --  124  --   BILITOT  --  0.8  --   GFRNONAA 39* 26* 42*  GFRAA 45* 30* 49*  ANIONGAP  --  10 9     Hematology Recent Labs  Lab 10/04/17 1344  WBC 8.2  RBC 3.87  HGB 11.9*  HCT 34.4*  MCV 88.9  MCH 30.7  MCHC 34.6  RDW 11.9  PLT 137*    Cardiac EnzymesNo results for input(s): TROPONINI in the last 168 hours. No results for input(s): TROPIPOC in the last 168 hours.   BNP Recent Labs  Lab 10/04/17 1344  BNP 116.9*     DDimer No results for input(s): DDIMER in the last 168 hours.   Radiology    Dg Chest 2  View  Result Date: 10/05/2017 CLINICAL DATA:  84 year old female with a history of shortness of breath EXAM: CHEST - 2 VIEW COMPARISON:  09/13/2017 FINDINGS: Cardiomediastinal silhouette unchanged in size and contour. Unchanged cardiac AICD/pacer. Calcification of the aortic arch. No pneumothorax. Stigmata of emphysema, with increased retrosternal airspace, flattened hemidiaphragms, increased AP diameter, and hyperinflation on the AP view. No pleural effusion or confluent airspace disease. Coarsened interstitial markings. Osteopenia. Degenerative changes of the spine. Unchanged configuration of lower thoracic/upper lumbar compression fractures. IMPRESSION: Chronic lung changes and emphysema without evidence of superimposed acute cardiopulmonary disease. Unchanged cardiac AICD Electronically Signed   By: Corrie Mckusick D.O.   On: 10/05/2017 07:34    Cardiac Studies   Echo pending  Patient Profile     84 y.o. female with a history of with PMH of chronic combined CHF, non-ischemic cardiomyopathy, s/p ICD, paroxysmal atrial flutter, COPD, and hypothyroidism, who was found to have an  elevated digoxin level of 3.4 on labs yesterday and was instructed to present to the hospital for digoxin toxicity.   Assessment & Plan    1. Digoxin toxicity - digoxin discontinued - digoxin level yesterday was 2.5 - digibind ws not given - K today 4.3 - gentle hydration with 1L NS   2. Hyponatremia, baseline Na near 128 - Na yesterday was 123, improved to 131 on NS - continue daily BMP   3. Chronic combined systolic and diastolic heart failure - echo pendgin - home lasix 10 mg has not been restarted yet - despite NS fluids overnight, she appears near euvolemic on exam   4. Paroxysmal atrial flutter - will continue to monitor on telemetry - pacing on telemetry - will follow conservatively as outpatient - will avoid digoxin in the future   5. AKI - sCr today improved 1.14, from 1.74 - hold off on restarting lasix for another day    For questions or updates, please contact [place] Please consult [email or web address] for contact info under Cardiology/STEMI.      Signed, Tami Lin Duke, PA  10/05/2017, 8:40 AM    Attending Note:   The patient was seen and examined.  Agree with assessment and plan as noted above.  Changes made to the above note as needed.  Patient seen and independently examined with  Doreene Adas, PA .   We discussed all aspects of the encounter. I agree with the assessment and plan as stated above.  1.   Digoxin toxicity: Dig level is better - 2.5 today  Potassium level is better  Renal function has improved with 1 liter of NS  No significant arrhythmias Will continue to follow   2.  Chronic diastolic CHF:   Echo to be done today  Seems stable     I have spent a total of 40 minutes with patient reviewing hospital  notes , telemetry, EKGs, labs and examining patient as well as establishing an assessment and plan that was discussed with the patient. > 50% of time was spent in direct patient care.    Thayer Headings, Brooke Bonito., MD,  [facility] 10/05/2017, 9:08 AM [address],  [place] Pager [phone]

## 2017-10-05 NOTE — Plan of Care (Signed)
  Problem: Education: Goal: Knowledge of General Education information will improve Outcome: Progressing   Problem: Health Behavior/Discharge Planning: Goal: Ability to manage health-related needs will improve Outcome: Progressing   Problem: Clinical Measurements: Goal: Ability to maintain clinical measurements within normal limits will improve Outcome: Progressing Goal: Will remain free from infection Outcome: Progressing Goal: Diagnostic test results will improve Outcome: Progressing Goal: Respiratory complications will improve Outcome: Progressing Goal: Cardiovascular complication will be avoided Outcome: Progressing   Problem: Activity: Goal: Risk for activity intolerance will decrease Outcome: Progressing   Problem: Nutrition: Goal: Adequate nutrition will be maintained Outcome: Progressing   Problem: Coping: Goal: Level of anxiety will decrease Outcome: Progressing   Problem: Elimination: Goal: Will not experience complications related to bowel motility Outcome: Progressing Goal: Will not experience complications related to urinary retention Outcome: Progressing   Problem: Pain Managment: Goal: General experience of comfort will improve Outcome: Progressing   Problem: Safety: Goal: Ability to remain free from injury will improve Outcome: Progressing   Problem: Skin Integrity: Goal: Risk for impaired skin integrity will decrease Outcome: Progressing   Problem: Education: Goal: Ability to demonstrate management of disease process will improve Outcome: Progressing Goal: Ability to verbalize understanding of medication therapies will improve Outcome: Progressing   Problem: Activity: Goal: Capacity to carry out activities will improve Outcome: Progressing   Problem: Cardiac: Goal: Ability to achieve and maintain adequate cardiopulmonary perfusion will improve Outcome: Progressing   

## 2017-10-06 DIAGNOSIS — E039 Hypothyroidism, unspecified: Secondary | ICD-10-CM | POA: Diagnosis not present

## 2017-10-06 DIAGNOSIS — T460X1A Poisoning by cardiac-stimulant glycosides and drugs of similar action, accidental (unintentional), initial encounter: Secondary | ICD-10-CM | POA: Diagnosis not present

## 2017-10-06 DIAGNOSIS — T460X5A Adverse effect of cardiac-stimulant glycosides and drugs of similar action, initial encounter: Secondary | ICD-10-CM | POA: Diagnosis not present

## 2017-10-06 DIAGNOSIS — I5042 Chronic combined systolic (congestive) and diastolic (congestive) heart failure: Secondary | ICD-10-CM | POA: Diagnosis not present

## 2017-10-06 DIAGNOSIS — I083 Combined rheumatic disorders of mitral, aortic and tricuspid valves: Secondary | ICD-10-CM | POA: Diagnosis not present

## 2017-10-06 DIAGNOSIS — I110 Hypertensive heart disease with heart failure: Secondary | ICD-10-CM | POA: Diagnosis not present

## 2017-10-06 DIAGNOSIS — I420 Dilated cardiomyopathy: Secondary | ICD-10-CM | POA: Diagnosis not present

## 2017-10-06 DIAGNOSIS — I5043 Acute on chronic combined systolic (congestive) and diastolic (congestive) heart failure: Secondary | ICD-10-CM | POA: Diagnosis not present

## 2017-10-06 LAB — BASIC METABOLIC PANEL
Anion gap: 9 (ref 5–15)
BUN: 14 mg/dL (ref 8–23)
CO2: 31 mmol/L (ref 22–32)
Calcium: 8.6 mg/dL — ABNORMAL LOW (ref 8.9–10.3)
Chloride: 91 mmol/L — ABNORMAL LOW (ref 98–111)
Creatinine, Ser: 0.87 mg/dL (ref 0.44–1.00)
GFR calc Af Amer: >60 mL/min (ref >60)
GFR, EST NON AFRICAN AMERICAN: 58 mL/min — AB (ref >60)
GLUCOSE: 98 mg/dL (ref 70–99)
POTASSIUM: 4.2 mmol/L (ref 3.5–5.1)
Sodium: 131 mmol/L — ABNORMAL LOW (ref 135–145)

## 2017-10-06 LAB — DIGOXIN LEVEL: Digoxin Level: 0.9 ng/mL (ref 0.8–2.0)

## 2017-10-06 MED ORDER — FUROSEMIDE 20 MG PO TABS: 10.0000 mg | ORAL_TABLET | Freq: Every day | ORAL | 0 refills | Status: DC

## 2017-10-06 NOTE — Plan of Care (Signed)
  Problem: Education: Goal: Knowledge of General Education information will improve Outcome: Adequate for Discharge   Problem: Health Behavior/Discharge Planning: Goal: Ability to manage health-related needs will improve Outcome: Adequate for Discharge   Problem: Clinical Measurements: Goal: Ability to maintain clinical measurements within normal limits will improve Outcome: Adequate for Discharge Goal: Will remain free from infection Outcome: Adequate for Discharge Goal: Diagnostic test results will improve Outcome: Adequate for Discharge Goal: Respiratory complications will improve Outcome: Adequate for Discharge Goal: Cardiovascular complication will be avoided Outcome: Adequate for Discharge

## 2017-10-06 NOTE — Progress Notes (Signed)
Pt discharge instructions reviewed with pt and her daughter. Pt and daughter verbalize understanding and state they have no questions. Pt belongings with pt. Pt is not in distress. Pt discharged via wheelchair. Pt's daughter is driving her home.

## 2017-10-06 NOTE — Discharge Summary (Addendum)
Discharge Summary    Patient ID: [name],  MRN: [id number], DOB/AGE: 82-Mar-1930 82 y.o.  Admit date: 10/04/2017 Discharge date: 10/06/2017  Primary Care Provider: Glenford Bayley Primary Cardiologist: Sanda Klein, MD , Dr. Caryl Comes  Discharge Diagnoses    Principal Problem:   Digoxin toxicity Active Problems:   COPD (chronic obstructive pulmonary disease) ([place])   Nonischemic cardiomyopathy ([place])   Biventricular ICD Medtronic Concerto implanted 6195   Chronic combined systolic and diastolic CHF, NYHA class 2 (H[facility])   Hyperlipidemia   Paroxysmal atrial flutter ([place])   Essential hypertension   Allergies No Known Allergies  Diagnostic Studies/Procedures    Echo 10/05/17: Study Conclusions - Left ventricle: The cavity size was normal. Wall thickness was   increased in a pattern of severe LVH. There was moderate   concentric hypertrophy. Systolic function was normal. The   estimated ejection fraction was in the range of 55% to 60%. Wall   motion was normal; there were no regional wall motion   abnormalities. Doppler parameters are consistent with abnormal   left ventricular relaxation (grade 1 diastolic dysfunction). - Aortic valve: Trileaflet; moderately thickened, moderately   calcified leaflets. There was mild stenosis. There was moderate   regurgitation. Valve area (Vmean): 2.08 cm^2. Regurgitation   pressure half-time: 375 ms. - Mitral valve: Calcified annulus. Moderately thickened, moderately   calcified leaflets . Thickening. Mobility was moderately   restricted. The findings are consistent with moderate stenosis.   Mean gradient (D): 6 mm Hg. Valve area by pressure half-time:   1.51 cm^2. Valve area by continuity equation (using LVOT flow):   2.84 cm^2. - Left atrium: The atrium was severely dilated. - Right atrium: The atrium was moderately to severely dilated. - Tricuspid valve: There was moderate regurgitation. Peak RV-RA   gradient (S): 37 mm Hg. -  Pulmonary arteries: PA peak pressure: 45 mm Hg (S).  Impressions: - Normal LV EF with diastolic dysfunction. Calcified aortic and   mitral valves with stenosis/regurgitation as above. Elevate right   sided filling pressures.  History of Present Illness     [name] is a 84 y.o. female with a history of with PMH of chronic combined CHF, non-ischemic cardiomyopathy, s/p ICD, paroxysmal atrial flutter, COPD, and hypothyroidism, who was found to have an elevated digoxin level of 3.4 on labs yesterday and was instructed to present to the hospital for digoxin toxicity.   Ms. [name] reports feeling poorly over the last month, worsening over the past several days. She reports increasing confusion and frequently loses her train of thought throughout conversation. She also notes increasing fatigue, finding herself falling asleep while sitting on the couch, and even on the toilet, resulting in a fall 2 days ago with +headstrike. She denies headache, neck pain, or visual disturbances as a result of fall. She states fall was not 2/2 syncope. She reports intermittent nausea over the past 1-2 weeks but denies emesis, abdominal pain, or diarrhea. She has an ICD and denies recent shock, palpitations, or feeling her heart racing. She denies visual halo effect.   She was seen by Dr. Sallyanne Kuster outpatient yesterday with the same complaints. She was recently instructed to increase her po lasix dose from 10mg  daily to 40mg  daily x3 days due to increased SOB and LE edema, although daughter reported she had been taking 60mg  daily x3 days. Dr. Loletha Grayer was concerned given longstanding digoxin use and obtained a digoxin level which resulted this morning at 3.4 ng/mL, prompting  him to send the patient to [facility] for direct admission.  At the time of this evaluation, she is laying comfortably in bed sleeping. She awoke easily and reports significant fatigue. She reports chronic 3 pillow orthopnea and LE edema. She is without CP or SOB  at this time.   Vitals on presentation with mild hypertension, O2 sat of 87% on RA, improved to 96% with O2 via [place]; otherwise VSS. BMET yesterday with Na 123, K 5.3, Cr 1.24 (baseline 0.7), Mg 2.2, and Digoxin level 3.4.  Hospital Course     Consultants: none  Digoxin toxicity Pt was admitted to cardiology for management of digoxin toxicity. Digoxin was discontinued. She was gently hydrated with 1L NS. Digoxin (2.5) and K (4.2) improved. Final digoxin level pending prior to discharge.   Paroxysmal atrial flutter She has maintained sinus rhythm while hospitalized. HR today  Will have her follow up with EP for rhythm management.  Continue corge 12.5 mg BID. No anticoagulation - atrial flutter was 48 seconds in duration and occurred 2 years ago.   Hyponatremia Na at discharged had improved to 131 from 123.  Chronic combined systolic and diastolic heart failure, nonischemic cardiomyopathy (clean coronaries by cath 1992) Echo yesterday with normal EF and grade 1 DD. Pt is euvolemic on exam. EF was improved with CRT-D.  Acute kidney injury Admission creatinine was elevated to 1.72, improved to 0.87 at discharge.   _____________  Discharge Vitals Blood pressure (!) 152/64, pulse 60, temperature 98.4 F (36.9 C), temperature source Oral, resp. rate 20, height 5' (1.524 m), weight 139 lb 4.8 oz (63.2 kg), SpO2 94 %.  Filed Weights   10/04/17 1039 10/05/17 0219 10/06/17 0447  Weight: 139 lb 14.4 oz (63.5 kg) 139 lb 12.8 oz (63.4 kg) 139 lb 4.8 oz (63.2 kg)    Labs & Radiologic Studies    CBC Recent Labs    10/04/17 1344  WBC 8.2  NEUTROABS 6.1  HGB 11.9*  HCT 34.4*  MCV 88.9  PLT 623*   Basic Metabolic Panel Recent Labs    10/03/17 1629  10/04/17 1344 10/05/17 0534 10/06/17 0611  NA 123*   < > 125* 131* 131*  K 5.3*  --  4.6 4.3 4.2  CL 84*  --  85* 92* 91*  CO2 26  --  30 30 31   GLUCOSE 132*   < > 127* 109* 98  BUN 20   < > 22 19 14   CREATININE 1.24*  --  1.72*  1.14* 0.87  CALCIUM 9.4  --  8.8* 8.5* 8.6*  MG 2.2  --  2.3  --   --    < > = values in this interval not displayed.   Liver Function Tests Recent Labs    10/04/17 1344  AST 138*  ALT 94*  ALKPHOS 124  BILITOT 0.8  PROT 6.0*  ALBUMIN 3.4*   No results for input(s): LIPASE, AMYLASE in the last 72 hours. Cardiac Enzymes No results for input(s): CKTOTAL, CKMB, CKMBINDEX, TROPONINI in the last 72 hours. BNP Invalid input(s): POCBNP D-Dimer No results for input(s): DDIMER in the last 72 hours. Hemoglobin A1C No results for input(s): HGBA1C in the last 72 hours. Fasting Lipid Panel No results for input(s): CHOL, HDL, LDLCALC, TRIG, CHOLHDL, LDLDIRECT in the last 72 hours. Thyroid Function Tests Recent Labs    10/04/17 1344  TSH 2.569   _____________  Dg Chest 2 View  Result Date: 10/05/2017 CLINICAL DATA:  84 year old female with a  history of shortness of breath EXAM: CHEST - 2 VIEW COMPARISON:  09/13/2017 FINDINGS: Cardiomediastinal silhouette unchanged in size and contour. Unchanged cardiac AICD/pacer. Calcification of the aortic arch. No pneumothorax. Stigmata of emphysema, with increased retrosternal airspace, flattened hemidiaphragms, increased AP diameter, and hyperinflation on the AP view. No pleural effusion or confluent airspace disease. Coarsened interstitial markings. Osteopenia. Degenerative changes of the spine. Unchanged configuration of lower thoracic/upper lumbar compression fractures. IMPRESSION: Chronic lung changes and emphysema without evidence of superimposed acute cardiopulmonary disease. Unchanged cardiac AICD Electronically Signed   By: Corrie Mckusick D.O.   On: 10/05/2017 07:34   US Venous Img Lower Unilateral Right  Result Date: 09/13/2017 CLINICAL DATA:  Right lower extremity swelling for 1 day. EXAM: RIGHT LOWER EXTREMITY VENOUS DOPPLER ULTRASOUND TECHNIQUE: Gray-scale sonography with graded compression, as well as color Doppler and duplex ultrasound were  performed to evaluate the lower extremity deep venous systems from the level of the common femoral vein and including the common femoral, femoral, profunda femoral, popliteal and calf veins including the posterior tibial, peroneal and gastrocnemius veins when visible. The superficial great saphenous vein was also interrogated. Spectral Doppler was utilized to evaluate flow at rest and with distal augmentation maneuvers in the common femoral, femoral and popliteal veins. COMPARISON:  None. FINDINGS: Contralateral Common Femoral Vein: Respiratory phasicity is normal and symmetric with the symptomatic side. No evidence of thrombus. Normal compressibility. Common Femoral Vein: No evidence of thrombus. Normal compressibility, respiratory phasicity and response to augmentation. Saphenofemoral Junction: No evidence of thrombus. Normal compressibility and flow on color Doppler imaging. Profunda Femoral Vein: No evidence of thrombus. Normal compressibility and flow on color Doppler imaging. Femoral Vein: No evidence of thrombus. Normal compressibility, respiratory phasicity and response to augmentation. Popliteal Vein: No evidence of thrombus. Normal compressibility, respiratory phasicity and response to augmentation. Calf Veins: No evidence of thrombus in the posterior tibial vein. Nonvisualization of the peroneal vein. Superficial Great Saphenous Vein: No evidence of thrombus. Normal compressibility. Venous Reflux:  None. Other Findings:  None. IMPRESSION: No evidence of deep venous thrombosis. These results will be called to the ordering clinician or representative by the Radiology Department at the imaging location. Electronically Signed   By: Logan Bores M.D.   On: 09/13/2017 18:23   Disposition   Pt is being discharged home today in good condition.  Follow-up Plans & Appointments    Follow-up Information    Almyra Deforest, [place] Follow up on 10/27/2017.   Specialties:  Cardiology, Radiology Why:  2:00 pm for hospial  follow up Contact information: [address] [place] [address] [phone]        Deboraha Sprang, MD Follow up in 2 week(s).   Specialty:  Cardiology Why:  office will call with appt Contact information: [address] [place] [address] [phone]          Discharge Instructions    Diet - low sodium heart healthy   Complete by:  As directed    Increase activity slowly   Complete by:  As directed       Discharge Medications   Allergies as of 10/06/2017   No Known Allergies     Medication List    TAKE these medications   alendronate 70 MG tablet Commonly known as:  FOSAMAX Take 70 mg by mouth every Sunday.   aspirin EC 81 MG tablet Take 1 tablet (81 mg total) by mouth daily.   Calcium Carbonate-Vitamin D 600-400 MG-UNIT chew  tablet Chew 2 tablets by mouth daily.   carvedilol 12.5 MG tablet Commonly known as:  COREG Take 1 tablet (12.5 mg total) by mouth 2 (two) times daily with a meal. Please make appt with Dr. Caryl Comes for Septem[place]. 1st attempt What changed:  additional instructions   co-enzyme Q-10 50 MG capsule Take 50 mg by mouth daily. Reported on 09/29/2015   DULERA 200-5 MCG/ACT Aero Generic drug:  mometasone-formoterol Inhale 2 puffs into the lungs daily as needed for wheezing or shortness of breath.   DULoxetine 60 MG capsule Commonly known as:  CYMBALTA Take 60 mg by mouth daily.   furosemide 20 MG tablet Commonly known as:  LASIX Take 0.5 tablets (10 mg total) by mouth daily. NEEDS APPOINTMENT WITH DR Sallyanne Kuster FOR FUTURE REFILLS   gabapentin 100 MG capsule Commonly known as:  NEURONTIN Take 100-300 mg by mouth at bedtime as needed (pain).   liothyronine 25 MCG tablet Commonly known as:  CYTOMEL Take 25 mcg by mouth daily.   losartan 100 MG tablet Commonly known as:  COZAAR TAKE 1 TABLET DAILY (SCHEDULE APPOINTMENT FOR REFILLS) What changed:  See the new instructions.   Melatonin 10 MG  Tabs Take 10 mg by mouth at bedtime as needed (sleep).   oxyCODONE 15 MG immediate release tablet Commonly known as:  ROXICODONE Take 15 mg by mouth every 6 (six) hours as needed for pain.   SYNTHROID 112 MCG tablet Generic drug:  levothyroxine Take 112 mcg by mouth daily before breakfast.         Outstanding Labs/Studies   BMP  Need further lasix?  Duration of Discharge Encounter   Greater than 30 minutes including physician time.  Signed, Tami Lin Duke, PA 10/06/2017, 9:23 AM   Attending Note:   Patient seen and independently examined with  Doreene Adas, PA .   We discussed all aspects of the encounter. I agree with the assessment and plan as stated above.  1.    Dig toxicity:   Greatly improved. No symptom of Dig toxicity. Renal function has improved.  Will DC digoxin  2.   Acute renal insufficiency: The patient presented with volume depletion was found to have renal insufficiency.  She received IV fluids.  We have reduced her Lasix back down to her home dose.  She will follow-up with [name] in the office.  3.  Chronic diastolic congestive heart failure: Patient has known chronic diastolic congestive heart failure.  He presented actually volume depleted.  Continue her home dose of Lasix.  This will be adjusted and monitored as an outpatient.  4.  Arthritis: The patient is on oxycodone 15 mg every 6 hours as needed at home.  We will resume that.   She is stable for DC     Ramond Dial., MD, [facility] 10/09/2017, 2:45 PM [address],  [place] Pager [phone]

## 2017-10-06 NOTE — Progress Notes (Signed)
Physical Therapy Treatment Patient Details Name: [name] MRN: [id number] DOB: Mar 08, 1929 Today's Date: 10/06/2017    History of Present Illness Pt is an 84 y.o. female admitted 10/04/17 with digoxin toxicity. Pt also with RLE edema; doppler negative for acute DVT. PMH includes CHF, non-ischemic cardiomyopathy, s/p ICD, a-flutter, COPD.    PT Comments    Pt preparing to d/c home with daughter today. Today's session focused on performing ADLs and transfers/ambulation with RW in preparation to leave. Pt requires single UE support to maintain support with assist for LB dressing. Amb in room with RW and min guard for balance; poor carryover for cues/educ on safe ambulation technique from yesterday's session. If remains admitted, will follow acutely. Continue to recommend HHPT services.   Follow Up Recommendations  Home health PT;Supervision for mobility/OOB     Equipment Recommendations  None recommended by PT    Recommendations for Other Services       Precautions / Restrictions Precautions Precautions: Fall Restrictions Weight Bearing Restrictions: No    Mobility  Bed Mobility Overal bed mobility: Needs Assistance Bed Mobility: Supine to Sit     Supine to sit: Supervision     General bed mobility comments: Increased time and effort; no physical assist required  Transfers Overall transfer level: Needs assistance Equipment used: Rolling walker (2 wheeled) Transfers: Sit to/from Stand Sit to Stand: Min guard         General transfer comment: Stood with no DME, requiring UE support to maintain balance and assist for lower body dressing while standing. Stood 2x more from bed and toilet with RW and min guard for balance  Ambulation/Gait Ambulation/Gait assistance: Counsellor (Feet): 30 Feet Assistive device: Rolling walker (2 wheeled) Gait Pattern/deviations: Step-through pattern;Decreased stride length;Decreased dorsiflexion - right;Decreased  dorsiflexion - left;Trunk flexed Gait velocity: Decreased Gait velocity interpretation: <1.8 ft/sec, indicate of risk for recurrent falls General Gait Details: Amb throughout room with RW and min guard for balance. Significantly forward flexed posture requiring cues to correct; cues for safe proximity to RW, but pt with poor carryover   Stairs             Wheelchair Mobility    Modified Rankin (Stroke Patients Only)       Balance Overall balance assessment: Needs assistance   Sitting balance-Leahy Scale: Good       Standing balance-Leahy Scale: Poor Standing balance comment: Reliant on UE support                            Cognition Arousal/Alertness: Awake/alert Behavior During Therapy: WFL for tasks assessed/performed Overall Cognitive Status: Impaired/Different from baseline Area of Impairment: Attention;Memory;Following commands;Safety/judgement;Awareness;Problem solving                   Current Attention Level: Selective Memory: Decreased short-term memory Following Commands: Follows multi-step commands inconsistently;Follows one step commands with increased time Safety/Judgement: Decreased awareness of deficits;Decreased awareness of safety Awareness: Emergent Problem Solving: Requires verbal cues;Slow processing        Exercises      General Comments        Pertinent Vitals/Pain Pain Assessment: No/denies pain    Home Living                      Prior Function            PT Goals (current goals can now be found in the care plan section)  Acute Rehab PT Goals Patient Stated Goal: Return home PT Goal Formulation: With patient Time For Goal Achievement: 10/19/17 Potential to Achieve Goals: Good Progress towards PT goals: Progressing toward goals    Frequency    Min 3X/week      PT Plan Current plan remains appropriate    Co-evaluation              AM-PAC PT "6 Clicks" Daily Activity  Outcome  Measure  Difficulty turning over in bed (including adjusting bedclothes, sheets and blankets)?: None Difficulty moving from lying on back to sitting on the side of the bed? : None Difficulty sitting down on and standing up from a chair with arms (e.g., wheelchair, bedside commode, etc,.)?: A Little Help needed moving to and from a bed to chair (including a wheelchair)?: A Little Help needed walking in hospital room?: A Little Help needed climbing 3-5 steps with a railing? : A Little 6 Click Score: 20    End of Session Equipment Utilized During Treatment: Gait belt Activity Tolerance: Patient tolerated treatment well Patient left: in bed;with call bell/phone within reach;with bed alarm set Nurse Communication: Mobility status PT Visit Diagnosis: Other abnormalities of gait and mobility (R26.89);Repeated falls (R29.6)     Time: 0258-5277 PT Time Calculation (min) (ACUTE ONLY): 15 min  Charges:  $Therapeutic Activity: 8-22 mins                    G Codes:      Mabeline Caras, PT, DPT Acute Rehab Services  Pager: [place] 10/06/2017, 11:23 AM

## 2017-10-06 NOTE — Progress Notes (Addendum)
Progress Note  Patient Name: [name] Date of Encounter: 10/06/2017  Primary Cardiologist: Sanda Klein, MD   Subjective   Pt feels well and is ready for discharge.  Inpatient Medications    Scheduled Meds: . aspirin EC  81 mg Oral Daily  . calcium-vitamin D  2 tablet Oral Q breakfast  . carvedilol  12.5 mg Oral BID WC  . DULoxetine  60 mg Oral Daily  . enoxaparin (LOVENOX) injection  30 mg Subcutaneous Q24H  . levothyroxine  112 mcg Oral QAC breakfast  . mouth rinse  15 mL Mouth Rinse BID  . mometasone-formoterol  2 puff Inhalation BID  . senna-docusate  2 tablet Oral Daily  . sodium chloride flush  3 mL Intravenous Q12H   Continuous Infusions: . sodium chloride    . sodium chloride Stopped (10/05/17 0319)   PRN Meds: sodium chloride, acetaminophen, ondansetron (ZOFRAN) IV, oxyCODONE, polyethylene glycol, sodium chloride flush   Vital Signs    Vitals:   10/05/17 1926 10/05/17 1948 10/06/17 0447 10/06/17 0836  BP:  (!) 173/53 (!) 195/64 (!) 152/64  Pulse:  (!) 59 (!) 59 60  Resp:  18 20   Temp:  98 F (36.7 C) 98.4 F (36.9 C)   TempSrc:  Oral Oral   SpO2: 97% 91% 94%   Weight:   139 lb 4.8 oz (63.2 kg)   Height:        Intake/Output Summary (Last 24 hours) at 10/06/2017 0909 Last data filed at 10/06/2017 0901 Gross per 24 hour  Intake 720 ml  Output 800 ml  Net -80 ml   Filed Weights   10/04/17 1039 10/05/17 0219 10/06/17 0447  Weight: 139 lb 14.4 oz (63.5 kg) 139 lb 12.8 oz (63.4 kg) 139 lb 4.8 oz (63.2 kg)    Telemetry    pacing - Personally Reviewed  ECG    No new tracings - Personally Reviewed  Physical Exam   GEN: No acute distress.   Neck: No JVD Cardiac: RRR, no murmurs, rubs, or gallops.  Respiratory: Clear to auscultation bilaterally. GI: Soft, nontender, non-distended  MS: No edema; No deformity. Neuro:  Nonfocal  Psych: Normal affect   Labs    Chemistry Recent Labs  Lab 10/04/17 1344 10/05/17 0534 10/06/17 0611    NA 125* 131* 131*  K 4.6 4.3 4.2  CL 85* 92* 91*  CO2 30 30 31   GLUCOSE 127* 109* 98  BUN 22 19 14   CREATININE 1.72* 1.14* 0.87  CALCIUM 8.8* 8.5* 8.6*  PROT 6.0*  --   --   ALBUMIN 3.4*  --   --   AST 138*  --   --   ALT 94*  --   --   ALKPHOS 124  --   --   BILITOT 0.8  --   --   GFRNONAA 26* 42* 58*  GFRAA 30* 49* >60  ANIONGAP 10 9 9      Hematology Recent Labs  Lab 10/04/17 1344  WBC 8.2  RBC 3.87  HGB 11.9*  HCT 34.4*  MCV 88.9  MCH 30.7  MCHC 34.6  RDW 11.9  PLT 137*    Cardiac EnzymesNo results for input(s): TROPONINI in the last 168 hours. No results for input(s): TROPIPOC in the last 168 hours.   BNP Recent Labs  Lab 10/04/17 1344  BNP 116.9*     DDimer No results for input(s): DDIMER in the last 168 hours.   Radiology    Dg Chest  2 View  Result Date: 10/05/2017 CLINICAL DATA:  84 year old female with a history of shortness of breath EXAM: CHEST - 2 VIEW COMPARISON:  09/13/2017 FINDINGS: Cardiomediastinal silhouette unchanged in size and contour. Unchanged cardiac AICD/pacer. Calcification of the aortic arch. No pneumothorax. Stigmata of emphysema, with increased retrosternal airspace, flattened hemidiaphragms, increased AP diameter, and hyperinflation on the AP view. No pleural effusion or confluent airspace disease. Coarsened interstitial markings. Osteopenia. Degenerative changes of the spine. Unchanged configuration of lower thoracic/upper lumbar compression fractures. IMPRESSION: Chronic lung changes and emphysema without evidence of superimposed acute cardiopulmonary disease. Unchanged cardiac AICD Electronically Signed   By: Corrie Mckusick D.O.   On: 10/05/2017 07:34    Cardiac Studies   Echo 10/05/17: Study Conclusions - Left ventricle: The cavity size was normal. Wall thickness was increased in a pattern of severe LVH. There was moderate concentric hypertrophy. Systolic function was normal. The estimated ejection fraction was in the  range of 55% to 60%. Wall motion was normal; there were no regional wall motion abnormalities. Doppler parameters are consistent with abnormal left ventricular relaxation (grade 1 diastolic dysfunction). - Aortic valve: Trileaflet; moderately thickened, moderately calcified leaflets. There was mild stenosis. There was moderate regurgitation. Valve area (Vmean): 2.08 cm^2. Regurgitation pressure half-time: 375 ms. - Mitral valve: Calcified annulus. Moderately thickened, moderately calcified leaflets . Thickening. Mobility was moderately restricted. The findings are consistent with moderate stenosis. Mean gradient (D): 6 mm Hg. Valve area by pressure half-time: 1.51 cm^2. Valve area by continuity equation (using LVOT flow): 2.84 cm^2. - Left atrium: The atrium was severely dilated. - Right atrium: The atrium was moderately to severely dilated. - Tricuspid valve: There was moderate regurgitation. Peak RV-RA gradient (S): 37 mm Hg. - Pulmonary arteries: PA peak pressure: 45 mm Hg (S).  Impressions: - Normal LV EF with diastolic dysfunction. Calcified aortic and mitral valves with stenosis/regurgitation as above. Elevate right sided filling pressures.  Patient Profile     84 y.o. female  with a history of with PMH of chronic combined CHF, non-ischemic cardiomyopathy, s/p ICD, paroxysmal atrial flutter, COPD, and hypothyroidism, who was found to have an elevated digoxin level of 3.4 on labs yesterday and was instructed to present to the hospital for digoxin toxicity.  Assessment & Plan    1. Digoxin toxicity - digoxin level improving to 2.5, pending level today - K and Na have normalized - will continue to hold digoxin   2. Chronic combined systolic and diastolic heart failure - EF improved after CRT therapy - echo yesterday with normal EF and grad 1 DD - pt is euvolemic on exam   3. AKI - creatinine improved to 0.87, K 4.2   4. Paroxysmal atrial  flutter - pt with appropriate pacing on telemetry   For questions or updates, please contact [place] Please consult [email or web address] for contact info under Cardiology/STEMI.      Signed, Tami Lin Duke, PA  10/06/2017, 9:09 AM     Attending Note:   The patient was seen and examined.  Agree with assessment and plan as noted above.  Changes made to the above note as needed.  Patient seen and independently examined with  Doreene Adas, PA .   We discussed all aspects of the encounter. I agree with the assessment and plan as stated above.  1.    Dig toxicity:   Greatly improved. No symptom of Dig toxicity. Renal function has improved.  Will DC digoxin  2.   Acute renal insufficiency: The  patient presented with volume depletion was found to have renal insufficiency.  She received IV fluids.  We have reduced her Lasix back down to her home dose.  She will follow-up with [name] in the office.  3.  Chronic diastolic congestive heart failure: Patient has known chronic diastolic congestive heart failure.  He presented actually volume depleted.  Continue her home dose of Lasix.  This will be adjusted and monitored as an outpatient.  4.  Arthritis: The patient is on oxycodone 15 mg every 6 hours as needed at home.  We will resume that.   She is stable for DC     I have spent a total of 40 minutes with patient reviewing hospital  notes , telemetry, EKGs, labs and examining patient as well as establishing an assessment and plan that was discussed with the patient. > 50% of time was spent in direct patient care.    Thayer Headings, Brooke Bonito., MD, [facility] 10/06/2017, 9:19 AM [address]  [place] Pager [phone]

## 2017-10-06 NOTE — Plan of Care (Signed)
  Problem: Coping: Goal: Level of anxiety will decrease Outcome: Progressing   Problem: Pain Managment: Goal: General experience of comfort will improve Outcome: Progressing   Problem: Safety: Goal: Ability to remain free from injury will improve Outcome: Progressing   Problem: Skin Integrity: Goal: Risk for impaired skin integrity will decrease Outcome: Progressing   Problem: Education: Goal: Ability to verbalize understanding of medication therapies will improve Outcome: Progressing   Problem: Activity: Goal: Capacity to carry out activities will improve Outcome: Progressing

## 2017-10-13 DIAGNOSIS — Z09 Encounter for follow-up examination after completed treatment for conditions other than malignant neoplasm: Secondary | ICD-10-CM | POA: Diagnosis not present

## 2017-10-13 DIAGNOSIS — N179 Acute kidney failure, unspecified: Secondary | ICD-10-CM | POA: Diagnosis not present

## 2017-10-13 DIAGNOSIS — J45909 Unspecified asthma, uncomplicated: Secondary | ICD-10-CM | POA: Diagnosis not present

## 2017-10-13 DIAGNOSIS — I10 Essential (primary) hypertension: Secondary | ICD-10-CM | POA: Diagnosis not present

## 2017-10-13 DIAGNOSIS — R296 Repeated falls: Secondary | ICD-10-CM | POA: Diagnosis not present

## 2017-10-13 DIAGNOSIS — T460X1D Poisoning by cardiac-stimulant glycosides and drugs of similar action, accidental (unintentional), subsequent encounter: Secondary | ICD-10-CM | POA: Diagnosis not present

## 2017-10-13 DIAGNOSIS — I5042 Chronic combined systolic (congestive) and diastolic (congestive) heart failure: Secondary | ICD-10-CM | POA: Diagnosis not present

## 2017-10-17 ENCOUNTER — Ambulatory Visit (HOSPITAL_BASED_OUTPATIENT_CLINIC_OR_DEPARTMENT_OTHER)
Admission: RE | Admit: 2017-10-17 | Discharge: 2017-10-17 | Disposition: A | Discharge status: Home / Self Care | Payer: Medicare Other | Source: Ambulatory Visit | Attending: Cardiovascular Disease | Admitting: Cardiovascular Disease

## 2017-10-17 DIAGNOSIS — I509 Heart failure, unspecified: Secondary | ICD-10-CM | POA: Insufficient documentation

## 2017-10-17 DIAGNOSIS — I080 Rheumatic disorders of both mitral and aortic valves: Secondary | ICD-10-CM | POA: Insufficient documentation

## 2017-10-17 DIAGNOSIS — R0600 Dyspnea, unspecified: Secondary | ICD-10-CM | POA: Diagnosis present

## 2017-10-17 DIAGNOSIS — R0602 Shortness of breath: Secondary | ICD-10-CM | POA: Insufficient documentation

## 2017-10-27 ENCOUNTER — Ambulatory Visit: Payer: Medicare Other | Admitting: Physician Assistant

## 2017-10-31 ENCOUNTER — Encounter: Payer: Self-pay | Admitting: Gastroenterology

## 2017-10-31 ENCOUNTER — Ambulatory Visit (INDEPENDENT_AMBULATORY_CARE_PROVIDER_SITE_OTHER): Payer: Medicare Other | Admitting: Gastroenterology

## 2017-10-31 VITALS — BP 144/72 | HR 73 | Ht 61.0 in | Wt 143.0 lb

## 2017-10-31 DIAGNOSIS — G43A0 Cyclical vomiting, not intractable: Secondary | ICD-10-CM | POA: Diagnosis not present

## 2017-10-31 DIAGNOSIS — K56609 Unspecified intestinal obstruction, unspecified as to partial versus complete obstruction: Secondary | ICD-10-CM | POA: Diagnosis not present

## 2017-10-31 NOTE — Patient Instructions (Signed)
If you are age 82 or older, your body mass index should be between 23-30. Your Body mass index is 27.02 kg/m. If this is out of the aforementioned range listed, please consider follow up with your Primary Care Provider.  If you are age 22 or younger, your body mass index should be between 19-25. Your Body mass index is 27.02 kg/m. If this is out of the aformentioned range listed, please consider follow up with your Primary Care Provider.   Follow up as needed.  It was a pleasure to see you today!  Dr. Loletha Carrow

## 2017-10-31 NOTE — Progress Notes (Signed)
[facility] Consult Note:  History: [name] 10/31/2017  Referring physician: Glenford Bayley, DO  Reason for consult/chief complaint: Emesis (pt reports intermittent episodes of "vicious vomiting"; when the episodes are over patient feels fine; occasionally accompanied by diarrhea but not often)   Subjective  HPI:  This is an 84 year old woman accompanied by her daughter referred for episodic vomiting.  She reports a few years of episodes that occur on average about twice a year where she will have acute onset severe protracted vomiting that will last anywhere from 1 to 12 hours.  It will then resolve and she feels fine until the next episode.  She does not have chronic nausea or vomiting in between these episodes or any change in bowel habits. Chart review notes hospitalizations for bowel obstruction in December 2016 and again February 2017 for small bowel obstruction proven on CT scan.  Surgical consultations recommended conservative management, which was successful and she avoided surgery on both occasions.  When I brought this to their attention, the then recall these hospital stays but did not recall that they had been obstructions.  She says the episode she has had since then have behaved just the same, but she has not sought medical care for them.  I reviewed the entire office note from her cardiologist,, Dr. Sallyanne Kuster early last month.  Patient was having more dyspnea and fatigue and there was concern that her weight had increased.  Her volume status was somewhat uncertain but there was apparently concern for  volume overload back in June, but then perhaps volume overload during that visit.Marland Kitchen  Her  furosemide dose were decreased, and her digoxin was discontinued.  The digoxin level was elevated and she was admitted to the hospital for this.  Echocardiogram showed LVEF 50% with normal pulmonary artery pressure, mild dilation left atrium, grade 1 diastolic dysfunction.   Creatinine was elevated at 1.87, improved to normal at the time of discharge.  ROS:  Review of Systems  Constitutional: Negative for appetite change and unexpected weight change.  HENT: Negative for mouth sores and voice change.   Eyes: Negative for pain and redness.  Respiratory: Negative for cough and shortness of breath.   Cardiovascular: Negative for chest pain and palpitations.  Genitourinary: Negative for dysuria and hematuria.  Musculoskeletal: Positive for arthralgias and back pain. Negative for myalgias.  Skin: Negative for pallor and rash.  Neurological: Negative for weakness and headaches.  Hematological: Negative for adenopathy.  Psychiatric/Behavioral: Positive for dysphoric mood.     Past Medical History: Past Medical History:  Diagnosis Date  . AICD (automatic cardioverter/defibrillator) present 06/27/2007   medtronic  . ARF (acute renal failure) ([place]) 09/2017  . Cancer ([place])    ovarian--stage 4  . Cardiomyopathy ([place])   . CHF (congestive heart failure) ([place])   . Dyspnea   . Peripheral neuropathy   . Systemic hypertension   . Thyroid disease      Past Surgical History: Past Surgical History:  Procedure Laterality Date  . ABDOMINAL HYSTERECTOMY  oct 2000   along with ovarian ca surg  . APPENDECTOMY    . CARDIAC CATHETERIZATION  09/27/1990   normal coronaries,dilated cardiomyopathy  . CARDIAC DEFIBRILLATOR PLACEMENT  06/27/2007   medtronic concerto  . CHOLECYSTECTOMY    . IMPLANTABLE CARDIOVERTER DEFIBRILLATOR GENERATOR CHANGE N/A 07/31/2013   Procedure: IMPLANTABLE CARDIOVERTER DEFIBRILLATOR GENERATOR CHANGE;  Surgeon: Sanda Klein, MD;  Location: [facility];  Service: Cardiovascular;  Laterality: N/A;  . NM MYOCAR PERF  WALL MOTION  06/05/2007   no ischemia     Family History: Family History  Problem Relation Age of Onset  . Lung cancer Mother        died from cancer  . Heart attack Father   . Lung cancer Father   . Heart disease Paternal  Grandmother     Social History: Social History   Socioeconomic History  . Marital status: Widowed    Spouse name: Not on file  . Number of children: Not on file  . Years of education: Not on file  . Highest education level: Not on file  Occupational History  . Occupation: housewife  Social Needs  . Financial resource strain: Not on file  . Food insecurity:    Worry: Not on file    Inability: Not on file  . Transportation needs:    Medical: Not on file    Non-medical: Not on file  Tobacco Use  . Smoking status: Never Smoker  . Smokeless tobacco: Never Used  Substance and Sexual Activity  . Alcohol use: No  . Drug use: No  . Sexual activity: Not on file  Lifestyle  . Physical activity:    Days per week: Not on file    Minutes per session: Not on file  . Stress: Not on file  Relationships  . Social connections:    Talks on phone: Not on file    Gets together: Not on file    Attends religious service: Not on file    Active member of club or organization: Not on file    Attends meetings of clubs or organizations: Not on file    Relationship status: Not on file  Other Topics Concern  . Not on file  Social History Narrative  . Not on file    Allergies: No Known Allergies  Outpatient Meds: Current Outpatient Medications  Medication Sig Dispense Refill  . alendronate (FOSAMAX) 70 MG tablet Take 70 mg by mouth every Sunday.     Marland Kitchen aspirin EC 81 MG tablet Take 1 tablet (81 mg total) by mouth daily. 90 tablet 3  . Calcium Carbonate-Vitamin D 600-400 MG-UNIT chew tablet Chew 2 tablets by mouth daily.     . carvedilol (COREG) 12.5 MG tablet Take 1 tablet (12.5 mg total) by mouth 2 (two) times daily with a meal. Please make appt with Dr. Caryl Comes for Septembe. 1st attempt (Patient taking differently: Take 12.5 mg by mouth 2 (two) times daily with a meal. ) 180 tablet 0  . co-enzyme Q-10 50 MG capsule Take 50 mg by mouth daily. Reported on 09/29/2015    . DULERA 200-5 MCG/ACT  AERO Inhale 2 puffs into the lungs daily as needed for wheezing or shortness of breath.   3  . DULoxetine (CYMBALTA) 60 MG capsule Take 60 mg by mouth daily.   2  . furosemide (LASIX) 20 MG tablet Take 0.5 tablets (10 mg total) by mouth daily. NEEDS APPOINTMENT WITH DR Sallyanne Kuster FOR FUTURE REFILLS 15 tablet 0  . gabapentin (NEURONTIN) 100 MG capsule Take 100-300 mg by mouth at bedtime as needed (pain).    Marland Kitchen liothyronine (CYTOMEL) 25 MCG tablet Take 25 mcg by mouth daily.    Marland Kitchen losartan (COZAAR) 100 MG tablet TAKE 1 TABLET DAILY (SCHEDULE APPOINTMENT FOR REFILLS) (Patient taking differently: TAKE 1 TABLET DAILY at bedtime) 90 tablet 0  . Melatonin 10 MG TABS Take 10 mg by mouth at bedtime as needed (sleep).    Marland Kitchen oxyCODONE (ROXICODONE)  15 MG immediate release tablet Take 15 mg by mouth every 6 (six) hours as needed for pain.   0  . SYNTHROID 112 MCG tablet Take 112 mcg by mouth daily before breakfast.      No current facility-administered medications for this visit.       ___________________________________________________________________ Objective   Exam:  BP (!) 144/72   Pulse 73   Ht 5\' 1"  (1.549 m)   Wt 143 lb (64.9 kg)   BMI 27.02 kg/m    General: this is a(n) chronically ill-appearing elderly woman, alert and conversational and appropriate.  Antalgic gait, gets on exam table slowly and with assistance.  Eyes: sclera anicteric, no redness  ENT: oral mucosa moist without lesions, no cervical or supraclavicular lymphadenopathy, good dentition  CV: RRR without murmur, S1/S2, no JVD, + peripheral edema  Resp: clear to auscultation bilaterally, normal RR and effort noted  GI: soft, no tenderness, with active bowel sounds. No guarding or palpable organomegaly noted.  Long midline scar with a left-sided abdominal wall hernia  Skin; warm and dry, no rash or jaundice noted  Neuro: awake, alert and oriented x 3. Normal gross motor function and fluent speech  Labs:  CBC Latest Ref  Rng & Units 10/04/2017 10/01/2015 09/30/2015  WBC 4.0 - 10.5 K/uL 8.2 7.4 6.5  Hemoglobin 12.0 - 15.0 g/dL 11.9(L) 10.3(L) 11.6(L)  Hematocrit 36.0 - 46.0 % 34.4(L) 31.3(L) 34.9(L)  Platelets 150 - 400 K/uL 137(L) 155 145(L)   CMP Latest Ref Rng & Units 10/06/2017 10/05/2017 10/04/2017  Glucose 70 - 99 mg/dL 98 109(H) 127(H)  BUN 8 - 23 mg/dL 14 19 22   Creatinine 0.44 - 1.00 mg/dL 0.87 1.14(H) 1.72(H)  Sodium 135 - 145 mmol/L 131(L) 131(L) 125(L)  Potassium 3.5 - 5.1 mmol/L 4.2 4.3 4.6  Chloride 98 - 111 mmol/L 91(L) 92(L) 85(L)  CO2 22 - 32 mmol/L 31 30 30   Calcium 8.9 - 10.3 mg/dL 8.6(L) 8.5(L) 8.8(L)  Total Protein 6.5 - 8.1 g/dL - - 6.0(L)  Total Bilirubin 0.3 - 1.2 mg/dL - - 0.8  Alkaline Phos 38 - 126 U/L - - 124  AST 15 - 41 U/L - - 138(H)  ALT 0 - 44 U/L - - 94(H)     Radiologic Studies:  CLINICAL DATA:  84 year old female with abdominal pain, nausea vomiting.   EXAM: CT ABDOMEN AND PELVIS WITH CONTRAST   TECHNIQUE: Multidetector CT imaging of the abdomen and pelvis was performed using the standard protocol following bolus administration of intravenous contrast.   CONTRAST:  25mL OMNIPAQUE IOHEXOL 300 MG/ML SOLN, 128mL OMNIPAQUE IOHEXOL 300 MG/ML SOLN   COMPARISON:  CT dated 03/28/2015   FINDINGS: The visualized lung bases are clear. Mild cardiomegaly. Cardiac pacemaker wires noted. No intra-abdominal free air. Small free fluid within the pelvis.   Cholecystectomy. Mild biliary ductal dilatation, likely post cholecystectomy. The liver appears unremarkable. The pancreas is atrophic. The spleen appears unremarkable. There is left adrenal thickening/ hyperplasia. There is an indeterminate stable 1.5 cm right adrenal nodule. There is moderate left renal atrophy. There is no hydronephrosis on the left. There is an extrarenal pelvis on the left with mild fullness of the renal pelvis similar to prior study. The visualized ureters and urinary bladder appear  unremarkable. Hysterectomy.   There is postsurgical changes of bowel resection with multiple anastomotic sutures. Multiple mildly dilated loops of proximal and mid small bowel noted. The distal small bowel demonstrate a normal caliber. A transition zone is noted in the  left anterior lower abdomen (series 2 image 37) where there is abutment of bowel to the anterior peritoneal wall and adhesion. There is mild thickening of the bowel at this site. There is a broad-based ventral hernia along the midline vertical anterior pelvic wall incisional scar. There is abutment of loops of small bowel to the anterior peritoneal wall compatible with adhesions. A left periumbilical hernia defect containing a short segment of small bowel noted. There is no evidence of bowel obstruction at this site.   There is aortoiliac atherosclerotic disease. The abdominal aorta is tortuous and ectatic. No portal venous gas identified. There is no adenopathy.   There is osteopenia with scoliosis and extensive degenerative changes of the spine. 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 No acute fracture.   IMPRESSION: Small-bowel obstruction with transition zone in the left anterior lower abdomen secondary to peritoneal adhesions.   Other nonacute findings as seen on the prior exam and described above.     Electronically Signed   By: Anner Crete M.D.   On: 05/17/2015 02:02   Assessment: Encounter Diagnoses  Name Primary?  . Cyclical vomiting with nausea, intractability of vomiting not specified Yes  . Small bowel obstruction (HCC)     These episodes sound like more instances of partial small bowel obstruction.  They asked if anything can be done to prevent them, but it does not appear so.  She asked if any medication would help this, but I do not think so.  I strongly advised him to seek emergency department care when these episodes occur because of her age, chronic diuretic therapy, and  risk for volume depletion/electrolyte derangement from even a brief period of vomiting.  Plan:  No further testing planned See me as needed  Thank you for the courtesy of this consult.  Please call me with any questions or concerns.  Nelida Meuse III  CC: Le, Thao P, DO

## 2017-11-07 ENCOUNTER — Ambulatory Visit (INDEPENDENT_AMBULATORY_CARE_PROVIDER_SITE_OTHER): Payer: Medicare Other | Admitting: *Deleted

## 2017-11-07 DIAGNOSIS — I428 Other cardiomyopathies: Secondary | ICD-10-CM | POA: Diagnosis not present

## 2017-11-07 NOTE — Progress Notes (Signed)
Remote ICD transmission.   

## 2017-11-08 ENCOUNTER — Encounter: Payer: Self-pay | Admitting: Cardiology

## 2017-11-16 DIAGNOSIS — E039 Hypothyroidism, unspecified: Secondary | ICD-10-CM | POA: Diagnosis not present

## 2017-11-16 DIAGNOSIS — G620 Drug-induced polyneuropathy: Secondary | ICD-10-CM | POA: Diagnosis not present

## 2017-11-16 DIAGNOSIS — I5042 Chronic combined systolic (congestive) and diastolic (congestive) heart failure: Secondary | ICD-10-CM | POA: Diagnosis not present

## 2017-11-16 DIAGNOSIS — G894 Chronic pain syndrome: Secondary | ICD-10-CM | POA: Diagnosis not present

## 2017-11-16 DIAGNOSIS — M159 Polyosteoarthritis, unspecified: Secondary | ICD-10-CM | POA: Diagnosis not present

## 2017-11-16 DIAGNOSIS — I428 Other cardiomyopathies: Secondary | ICD-10-CM | POA: Diagnosis not present

## 2017-11-16 DIAGNOSIS — Z0000 Encounter for general adult medical examination without abnormal findings: Secondary | ICD-10-CM | POA: Diagnosis not present

## 2017-11-16 DIAGNOSIS — M810 Age-related osteoporosis without current pathological fracture: Secondary | ICD-10-CM | POA: Diagnosis not present

## 2017-11-16 DIAGNOSIS — I10 Essential (primary) hypertension: Secondary | ICD-10-CM | POA: Diagnosis not present

## 2017-11-17 LAB — CUP PACEART INCLINIC DEVICE CHECK
Battery Remaining Longevity: 19 mo
Battery Voltage: 2.9 V
Brady Statistic AP VP Percent: 8.45 %
Brady Statistic AS VP Percent: 90.07 %
Brady Statistic RV Percent Paced: 98.28 %
HighPow Impedance: 49 Ohm
HighPow Impedance: 64 Ohm
Implantable Lead Implant Date: 20090331
Implantable Lead Implant Date: 20090401
Implantable Lead Location: 753859
Implantable Lead Model: 4194
Implantable Lead Model: 5076
Implantable Lead Model: 6947
Lead Channel Impedance Value: 399 Ohm
Lead Channel Impedance Value: 513 Ohm
Lead Channel Impedance Value: 532 Ohm
Lead Channel Impedance Value: 646 Ohm
Lead Channel Pacing Threshold Amplitude: 0.625 V
Lead Channel Pacing Threshold Amplitude: 1.25 V
Lead Channel Pacing Threshold Amplitude: 1.5 V
Lead Channel Pacing Threshold Pulse Width: 0.4 ms
Lead Channel Pacing Threshold Pulse Width: 0.4 ms
Lead Channel Sensing Intrinsic Amplitude: 22.5 mV
Lead Channel Sensing Intrinsic Amplitude: 3 mV
Lead Channel Setting Pacing Amplitude: 2.25 V
Lead Channel Setting Pacing Pulse Width: 0.4 ms
Lead Channel Setting Sensing Sensitivity: 0.3 mV
MDC IDC LEAD IMPLANT DT: 20090331
MDC IDC LEAD LOCATION: 753858
MDC IDC LEAD LOCATION: 753860
MDC IDC MSMT LEADCHNL LV IMPEDANCE VALUE: 361 Ohm
MDC IDC MSMT LEADCHNL RA PACING THRESHOLD PULSEWIDTH: 0.4 ms
MDC IDC MSMT LEADCHNL RA SENSING INTR AMPL: 3 mV
MDC IDC MSMT LEADCHNL RV IMPEDANCE VALUE: 513 Ohm
MDC IDC MSMT LEADCHNL RV SENSING INTR AMPL: 22.5 mV
MDC IDC PG IMPLANT DT: 20150505
MDC IDC SESS DTM: 20190708200848
MDC IDC SET LEADCHNL RA PACING AMPLITUDE: 2 V
MDC IDC SET LEADCHNL RV PACING AMPLITUDE: 3.25 V
MDC IDC SET LEADCHNL RV PACING PULSEWIDTH: 0.4 ms
MDC IDC STAT BRADY AP VS PERCENT: 0.16 %
MDC IDC STAT BRADY AS VS PERCENT: 1.32 %
MDC IDC STAT BRADY RA PERCENT PACED: 8.61 %

## 2017-11-22 ENCOUNTER — Encounter: Payer: Self-pay | Admitting: Physician Assistant

## 2017-11-22 ENCOUNTER — Ambulatory Visit (INDEPENDENT_AMBULATORY_CARE_PROVIDER_SITE_OTHER): Payer: Medicare Other | Admitting: Physician Assistant

## 2017-11-22 VITALS — BP 182/84 | HR 63 | Ht 61.0 in | Wt 139.0 lb

## 2017-11-22 DIAGNOSIS — I10 Essential (primary) hypertension: Secondary | ICD-10-CM

## 2017-11-22 DIAGNOSIS — N179 Acute kidney failure, unspecified: Secondary | ICD-10-CM

## 2017-11-22 DIAGNOSIS — E785 Hyperlipidemia, unspecified: Secondary | ICD-10-CM

## 2017-11-22 DIAGNOSIS — I5032 Chronic diastolic (congestive) heart failure: Secondary | ICD-10-CM

## 2017-11-22 DIAGNOSIS — E039 Hypothyroidism, unspecified: Secondary | ICD-10-CM | POA: Diagnosis not present

## 2017-11-22 DIAGNOSIS — Z95810 Presence of automatic (implantable) cardiac defibrillator: Secondary | ICD-10-CM

## 2017-11-22 MED ORDER — FUROSEMIDE 20 MG PO TABS: 20.0000 mg | ORAL_TABLET | Freq: Every day | ORAL | 6 refills | Status: DC

## 2017-11-22 NOTE — Patient Instructions (Addendum)
Medication Instructions:  INCREASE Lasix 20mg  Take 1 WHOLE tablet once a day If you need a refill on your cardiac medications before your next appointment, please call your pharmacy.  Labwork: Your physician recommends that you return for lab work in: TODAY BMET  Your physician recommends that you return for lab work in: NEXT Tuesday 11/29/2017 [place] in our office  Testing/Procedures: NONE   Follow-Up: Your physician recommends that you schedule a follow-up appointment in: 2-3 [place].  Any Other Special Instructions Will Be Listed Below (If Applicable).

## 2017-11-22 NOTE — Progress Notes (Signed)
Cardiology Office Note    Date:  11/22/2017   ID:  [name], DOB 1928/12/16, MRN [id number]  PCP:  Glenford Bayley, DO  Cardiologist: Dr. Recardo Evangelist. Caryl Comes  Chief Complaint  Patient presents with  . Follow-up    seen for Dr. Sallyanne Kuster    History of Present Illness:  Katie RUMBERGER is a 84 y.o. female with PMH of COPD with chronic dyspnea, peripheral neuropathy secondary to history of chemotherapy, hypertension, hyperlipidemia, hypothyroidism, history of ovarian cancer, nonischemic cardiomyopathy s/p CRT-D.  Patient had a long-standing history of nonischemic cardiomyopathy.  Cardiac catheterization in July 1992 showed normal coronaries, dilated cardiomyopathy.  Myoview in March 2009 showed no ischemia.  Her ejection fraction has been as low as 20% in the past.  After implementation of CRT therapy in 2009, her ejection fraction has returned to normal.  Last echocardiogram in June 2017 showed EF 65 to 70%.  She underwent generator change out in May 2015 with Medtronic CRT-D.  She had a short episode of atrial flutter that measured only 48 seconds in duration and occurred several years ago, this has not recurred.  Therefore patient was not started on systemic anticoagulation.  More recently, she had evidence of volume overload, her Lasix was increased from 10 mg daily to 40 mg daily for 3 days, although daughter reports that she was taking 60 mg daily for 3 days.  Most recent device interrogation also showed increased atrial pacing, due to concern of atrial suppression by digoxin, digoxin level was obtained to rule out toxicity.  Digoxin level eventually came back at 3.4 prompting Dr. Sallyanne Kuster to send the patient to the hospital on 10/04/2017.  Initial creatinine was 1.24, her baseline was 0.7.  Potassium 5.3.  Digoxin was discontinued.  Renal function improved with gentle fluid hydration.  Echocardiogram obtained on 10/05/2017 showed EF 55 to 60%, grade 1 DD, moderate aortic stenosis, mild AS, moderate  mitral stenosis, peak PA pressure 45 mmHg.  Her discharge weight was 139 pounds.  Since discharge, she had a repeat echocardiogram on 10/18/2017 which showed EF 55 to 60%, grade 1 DD, mild aortic regurgitation.   Patient presents today for cardiology office visit.  She continued to have significant orthopnea and would get short of breath if she tries to lay flat.  She denies any significant paroxysmal nocturnal dyspnea.  She does not have any chest pain.  On physical exam, continued to have slightly more swollen right lower extremity than the left lower extremity.  Otherwise her weight is same as her recent discharge weight.  However given her orthopnea, I wish to do a trial on the 20 mg daily of Lasix.  I will obtain a basic metabolic panel today and also in 1 week.  I did not adjust her blood pressure medication as her blood pressure is usually in the 140s.  It is unclear to me why her blood pressure is elevated today.   Past Medical History:  Diagnosis Date  . AICD (automatic cardioverter/defibrillator) present 06/27/2007   medtronic  . ARF (acute renal failure) ([place]) 09/2017  . Cancer ([place])    ovarian--stage 4  . Cardiomyopathy ([place])   . CHF (congestive heart failure) ([place])   . Dyspnea   . Peripheral neuropathy   . Systemic hypertension   . Thyroid disease     Past Surgical History:  Procedure Laterality Date  . ABDOMINAL HYSTERECTOMY  oct 2000   along with ovarian ca surg  . APPENDECTOMY    .  CARDIAC CATHETERIZATION  09/27/1990   normal coronaries,dilated cardiomyopathy  . CARDIAC DEFIBRILLATOR PLACEMENT  06/27/2007   medtronic concerto  . CHOLECYSTECTOMY    . IMPLANTABLE CARDIOVERTER DEFIBRILLATOR GENERATOR CHANGE N/A 07/31/2013   Procedure: IMPLANTABLE CARDIOVERTER DEFIBRILLATOR GENERATOR CHANGE;  Surgeon: Sanda Klein, MD;  Location: [facility] CATH [facility];  Service: Cardiovascular;  Laterality: N/A;  . NM MYOCAR PERF WALL MOTION  06/05/2007   no ischemia    Current Medications: Outpatient  Medications Prior to Visit  Medication Sig Dispense Refill  . alendronate (FOSAMAX) 70 MG tablet Take 70 mg by mouth every Sunday.     Marland Kitchen aspirin EC 81 MG tablet Take 1 tablet (81 mg total) by mouth daily. 90 tablet 3  . Calcium Carbonate-Vitamin D 600-400 MG-UNIT chew tablet Chew 2 tablets by mouth daily.     . carvedilol (COREG) 12.5 MG tablet Take 1 tablet (12.5 mg total) by mouth 2 (two) times daily with a meal. Please make appt with Dr. Caryl Comes for Septembe. 1st attempt (Patient taking differently: Take 12.5 mg by mouth 2 (two) times daily with a meal. ) 180 tablet 0  . co-enzyme Q-10 50 MG capsule Take 50 mg by mouth daily. Reported on 09/29/2015    . DULERA 200-5 MCG/ACT AERO Inhale 2 puffs into the lungs daily as needed for wheezing or shortness of breath.   3  . DULoxetine (CYMBALTA) 60 MG capsule Take 60 mg by mouth daily.   2  . gabapentin (NEURONTIN) 100 MG capsule Take 100-300 mg by mouth at bedtime as needed (pain).    Marland Kitchen liothyronine (CYTOMEL) 25 MCG tablet Take 25 mcg by mouth daily.    Marland Kitchen losartan (COZAAR) 100 MG tablet TAKE 1 TABLET DAILY (SCHEDULE APPOINTMENT FOR REFILLS) (Patient taking differently: TAKE 1 TABLET DAILY at bedtime) 90 tablet 0  . Melatonin 10 MG TABS Take 10 mg by mouth at bedtime as needed (sleep).    Marland Kitchen oxyCODONE (ROXICODONE) 15 MG immediate release tablet Take 15 mg by mouth every 6 (six) hours as needed for pain.   0  . SYNTHROID 112 MCG tablet Take 112 mcg by mouth daily before breakfast.     . furosemide (LASIX) 20 MG tablet Take 0.5 tablets (10 mg total) by mouth daily. NEEDS APPOINTMENT WITH DR Sallyanne Kuster FOR FUTURE REFILLS 15 tablet 0   No facility-administered medications prior to visit.      Allergies:   Patient has no known allergies.   Social History   Socioeconomic History  . Marital status: Widowed    Spouse name: Not on file  . Number of children: Not on file  . Years of education: Not on file  . Highest education level: Not on file    Occupational History  . Occupation: housewife  Social Needs  . Financial resource strain: Not on file  . Food insecurity:    Worry: Not on file    Inability: Not on file  . Transportation needs:    Medical: Not on file    Non-medical: Not on file  Tobacco Use  . Smoking status: Never Smoker  . Smokeless tobacco: Never Used  Substance and Sexual Activity  . Alcohol use: No  . Drug use: No  . Sexual activity: Not on file  Lifestyle  . Physical activity:    Days per week: Not on file    Minutes per session: Not on file  . Stress: Not on file  Relationships  . Social connections:    Talks on phone: Not  on file    Gets together: Not on file    Attends religious service: Not on file    Active member of club or organization: Not on file    Attends meetings of clubs or organizations: Not on file    Relationship status: Not on file  Other Topics Concern  . Not on file  Social History Narrative  . Not on file     Family History:  The patient's family history includes Heart attack in her father; Heart disease in her paternal grandmother; Lung cancer in her father and mother.   ROS:   Please see the history of present illness.    ROS All other systems reviewed and are negative.   PHYSICAL EXAM:   VS:  BP (!) 182/84   Pulse 63   Ht 5\' 1"  (1.549 m)   Wt 139 lb (63 kg)   BMI 26.26 kg/m    GEN: Well nourished, well developed, in no acute distress  HEENT: normal  Neck: no JVD, carotid bruits, or masses Cardiac: RRR; no murmurs, rubs, or gallops,no edema  Respiratory:  clear to auscultation bilaterally, normal work of breathing GI: soft, nontender, nondistended, + BS MS: no deformity or atrophy  Skin: warm and dry, no rash Neuro:  Alert and Oriented x 3, Strength and sensation are intact Psych: euthymic mood, full affect  Wt Readings from Last 3 Encounters:  11/22/17 139 lb (63 kg)  10/31/17 143 lb (64.9 kg)  10/06/17 139 lb 4.8 oz (63.2 kg)      Studies/Labs  Reviewed:   EKG:  EKG is not ordered today.    Recent Labs: 10/04/2017: ALT 94; B Natriuretic Peptide 116.9; Hemoglobin 11.9; Magnesium 2.3; Platelets 137; TSH 2.569 10/06/2017: BUN 14; Creatinine, Ser 0.87; Potassium 4.2; Sodium 131   Lipid Panel    Component Value Date/Time   CHOL 135 09/30/2015 0946   TRIG 113 09/30/2015 0946   HDL 32 (L) 09/30/2015 0946   CHOLHDL 4.2 09/30/2015 0946   VLDL 23 09/30/2015 0946   LDLCALC 80 09/30/2015 0946   LDLDIRECT 111 (H) 03/06/2013 1054    Additional studies/ records that were reviewed today include:   Echo 10/05/2017 LV EF: 55% -   60% Study Conclusions  - Left ventricle: The cavity size was normal. Wall thickness was   increased in a pattern of severe LVH. There was moderate   concentric hypertrophy. Systolic function was normal. The   estimated ejection fraction was in the range of 55% to 60%. Wall   motion was normal; there were no regional wall motion   abnormalities. Doppler parameters are consistent with abnormal   left ventricular relaxation (grade 1 diastolic dysfunction). - Aortic valve: Trileaflet; moderately thickened, moderately   calcified leaflets. There was mild stenosis. There was moderate   regurgitation. Valve area (Vmean): 2.08 cm^2. Regurgitation   pressure half-time: 375 ms. - Mitral valve: Calcified annulus. Moderately thickened, moderately   calcified leaflets . Thickening. Mobility was moderately   restricted. The findings are consistent with moderate stenosis.   Mean gradient (D): 6 mm Hg. Valve area by pressure half-time:   1.51 cm^2. Valve area by continuity equation (using LVOT flow):   2.84 cm^2. - Left atrium: The atrium was severely dilated. - Right atrium: The atrium was moderately to severely dilated. - Tricuspid valve: There was moderate regurgitation. Peak RV-RA   gradient (S): 37 mm Hg. - Pulmonary arteries: PA peak pressure: 45 mm Hg (S).  Impressions:  - Normal LV  EF with diastolic  dysfunction. Calcified aortic and   mitral valves with stenosis/regurgitation as above. Elevate right   sided filling pressures.    Echo 10/18/2017 LV EF: 55% -   60% Study Conclusions  - Left ventricle: The cavity size was normal. Wall thickness was   increased in a pattern of moderate LVH. Systolic function was   normal. The estimated ejection fraction was in the range of 55%   to 60%. Wall motion was normal; there were no regional wall   motion abnormalities. Doppler parameters are consistent with   abnormal left ventricular relaxation (grade 1 diastolic   dysfunction). - Aortic valve: There was mild regurgitation. Valve area (VTI):   2.31 cm^2. Valve area (Vmax): 2.07 cm^2. Valve area (Vmean): 2.26   cm^2. - Mitral valve: Calcified annulus. Valve area by pressure   half-time: 2.18 cm^2. Valve area by continuity equation (using   LVOT flow): 1.33 cm^2. - Left atrium: The atrium was moderately dilated.  Impressions:  - Normal LVEF.   Moderate LVH.   Moderate LAE.   MIld MS.   Trace AR.  ASSESSMENT:    1. Chronic diastolic heart failure ([name])   2. AKI (acute kidney injury) ([place])   3. Essential hypertension   4. Hyperlipidemia, unspecified hyperlipidemia type   5. Hypothyroidism, unspecified type   6. Presence of cardiac resynchronization therapy defibrillator (CRT-D)      PLAN:  In order of problems listed above:  1. Chronic diastolic heart failure: She does not have significant JVD or lower extremity edema, however she continued to have significant orthopnea.  I recommended as a trial to increase her Lasix to 20 mg daily.  Would not recommend to increase her diuretic any further given the recent AKI on 60 mg daily of Lasix for 3 days.  Obtain basic metabolic panel today and also in 1 week  2. Acute kidney injury: Improved after soft fluid hydration during the recent hospitalization, will obtain basic metabolic panel today.  3. Hypertension: Blood pressure  elevated today, however normally her blood pressure running in the 140s range.  We will hold off on adjusting blood pressure medication.  May consider increase carvedilol later if blood pressure remain elevated on follow-up  4. Hyperlipidemia: Not on statin.  I have not seen a lipid panel in the Epic system since 2017  5. Hypothyroidism: On Synthroid, managed by primary care provider  6. History of nonischemic cardiomyopathy s/p CRT-D: EF improved with cardiac resynchronization therapy.    Medication Adjustments/Labs and Tests Ordered: Current medicines are reviewed at length with the patient today.  Concerns regarding medicines are outlined above.  Medication changes, Labs and Tests ordered today are listed in the Patient Instructions below. Patient Instructions  Medication Instructions:  INCREASE Lasix 20mg  Take 1 WHOLE tablet once a day If you need a refill on your cardiac medications before your next appointment, please call your pharmacy.  Labwork: Your physician recommends that you return for lab work in: TODAY BMET  Your physician recommends that you return for lab work in: NEXT Tuesday 11/29/2017 [place] in our office  Testing/Procedures: NONE   Follow-Up: Your physician recommends that you schedule a follow-up appointment in: 2-3 [place].  Any Other Special Instructions Will Be Listed Below (If Applicable).                  Hilbert Corrigan, [place]  11/22/2017 5:00 PM    [facility]  [address], [place], [place]  [address] Phone: [phone]; Fax: [phone]

## 2017-11-23 LAB — BASIC METABOLIC PANEL
BUN / CREAT RATIO: 24 (ref 12–28)
BUN: 20 mg/dL (ref 8–27)
CHLORIDE: 92 mmol/L — AB (ref 96–106)
CO2: 25 mmol/L (ref 20–29)
CREATININE: 0.84 mg/dL (ref 0.57–1.00)
Calcium: 9.2 mg/dL (ref 8.7–10.3)
GFR calc non Af Amer: 63 mL/min/{1.73_m2} (ref >59)
GFR, EST AFRICAN AMERICAN: 72 mL/min/{1.73_m2} (ref >59)
Glucose: 91 mg/dL (ref 65–99)
Potassium: 5.5 mmol/L — ABNORMAL HIGH (ref 3.5–5.2)
Sodium: 131 mmol/L — ABNORMAL LOW (ref 134–144)

## 2017-11-23 NOTE — Progress Notes (Signed)
Thanks MCr 

## 2017-11-29 DIAGNOSIS — N179 Acute kidney failure, unspecified: Secondary | ICD-10-CM | POA: Diagnosis not present

## 2017-11-30 ENCOUNTER — Other Ambulatory Visit: Payer: Self-pay

## 2017-11-30 DIAGNOSIS — I428 Other cardiomyopathies: Secondary | ICD-10-CM

## 2017-11-30 LAB — BASIC METABOLIC PANEL
BUN / CREAT RATIO: 33 — AB (ref 12–28)
BUN: 35 mg/dL — AB (ref 8–27)
CO2: 28 mmol/L (ref 20–29)
CREATININE: 1.07 mg/dL — AB (ref 0.57–1.00)
Calcium: 9.3 mg/dL (ref 8.7–10.3)
Chloride: 95 mmol/L — ABNORMAL LOW (ref 96–106)
GFR calc Af Amer: 54 mL/min/{1.73_m2} — ABNORMAL LOW (ref >59)
GFR calc non Af Amer: 47 mL/min/{1.73_m2} — ABNORMAL LOW (ref >59)
GLUCOSE: 100 mg/dL — AB (ref 65–99)
Potassium: 5 mmol/L (ref 3.5–5.2)
Sodium: 135 mmol/L (ref 134–144)

## 2017-11-30 NOTE — Progress Notes (Signed)
Opened in error

## 2017-12-01 LAB — CUP PACEART REMOTE DEVICE CHECK
Battery Remaining Longevity: 17 mo
Battery Voltage: 2.9 V
Brady Statistic AP VP Percent: 7.18 %
Brady Statistic AP VS Percent: 0.09 %
Brady Statistic AS VP Percent: 91.43 %
Brady Statistic AS VS Percent: 1.3 %
Brady Statistic RA Percent Paced: 7.26 %
Brady Statistic RV Percent Paced: 98.41 %
Date Time Interrogation Session: 20190812062603
HighPow Impedance: 43 Ohm
HighPow Impedance: 57 Ohm
Implantable Lead Implant Date: 20090331
Implantable Lead Implant Date: 20090331
Implantable Lead Implant Date: 20090401
Implantable Lead Location: 753858
Implantable Lead Location: 753859
Implantable Lead Location: 753860
Implantable Lead Model: 4194
Implantable Lead Model: 5076
Implantable Lead Model: 6947
Implantable Pulse Generator Implant Date: 20150505
Lead Channel Impedance Value: 342 Ohm
Lead Channel Impedance Value: 361 Ohm
Lead Channel Impedance Value: 399 Ohm
Lead Channel Impedance Value: 475 Ohm
Lead Channel Impedance Value: 475 Ohm
Lead Channel Impedance Value: 589 Ohm
Lead Channel Pacing Threshold Amplitude: 1 V
Lead Channel Pacing Threshold Amplitude: 1.5 V
Lead Channel Pacing Threshold Amplitude: 1.625 V
Lead Channel Pacing Threshold Pulse Width: 0.4 ms
Lead Channel Pacing Threshold Pulse Width: 0.4 ms
Lead Channel Pacing Threshold Pulse Width: 0.4 ms
Lead Channel Sensing Intrinsic Amplitude: 2 mV
Lead Channel Sensing Intrinsic Amplitude: 2 mV
Lead Channel Sensing Intrinsic Amplitude: 21.5 mV
Lead Channel Sensing Intrinsic Amplitude: 21.5 mV
Lead Channel Setting Pacing Amplitude: 2 V
Lead Channel Setting Pacing Amplitude: 2.5 V
Lead Channel Setting Pacing Amplitude: 3.5 V
Lead Channel Setting Pacing Pulse Width: 0.4 ms
Lead Channel Setting Pacing Pulse Width: 0.4 ms
Lead Channel Setting Sensing Sensitivity: 0.3 mV

## 2017-12-05 NOTE — Progress Notes (Signed)
99

## 2017-12-07 ENCOUNTER — Other Ambulatory Visit: Payer: Self-pay

## 2017-12-07 MED ORDER — FUROSEMIDE 20 MG PO TABS: 20.0000 mg | ORAL_TABLET | ORAL | 6 refills | Status: DC

## 2017-12-07 NOTE — Progress Notes (Signed)
Opened in error computer frozen then unfroze and opened encounter.

## 2017-12-09 ENCOUNTER — Other Ambulatory Visit: Payer: Self-pay

## 2017-12-09 DIAGNOSIS — Z79899 Other long term (current) drug therapy: Secondary | ICD-10-CM

## 2017-12-09 NOTE — Progress Notes (Signed)
Called patient, gave lab work, labs ordered. Patient requested orders be mailed to her, sent out.  Verbalized understanding.

## 2017-12-21 ENCOUNTER — Other Ambulatory Visit: Payer: Self-pay | Admitting: Internal Medicine

## 2017-12-21 NOTE — Telephone Encounter (Signed)
Error

## 2017-12-30 ENCOUNTER — Telehealth: Payer: Self-pay | Admitting: Cardiovascular Disease

## 2017-12-30 NOTE — Telephone Encounter (Signed)
Spoke with pt daughter Arrie Aran. She sts that the pt is having some fatigue and feels that her "heart is beating to slow" Adv pt to send a remote transmission for the device clinic to review and I will fwd them the message to look out for it and f/u with the pt.

## 2017-12-30 NOTE — Telephone Encounter (Signed)
New Message:       Per patient daughter, Patient has a Pacemaker and her mother stated her heart is barely beating

## 2017-12-30 NOTE — Telephone Encounter (Signed)
Spoke with pts daughter informed her that I had reviewed the transmission and that pt's device is functioning normally with a deflection when there is a pacemaker spike. Pts daughter voiced understanding

## 2018-01-09 ENCOUNTER — Ambulatory Visit: Payer: Medicare Other | Admitting: Physician Assistant

## 2018-01-26 ENCOUNTER — Ambulatory Visit (INDEPENDENT_AMBULATORY_CARE_PROVIDER_SITE_OTHER): Payer: Medicare Other | Admitting: Cardiology

## 2018-01-26 ENCOUNTER — Encounter: Payer: Self-pay | Admitting: Cardiology

## 2018-01-26 ENCOUNTER — Ambulatory Visit: Payer: Medicare Other | Admitting: Physician Assistant

## 2018-01-26 VITALS — BP 198/96 | HR 78 | Ht 61.0 in | Wt 139.0 lb

## 2018-01-26 DIAGNOSIS — I10 Essential (primary) hypertension: Secondary | ICD-10-CM | POA: Diagnosis not present

## 2018-01-26 DIAGNOSIS — G588 Other specified mononeuropathies: Secondary | ICD-10-CM | POA: Diagnosis not present

## 2018-01-26 DIAGNOSIS — J410 Simple chronic bronchitis: Secondary | ICD-10-CM

## 2018-01-26 DIAGNOSIS — I428 Other cardiomyopathies: Secondary | ICD-10-CM | POA: Diagnosis not present

## 2018-01-26 DIAGNOSIS — R0602 Shortness of breath: Secondary | ICD-10-CM | POA: Diagnosis not present

## 2018-01-26 DIAGNOSIS — Z95810 Presence of automatic (implantable) cardiac defibrillator: Secondary | ICD-10-CM

## 2018-01-26 MED ORDER — LOSARTAN POTASSIUM 100 MG PO TABS: 100.0000 mg | ORAL_TABLET | ORAL | 0 refills | Status: DC

## 2018-01-26 MED ORDER — FUROSEMIDE 20 MG PO TABS
20.0000 mg | ORAL_TABLET | Freq: Every day | ORAL | 6 refills | Status: DC
Start: 2018-01-26 — End: 2018-06-07

## 2018-01-26 MED ORDER — AMLODIPINE BESYLATE 5 MG PO TABS: 5.0000 mg | ORAL_TABLET | Freq: Every day | ORAL | 6 refills | Status: DC

## 2018-01-26 NOTE — Assessment & Plan Note (Signed)
She has seen [facility] in the past. She says she cannot afford her Dulera. I suggested a pulmonary follow up.

## 2018-01-26 NOTE — Progress Notes (Signed)
Thanks, I agree with increasing BP meds MCr

## 2018-01-26 NOTE — Assessment & Plan Note (Signed)
MDT BiV ICD implanted in 2009, gen change in 2015

## 2018-01-26 NOTE — Patient Instructions (Addendum)
Medication Instructions:  TAKE your Losartan every morning  START Norvasc take 5mg  every morning  TAKE 20mg  of Lasix once a day If you need a refill on your cardiac medications before your next appointment, please call your pharmacy.   Lab work: None  If you have labs (blood work) drawn today and your tests are completely normal, you will receive your results only by: Marland Kitchen MyChart Message (if you have MyChart) OR . A paper copy in the mail If you have any lab test that is abnormal or we need to change your treatment, we will call you to review the results.  Testing/Procedures: None   Follow-Up: At [facility], you and your health needs are our priority.  As part of our continuing mission to provide you with exceptional heart care, we have created designated Provider Care Teams.  These Care Teams include your primary Cardiologist (physician) and Advanced Practice Providers (APPs -  Physician Assistants and Nurse Practitioners) who all work together to provide you with the care you need, when you need it. . Your physician recommends that you schedule a follow-up appointment in: 3 months with Dr Sallyanne Kuster ONLY Referred to [facility] pulumonlogy.  Any Other Special Instructions Will Be Listed Below (If Applicable). CHECK YOUR BLOOD PRESSURE AND CONTACT THE OFFICE IF YOUR TOP NUMBER IS OVER 150s CONTACT THE OFFICE WHEN YOU GET HOME AND LET us KNOW THE CORRECT DOSE OF LASIX

## 2018-01-26 NOTE — Assessment & Plan Note (Signed)
Complete normalization of LVEF following initiation of cardiac resynchronization therapy

## 2018-01-26 NOTE — Assessment & Plan Note (Addendum)
Repeat B/P by me 192/72. She says she is taking her Losartan at night. She thinks she is only taking 1/2 dose of Lasix. They mentioned to me the trouble they are having affording her medications. The pt thinks her recent B/P elevation is from driving in from [facility].

## 2018-01-26 NOTE — Assessment & Plan Note (Signed)
Uses a walker- high risk for falls

## 2018-01-26 NOTE — Progress Notes (Signed)
01/26/2018 [name]   05/12/1928  [id number]  Primary Physician No primary care provider on file. Primary Cardiologist: Dr Sallyanne Kuster  HPI:  Pleasant 84 y.o. 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  The patient had a long-standing history of NICM.  Cardiac catheterization in July 1992 showed normal coronaries with a dilated cardiomyopathy.  Myoview in March 2009 showed no ischemia.  Her ejection fraction has been as low as 20% in the past.  After implementation of CRT therapy in 2009, her ejection fraction has returned to normal.  She had a short episode of atrial flutter that measured only 48 seconds in duration and occurred several years ago, this has not recurred.  The patient was not started on systemic anticoagulation. She was admitted in July 2019 with volume overload and acute renal injury.  Digoxin was discontinued.  Renal function improved with gentle fluid hydration.  Echocardiogram obtained 10/18/2017 showed an EF of 55 to 60% with grade 1 DD and mild aortic regurgitation.   She was seen in the office by Octaviano Batty 11/22/17. He suggested she increase her Lasix to 20 mg daily-its not clear she ever did that. Her B/P was noted to be elevated then but the patient says she "always has high blood pressure in the doctors office". She was supposed to see Dr Sallyanne Kuster in f/u but was placed on my schedule.  She is here with her daughter. The patient denies any unusual edema. She is always SOB with exertion. Her weight has been stable since July.    Current Outpatient Medications  Medication Sig Dispense Refill  . alendronate (FOSAMAX) 70 MG tablet Take 70 mg by mouth every Sunday.     Marland Kitchen aspirin EC 81 MG tablet Take 1 tablet (81 mg total) by mouth daily. 90 tablet 3  . Calcium Carbonate-Vitamin D 600-400 MG-UNIT chew tablet Chew 2 tablets by mouth daily.      . carvedilol (COREG) 12.5 MG tablet TAKE 1 TABLET BY MOUTH 2 TIMES DAILY WITH A MEAL. PLEASE MAKE APPT WITH DR. Caryl Comes FOR SEPTEMBER 180 tablet 0  . co-enzyme Q-10 50 MG capsule Take 50 mg by mouth daily. Reported on 09/29/2015    . DULERA 200-5 MCG/ACT AERO Inhale 2 puffs into the lungs daily as needed for wheezing or shortness of breath.   3  . DULoxetine (CYMBALTA) 60 MG capsule Take 60 mg by mouth daily.   2  . furosemide (LASIX) 20 MG tablet Take 1 tablet (20 mg total) by mouth daily. 30 tablet 6  . gabapentin (NEURONTIN) 100 MG capsule Take 100-300 mg by mouth at bedtime as needed (pain).    Marland Kitchen liothyronine (CYTOMEL) 25 MCG tablet Take 25 mcg by mouth daily.    Marland Kitchen losartan (COZAAR) 100 MG tablet Take 1 tablet (100 mg total) by mouth every morning. 90 tablet 0  . Melatonin 10 MG TABS Take 10 mg by mouth at bedtime as needed (sleep).    Marland Kitchen oxyCODONE (ROXICODONE) 15 MG immediate release tablet Take 15 mg by mouth every 6 (six) hours as needed for pain.   0  . SYNTHROID 112 MCG tablet Take 112 mcg by mouth daily before breakfast.     . amLODipine (NORVASC) 5 MG tablet Take 1 tablet (5 mg total) by mouth daily. 30 tablet 6   No current facility-administered medications for this visit.  No Known Allergies  Past Medical History:  Diagnosis Date  . AICD (automatic cardioverter/defibrillator) present 06/27/2007   medtronic  . ARF (acute renal failure) ([place]) 09/2017  . Cancer ([place])    ovarian--stage 4  . Cardiomyopathy ([place])   . CHF (congestive heart failure) ([place])   . Dyspnea   . Peripheral neuropathy   . Systemic hypertension   . Thyroid disease     Social History   Socioeconomic History  . Marital status: Widowed    Spouse name: Not on file  . Number of children: Not on file  . Years of education: Not on file  . Highest education level: Not on file  Occupational History  . Occupation: housewife  Social Needs  . Financial resource strain: Not on file  . Food insecurity:     Worry: Not on file    Inability: Not on file  . Transportation needs:    Medical: Not on file    Non-medical: Not on file  Tobacco Use  . Smoking status: Never Smoker  . Smokeless tobacco: Never Used  Substance and Sexual Activity  . Alcohol use: No  . Drug use: No  . Sexual activity: Not on file  Lifestyle  . Physical activity:    Days per week: Not on file    Minutes per session: Not on file  . Stress: Not on file  Relationships  . Social connections:    Talks on phone: Not on file    Gets together: Not on file    Attends religious service: Not on file    Active member of club or organization: Not on file    Attends meetings of clubs or organizations: Not on file    Relationship status: Not on file  . Intimate partner violence:    Fear of current or ex partner: Not on file    Emotionally abused: Not on file    Physically abused: Not on file    Forced sexual activity: Not on file  Other Topics Concern  . Not on file  Social History Narrative  . Not on file     Family History  Problem Relation Age of Onset  . Lung cancer Mother        died from cancer  . Heart attack Father   . Lung cancer Father   . Heart disease Paternal Grandmother      Review of Systems: General: negative for chills, fever, night sweats or weight changes.  Cardiovascular: negative for chest pain, dyspnea on exertion, edema, orthopnea, palpitations, paroxysmal nocturnal dyspnea or shortness of breath Dermatological: negative for rash Respiratory: negative for cough or wheezing Urologic: negative for hematuria Abdominal: negative for nausea, vomiting, diarrhea, bright red blood per rectum, melena, or hematemesis Neurologic: negative for visual changes, syncope, or dizziness Uses a walker secondary to neuropathy All other systems reviewed and are otherwise negative except as noted above.    Blood pressure (!) 198/96, pulse 78, height 5\' 1"  (1.549 m), weight 139 lb (63 kg), SpO2 98 %.    General appearance: alert, cooperative and no distress Neck: no JVD Lungs: scattered rhonchi and faint expiratory wheezing Heart: regular rate and rhythm Extremities: trace LE edema Skin: cool and dry Neurologic: Grossly normal   ASSESSMENT AND PLAN:   Uncontrolled hypertension Repeat B/P by me 192/72. She says she is taking her Losartan at night. She thinks she is only taking 1/2 dose of Lasix. They mentioned to me the trouble they are having affording her medications.  The pt thinks her recent B/P elevation is from driving in from [facility].   COPD (chronic obstructive pulmonary disease) ([place]) She has seen [facility] in the past. She says she cannot afford her Dulera. I suggested a pulmonary follow up.   Nonischemic cardiomyopathy (HCC) Complete normalization of LVEF following initiation of cardiac resynchronization therapy  Biventricular ICD  MDT BiV ICD implanted in 2009, gen change in 2015  Peripheral neuropathy secondary to chemotherapy Uses a walker- high risk for falls   PLAN  I suggested she take her Losartan in the morning. I added Amlodipine 5 mg daily and asked her to make sure she is taking 20 mg of Lasix daily, not 10 mg. I also suggested a pulmonary consult, she has significant COPD with rhonchi and wheezing on exam and is having trouble affording her inhalers. The patient will monitor her B/P at home (they have a device but are not sure how to use it). If her systolic B/P is consistently > 150 she'll need further adjustment in her medications. She has an appointment with her PCP next month and her b/P can be followed up at that visit.   Kerin Ransom PA-C 01/26/2018 3:01 PM

## 2018-01-31 ENCOUNTER — Telehealth: Payer: Self-pay | Admitting: Cardiovascular Disease

## 2018-01-31 MED ORDER — LOSARTAN POTASSIUM 100 MG PO TABS: 100.0000 mg | ORAL_TABLET | ORAL | 0 refills | Status: DC

## 2018-01-31 NOTE — Telephone Encounter (Signed)
New Message         *STAT* If patient is at the pharmacy, call can be transferred to refill team.   1. Which medications need to be refilled? (please list name of each medication and dose if known) Losartan 100 mg  2. Which pharmacy/location (including street and city if local pharmacy) is medication to be sent [facility]  3. Do they need a 30 day or 90 day supply? [place]

## 2018-02-01 ENCOUNTER — Other Ambulatory Visit: Payer: Self-pay | Admitting: Cardiovascular Disease

## 2018-02-02 ENCOUNTER — Ambulatory Visit: Payer: Medicare Other | Admitting: Cardiovascular Disease

## 2018-02-06 ENCOUNTER — Ambulatory Visit (INDEPENDENT_AMBULATORY_CARE_PROVIDER_SITE_OTHER): Payer: Medicare Other | Admitting: *Deleted

## 2018-02-06 DIAGNOSIS — I428 Other cardiomyopathies: Secondary | ICD-10-CM | POA: Diagnosis not present

## 2018-02-06 DIAGNOSIS — I5032 Chronic diastolic (congestive) heart failure: Secondary | ICD-10-CM

## 2018-02-06 NOTE — Progress Notes (Signed)
Remote ICD transmission.   

## 2018-02-16 DIAGNOSIS — Z23 Encounter for immunization: Secondary | ICD-10-CM | POA: Diagnosis not present

## 2018-02-16 DIAGNOSIS — G894 Chronic pain syndrome: Secondary | ICD-10-CM | POA: Diagnosis not present

## 2018-02-16 DIAGNOSIS — R2681 Unsteadiness on feet: Secondary | ICD-10-CM | POA: Diagnosis not present

## 2018-02-16 DIAGNOSIS — J45909 Unspecified asthma, uncomplicated: Secondary | ICD-10-CM | POA: Diagnosis not present

## 2018-02-16 DIAGNOSIS — M159 Polyosteoarthritis, unspecified: Secondary | ICD-10-CM | POA: Diagnosis not present

## 2018-03-31 LAB — CUP PACEART REMOTE DEVICE CHECK
Battery Voltage: 2.89 V
Brady Statistic AP VP Percent: 1.42 %
Brady Statistic AP VS Percent: 0.04 %
Brady Statistic AS VP Percent: 97.2 %
Brady Statistic RA Percent Paced: 1.46 %
Brady Statistic RV Percent Paced: 97.59 %
HighPow Impedance: 47 Ohm
HighPow Impedance: 56 Ohm
Implantable Lead Implant Date: 20090331
Implantable Lead Implant Date: 20090331
Implantable Lead Location: 753859
Implantable Lead Model: 4194
Implantable Lead Model: 5076
Lead Channel Impedance Value: 342 Ohm
Lead Channel Impedance Value: 342 Ohm
Lead Channel Impedance Value: 361 Ohm
Lead Channel Impedance Value: 456 Ohm
Lead Channel Impedance Value: 589 Ohm
Lead Channel Pacing Threshold Amplitude: 1 V
Lead Channel Pacing Threshold Amplitude: 1.75 V
Lead Channel Pacing Threshold Pulse Width: 0.4 ms
Lead Channel Sensing Intrinsic Amplitude: 1.375 mV
Lead Channel Sensing Intrinsic Amplitude: 20.5 mV
Lead Channel Setting Pacing Amplitude: 2 V
Lead Channel Setting Pacing Amplitude: 2.75 V
Lead Channel Setting Pacing Pulse Width: 0.4 ms
Lead Channel Setting Sensing Sensitivity: 0.3 mV
MDC IDC LEAD IMPLANT DT: 20090401
MDC IDC LEAD LOCATION: 753858
MDC IDC LEAD LOCATION: 753860
MDC IDC MSMT BATTERY REMAINING LONGEVITY: 15 mo
MDC IDC MSMT LEADCHNL LV PACING THRESHOLD AMPLITUDE: 1.625 V
MDC IDC MSMT LEADCHNL LV PACING THRESHOLD PULSEWIDTH: 0.4 ms
MDC IDC MSMT LEADCHNL RA IMPEDANCE VALUE: 475 Ohm
MDC IDC MSMT LEADCHNL RA SENSING INTR AMPL: 1.375 mV
MDC IDC MSMT LEADCHNL RV PACING THRESHOLD PULSEWIDTH: 0.4 ms
MDC IDC MSMT LEADCHNL RV SENSING INTR AMPL: 20.5 mV
MDC IDC PG IMPLANT DT: 20150505
MDC IDC SESS DTM: 20191111094226
MDC IDC SET LEADCHNL LV PACING PULSEWIDTH: 0.4 ms
MDC IDC SET LEADCHNL RV PACING AMPLITUDE: 3.5 V
MDC IDC STAT BRADY AS VS PERCENT: 1.35 %

## 2018-04-06 ENCOUNTER — Encounter: Payer: Self-pay | Admitting: Cardiovascular Disease

## 2018-04-06 ENCOUNTER — Ambulatory Visit (INDEPENDENT_AMBULATORY_CARE_PROVIDER_SITE_OTHER): Payer: Medicare Other | Admitting: Cardiovascular Disease

## 2018-04-06 VITALS — BP 129/71 | HR 87 | Ht 61.0 in | Wt 140.6 lb

## 2018-04-06 DIAGNOSIS — I5032 Chronic diastolic (congestive) heart failure: Secondary | ICD-10-CM

## 2018-04-06 DIAGNOSIS — I4892 Unspecified atrial flutter: Secondary | ICD-10-CM

## 2018-04-06 DIAGNOSIS — I428 Other cardiomyopathies: Secondary | ICD-10-CM

## 2018-04-06 DIAGNOSIS — Z95810 Presence of automatic (implantable) cardiac defibrillator: Secondary | ICD-10-CM | POA: Diagnosis not present

## 2018-04-06 NOTE — Patient Instructions (Signed)
Medication Instructions:  Dr Sallyanne Kuster recommends that you continue on your current medications as directed. Please refer to the Current Medication list given to you today.  If you need a refill on your cardiac medications before your next appointment, please call your pharmacy.   Testing/Procedures: Remote monitoring is used to monitor your Pacemaker or ICD from home. This monitoring reduces the number of office visits required to check your device to one time per year. It allows [name] to keep an eye on the functioning of your device to ensure it is working properly. You are scheduled for a device check from home on Monday, February 10th, 2020. You may send your transmission at any time that day. If you have a wireless device, the transmission will be sent automatically. After your physician reviews your transmission, you will receive a postcard with your next transmission date.  To improve our patient care and to more adequately follow your device, [facility] has decided, as a practice, to start following each patient four times a year with your home monitor. This means that you may experience a remote appointment that is close to an in-office appointment with your physician. Your insurance will apply at the same rate as other remote monitoring transmissions.  Follow-Up: At [facility], you and your health needs are our priority.  As part of our continuing mission to provide you with exceptional heart care, we have created designated Provider Care Teams.  These Care Teams include your primary Cardiologist (physician) and Advanced Practice Providers (APPs -  Physician Assistants and Nurse Practitioners) who all work together to provide you with the care you need, when you need it. You will need a follow up appointment in 12 months. You may see Sanda Klein, MD or one of the following Advanced Practice Providers on your designated Care Team: [place], [place] . Fabian Sharp, PA-C . You will receive a  reminder letter in the mail two months in advance. If you don't receive a letter, please call our office to schedule the follow-up appointment.

## 2018-04-06 NOTE — Progress Notes (Signed)
Patient ID: [name], female   DOB: 1928-07-12, 85 y.o.   MRN: [id number]    Cardiology Office Note    Date:  04/06/2018   ID:  [name], DOB 1928-07-03, MRN [id number]  PCP:  No primary care provider on file.  Cardiologist:  Jolyn Nap, MD;  Sanda Klein, MD   Chief Complaint  Patient presents with  . Congestive Heart Failure  . Pacemaker Check    History of Present Illness:  [name] is a 85 y.o. female with CHF due to nonischemic cardiomyopathy, CRT hyperresponder here for CHF follow-up.  From a cardiac point of view she has been doing quite well.  She has not had any recent problems with shortness of breath with activity (moves relatively slowly and uses a walker), orthopnea, PND, edema, palpitations, syncope, defibrillator discharges.  Her weight has been steady around 140 pounds on office scales.  She has not required any recent diuretic dose adjustments and she only takes furosemide 20 mg once daily.  Denies any falls or injuries.  Pacemaker interrogation shows normal device function.  Her Medtronic Viva CRT-D device was implanted in 2015 (generator change out, initial implant 2009) and has an estimated generator longevity of another 12 months.  She has roughly 98% biventricular pacing.  The levels are generally stable, currently at baseline.  Activity is very poor at less than 0.1 hours/day on the average (she uses a walker and moves very slowly and smoothly so there may be some underestimation of true activity level).  There has been no ventricular tachycardia and no atrial fibrillation.  Reported left ventricular ejection fraction as low as 20% in the past. She had normal coronary arteries by catheterization in 1992. After implementation of resynchronization pacemaker therapy in 2009 her ejection fraction has returned to normal.  Last echo June 2017 showed EF 65-70%.  She had a Nature conservation officer in May 2015 (Medtronic Viva XT). She is not pacemaker dependent. She  has COPD with chronic dyspnea, hyperlipidemia and a history of neuropathy secondary to chemotherapy.  Past Medical History:  Diagnosis Date  . AICD (automatic cardioverter/defibrillator) present 06/27/2007   medtronic  . ARF (acute renal failure) ([place]) 09/2017  . Cancer ([place])    ovarian--stage 4  . Cardiomyopathy ([place])   . CHF (congestive heart failure) ([place])   . Dyspnea   . Peripheral neuropathy   . Systemic hypertension   . Thyroid disease     Past Surgical History:  Procedure Laterality Date  . ABDOMINAL HYSTERECTOMY  oct 2000   along with ovarian ca surg  . APPENDECTOMY    . CARDIAC CATHETERIZATION  09/27/1990   normal coronaries,dilated cardiomyopathy  . CARDIAC DEFIBRILLATOR PLACEMENT  06/27/2007   medtronic concerto  . CHOLECYSTECTOMY    . IMPLANTABLE CARDIOVERTER DEFIBRILLATOR GENERATOR CHANGE N/A 07/31/2013   Procedure: IMPLANTABLE CARDIOVERTER DEFIBRILLATOR GENERATOR CHANGE;  Surgeon: Sanda Klein, MD;  Location: [facility];  Service: Cardiovascular;  Laterality: N/A;  . NM MYOCAR PERF WALL MOTION  06/05/2007   no ischemia    Outpatient Medications Prior to Visit  Medication Sig Dispense Refill  . alendronate (FOSAMAX) 70 MG tablet Take 70 mg by mouth every Sunday.     Marland Kitchen aspirin EC 81 MG tablet Take 1 tablet (81 mg total) by mouth daily. 90 tablet 3  . Calcium Carbonate-Vitamin D 600-400 MG-UNIT chew tablet Chew 2 tablets by mouth daily.     . carvedilol (COREG) 12.5 MG tablet TAKE 1 TABLET BY MOUTH 2  TIMES DAILY WITH A MEAL. PLEASE MAKE APPT WITH DR. Caryl Comes FOR SEPTEMBER 180 tablet 0  . co-enzyme Q-10 50 MG capsule Take 50 mg by mouth daily. Reported on 09/29/2015    . DULERA 200-5 MCG/ACT AERO Inhale 2 puffs into the lungs daily as needed for wheezing or shortness of breath.   3  . DULoxetine (CYMBALTA) 60 MG capsule Take 60 mg by mouth daily.   2  . furosemide (LASIX) 20 MG tablet Take 1 tablet (20 mg total) by mouth daily. 30 tablet 6  . gabapentin (NEURONTIN) 100 MG  capsule Take 100-300 mg by mouth at bedtime as needed (pain).    Marland Kitchen liothyronine (CYTOMEL) 25 MCG tablet Take 25 mcg by mouth daily.    Marland Kitchen losartan (COZAAR) 100 MG tablet Take 1 tablet (100 mg total) by mouth every morning. 90 tablet 0  . Melatonin 10 MG TABS Take 10 mg by mouth at bedtime as needed (sleep).    Marland Kitchen oxyCODONE (ROXICODONE) 15 MG immediate release tablet Take 15 mg by mouth every 6 (six) hours as needed for pain.   0  . SYNTHROID 112 MCG tablet Take 112 mcg by mouth daily before breakfast.     . amLODipine (NORVASC) 5 MG tablet Take 1 tablet (5 mg total) by mouth daily. 30 tablet 6   No facility-administered medications prior to visit.      Allergies:   Patient has no known allergies.   Social History   Socioeconomic History  . Marital status: Widowed    Spouse name: Not on file  . Number of children: Not on file  . Years of education: Not on file  . Highest education level: Not on file  Occupational History  . Occupation: housewife  Social Needs  . Financial resource strain: Not on file  . Food insecurity:    Worry: Not on file    Inability: Not on file  . Transportation needs:    Medical: Not on file    Non-medical: Not on file  Tobacco Use  . Smoking status: Never Smoker  . Smokeless tobacco: Never Used  Substance and Sexual Activity  . Alcohol use: No  . Drug use: No  . Sexual activity: Not on file  Lifestyle  . Physical activity:    Days per week: Not on file    Minutes per session: Not on file  . Stress: Not on file  Relationships  . Social connections:    Talks on phone: Not on file    Gets together: Not on file    Attends religious service: Not on file    Active member of club or organization: Not on file    Attends meetings of clubs or organizations: Not on file    Relationship status: Not on file  Other Topics Concern  . Not on file  Social History Narrative  . Not on file     Family History:  The patient's family history includes Heart  attack in her father; Heart disease in her paternal grandmother; Lung cancer in her father and mother.   ROS:   Please see the history of present illness.    ROS All other systems reviewed and are negative.   PHYSICAL EXAM:   VS:  BP 129/71   Pulse 87   Ht 5\' 1"  (1.549 m)   Wt 140 lb 9.6 oz (63.8 kg)   BMI 26.57 kg/m      General: Alert, oriented x3, no distress, well-healed left subclavian device  site with 2 parallel scars Head: no evidence of trauma, PERRL, EOMI, no exophtalmos or lid lag, no myxedema, no xanthelasma; normal ears, nose and oropharynx Neck: normal jugular venous pulsations and no hepatojugular reflux; brisk carotid pulses without delay and no carotid bruits Chest: clear to auscultation, no signs of consolidation by percussion or palpation, normal fremitus, symmetrical and full respiratory excursions Cardiovascular: normal position and quality of the apical impulse, regular rhythm, normal first and paradoxically split second heart sounds, no murmurs, rubs or gallops Abdomen: no tenderness or distention, no masses by palpation, no abnormal pulsatility or arterial bruits, normal bowel sounds, no hepatosplenomegaly Extremities: no clubbing, cyanosis or edema; 2+ radial, ulnar and brachial pulses bilaterally; 2+ right femoral, posterior tibial and dorsalis pedis pulses; 2+ left femoral, posterior tibial and dorsalis pedis pulses; no subclavian or femoral bruits Neurological: grossly nonfocal Psych: Normal mood and affect   Wt Readings from Last 3 Encounters:  04/06/18 140 lb 9.6 oz (63.8 kg)  01/26/18 139 lb (63 kg)  11/22/17 139 lb (63 kg)      Studies/Labs Reviewed:   EKG:  EKG is ordered today.  The ekg ordered today demonstrates paced, biventricular paced rhythm, positive R waves in lead V1, QTC 442 ms  Recent Labs: BMET    Component Value Date/Time   NA 135 11/29/2017 1542   K 5.0 11/29/2017 1542   CL 95 (L) 11/29/2017 1542   CO2 28 11/29/2017 1542    GLUCOSE 100 (H) 11/29/2017 1542   GLUCOSE 98 10/06/2017 0611   BUN 35 (H) 11/29/2017 1542   CREATININE 1.07 (H) 11/29/2017 1542   CREATININE 0.82 07/26/2013 1238   CALCIUM 9.3 11/29/2017 1542   GFRNONAA 47 (L) 11/29/2017 1542   GFRAA 54 (L) 11/29/2017 1542   Lipid Panel    Component Value Date/Time   CHOL 135 09/30/2015 0946   TRIG 113 09/30/2015 0946   HDL 32 (L) 09/30/2015 0946   CHOLHDL 4.2 09/30/2015 0946   VLDL 23 09/30/2015 0946   LDLCALC 80 09/30/2015 0946   LDLDIRECT 111 (H) 03/06/2013 1054    Additional studies/ records that were reviewed today include:   ASSESSMENT:    1. Chronic diastolic heart failure ([place])   2. Biventricular ICD (implantable cardioverter-defibrillator) in place   3. Nonischemic cardiomyopathy (HCC)   4. Paroxysmal atrial flutter (HCC)      PLAN:  In order of problems listed above:  1. CHF: Appears clinically euvolemic.  Very sedentary due to her back problems.  "Dry weight" seems to be around 140 pounds.  LVEF has normalized.  OptiVol shows stable findings.  Echo from July 2019 shows preserved left ventricular ejection fraction at 55-60% with evidence of diastolic dysfunction.  There may be mild mitral stenosis due to annular calcification but this does not appear to be clinically important. 2. NICMP: Now with normal LVEF. 3. CRT-D: Normal device function. CRT percentage is good.  The generator is anticipated to reach end of service in roughly 1 year at which point I would strongly advise "downgrade" to a CRT-pacemaker.  EF has normalized, she is now almost 85 years old, she has never received VT therapies.  Discussed the reason for a "downgrade" with both the patient and her daughter and they both seem to agree that this is a good idea. 4. Episode of atrial flutter was only 48 seconds in duration and occurred 3 years ago. No anticoagulation at this time. Monitor via her device.     Medication Adjustments/Labs and Tests  Ordered: Current  medicines are reviewed at length with the patient today.  Concerns regarding medicines are outlined above.  Medication changes, Labs and Tests ordered today are listed in the Patient Instructions below. Patient Instructions  Medication Instructions:  Dr Sallyanne Kuster recommends that you continue on your current medications as directed. Please refer to the Current Medication list given to you today.  If you need a refill on your cardiac medications before your next appointment, please call your pharmacy.   Testing/Procedures: Remote monitoring is used to monitor your Pacemaker or ICD from home. This monitoring reduces the number of office visits required to check your device to one time per year. It allows [name] to keep an eye on the functioning of your device to ensure it is working properly. You are scheduled for a device check from home on Monday, February 10th, 2020. You may send your transmission at any time that day. If you have a wireless device, the transmission will be sent automatically. After your physician reviews your transmission, you will receive a postcard with your next transmission date.  To improve our patient care and to more adequately follow your device, [facility] has decided, as a practice, to start following each patient four times a year with your home monitor. This means that you may experience a remote appointment that is close to an in-office appointment with your physician. Your insurance will apply at the same rate as other remote monitoring transmissions.  Follow-Up: At [facility], you and your health needs are our priority.  As part of our continuing mission to provide you with exceptional heart care, we have created designated Provider Care Teams.  These Care Teams include your primary Cardiologist (physician) and Advanced Practice Providers (APPs -  Physician Assistants and Nurse Practitioners) who all work together to provide you with the care you need, when you need  it. You will need a follow up appointment in 12 months. You may see Sanda Klein, MD or one of the following Advanced Practice Providers on your designated Care Team: [place], [place] . Fabian Sharp, PA-C . You will receive a reminder letter in the mail two months in advance. If you don't receive a letter, please call our office to schedule the follow-up appointment.      Signed, Sanda Klein, MD  04/06/2018 5:34 PM    [facility] [address], [place], [place]  [address] Phone: [phone]; Fax: [phone]

## 2018-04-13 NOTE — Addendum Note (Signed)
Addended by: Therisa Doyne on: 04/13/2018 01:32 PM   Modules accepted: Orders

## 2018-05-01 ENCOUNTER — Other Ambulatory Visit: Payer: Self-pay | Admitting: Cardiovascular Disease

## 2018-05-01 NOTE — Telephone Encounter (Signed)
Rx(s) sent to pharmacy electronically.  

## 2018-05-02 LAB — CUP PACEART INCLINIC DEVICE CHECK
Implantable Lead Implant Date: 20090331
Implantable Lead Implant Date: 20090401
Implantable Lead Location: 753858
Implantable Lead Location: 753860
Implantable Lead Model: 5076
Implantable Lead Model: 6947
MDC IDC LEAD IMPLANT DT: 20090331
MDC IDC LEAD LOCATION: 753859
MDC IDC PG IMPLANT DT: 20150505
MDC IDC SESS DTM: 20200204145022

## 2018-05-03 DIAGNOSIS — H2513 Age-related nuclear cataract, bilateral: Secondary | ICD-10-CM | POA: Diagnosis not present

## 2018-05-03 DIAGNOSIS — H401134 Primary open-angle glaucoma, bilateral, indeterminate stage: Secondary | ICD-10-CM | POA: Diagnosis not present

## 2018-05-04 DIAGNOSIS — H25812 Combined forms of age-related cataract, left eye: Secondary | ICD-10-CM | POA: Diagnosis not present

## 2018-05-04 DIAGNOSIS — H401132 Primary open-angle glaucoma, bilateral, moderate stage: Secondary | ICD-10-CM | POA: Diagnosis not present

## 2018-05-05 ENCOUNTER — Telehealth: Payer: Self-pay | Admitting: *Deleted

## 2018-05-05 NOTE — Telephone Encounter (Addendum)
    [facility] Pre-operative Risk Assessment    Request for surgical clearance:  1. What type of surgery is being performed? LEFT EYE  CATARACT   2. When is this surgery scheduled?  05/18/18  3. What type of clearance is required (medical clearance vs. Pharmacy clearance to hold med vs. Both)? MEDICAL MEDTRONIC ICD  4. Are there any medications that need to be held prior to surgery and how long? N/A  5. Practice name and name of physician performing surgery? [facility] [address] [place]  6. What is your office phone number [phone]   7.   What is your office fax number [phone]  8.   Anesthesia type (None, local, MAC, general) ? IV SEDATION   [name] 05/05/2018, 7:39 AM  _________________________________________________________________   (provider comments below)

## 2018-05-08 ENCOUNTER — Ambulatory Visit (INDEPENDENT_AMBULATORY_CARE_PROVIDER_SITE_OTHER): Payer: Medicare Other

## 2018-05-08 DIAGNOSIS — I428 Other cardiomyopathies: Secondary | ICD-10-CM

## 2018-05-08 DIAGNOSIS — I5032 Chronic diastolic (congestive) heart failure: Secondary | ICD-10-CM

## 2018-05-09 LAB — CUP PACEART REMOTE DEVICE CHECK
Battery Voltage: 2.87 V
Brady Statistic AP VP Percent: 0.76 %
Brady Statistic AP VS Percent: 0.02 %
Brady Statistic AS VP Percent: 97.88 %
Brady Statistic AS VS Percent: 1.33 %
HIGH POWER IMPEDANCE MEASURED VALUE: 55 Ohm
HighPow Impedance: 43 Ohm
Implantable Lead Implant Date: 20090331
Implantable Lead Implant Date: 20090401
Implantable Lead Location: 753858
Implantable Lead Location: 753859
Implantable Lead Location: 753860
Implantable Lead Model: 6947
Implantable Pulse Generator Implant Date: 20150505
Lead Channel Impedance Value: 304 Ohm
Lead Channel Impedance Value: 418 Ohm
Lead Channel Impedance Value: 475 Ohm
Lead Channel Impedance Value: 646 Ohm
Lead Channel Pacing Threshold Amplitude: 1 V
Lead Channel Pacing Threshold Amplitude: 1.375 V
Lead Channel Pacing Threshold Pulse Width: 0.4 ms
Lead Channel Pacing Threshold Pulse Width: 0.4 ms
Lead Channel Sensing Intrinsic Amplitude: 15.625 mV
Lead Channel Sensing Intrinsic Amplitude: 15.625 mV
Lead Channel Setting Pacing Amplitude: 2 V
Lead Channel Setting Pacing Amplitude: 2.5 V
Lead Channel Setting Pacing Amplitude: 3 V
Lead Channel Setting Pacing Pulse Width: 0.4 ms
Lead Channel Setting Sensing Sensitivity: 0.3 mV
MDC IDC LEAD IMPLANT DT: 20090331
MDC IDC MSMT BATTERY REMAINING LONGEVITY: 13 mo
MDC IDC MSMT LEADCHNL LV IMPEDANCE VALUE: 361 Ohm
MDC IDC MSMT LEADCHNL RA SENSING INTR AMPL: 1.625 mV
MDC IDC MSMT LEADCHNL RA SENSING INTR AMPL: 1.625 mV
MDC IDC MSMT LEADCHNL RV IMPEDANCE VALUE: 418 Ohm
MDC IDC MSMT LEADCHNL RV PACING THRESHOLD AMPLITUDE: 1.5 V
MDC IDC MSMT LEADCHNL RV PACING THRESHOLD PULSEWIDTH: 0.4 ms
MDC IDC SESS DTM: 20200211015738
MDC IDC SET LEADCHNL RV PACING PULSEWIDTH: 0.4 ms
MDC IDC STAT BRADY RA PERCENT PACED: 0.78 %
MDC IDC STAT BRADY RV PERCENT PACED: 98.16 %

## 2018-05-09 NOTE — Telephone Encounter (Signed)
   Primary Cardiologist: Virl Axe, MD  Chart reviewed as part of pre-operative protocol coverage. Cataract extractions are recognized in guidelines as low risk surgeries that do not typically require specific preoperative testing or holding of blood thinner therapy. Therefore, given past medical history and time since last visit, based on ACC/AHA guidelines, NAYLEAH GAMEL would be at acceptable risk for the planned procedure without further cardiovascular testing.   I will route this recommendation to the requesting party via Epic fax function and remove from pre-op pool.  Please call with questions.   I have personally spoke to Theodoro Doing RN of our device clinic, since cataract surgery is not close to the Medtronic ICD, nothing needs to be done to the ICD during the procedure.   Almyra Deforest, [place] 05/09/2018, 9:33 AM

## 2018-05-19 NOTE — Progress Notes (Signed)
Remote ICD transmission.   

## 2018-05-29 DIAGNOSIS — G620 Drug-induced polyneuropathy: Secondary | ICD-10-CM | POA: Diagnosis not present

## 2018-05-29 DIAGNOSIS — G894 Chronic pain syndrome: Secondary | ICD-10-CM | POA: Diagnosis not present

## 2018-05-29 DIAGNOSIS — J4540 Moderate persistent asthma, uncomplicated: Secondary | ICD-10-CM | POA: Diagnosis not present

## 2018-05-29 DIAGNOSIS — M159 Polyosteoarthritis, unspecified: Secondary | ICD-10-CM | POA: Diagnosis not present

## 2018-06-07 ENCOUNTER — Other Ambulatory Visit: Payer: Self-pay

## 2018-06-07 DIAGNOSIS — H2512 Age-related nuclear cataract, left eye: Secondary | ICD-10-CM | POA: Diagnosis not present

## 2018-06-07 DIAGNOSIS — H25812 Combined forms of age-related cataract, left eye: Secondary | ICD-10-CM | POA: Diagnosis not present

## 2018-06-07 DIAGNOSIS — H401122 Primary open-angle glaucoma, left eye, moderate stage: Secondary | ICD-10-CM | POA: Diagnosis not present

## 2018-06-07 MED ORDER — FUROSEMIDE 20 MG PO TABS
20.0000 mg | ORAL_TABLET | Freq: Every day | ORAL | 4 refills | Status: DC
Start: 2018-06-07 — End: 2018-06-12

## 2018-06-12 ENCOUNTER — Other Ambulatory Visit: Payer: Self-pay | Admitting: *Deleted

## 2018-06-12 MED ORDER — FUROSEMIDE 20 MG PO TABS: 20.0000 mg | ORAL_TABLET | Freq: Every day | ORAL | 5 refills | Status: DC

## 2018-06-20 ENCOUNTER — Other Ambulatory Visit: Payer: Self-pay | Admitting: Cardiology

## 2018-07-31 DIAGNOSIS — R2681 Unsteadiness on feet: Secondary | ICD-10-CM | POA: Diagnosis not present

## 2018-07-31 DIAGNOSIS — M810 Age-related osteoporosis without current pathological fracture: Secondary | ICD-10-CM | POA: Diagnosis not present

## 2018-07-31 DIAGNOSIS — G894 Chronic pain syndrome: Secondary | ICD-10-CM | POA: Diagnosis not present

## 2018-07-31 DIAGNOSIS — M159 Polyosteoarthritis, unspecified: Secondary | ICD-10-CM | POA: Diagnosis not present

## 2018-08-07 ENCOUNTER — Ambulatory Visit (INDEPENDENT_AMBULATORY_CARE_PROVIDER_SITE_OTHER): Payer: Medicare Other | Admitting: *Deleted

## 2018-08-07 ENCOUNTER — Other Ambulatory Visit: Payer: Self-pay

## 2018-08-07 DIAGNOSIS — I5032 Chronic diastolic (congestive) heart failure: Secondary | ICD-10-CM | POA: Diagnosis not present

## 2018-08-07 DIAGNOSIS — I428 Other cardiomyopathies: Secondary | ICD-10-CM

## 2018-08-08 ENCOUNTER — Telehealth: Payer: Self-pay

## 2018-08-08 LAB — CUP PACEART REMOTE DEVICE CHECK
Battery Remaining Longevity: 11 mo
Battery Voltage: 2.86 V
Brady Statistic AP VP Percent: 0.29 %
Brady Statistic AP VS Percent: 0.02 %
Brady Statistic AS VP Percent: 98.38 %
Brady Statistic AS VS Percent: 1.32 %
Brady Statistic RA Percent Paced: 0.3 %
Brady Statistic RV Percent Paced: 98.42 %
Date Time Interrogation Session: 20200511052403
HighPow Impedance: 43 Ohm
HighPow Impedance: 56 Ohm
Implantable Lead Implant Date: 20090331
Implantable Lead Implant Date: 20090331
Implantable Lead Implant Date: 20090401
Implantable Lead Location: 753858
Implantable Lead Location: 753859
Implantable Lead Location: 753860
Implantable Lead Model: 4194
Implantable Lead Model: 5076
Implantable Lead Model: 6947
Implantable Pulse Generator Implant Date: 20150505
Lead Channel Impedance Value: 304 Ohm
Lead Channel Impedance Value: 418 Ohm
Lead Channel Impedance Value: 418 Ohm
Lead Channel Impedance Value: 456 Ohm
Lead Channel Impedance Value: 475 Ohm
Lead Channel Impedance Value: 722 Ohm
Lead Channel Pacing Threshold Amplitude: 0.875 V
Lead Channel Pacing Threshold Amplitude: 1.25 V
Lead Channel Pacing Threshold Amplitude: 1.375 V
Lead Channel Pacing Threshold Pulse Width: 0.4 ms
Lead Channel Pacing Threshold Pulse Width: 0.4 ms
Lead Channel Pacing Threshold Pulse Width: 0.4 ms
Lead Channel Sensing Intrinsic Amplitude: 17.125 mV
Lead Channel Sensing Intrinsic Amplitude: 17.125 mV
Lead Channel Sensing Intrinsic Amplitude: 2.25 mV
Lead Channel Sensing Intrinsic Amplitude: 2.25 mV
Lead Channel Setting Pacing Amplitude: 2 V
Lead Channel Setting Pacing Amplitude: 2.25 V
Lead Channel Setting Pacing Amplitude: 2.75 V
Lead Channel Setting Pacing Pulse Width: 0.4 ms
Lead Channel Setting Pacing Pulse Width: 0.4 ms
Lead Channel Setting Sensing Sensitivity: 0.3 mV

## 2018-08-08 NOTE — Telephone Encounter (Signed)
   Primary Cardiologist: Virl Axe, MD  Chart reviewed as part of pre-operative protocol coverage. Cataract extractions are recognized in guidelines as low risk surgeries that do not typically require specific preoperative testing or holding of blood thinner therapy. Therefore, given past medical history and time since last visit, based on ACC/AHA guidelines, LANORE RENDEROS would be at acceptable risk for the planned procedure without further cardiovascular testing.   I will route this recommendation to the requesting party via Epic fax function and remove from pre-op pool.  Please call with questions.  Tami Lin Duke, PA 08/08/2018, 10:26 AM

## 2018-08-08 NOTE — Telephone Encounter (Signed)
   [facility] Pre-operative Risk Assessment    Request for surgical clearance:  1. What type of surgery is being performed? Cataract Extractions   2. When is this surgery scheduled? 08/16/18   3. What type of clearance is required (medical clearance vs. Pharmacy clearance to hold med vs. Both)? Medical  4. Are there any medications that need to be held prior to surgery and how long? n/a  5. Practice name and name of physician performing surgery? [facility]   6. What is your office phone number [phone] ext [phone]   7.   What is your office fax number [phone]  8.   Anesthesia type (None, local, MAC, general) ? IV Sedation   [name] 08/08/2018, 10:02 AM  _________________________________________________________________   (provider comments below)

## 2018-08-16 NOTE — Progress Notes (Signed)
Remote ICD transmission.   

## 2018-08-20 ENCOUNTER — Other Ambulatory Visit: Payer: Self-pay | Admitting: Cardiology

## 2018-09-04 DIAGNOSIS — H401130 Primary open-angle glaucoma, bilateral, stage unspecified: Secondary | ICD-10-CM | POA: Diagnosis not present

## 2018-09-04 DIAGNOSIS — H401112 Primary open-angle glaucoma, right eye, moderate stage: Secondary | ICD-10-CM | POA: Diagnosis not present

## 2018-09-04 DIAGNOSIS — H25811 Combined forms of age-related cataract, right eye: Secondary | ICD-10-CM | POA: Diagnosis not present

## 2018-09-04 DIAGNOSIS — H2513 Age-related nuclear cataract, bilateral: Secondary | ICD-10-CM | POA: Diagnosis not present

## 2018-09-04 DIAGNOSIS — Z01818 Encounter for other preprocedural examination: Secondary | ICD-10-CM | POA: Diagnosis not present

## 2018-09-04 DIAGNOSIS — H409 Unspecified glaucoma: Secondary | ICD-10-CM | POA: Diagnosis not present

## 2018-09-04 DIAGNOSIS — Z961 Presence of intraocular lens: Secondary | ICD-10-CM | POA: Diagnosis not present

## 2018-09-04 DIAGNOSIS — H401132 Primary open-angle glaucoma, bilateral, moderate stage: Secondary | ICD-10-CM | POA: Diagnosis not present

## 2018-10-13 ENCOUNTER — Telehealth: Payer: Self-pay

## 2018-10-13 NOTE — Telephone Encounter (Signed)
Pt needs an appointment with Dr. Caryl Comes in September.  The best number for the pt is [phone]. I told her that Dr. Caryl Comes scheduler will give her a call back to schedule that appointment.

## 2018-10-16 ENCOUNTER — Other Ambulatory Visit: Payer: Self-pay | Admitting: Physician Assistant

## 2018-10-17 ENCOUNTER — Telehealth: Payer: Self-pay | Admitting: Internal Medicine

## 2018-10-17 NOTE — Telephone Encounter (Signed)
10-16-18 per staff message from [facility] , pt needs September fu with Caryl Comes. Per recall it looks like it was due September 2019. Pt needs phys defib check, since past due can have 1st available with [facility], [facility], or [place]. Lmm @ 450pm for pt to call to make appt.

## 2018-11-06 ENCOUNTER — Ambulatory Visit (INDEPENDENT_AMBULATORY_CARE_PROVIDER_SITE_OTHER): Payer: Medicare Other | Admitting: *Deleted

## 2018-11-06 DIAGNOSIS — I428 Other cardiomyopathies: Secondary | ICD-10-CM

## 2018-11-06 DIAGNOSIS — I5032 Chronic diastolic (congestive) heart failure: Secondary | ICD-10-CM

## 2018-11-06 LAB — CUP PACEART REMOTE DEVICE CHECK
Battery Remaining Longevity: 9 mo
Battery Voltage: 2.84 V
Brady Statistic AP VP Percent: 1.41 %
Brady Statistic AP VS Percent: 0.04 %
Brady Statistic AS VP Percent: 97.2 %
Brady Statistic AS VS Percent: 1.36 %
Brady Statistic RA Percent Paced: 1.44 %
Brady Statistic RV Percent Paced: 98.39 %
Date Time Interrogation Session: 20200810063324
HighPow Impedance: 46 Ohm
HighPow Impedance: 60 Ohm
Implantable Lead Implant Date: 20090331
Implantable Lead Implant Date: 20090331
Implantable Lead Implant Date: 20090401
Implantable Lead Location: 753858
Implantable Lead Location: 753859
Implantable Lead Location: 753860
Implantable Lead Model: 4194
Implantable Lead Model: 5076
Implantable Lead Model: 6947
Implantable Pulse Generator Implant Date: 20150505
Lead Channel Impedance Value: 342 Ohm
Lead Channel Impedance Value: 361 Ohm
Lead Channel Impedance Value: 456 Ohm
Lead Channel Impedance Value: 456 Ohm
Lead Channel Impedance Value: 475 Ohm
Lead Channel Impedance Value: 665 Ohm
Lead Channel Pacing Threshold Amplitude: 0.75 V
Lead Channel Pacing Threshold Amplitude: 1.375 V
Lead Channel Pacing Threshold Amplitude: 1.625 V
Lead Channel Pacing Threshold Pulse Width: 0.4 ms
Lead Channel Pacing Threshold Pulse Width: 0.4 ms
Lead Channel Pacing Threshold Pulse Width: 0.4 ms
Lead Channel Sensing Intrinsic Amplitude: 1.625 mV
Lead Channel Sensing Intrinsic Amplitude: 1.625 mV
Lead Channel Sensing Intrinsic Amplitude: 17.375 mV
Lead Channel Sensing Intrinsic Amplitude: 17.375 mV
Lead Channel Setting Pacing Amplitude: 2 V
Lead Channel Setting Pacing Amplitude: 2.5 V
Lead Channel Setting Pacing Amplitude: 3.75 V
Lead Channel Setting Pacing Pulse Width: 0.4 ms
Lead Channel Setting Pacing Pulse Width: 0.4 ms
Lead Channel Setting Sensing Sensitivity: 0.3 mV

## 2018-11-07 ENCOUNTER — Encounter: Payer: Medicare Other | Admitting: Student

## 2018-11-14 NOTE — Progress Notes (Signed)
Remote ICD transmission.   

## 2018-12-27 ENCOUNTER — Encounter: Payer: Medicare Other | Admitting: Nurse Practitioner

## 2019-01-11 ENCOUNTER — Other Ambulatory Visit: Payer: Self-pay | Admitting: Cardiovascular Disease

## 2019-01-12 NOTE — Progress Notes (Signed)
Electrophysiology Office Note Date: 01/15/2019  ID:  [name], DOB 1929/03/07, MRN [id number]  PCP: Patient, No Pcp Per Primary Cardiologist: Virl Axe, MD Electrophysiologist: Sanda Klein, MD  CC: Routine ICD follow-up  [name] is a 85 y.o. female seen today for Dr. Caryl Comes.  They present today for routine electrophysiology followup.  Since last being seen in our clinic, the patient reports doing very well. They deny chest pain, palpitations, dyspnea, PND, orthopnea, nausea, vomiting, dizziness, syncope, edema, weight gain, or early satiety.  She has not had ICD shocks. At her last visit with Dr. Sallyanne Kuster they discussed gen change and "downgrade" to a CRT-P. Her and her daughter now say they were offended by this conversation and felt as if she would be "given up on" or that we would just "let her die".   Device History: Medtronic BiV ICD implanted 05/2007, LV dislodged and revised 06/2007 gen change 07/2013 for primary prevention History of appropriate therapy: No History of AAD therapy: No   Past Medical History:  Diagnosis Date  . AICD (automatic cardioverter/defibrillator) present 06/27/2007   medtronic  . ARF (acute renal failure) ([place]) 09/2017  . Cancer ([place])    ovarian--stage 4  . Cardiomyopathy ([place])   . CHF (congestive heart failure) ([place])   . Dyspnea   . Peripheral neuropathy   . Systemic hypertension   . Thyroid disease    Past Surgical History:  Procedure Laterality Date  . ABDOMINAL HYSTERECTOMY  oct 2000   along with ovarian ca surg  . APPENDECTOMY    . CARDIAC CATHETERIZATION  09/27/1990   normal coronaries,dilated cardiomyopathy  . CARDIAC DEFIBRILLATOR PLACEMENT  06/27/2007   medtronic concerto  . CHOLECYSTECTOMY    . IMPLANTABLE CARDIOVERTER DEFIBRILLATOR GENERATOR CHANGE N/A 07/31/2013   Procedure: IMPLANTABLE CARDIOVERTER DEFIBRILLATOR GENERATOR CHANGE;  Surgeon: Sanda Klein, MD;  Location: [facility];  Service: Cardiovascular;   Laterality: N/A;  . NM MYOCAR PERF WALL MOTION  06/05/2007   no ischemia    Current Outpatient Medications  Medication Sig Dispense Refill  . alendronate (FOSAMAX) 70 MG tablet Take 70 mg by mouth every Sunday.     Marland Kitchen amLODipine (NORVASC) 5 MG tablet TAKE 1 TABLET BY MOUTH EVERY DAY 90 tablet 2  . aspirin EC 81 MG tablet Take 1 tablet (81 mg total) by mouth daily. 90 tablet 3  . Calcium Carbonate-Vitamin D 600-400 MG-UNIT chew tablet Chew 2 tablets by mouth daily.     . carvedilol (COREG) 12.5 MG tablet TAKE 1 TABLET BY MOUTH 2 TIMES DAILY WITH A MEAL. PLEASE MAKE APPT WITH DR. Caryl Comes FOR SEPTEMBER 180 tablet 0  . co-enzyme Q-10 50 MG capsule Take 50 mg by mouth daily. Reported on 09/29/2015    . DULERA 200-5 MCG/ACT AERO Inhale 2 puffs into the lungs daily as needed for wheezing or shortness of breath.   3  . DULoxetine (CYMBALTA) 60 MG capsule Take 60 mg by mouth daily.   2  . furosemide (LASIX) 20 MG tablet TAKE 1 TABLET BY MOUTH EVERY DAY 90 tablet 1  . gabapentin (NEURONTIN) 100 MG capsule Take 100-300 mg by mouth at bedtime as needed (pain).    Marland Kitchen liothyronine (CYTOMEL) 25 MCG tablet Take 25 mcg by mouth daily.    Marland Kitchen losartan (COZAAR) 100 MG tablet Take 1 tablet (100 mg total) by mouth daily. 90 tablet 3  . Melatonin 10 MG TABS Take 10 mg by mouth at bedtime as needed (sleep).    Marland Kitchen  oxyCODONE (ROXICODONE) 15 MG immediate release tablet Take 15 mg by mouth every 6 (six) hours as needed for pain.   0  . SYNTHROID 112 MCG tablet Take 112 mcg by mouth daily before breakfast.      No current facility-administered medications for this visit.     Allergies:   Patient has no known allergies.   Social History: Social History   Socioeconomic History  . Marital status: Widowed    Spouse name: Not on file  . Number of children: Not on file  . Years of education: Not on file  . Highest education level: Not on file  Occupational History  . Occupation: housewife  Social Needs  . Financial  resource strain: Not on file  . Food insecurity    Worry: Not on file    Inability: Not on file  . Transportation needs    Medical: Not on file    Non-medical: Not on file  Tobacco Use  . Smoking status: Never Smoker  . Smokeless tobacco: Never Used  Substance and Sexual Activity  . Alcohol use: No  . Drug use: No  . Sexual activity: Not on file  Lifestyle  . Physical activity    Days per week: Not on file    Minutes per session: Not on file  . Stress: Not on file  Relationships  . Social Herbalist on phone: Not on file    Gets together: Not on file    Attends religious service: Not on file    Active member of club or organization: Not on file    Attends meetings of clubs or organizations: Not on file    Relationship status: Not on file  . Intimate partner violence    Fear of current or ex partner: Not on file    Emotionally abused: Not on file    Physically abused: Not on file    Forced sexual activity: Not on file  Other Topics Concern  . Not on file  Social History Narrative  . Not on file    Family History: Family History  Problem Relation Age of Onset  . Lung cancer Mother        died from cancer  . Heart attack Father   . Lung cancer Father   . Heart disease Paternal Grandmother     Review of Systems: All other systems reviewed and are otherwise negative except as noted above.  Physical Exam: Vitals:   01/15/19 1304  BP: 126/72  Pulse: 77  SpO2: 90%  Weight: 145 lb (65.8 kg)  Height: 5\' 1"  (1.549 m)     GEN- The patient is well appearing, alert and oriented x 3 today.   HEENT: normocephalic, atraumatic; sclera clear, conjunctiva pink; hearing intact; oropharynx clear; neck supple, no JVP Lymph- no cervical lymphadenopathy Lungs- Clear to ausculation bilaterally, normal work of breathing.  No wheezes, rales, rhonchi Heart- Regular rate and rhythm, no murmurs, rubs or gallops, PMI not laterally displaced GI- soft, non-tender,  non-distended, bowel sounds present, no hepatosplenomegaly Extremities- no clubbing, cyanosis, or edema; DP/PT/radial pulses 2+ bilaterally MS- no significant deformity or atrophy Skin- warm and dry, no rash or lesion; ICD pocket well healed Psych- euthymic mood, full affect Neuro- strength and sensation are intact  ICD interrogation- reviewed in detail today,  See PACEART report  EKG:  EKG is ordered today. The ekg ordered today shows As V paced at 77 bpm, personally reviewed  Recent Labs: No results found for  requested labs within last 8760 hours.   Wt Readings from Last 3 Encounters:  01/15/19 145 lb (65.8 kg)  04/06/18 140 lb 9.6 oz (63.8 kg)  01/26/18 139 lb (63 kg)     Other studies Reviewed: Additional studies/ records that were reviewed today include: Echo 09/2017 with normal EF, most recent labwork   Assessment and Plan:  1.  NICM with normalized EF s/p Medtronic CRT-D  euvolemic today Stable on an appropriate medical regimen Normal ICD function See [facility] report No changes today BMET today on lasix and losartan We had an in depth discussion about indications for ICD, her advanced aged, no previous history of ICD shocks, and normalized EF. Her and her daughter are both on the fence about having the "downgrade" and wish to think on it further, and discuss with Dr. Caryl Comes in follow up.   2. HTN Continue current medications  Current medicines are reviewed at length with the patient today.   The patient does not have concerns regarding her medicines.  The following changes were made today:  none  Labs/ tests ordered today include:  Orders Placed This Encounter  Procedures  . Basic metabolic panel  . EKG 12-Lead    Disposition:   Follow up with Dr. Caryl Comes in 5 months to discuss gen change +/- POSSIBLE downgrade to CRT-P.  Signed, Shirley Friar, PA-C  01/15/2019 2:28 PM  [address] [id number]  [phone] (office) [phone] (fax)

## 2019-01-15 ENCOUNTER — Encounter (INDEPENDENT_AMBULATORY_CARE_PROVIDER_SITE_OTHER): Payer: Self-pay

## 2019-01-15 ENCOUNTER — Encounter: Payer: Self-pay | Admitting: Student

## 2019-01-15 ENCOUNTER — Other Ambulatory Visit: Payer: Self-pay

## 2019-01-15 ENCOUNTER — Ambulatory Visit (INDEPENDENT_AMBULATORY_CARE_PROVIDER_SITE_OTHER): Payer: Medicare Other | Admitting: Student

## 2019-01-15 VITALS — BP 126/72 | HR 77 | Ht 61.0 in | Wt 145.0 lb

## 2019-01-15 DIAGNOSIS — I5032 Chronic diastolic (congestive) heart failure: Secondary | ICD-10-CM | POA: Diagnosis not present

## 2019-01-15 DIAGNOSIS — E039 Hypothyroidism, unspecified: Secondary | ICD-10-CM

## 2019-01-15 DIAGNOSIS — Z95810 Presence of automatic (implantable) cardiac defibrillator: Secondary | ICD-10-CM | POA: Diagnosis not present

## 2019-01-15 DIAGNOSIS — I428 Other cardiomyopathies: Secondary | ICD-10-CM | POA: Diagnosis not present

## 2019-01-15 DIAGNOSIS — I10 Essential (primary) hypertension: Secondary | ICD-10-CM

## 2019-01-15 LAB — CUP PACEART INCLINIC DEVICE CHECK
Battery Remaining Longevity: 6 mo
Battery Voltage: 2.81 V
Brady Statistic AP VP Percent: 1.44 %
Brady Statistic AP VS Percent: 0.04 %
Brady Statistic AS VP Percent: 97.18 %
Brady Statistic AS VS Percent: 1.34 %
Brady Statistic RA Percent Paced: 1.48 %
Brady Statistic RV Percent Paced: 98.34 %
Date Time Interrogation Session: 20201019143352
HighPow Impedance: 50 Ohm
HighPow Impedance: 65 Ohm
Implantable Lead Implant Date: 20090331
Implantable Lead Implant Date: 20090331
Implantable Lead Implant Date: 20090401
Implantable Lead Location: 753858
Implantable Lead Location: 753859
Implantable Lead Location: 753860
Implantable Lead Model: 4194
Implantable Lead Model: 5076
Implantable Lead Model: 6947
Implantable Pulse Generator Implant Date: 20150505
Lead Channel Impedance Value: 361 Ohm
Lead Channel Impedance Value: 399 Ohm
Lead Channel Impedance Value: 456 Ohm
Lead Channel Impedance Value: 475 Ohm
Lead Channel Impedance Value: 513 Ohm
Lead Channel Impedance Value: 703 Ohm
Lead Channel Pacing Threshold Amplitude: 0.75 V
Lead Channel Pacing Threshold Amplitude: 1.25 V
Lead Channel Pacing Threshold Amplitude: 1.625 V
Lead Channel Pacing Threshold Pulse Width: 0.4 ms
Lead Channel Pacing Threshold Pulse Width: 0.4 ms
Lead Channel Pacing Threshold Pulse Width: 0.4 ms
Lead Channel Sensing Intrinsic Amplitude: 1.625 mV
Lead Channel Sensing Intrinsic Amplitude: 15 mV
Lead Channel Sensing Intrinsic Amplitude: 16.5 mV
Lead Channel Sensing Intrinsic Amplitude: 2.125 mV
Lead Channel Setting Pacing Amplitude: 2 V
Lead Channel Setting Pacing Amplitude: 2.25 V
Lead Channel Setting Pacing Amplitude: 3.25 V
Lead Channel Setting Pacing Pulse Width: 0.4 ms
Lead Channel Setting Pacing Pulse Width: 0.4 ms
Lead Channel Setting Sensing Sensitivity: 0.3 mV

## 2019-01-15 NOTE — Patient Instructions (Signed)
Medication Instructions:   Your physician recommends that you continue on your current medications as directed. Please refer to the Current Medication list given to you today.  *If you need a refill on your cardiac medications before your next appointment, please call your pharmacy*  Lab Work:  You will have labs drawn today: BMET  If you have labs (blood work) drawn today and your tests are completely normal, you will receive your results only by: Marland Kitchen MyChart Message (if you have MyChart) OR . A paper copy in the mail If you have any lab test that is abnormal or we need to change your treatment, we will call you to review the results.  Testing/Procedures:  None ordered today  Follow-Up: At [facility], you and your health needs are our priority.  As part of our continuing mission to provide you with exceptional heart care, we have created designated Provider Care Teams.  These Care Teams include your primary Cardiologist (physician) and Advanced Practice Providers (APPs -  Physician Assistants and Nurse Practitioners) who all work together to provide you with the care you need, when you need it.  Your next appointment:   5 months  The format for your next appointment:   In Person  Provider:   Virl Axe, MD

## 2019-01-16 ENCOUNTER — Emergency Department (HOSPITAL_COMMUNITY)
Admission: EM | Admit: 2019-01-16 | Discharge: 2019-01-16 | Disposition: A | Discharge status: Home / Self Care | Payer: Medicare Other | Attending: Emergency Medicine | Admitting: Emergency Medicine

## 2019-01-16 ENCOUNTER — Other Ambulatory Visit: Payer: Self-pay

## 2019-01-16 ENCOUNTER — Emergency Department (HOSPITAL_COMMUNITY): Payer: Medicare Other

## 2019-01-16 ENCOUNTER — Encounter (HOSPITAL_COMMUNITY): Payer: Self-pay | Admitting: *Deleted

## 2019-01-16 DIAGNOSIS — Z79899 Other long term (current) drug therapy: Secondary | ICD-10-CM | POA: Diagnosis not present

## 2019-01-16 DIAGNOSIS — I429 Cardiomyopathy, unspecified: Secondary | ICD-10-CM | POA: Insufficient documentation

## 2019-01-16 DIAGNOSIS — I110 Hypertensive heart disease with heart failure: Secondary | ICD-10-CM | POA: Diagnosis not present

## 2019-01-16 DIAGNOSIS — E039 Hypothyroidism, unspecified: Secondary | ICD-10-CM | POA: Insufficient documentation

## 2019-01-16 DIAGNOSIS — I5032 Chronic diastolic (congestive) heart failure: Secondary | ICD-10-CM | POA: Diagnosis not present

## 2019-01-16 DIAGNOSIS — I4892 Unspecified atrial flutter: Secondary | ICD-10-CM | POA: Diagnosis not present

## 2019-01-16 DIAGNOSIS — E785 Hyperlipidemia, unspecified: Secondary | ICD-10-CM | POA: Insufficient documentation

## 2019-01-16 DIAGNOSIS — Z8041 Family history of malignant neoplasm of ovary: Secondary | ICD-10-CM | POA: Diagnosis not present

## 2019-01-16 DIAGNOSIS — Z7982 Long term (current) use of aspirin: Secondary | ICD-10-CM | POA: Insufficient documentation

## 2019-01-16 DIAGNOSIS — E875 Hyperkalemia: Secondary | ICD-10-CM | POA: Insufficient documentation

## 2019-01-16 DIAGNOSIS — J449 Chronic obstructive pulmonary disease, unspecified: Secondary | ICD-10-CM | POA: Insufficient documentation

## 2019-01-16 LAB — CBC WITH DIFFERENTIAL/PLATELET
Abs Immature Granulocytes: 0.02 10*3/uL (ref 0.00–0.07)
Basophils Absolute: 0.1 10*3/uL (ref 0.0–0.1)
Basophils Relative: 1 %
Eosinophils Absolute: 0.9 10*3/uL — ABNORMAL HIGH (ref 0.0–0.5)
Eosinophils Relative: 13 %
HCT: 39.3 % (ref 36.0–46.0)
Hemoglobin: 12.4 g/dL (ref 12.0–15.0)
Immature Granulocytes: 0 %
Lymphocytes Relative: 14 %
Lymphs Abs: 1 10*3/uL (ref 0.7–4.0)
MCH: 31 pg (ref 26.0–34.0)
MCHC: 31.6 g/dL (ref 30.0–36.0)
MCV: 98.3 fL (ref 80.0–100.0)
Monocytes Absolute: 0.5 10*3/uL (ref 0.1–1.0)
Monocytes Relative: 7 %
Neutro Abs: 4.5 10*3/uL (ref 1.7–7.7)
Neutrophils Relative %: 65 %
Platelets: 185 10*3/uL (ref 150–400)
RBC: 4 MIL/uL (ref 3.87–5.11)
RDW: 12.6 % (ref 11.5–15.5)
WBC: 6.9 10*3/uL (ref 4.0–10.5)
nRBC: 0 % (ref 0.0–0.2)

## 2019-01-16 LAB — COMPREHENSIVE METABOLIC PANEL
ALT: 11 U/L (ref 0–44)
AST: 16 U/L (ref 15–41)
Albumin: 3.5 g/dL (ref 3.5–5.0)
Alkaline Phosphatase: 73 U/L (ref 38–126)
Anion gap: 8 (ref 5–15)
BUN: 22 mg/dL (ref 8–23)
CO2: 27 mmol/L (ref 22–32)
Calcium: 9 mg/dL (ref 8.9–10.3)
Chloride: 97 mmol/L — ABNORMAL LOW (ref 98–111)
Creatinine, Ser: 0.81 mg/dL (ref 0.44–1.00)
GFR calc Af Amer: >60 mL/min (ref >60)
GFR calc non Af Amer: >60 mL/min (ref >60)
Glucose, Bld: 113 mg/dL — ABNORMAL HIGH (ref 70–99)
Potassium: 5.2 mmol/L — ABNORMAL HIGH (ref 3.5–5.1)
Sodium: 132 mmol/L — ABNORMAL LOW (ref 135–145)
Total Bilirubin: 0.5 mg/dL (ref 0.3–1.2)
Total Protein: 7.9 g/dL (ref 6.5–8.1)

## 2019-01-16 LAB — BASIC METABOLIC PANEL
BUN/Creatinine Ratio: 21 (ref 12–28)
BUN: 22 mg/dL (ref 8–27)
CO2: 25 mmol/L (ref 20–29)
Calcium: 9.2 mg/dL (ref 8.7–10.3)
Chloride: 98 mmol/L (ref 96–106)
Creatinine, Ser: 1.05 mg/dL — ABNORMAL HIGH (ref 0.57–1.00)
GFR calc Af Amer: 55 mL/min/{1.73_m2} — ABNORMAL LOW (ref >59)
GFR calc non Af Amer: 48 mL/min/{1.73_m2} — ABNORMAL LOW (ref >59)
Glucose: 93 mg/dL (ref 65–99)
Potassium: 6.1 mmol/L (ref 3.5–5.2)
Sodium: 137 mmol/L (ref 134–144)

## 2019-01-16 MED ORDER — CARVEDILOL 12.5 MG PO TABS
12.5000 mg | ORAL_TABLET | Freq: Once | ORAL | Status: AC
  Administered 2019-01-16: 12.5 mg via ORAL
  Filled 2019-01-16: qty 1

## 2019-01-16 NOTE — Progress Notes (Signed)
Noted. Pt in ED currently.

## 2019-01-16 NOTE — ED Triage Notes (Signed)
Pt was called this morning to come to hospital due to high Potassium level

## 2019-01-16 NOTE — ED Notes (Signed)
Unsuccessful IV attempt x2.  

## 2019-01-16 NOTE — ED Provider Notes (Signed)
[facility] DEPT Provider Note   CSN: [id number] Arrival date & time: 01/16/19  1109     History   Chief Complaint Chief Complaint  Patient presents with  . abnormal labs    HPI [name] is a 85 y.o. female.     HPI 85 year old female with a past medical history significant for CHF, asthma cardiomyopathy, history of ovarian cancer, thyroid disease, hypertension who presents to the emergency department today for evaluation following a blood test that showed elevated potassium levels.  Patient went to her cardiologist yesterday for routine follow-up visit.  While there she did have blood work with a BMP that showed a potassium of 6.1.  She was notified today that she should come to the ER for elevated potassium.  Patient denies any specific complaints.  She states of the past couple months she been having some intermittent shortness of breath at night.  She states this is resolved with her rescue albuterol inhaler.  Her primary care doctor is aware of this.  Patient denies any new chest pain or shortness of breath.  She does report a productive cough that seems to be worse with the past couple of weeks.  She states that she lives in a house with a lot of animals and thinks that it could be allergy related.  Patient denies any abdominal pain, nausea or vomiting.  No fevers or chills.  No leg swelling or exertional dyspnea.  She has taken no medications for symptoms prior to arrival.  She has not taken her blood pressure medicine this morning.  Patient denies any history of hyperkalemia.  She takes no potassium supplements.  She is not on spironolactone. Past Medical History:  Diagnosis Date  . AICD (automatic cardioverter/defibrillator) present 06/27/2007   medtronic  . ARF (acute renal failure) ([place]) 09/2017  . Cancer ([place])    ovarian--stage 4  . Cardiomyopathy ([place])   . CHF (congestive heart failure) ([place])   . Dyspnea   . Peripheral neuropathy   .  Systemic hypertension   . Thyroid disease     Patient Active Problem List   Diagnosis Date Noted  . Digoxin toxicity 10/04/2017  . Closed fracture of distal end of radius 06/29/2017  . Acute encephalopathy   . AKI (acute kidney injury) ([place])   . Dysarthria 09/29/2015  . Abnormal liver function 09/29/2015  . Acute renal failure (ARF) ([place]) 09/29/2015  . Hyponatremia 09/29/2015  . Small bowel obstruction ([place]) 05/17/2015  . Hypothyroidism 05/17/2015  . Uncontrolled hypertension 05/17/2015  . Hypocalcemia 05/17/2015  . Leukocytosis 05/17/2015  . Paroxysmal atrial flutter ([place]) 04/24/2015  . Incarcerated incisional hernia 03/28/2015  . SBO (small bowel obstruction) ([place]) 03/28/2015  . Nonischemic cardiomyopathy ([place]) 11/27/2012  . Biventricular ICD  11/27/2012  . Chronic diastolic heart failure ([place]) 11/27/2012  . Peripheral neuropathy secondary to chemotherapy 11/27/2012  . Hyperlipidemia 11/27/2012  . COPD (chronic obstructive pulmonary disease) ([place]) 12/03/2011  . DOE (dyspnea on exertion) 11/05/2011    Past Surgical History:  Procedure Laterality Date  . ABDOMINAL HYSTERECTOMY  oct 2000   along with ovarian ca surg  . APPENDECTOMY    . CARDIAC CATHETERIZATION  09/27/1990   normal coronaries,dilated cardiomyopathy  . CARDIAC DEFIBRILLATOR PLACEMENT  06/27/2007   medtronic concerto  . CHOLECYSTECTOMY    . IMPLANTABLE CARDIOVERTER DEFIBRILLATOR GENERATOR CHANGE N/A 07/31/2013   Procedure: IMPLANTABLE CARDIOVERTER DEFIBRILLATOR GENERATOR CHANGE;  Surgeon: Sanda Klein, MD;  Location: [facility] CATH LAB;  Service: Cardiovascular;  Laterality:  N/A;  . NM MYOCAR PERF WALL MOTION  06/05/2007   no ischemia     OB History   No obstetric history on file.      Home Medications    Prior to Admission medications   Medication Sig Start Date End Date Taking? Authorizing Provider  alendronate (FOSAMAX) 70 MG tablet Take 70 mg by mouth every Sunday.  09/01/15   [provider]   amLODipine (NORVASC) 5 MG tablet TAKE 1 TABLET BY MOUTH EVERY DAY 08/22/18   Erlene Quan, PA-C  aspirin EC 81 MG tablet Take 1 tablet (81 mg total) by mouth daily. 10/03/17   Croitoru, Mihai, MD  Calcium Carbonate-Vitamin D 600-400 MG-UNIT chew tablet Chew 2 tablets by mouth daily.     [provider]  carvedilol (COREG) 12.5 MG tablet TAKE 1 TABLET BY MOUTH 2 TIMES DAILY WITH A MEAL. PLEASE MAKE APPT WITH DR. Caryl Comes FOR SEPTEMBER 01/12/19   Croitoru, Mihai, MD  co-enzyme Q-10 50 MG capsule Take 50 mg by mouth daily. Reported on 09/29/2015    [provider]  DULERA 200-5 MCG/ACT AERO Inhale 2 puffs into the lungs daily as needed for wheezing or shortness of breath.  09/22/15   [provider]  DULoxetine (CYMBALTA) 60 MG capsule Take 60 mg by mouth daily.  08/25/15   [provider]  furosemide (LASIX) 20 MG tablet TAKE 1 TABLET BY MOUTH EVERY DAY 10/16/18   Croitoru, Mihai, MD  gabapentin (NEURONTIN) 100 MG capsule Take 100-300 mg by mouth at bedtime as needed (pain).    [provider]  liothyronine (CYTOMEL) 25 MCG tablet Take 25 mcg by mouth daily. 07/02/15   [provider]  losartan (COZAAR) 100 MG tablet Take 1 tablet (100 mg total) by mouth daily. 05/01/18   Croitoru, Mihai, MD  Melatonin 10 MG TABS Take 10 mg by mouth at bedtime as needed (sleep).    [provider]  oxyCODONE (ROXICODONE) 15 MG immediate release tablet Take 15 mg by mouth every 6 (six) hours as needed for pain.  02/28/15   [provider]  SYNTHROID 112 MCG tablet Take 112 mcg by mouth daily before breakfast.  09/12/15   [provider]    Family History Family History  Problem Relation Age of Onset  . Lung cancer Mother        died from cancer  . Heart attack Father   . Lung cancer Father   . Heart disease Paternal Grandmother     Social History Social History   Tobacco Use  . Smoking status: Never Smoker  . Smokeless tobacco: Never Used   Substance Use Topics  . Alcohol use: No  . Drug use: No     Allergies   Patient has no known allergies.   Review of Systems Review of Systems  Constitutional: Negative for chills and fever.  HENT: Negative for congestion and sore throat.   Eyes: Negative for discharge.  Respiratory: Positive for cough, shortness of breath and wheezing. Negative for chest tightness.   Cardiovascular: Negative for chest pain, palpitations and leg swelling.  Gastrointestinal: Negative for abdominal pain, diarrhea, nausea and vomiting.  Genitourinary: Negative for hematuria.  Musculoskeletal: Negative for myalgias.  Skin: Negative for color change.  Neurological: Negative for headaches.  Psychiatric/Behavioral: Negative for confusion.     Physical Exam Updated Vital Signs BP (!) 190/86 (BP Location: Left Arm)   Pulse 78   Temp 98.5 F (36.9 C) (Oral)   Resp  16   Ht 5\' 1"  (1.549 m)   Wt 60.3 kg   SpO2 93%   BMI 25.13 kg/m   Physical Exam Vitals signs and nursing note reviewed.  Constitutional:      General: She is not in acute distress.    Appearance: She is well-developed. She is not ill-appearing or toxic-appearing.  HENT:     Head: Normocephalic and atraumatic.     Nose: Nose normal.     Mouth/Throat:     Mouth: Mucous membranes are moist.  Eyes:     General:        Right eye: No discharge.        Left eye: No discharge.     Conjunctiva/sclera: Conjunctivae normal.     Pupils: Pupils are equal, round, and reactive to light.  Neck:     Musculoskeletal: Normal range of motion and neck supple. No neck rigidity.  Cardiovascular:     Rate and Rhythm: Normal rate and regular rhythm.     Heart sounds: Normal heart sounds.  Pulmonary:     Effort: Pulmonary effort is normal. No respiratory distress.     Breath sounds: No stridor. Wheezing present. No rhonchi or rales.  Chest:     Chest wall: No tenderness.  Abdominal:     General: Bowel sounds are normal.     Palpations:  Abdomen is soft.     Tenderness: There is no abdominal tenderness. There is no right CVA tenderness, left CVA tenderness, guarding or rebound.  Musculoskeletal: Normal range of motion.        General: No tenderness.  Lymphadenopathy:     Cervical: No cervical adenopathy.  Skin:    General: Skin is warm and dry.     Capillary Refill: Capillary refill takes less than 2 seconds.  Neurological:     Mental Status: She is alert and oriented to person, place, and time.  Psychiatric:        Behavior: Behavior normal.        Thought Content: Thought content normal.        Judgment: Judgment normal.      ED Treatments / Results  Labs (all labs ordered are listed, but only abnormal results are displayed) Labs Reviewed  CBC WITH DIFFERENTIAL/PLATELET - Abnormal; Notable for the following components:      Result Value   Eosinophils Absolute 0.9 (*)    All other components within normal limits  COMPREHENSIVE METABOLIC PANEL - Abnormal; Notable for the following components:   Sodium 132 (*)    Potassium 5.2 (*)    Chloride 97 (*)    Glucose, Bld 113 (*)    All other components within normal limits    EKG None  Radiology No results found.  Procedures Procedures (including critical care time)  Medications Ordered in ED Medications  carvedilol (COREG) tablet 12.5 mg (12.5 mg Oral Given 01/16/19 1328)     Initial Impression / Assessment and Plan / ED Course  I have reviewed the triage vital signs and the nursing notes.  Pertinent labs & imaging results that were available during my care of the patient were reviewed by me and considered in my medical decision making (see chart for details).        85 year old female with past medical history documented above presents to the ER after being referred by her cardiologist for an elevated potassium level yesterday.  Patient denies any specific complaints to this.  As she reports chronic shortness of breath that  has improved with  albuterol rescue inhaler at nights and this is being discussed by her primary care doctor.  Patient reports a mild nonproductive cough.  Denies any fevers or chills.  Denies any leg swelling.  On exam patient is very well-appearing.  Vital signs are reassuring.  She is mildly hypertensive however she did not take her blood pressure medicine this morning and she has not taken her losartan at night as she was told to hold this as this could be contributing to her potassium increase.  Patient was not hypoxic and no tachypnea noted.  No signs of fluid overload on my exam today.  Lab works shows improving potassium of 5.2 from 6.1 yesterday.  No other significant electrolyte derangement.  Normal kidney function.  Normal hemoglobin.  Patient declined chest x-ray.  I did order this given patient's mild wheezing and shortness of breath rule out any pneumonia however she does not want this.  Given that she is not febrile have low suspicion for pneumonia at this time.  Given only a slight increase potassium of 5.2 doubt any EKG changes and do not feel patient needs emergent EKG at this time.  I discussed with patient that she will need to continue holding her losartan until she talks to her cardiologist to see they want to discontinue this.  She will need repeat potassium in the week.  Discussed if she develops any worsening symptoms she should return to the ER.  In terms of patient's shortness of breath this seems to be chronic and being worked up by her primary care doctor.  And given some mild wheezing she does have a history of asthma this may be secondary to her asthma she also believes it may be secondary to her pets at the house.  Doubt ACS, PE, dissection, myocarditis, pericarditis, pneumothorax.  Patient was also discussed and seen by my attending Dr. Regenia Skeeter who was agreeable above plan.  Pt is hemodynamically stable, in NAD, & able to ambulate in the ED. Evaluation does not show pathology that would require  ongoing emergent intervention or inpatient treatment. I explained the diagnosis to the patient. Pain has been managed & has no complaints prior to dc. Pt is comfortable with above plan and is stable for discharge at this time. All questions were answered prior to disposition. Strict return precautions for f/u to the ED were discussed. Encouraged follow up with PCP.   Final Clinical Impressions(s) / ED Diagnoses   Final diagnoses:  Hyperkalemia    ED Discharge Orders    None       Aaron Edelman 01/16/19 1531    Sherwood Gambler, MD 01/16/19 [phone]

## 2019-01-16 NOTE — Discharge Instructions (Addendum)
Your potassium its still mildly elevated today however decreased from yesterday.  We will continue to hold your losartan.  Please discuss with your cardiologist when they want you to restart this medication.  Need to have your potassium rechecked at the end of the week or beginning of next week.  If you develop any worsening symptoms return to the ER.  Make sure you still continue taking her blood pressure medicine besides the losartan at this evening for your blood pressure control.

## 2019-02-01 DIAGNOSIS — M159 Polyosteoarthritis, unspecified: Secondary | ICD-10-CM | POA: Diagnosis not present

## 2019-02-01 DIAGNOSIS — G620 Drug-induced polyneuropathy: Secondary | ICD-10-CM | POA: Diagnosis not present

## 2019-02-01 DIAGNOSIS — R2681 Unsteadiness on feet: Secondary | ICD-10-CM | POA: Diagnosis not present

## 2019-02-01 DIAGNOSIS — Z23 Encounter for immunization: Secondary | ICD-10-CM | POA: Diagnosis not present

## 2019-02-01 DIAGNOSIS — G894 Chronic pain syndrome: Secondary | ICD-10-CM | POA: Diagnosis not present

## 2019-02-01 DIAGNOSIS — I10 Essential (primary) hypertension: Secondary | ICD-10-CM | POA: Diagnosis not present

## 2019-02-05 ENCOUNTER — Ambulatory Visit (INDEPENDENT_AMBULATORY_CARE_PROVIDER_SITE_OTHER): Payer: Medicare Other | Admitting: *Deleted

## 2019-02-05 DIAGNOSIS — I5032 Chronic diastolic (congestive) heart failure: Secondary | ICD-10-CM

## 2019-02-05 DIAGNOSIS — I428 Other cardiomyopathies: Secondary | ICD-10-CM

## 2019-02-06 LAB — CUP PACEART REMOTE DEVICE CHECK
Battery Remaining Longevity: 5 mo
Battery Voltage: 2.81 V
Brady Statistic AP VP Percent: 4 %
Brady Statistic AP VS Percent: 0.07 %
Brady Statistic AS VP Percent: 94.59 %
Brady Statistic AS VS Percent: 1.34 %
Brady Statistic RA Percent Paced: 4.06 %
Brady Statistic RV Percent Paced: 98.07 %
Date Time Interrogation Session: 20201109062303
HighPow Impedance: 49 Ohm
HighPow Impedance: 61 Ohm
Implantable Lead Implant Date: 20090331
Implantable Lead Implant Date: 20090331
Implantable Lead Implant Date: 20090401
Implantable Lead Location: 753858
Implantable Lead Location: 753859
Implantable Lead Location: 753860
Implantable Lead Model: 4194
Implantable Lead Model: 5076
Implantable Lead Model: 6947
Implantable Pulse Generator Implant Date: 20150505
Lead Channel Impedance Value: 342 Ohm
Lead Channel Impedance Value: 361 Ohm
Lead Channel Impedance Value: 418 Ohm
Lead Channel Impedance Value: 475 Ohm
Lead Channel Impedance Value: 513 Ohm
Lead Channel Impedance Value: 665 Ohm
Lead Channel Pacing Threshold Amplitude: 0.875 V
Lead Channel Pacing Threshold Amplitude: 1.5 V
Lead Channel Pacing Threshold Amplitude: 1.625 V
Lead Channel Pacing Threshold Pulse Width: 0.4 ms
Lead Channel Pacing Threshold Pulse Width: 0.4 ms
Lead Channel Pacing Threshold Pulse Width: 0.4 ms
Lead Channel Sensing Intrinsic Amplitude: 1.5 mV
Lead Channel Sensing Intrinsic Amplitude: 1.5 mV
Lead Channel Sensing Intrinsic Amplitude: 18 mV
Lead Channel Sensing Intrinsic Amplitude: 18 mV
Lead Channel Setting Pacing Amplitude: 2 V
Lead Channel Setting Pacing Amplitude: 2.5 V
Lead Channel Setting Pacing Amplitude: 3.25 V
Lead Channel Setting Pacing Pulse Width: 0.4 ms
Lead Channel Setting Pacing Pulse Width: 0.4 ms
Lead Channel Setting Sensing Sensitivity: 0.3 mV

## 2019-02-06 NOTE — Progress Notes (Signed)
Virtual Visit via Telephone Note   This visit type was conducted due to national recommendations for restrictions regarding the COVID-19 Pandemic (e.g. social distancing) in an effort to limit this patient's exposure and mitigate transmission in our community.  Due to her co-morbid illnesses, this patient is at least at moderate risk for complications without adequate follow up.  This format is felt to be most appropriate for this patient at this time.  The patient did not have access to video technology/had technical difficulties with video requiring transitioning to audio format only (telephone).  All issues noted in this document were discussed and addressed.  No physical exam could be performed with this format.  Please refer to the patient's chart for her  consent to telehealth for [facility].   Date:  02/08/2019   ID:  [name], DOB 30-Aug-1928, MRN [id number]  Patient Location: Home Provider Location: Home  PCP:  Patient, No Pcp Per  Cardiologist:  Virl Axe, MD  Electrophysiologist:  Sanda Klein, MD   Evaluation Performed:  Follow-Up Visit  Chief Complaint:  DOE  History of Present Illness:    [name] is a 85 y.o. female with NICM s/p BiV ICD/CRT-D (05/2007, gen change 2015), chronic diastolic heart failure, EF has been as low as 20%, now normalized since synchronized pacing, paroxysmal atrial flutter (one brief occurrence, no AC), COPD, normal coronaries by heart cath in 1992, and hypertension. Normal myoview 05/2007. She is maintained on 20 mg lasix daily. Losartan was discontinued in 12/2018 due to hyperkalemia with K of 6.1. She has a history of chronic dyspnea.  She presents today for routine follow up.  She denies chest pain She reports DOE, but normal breathing at rest. This is not new, but states her breathing is getting worse. She currently sees a PA at [facility] for primary care needs. She is unsure who prescribes her inhaler. Ruthe Mannan is listed PRN but she is  taking it 3-5 times daily. I asked her to make an appt with the PA at [facility] to increase her inhaler regimen. It sounds as though her DOE is worsening secondary to COPD.  She denies orthopnea and lower extremity swelling. She is currently not taking coreg because they are waiting on a check to be able to pay for the prescription. Her BP is elevated today.   The patient does not have symptoms concerning for COVID-19 infection (fever, chills, cough, or new shortness of breath).    Past Medical History:  Diagnosis Date  . AICD (automatic cardioverter/defibrillator) present 06/27/2007   medtronic  . ARF (acute renal failure) ([place]) 09/2017  . Cancer ([place])    ovarian--stage 4  . Cardiomyopathy ([place])   . CHF (congestive heart failure) ([place])   . Dyspnea   . Peripheral neuropathy   . Systemic hypertension   . Thyroid disease    Past Surgical History:  Procedure Laterality Date  . ABDOMINAL HYSTERECTOMY  oct 2000   along with ovarian ca surg  . APPENDECTOMY    . CARDIAC CATHETERIZATION  09/27/1990   normal coronaries,dilated cardiomyopathy  . CARDIAC DEFIBRILLATOR PLACEMENT  06/27/2007   medtronic concerto  . CHOLECYSTECTOMY    . IMPLANTABLE CARDIOVERTER DEFIBRILLATOR GENERATOR CHANGE N/A 07/31/2013   Procedure: IMPLANTABLE CARDIOVERTER DEFIBRILLATOR GENERATOR CHANGE;  Surgeon: Sanda Klein, MD;  Location: [facility] CATH LAB;  Service: Cardiovascular;  Laterality: N/A;  . NM MYOCAR PERF WALL MOTION  06/05/2007   no ischemia     Current Meds  Medication Sig  .  alendronate (FOSAMAX) 70 MG tablet Take 70 mg by mouth every Sunday.   Marland Kitchen amLODipine (NORVASC) 5 MG tablet TAKE 1 TABLET BY MOUTH EVERY DAY  . aspirin EC 81 MG tablet Take 1 tablet (81 mg total) by mouth daily.  . Calcium Carbonate-Vitamin D 600-400 MG-UNIT chew tablet Chew 2 tablets by mouth daily.   . carvedilol (COREG) 12.5 MG tablet TAKE 1 TABLET BY MOUTH 2 TIMES DAILY WITH A MEAL. PLEASE MAKE APPT WITH DR. [place]  . co-enzyme  Q-10 50 MG capsule Take 50 mg by mouth daily. Reported on 09/29/2015  . DULERA 200-5 MCG/ACT AERO Inhale 2 puffs into the lungs daily as needed for wheezing or shortness of breath.   . DULoxetine (CYMBALTA) 60 MG capsule Take 60 mg by mouth daily.   . furosemide (LASIX) 20 MG tablet TAKE 1 TABLET BY MOUTH EVERY DAY  . gabapentin (NEURONTIN) 100 MG capsule Take 100-300 mg by mouth at bedtime as needed (pain).  Marland Kitchen liothyronine (CYTOMEL) 25 MCG tablet Take 25 mcg by mouth daily.  . Melatonin 10 MG TABS Take 10 mg by mouth at bedtime as needed (sleep).  Marland Kitchen oxyCODONE (ROXICODONE) 15 MG immediate release tablet Take 15 mg by mouth every 6 (six) hours as needed for pain.   Marland Kitchen SYNTHROID 112 MCG tablet Take 112 mcg by mouth daily before breakfast.      Allergies:   Patient has no known allergies.   Social History   Tobacco Use  . Smoking status: Never Smoker  . Smokeless tobacco: Never Used  Substance Use Topics  . Alcohol use: No  . Drug use: No     Family Hx: The patient's family history includes Heart attack in her father; Heart disease in her paternal grandmother; Lung cancer in her father and mother.  ROS:   Please see the history of present illness.     All other systems reviewed and are negative.   Prior CV studies:   The following studies were reviewed today:  Echo 10/18/17: Study Conclusions  - Left ventricle: The cavity size was normal. Wall thickness was   increased in a pattern of moderate LVH. Systolic function was   normal. The estimated ejection fraction was in the range of 55%   to 60%. Wall motion was normal; there were no regional wall   motion abnormalities. Doppler parameters are consistent with   abnormal left ventricular relaxation (grade 1 diastolic   dysfunction). - Aortic valve: There was mild regurgitation. Valve area (VTI):   2.31 cm^2. Valve area (Vmax): 2.07 cm^2. Valve area (Vmean): 2.26   cm^2. - Mitral valve: Calcified annulus. Valve area by pressure    half-time: 2.18 cm^2. Valve area by continuity equation (using   LVOT flow): 1.33 cm^2. - Left atrium: The atrium was moderately dilated.  Impressions:  - Normal LVEF.   Moderate LVH.   Moderate LAE.   MIld MS.   Trace AR.  Labs/Other Tests and Data Reviewed:    EKG:  No ECG reviewed.  Recent Labs: 01/16/2019: ALT 11; BUN 22; Creatinine, Ser 0.81; Hemoglobin 12.4; Platelets 185; Potassium 5.2; Sodium 132   Recent Lipid Panel Lab Results  Component Value Date/Time   CHOL 135 09/30/2015 09:46 AM   TRIG 113 09/30/2015 09:46 AM   HDL 32 (L) 09/30/2015 09:46 AM   CHOLHDL 4.2 09/30/2015 09:46 AM   LDLCALC 80 09/30/2015 09:46 AM   LDLDIRECT 111 (H) 03/06/2013 10:54 AM    Wt Readings from Last  3 Encounters:  01/16/19 133 lb (60.3 kg)  01/15/19 145 lb (65.8 kg)  04/06/18 140 lb 9.6 oz (63.8 kg)     Objective:    Vital Signs:  BP (!) 144/84   Pulse 100   Ht 5\' 1"  (1.549 m)   BMI 25.13 kg/m    VITAL SIGNS:  reviewed GEN:  no acute distress RESPIRATORY:  respirations unlabored NEURO:  alert and oriented x 3, no obvious focal deficit PSYCH:  normal affect  ASSESSMENT & PLAN:    NICM s/p BiV ICD Chronic diastolic heart failure - normal EF - maintained on 20 mg lasix daily - ASA, coreg - losartan D/C'ed for hyperkalemia - once her breathing is stable, would favor a follow up to explore optimal heart failure medications in the absence of losartan - I will route this note to Dr. Sallyanne Kuster to inquire about her optivol reading on her recent remote device check on 02/05/19   DOE COPD DOE sounds secondary to COPD. It improves with inhaler use. I have asked her to follow up with her PCP to increase her inhaler regimen. If this does not improve her breathing, would favor repeat echocardiogram to evaluate EF. She denies lower extremity swelling and orthopnea. Optivol reading as above.   Hypertension - medications include norvasc 5 mg and coreg 12.5 BID - losartan D/C'ed  for hyperkalemia - she hasn't taken coreg for the past couple of days because they are waiting for a check to pay for the prescription - pressure is elevated today - we discussed increasing norvasc to 10 mg, but I will hold off since she hasn't been taking coreg    COVID-19 Education: The signs and symptoms of COVID-19 were discussed with the patient and how to seek care for testing (follow up with PCP or arrange E-visit).  The importance of social distancing was discussed today.  Time:   Today, I have spent 23 minutes with the patient with telehealth technology discussing the above problems.     Medication Adjustments/Labs and Tests Ordered: Current medicines are reviewed at length with the patient today.  Concerns regarding medicines are outlined above.   Tests Ordered: No orders of the defined types were placed in this encounter.   Medication Changes: No orders of the defined types were placed in this encounter.   Follow Up:  In Person in 1 month(s)  Signed, Ledora Bottcher, [place]  02/08/2019 10:12 AM    [place]

## 2019-02-07 ENCOUNTER — Encounter: Payer: Self-pay | Admitting: Physician Assistant

## 2019-02-07 ENCOUNTER — Telehealth (INDEPENDENT_AMBULATORY_CARE_PROVIDER_SITE_OTHER): Payer: Medicare Other | Admitting: Physician Assistant

## 2019-02-07 ENCOUNTER — Telehealth: Payer: Self-pay | Admitting: Physician Assistant

## 2019-02-07 VITALS — BP 144/84 | HR 100 | Ht 61.0 in

## 2019-02-07 DIAGNOSIS — J449 Chronic obstructive pulmonary disease, unspecified: Secondary | ICD-10-CM

## 2019-02-07 DIAGNOSIS — I5032 Chronic diastolic (congestive) heart failure: Secondary | ICD-10-CM | POA: Diagnosis not present

## 2019-02-07 DIAGNOSIS — R0600 Dyspnea, unspecified: Secondary | ICD-10-CM

## 2019-02-07 DIAGNOSIS — I428 Other cardiomyopathies: Secondary | ICD-10-CM | POA: Diagnosis not present

## 2019-02-07 DIAGNOSIS — R0609 Other forms of dyspnea: Secondary | ICD-10-CM

## 2019-02-07 DIAGNOSIS — I10 Essential (primary) hypertension: Secondary | ICD-10-CM | POA: Diagnosis not present

## 2019-02-07 DIAGNOSIS — Z95810 Presence of automatic (implantable) cardiac defibrillator: Secondary | ICD-10-CM | POA: Diagnosis not present

## 2019-02-07 NOTE — Patient Instructions (Addendum)
Medication Instructions:   Your physician recommends that you continue on your current medications as directed. Please refer to the Current Medication list given to you today.   *If you need a refill on your cardiac medications before your next appointment, please call your pharmacy*   Follow-Up: At [facility], you and your health needs are our priority.  As part of our continuing mission to provide you with exceptional heart care, we have created designated Provider Care Teams.  These Care Teams include your primary Cardiologist (physician) and Advanced Practice Providers (APPs -  Physician Assistants and Nurse Practitioners) who all work together to provide you with the care you need, when you need it.  Your next appointment:   December 17th at 2:00PM (Thursday)  The format for your next appointment:   In Person  Provider:   Fabian Sharp, [place]

## 2019-02-07 NOTE — Telephone Encounter (Signed)
New Message     [name] is calling and says she is unable to take the pts BP at home and says she will take her to have it check and she will call back to leave the readings  [name] also asks for the telephone call for her appt today be made to her phone [phone]

## 2019-02-08 NOTE — Progress Notes (Signed)
Optivol does not suggest CHF exacerbation. Should not use Dulera more than prescribed dose.  If the dyspnea is due to asthma, it may worsen w carvedilol. If so, we can switch to bisoprolol, more beta-1 selective.

## 2019-02-19 ENCOUNTER — Ambulatory Visit: Payer: Medicare Other | Admitting: Physician Assistant

## 2019-03-05 NOTE — Progress Notes (Signed)
Remote ICD transmission.   

## 2019-03-08 NOTE — Progress Notes (Deleted)
Cardiology Office Note:    Date:  03/08/2019   ID:  [name], DOB 10/12/1928, MRN [id number]  PCP:  Patient, No Pcp Per  Cardiologist:  Virl Axe, MD   Referring MD: No ref. provider found   No chief complaint on file. ***  History of Present Illness:    [name] is a 85 y.o. female with NICM s/p BiV ICD/CRT-D (05/2007, gen change 2015), chronic diastolic heart failure, EF has been as low as 20%, now normalized since synchronized pacing, paroxysmal atrial flutter (one brief occurrence, no AC), COPD, normal coronaries by heart cath in 1992, and hypertension. Normal myoview 05/2007. She is maintained on 20 mg lasix daily. Losartan was discontinued in 12/2018 due to hyperkalemia with K of 6.1. She has a history of chronic dyspnea. I saw her via telehealth visit on 02/07/19. She reported DOE and was using her PRN inhaler several times daily. I advised that she needed to see PCP to adjust her COPD regimen. She was also not taking coreg bc she was waiting on a check to pick up her medication.   She presents today for follow up of her DOE and BP.     NICM s/p BiV ICD Chronic diastolic heart failure  - normal EF on echo 2019 - continue ASA and coreg - if more DOE, will switch to bisoprolol - no losartan for hyperkalemia   DOE COPD - improves with inhaler use - suspect DOE is related to COPD - optivol did not indicate hypervolemia - inhalers adjusted to ___   Hypertension - norvasc 5 mg and coreg 12.5 mg BID - no losartan as above - pressures were increased during our last visit but she had not been taking her coreg       Past Medical History:  Diagnosis Date  . AICD (automatic cardioverter/defibrillator) present 06/27/2007   medtronic  . ARF (acute renal failure) ([place]) 09/2017  . Cancer ([place])    ovarian--stage 4  . Cardiomyopathy ([place])   . CHF (congestive heart failure) ([place])   . Dyspnea   . Peripheral neuropathy   . Systemic hypertension   . Thyroid  disease     Past Surgical History:  Procedure Laterality Date  . ABDOMINAL HYSTERECTOMY  oct 2000   along with ovarian ca surg  . APPENDECTOMY    . CARDIAC CATHETERIZATION  09/27/1990   normal coronaries,dilated cardiomyopathy  . CARDIAC DEFIBRILLATOR PLACEMENT  06/27/2007   medtronic concerto  . CHOLECYSTECTOMY    . IMPLANTABLE CARDIOVERTER DEFIBRILLATOR GENERATOR CHANGE N/A 07/31/2013   Procedure: IMPLANTABLE CARDIOVERTER DEFIBRILLATOR GENERATOR CHANGE;  Surgeon: Sanda Klein, MD;  Location: [facility] CATH [facility];  Service: Cardiovascular;  Laterality: N/A;  . NM MYOCAR PERF WALL MOTION  06/05/2007   no ischemia    Current Medications: No outpatient medications have been marked as taking for the 03/15/19 encounter (Appointment) with Ledora Bottcher, [place].     Allergies:   Patient has no known allergies.   Social History   Socioeconomic History  . Marital status: Widowed    Spouse name: Not on file  . Number of children: Not on file  . Years of education: Not on file  . Highest education level: Not on file  Occupational History  . Occupation: housewife  Tobacco Use  . Smoking status: Never Smoker  . Smokeless tobacco: Never Used  Substance and Sexual Activity  . Alcohol use: No  . Drug use: No  . Sexual activity: Not on file  Other Topics  Concern  . Not on file  Social History Narrative  . Not on file   Social Determinants of Health   Financial Resource Strain:   . Difficulty of Paying Living Expenses: Not on file  Food Insecurity:   . Worried About Charity fundraiser in the Last Year: Not on file  . Ran Out of Food in the Last Year: Not on file  Transportation Needs:   . Lack of Transportation (Medical): Not on file  . Lack of Transportation (Non-Medical): Not on file  Physical Activity:   . Days of Exercise per Week: Not on file  . Minutes of Exercise per Session: Not on file  Stress:   . Feeling of Stress : Not on file  Social Connections:   . Frequency of  Communication with Friends and Family: Not on file  . Frequency of Social Gatherings with Friends and Family: Not on file  . Attends Religious Services: Not on file  . Active Member of Clubs or Organizations: Not on file  . Attends Archivist Meetings: Not on file  . Marital Status: Not on file     Family History: The patient's ***family history includes Heart attack in her father; Heart disease in her paternal grandmother; Lung cancer in her father and mother.  ROS:   Please see the history of present illness.    *** All other systems reviewed and are negative.  EKGs/Labs/Other Studies Reviewed:    The following studies were reviewed today: ***  EKG:  EKG is *** ordered today.  The ekg ordered today demonstrates ***  Recent Labs: 01/16/2019: ALT 11; BUN 22; Creatinine, Ser 0.81; Hemoglobin 12.4; Platelets 185; Potassium 5.2; Sodium 132  Recent Lipid Panel    Component Value Date/Time   CHOL 135 09/30/2015 0946   TRIG 113 09/30/2015 0946   HDL 32 (L) 09/30/2015 0946   CHOLHDL 4.2 09/30/2015 0946   VLDL 23 09/30/2015 0946   LDLCALC 80 09/30/2015 0946   LDLDIRECT 111 (H) 03/06/2013 1054    Physical Exam:    VS:  There were no vitals taken for this visit.    Wt Readings from Last 3 Encounters:  01/16/19 133 lb (60.3 kg)  01/15/19 145 lb (65.8 kg)  04/06/18 140 lb 9.6 oz (63.8 kg)     GEN: *** Well nourished, well developed in no acute distress HEENT: Normal NECK: No JVD; No carotid bruits LYMPHATICS: No lymphadenopathy CARDIAC: ***RRR, no murmurs, rubs, gallops RESPIRATORY:  Clear to auscultation without rales, wheezing or rhonchi  ABDOMEN: Soft, non-tender, non-distended MUSCULOSKELETAL:  No edema; No deformity  SKIN: Warm and dry NEUROLOGIC:  Alert and oriented x 3 PSYCHIATRIC:  Normal affect   ASSESSMENT:    No diagnosis found. PLAN:    In order of problems listed above:  No diagnosis found.   Medication Adjustments/Labs and Tests Ordered:  Current medicines are reviewed at length with the patient today.  Concerns regarding medicines are outlined above.  No orders of the defined types were placed in this encounter.  No orders of the defined types were placed in this encounter.   Signed, Ledora Bottcher, [place]  03/08/2019 8:33 AM    [facility]

## 2019-03-12 ENCOUNTER — Ambulatory Visit: Payer: Medicare Other

## 2019-03-14 ENCOUNTER — Emergency Department (HOSPITAL_COMMUNITY): Payer: Medicare Other

## 2019-03-14 ENCOUNTER — Other Ambulatory Visit: Payer: Self-pay

## 2019-03-14 ENCOUNTER — Inpatient Hospital Stay (HOSPITAL_COMMUNITY)
Admission: EM | Admit: 2019-03-14 | Discharge: 2019-03-18 | DRG: 190 | Disposition: A | Discharge status: Home Health | Payer: Medicare Other | Attending: Internal Medicine | Admitting: Internal Medicine

## 2019-03-14 DIAGNOSIS — Z7983 Long term (current) use of bisphosphonates: Secondary | ICD-10-CM

## 2019-03-14 DIAGNOSIS — E031 Congenital hypothyroidism without goiter: Secondary | ICD-10-CM | POA: Diagnosis present

## 2019-03-14 DIAGNOSIS — J441 Chronic obstructive pulmonary disease with (acute) exacerbation: Principal | ICD-10-CM | POA: Diagnosis present

## 2019-03-14 DIAGNOSIS — J45901 Unspecified asthma with (acute) exacerbation: Secondary | ICD-10-CM | POA: Diagnosis present

## 2019-03-14 DIAGNOSIS — Z8249 Family history of ischemic heart disease and other diseases of the circulatory system: Secondary | ICD-10-CM

## 2019-03-14 DIAGNOSIS — Z7951 Long term (current) use of inhaled steroids: Secondary | ICD-10-CM

## 2019-03-14 DIAGNOSIS — R42 Dizziness and giddiness: Secondary | ICD-10-CM | POA: Diagnosis not present

## 2019-03-14 DIAGNOSIS — Z20828 Contact with and (suspected) exposure to other viral communicable diseases: Secondary | ICD-10-CM | POA: Diagnosis present

## 2019-03-14 DIAGNOSIS — J9600 Acute respiratory failure, unspecified whether with hypoxia or hypercapnia: Secondary | ICD-10-CM | POA: Diagnosis present

## 2019-03-14 DIAGNOSIS — I6782 Cerebral ischemia: Secondary | ICD-10-CM | POA: Diagnosis present

## 2019-03-14 DIAGNOSIS — Z7982 Long term (current) use of aspirin: Secondary | ICD-10-CM

## 2019-03-14 DIAGNOSIS — J189 Pneumonia, unspecified organism: Secondary | ICD-10-CM | POA: Diagnosis present

## 2019-03-14 DIAGNOSIS — T451X5D Adverse effect of antineoplastic and immunosuppressive drugs, subsequent encounter: Secondary | ICD-10-CM

## 2019-03-14 DIAGNOSIS — R4020 Unspecified coma: Secondary | ICD-10-CM | POA: Diagnosis not present

## 2019-03-14 DIAGNOSIS — E039 Hypothyroidism, unspecified: Secondary | ICD-10-CM | POA: Diagnosis present

## 2019-03-14 DIAGNOSIS — J440 Chronic obstructive pulmonary disease with acute lower respiratory infection: Secondary | ICD-10-CM | POA: Diagnosis present

## 2019-03-14 DIAGNOSIS — Z993 Dependence on wheelchair: Secondary | ICD-10-CM

## 2019-03-14 DIAGNOSIS — M1990 Unspecified osteoarthritis, unspecified site: Secondary | ICD-10-CM | POA: Diagnosis present

## 2019-03-14 DIAGNOSIS — I4892 Unspecified atrial flutter: Secondary | ICD-10-CM | POA: Diagnosis present

## 2019-03-14 DIAGNOSIS — I428 Other cardiomyopathies: Secondary | ICD-10-CM | POA: Diagnosis present

## 2019-03-14 DIAGNOSIS — Z801 Family history of malignant neoplasm of trachea, bronchus and lung: Secondary | ICD-10-CM

## 2019-03-14 DIAGNOSIS — Z95810 Presence of automatic (implantable) cardiac defibrillator: Secondary | ICD-10-CM | POA: Diagnosis not present

## 2019-03-14 DIAGNOSIS — Z9049 Acquired absence of other specified parts of digestive tract: Secondary | ICD-10-CM | POA: Diagnosis not present

## 2019-03-14 DIAGNOSIS — Z79890 Hormone replacement therapy (postmenopausal): Secondary | ICD-10-CM

## 2019-03-14 DIAGNOSIS — Z90710 Acquired absence of both cervix and uterus: Secondary | ICD-10-CM

## 2019-03-14 DIAGNOSIS — R531 Weakness: Secondary | ICD-10-CM | POA: Diagnosis not present

## 2019-03-14 DIAGNOSIS — G894 Chronic pain syndrome: Secondary | ICD-10-CM | POA: Diagnosis present

## 2019-03-14 DIAGNOSIS — Z79891 Long term (current) use of opiate analgesic: Secondary | ICD-10-CM

## 2019-03-14 DIAGNOSIS — G620 Drug-induced polyneuropathy: Secondary | ICD-10-CM | POA: Diagnosis present

## 2019-03-14 DIAGNOSIS — I5042 Chronic combined systolic (congestive) and diastolic (congestive) heart failure: Secondary | ICD-10-CM | POA: Diagnosis present

## 2019-03-14 DIAGNOSIS — Z66 Do not resuscitate: Secondary | ICD-10-CM | POA: Diagnosis present

## 2019-03-14 DIAGNOSIS — R0602 Shortness of breath: Secondary | ICD-10-CM | POA: Diagnosis present

## 2019-03-14 DIAGNOSIS — R0600 Dyspnea, unspecified: Secondary | ICD-10-CM | POA: Diagnosis not present

## 2019-03-14 DIAGNOSIS — J9601 Acute respiratory failure with hypoxia: Secondary | ICD-10-CM | POA: Insufficient documentation

## 2019-03-14 DIAGNOSIS — Z8543 Personal history of malignant neoplasm of ovary: Secondary | ICD-10-CM

## 2019-03-14 DIAGNOSIS — R0902 Hypoxemia: Secondary | ICD-10-CM | POA: Diagnosis not present

## 2019-03-14 DIAGNOSIS — J449 Chronic obstructive pulmonary disease, unspecified: Secondary | ICD-10-CM

## 2019-03-14 DIAGNOSIS — Z9981 Dependence on supplemental oxygen: Secondary | ICD-10-CM

## 2019-03-14 DIAGNOSIS — I951 Orthostatic hypotension: Secondary | ICD-10-CM | POA: Diagnosis present

## 2019-03-14 DIAGNOSIS — I480 Paroxysmal atrial fibrillation: Secondary | ICD-10-CM | POA: Diagnosis present

## 2019-03-14 DIAGNOSIS — I110 Hypertensive heart disease with heart failure: Secondary | ICD-10-CM | POA: Diagnosis present

## 2019-03-14 DIAGNOSIS — Z79899 Other long term (current) drug therapy: Secondary | ICD-10-CM

## 2019-03-14 DIAGNOSIS — I5032 Chronic diastolic (congestive) heart failure: Secondary | ICD-10-CM | POA: Diagnosis present

## 2019-03-14 DIAGNOSIS — Z9221 Personal history of antineoplastic chemotherapy: Secondary | ICD-10-CM

## 2019-03-14 LAB — CBC
HCT: 42.8 % (ref 36.0–46.0)
Hemoglobin: 13.5 g/dL (ref 12.0–15.0)
MCH: 30.8 pg (ref 26.0–34.0)
MCHC: 31.5 g/dL (ref 30.0–36.0)
MCV: 97.5 fL (ref 80.0–100.0)
Platelets: 253 10*3/uL (ref 150–400)
RBC: 4.39 MIL/uL (ref 3.87–5.11)
RDW: 13.1 % (ref 11.5–15.5)
WBC: 7.3 10*3/uL (ref 4.0–10.5)
nRBC: 0 % (ref 0.0–0.2)

## 2019-03-14 LAB — COMPREHENSIVE METABOLIC PANEL
ALT: 21 U/L (ref 0–44)
AST: 25 U/L (ref 15–41)
Albumin: 3.4 g/dL — ABNORMAL LOW (ref 3.5–5.0)
Alkaline Phosphatase: 109 U/L (ref 38–126)
Anion gap: 11 (ref 5–15)
BUN: 14 mg/dL (ref 8–23)
CO2: 30 mmol/L (ref 22–32)
Calcium: 9 mg/dL (ref 8.9–10.3)
Chloride: 93 mmol/L — ABNORMAL LOW (ref 98–111)
Creatinine, Ser: 0.71 mg/dL (ref 0.44–1.00)
GFR calc Af Amer: >60 mL/min (ref >60)
GFR calc non Af Amer: >60 mL/min (ref >60)
Glucose, Bld: 105 mg/dL — ABNORMAL HIGH (ref 70–99)
Potassium: 4.2 mmol/L (ref 3.5–5.1)
Sodium: 134 mmol/L — ABNORMAL LOW (ref 135–145)
Total Bilirubin: 0.7 mg/dL (ref 0.3–1.2)
Total Protein: 7.5 g/dL (ref 6.5–8.1)

## 2019-03-14 LAB — URINALYSIS, ROUTINE W REFLEX MICROSCOPIC
Bilirubin Urine: NEGATIVE
Glucose, UA: NEGATIVE mg/dL
Hgb urine dipstick: NEGATIVE
Ketones, ur: NEGATIVE mg/dL
Nitrite: NEGATIVE
Protein, ur: NEGATIVE mg/dL
Specific Gravity, Urine: 1.008 (ref 1.005–1.030)
pH: 7 (ref 5.0–8.0)

## 2019-03-14 LAB — DIFFERENTIAL
Abs Immature Granulocytes: 0.01 10*3/uL (ref 0.00–0.07)
Basophils Absolute: 0.1 10*3/uL (ref 0.0–0.1)
Basophils Relative: 1 %
Eosinophils Absolute: 0.3 10*3/uL (ref 0.0–0.5)
Eosinophils Relative: 5 %
Immature Granulocytes: 0 %
Lymphocytes Relative: 14 %
Lymphs Abs: 1 10*3/uL (ref 0.7–4.0)
Monocytes Absolute: 0.7 10*3/uL (ref 0.1–1.0)
Monocytes Relative: 9 %
Neutro Abs: 5.2 10*3/uL (ref 1.7–7.7)
Neutrophils Relative %: 71 %

## 2019-03-14 LAB — RAPID URINE DRUG SCREEN, HOSP PERFORMED
Amphetamines: NOT DETECTED
Barbiturates: NOT DETECTED
Benzodiazepines: NOT DETECTED
Cocaine: NOT DETECTED
Opiates: NOT DETECTED
Tetrahydrocannabinol: NOT DETECTED

## 2019-03-14 LAB — APTT: aPTT: 27 seconds (ref 24–36)

## 2019-03-14 LAB — ETHANOL: Alcohol, Ethyl (B): <10 mg/dL (ref <10)

## 2019-03-14 LAB — PROTIME-INR
INR: 0.9 (ref 0.8–1.2)
Prothrombin Time: 12 seconds (ref 11.4–15.2)

## 2019-03-14 LAB — POC SARS CORONAVIRUS 2 AG -  ED: SARS Coronavirus 2 Ag: NEGATIVE

## 2019-03-14 MED ORDER — OXYCODONE HCL ER 15 MG PO T12A
15.0000 mg | EXTENDED_RELEASE_TABLET | Freq: Two times a day (BID) | ORAL | Status: DC
  Administered 2019-03-14: 15 mg via ORAL
  Filled 2019-03-14: qty 1

## 2019-03-14 MED ORDER — ASPIRIN EC 81 MG PO TBEC
81.0000 mg | DELAYED_RELEASE_TABLET | Freq: Every day | ORAL | Status: DC
  Administered 2019-03-16 – 2019-03-18 (×3): 81 mg via ORAL
  Filled 2019-03-14 (×3): qty 1

## 2019-03-14 MED ORDER — AMLODIPINE BESYLATE 10 MG PO TABS
10.0000 mg | ORAL_TABLET | Freq: Every day | ORAL | Status: DC
  Administered 2019-03-15 – 2019-03-16 (×2): 10 mg via ORAL
  Filled 2019-03-14: qty 1
  Filled 2019-03-14: qty 2

## 2019-03-14 MED ORDER — ONDANSETRON HCL 4 MG PO TABS: 4.0000 mg | ORAL_TABLET | Freq: Four times a day (QID) | ORAL | Status: DC | PRN

## 2019-03-14 MED ORDER — DULOXETINE HCL 30 MG PO CPEP
60.0000 mg | ORAL_CAPSULE | Freq: Every day | ORAL | Status: DC
  Administered 2019-03-16 – 2019-03-18 (×3): 60 mg via ORAL
  Filled 2019-03-14 (×3): qty 2

## 2019-03-14 MED ORDER — IPRATROPIUM-ALBUTEROL 0.5-2.5 (3) MG/3ML IN SOLN
3.0000 mL | Freq: Four times a day (QID) | RESPIRATORY_TRACT | Status: DC
  Administered 2019-03-15: 11:00:00 3 mL via RESPIRATORY_TRACT
  Filled 2019-03-14: qty 3

## 2019-03-14 MED ORDER — ACETAMINOPHEN 325 MG PO TABS
650.0000 mg | ORAL_TABLET | Freq: Four times a day (QID) | ORAL | Status: DC | PRN
  Administered 2019-03-16: 650 mg via ORAL
  Filled 2019-03-14 (×2): qty 2

## 2019-03-14 MED ORDER — FUROSEMIDE 20 MG PO TABS
20.0000 mg | ORAL_TABLET | Freq: Every day | ORAL | Status: DC
  Administered 2019-03-15 – 2019-03-16 (×2): 20 mg via ORAL
  Filled 2019-03-14 (×2): qty 1

## 2019-03-14 MED ORDER — ALBUTEROL SULFATE HFA 108 (90 BASE) MCG/ACT IN AERS
4.0000 | INHALATION_SPRAY | Freq: Once | RESPIRATORY_TRACT | Status: AC
  Administered 2019-03-14: 21:00:00 4 via RESPIRATORY_TRACT
  Filled 2019-03-14: qty 6.7

## 2019-03-14 MED ORDER — LIOTHYRONINE SODIUM 25 MCG PO TABS
25.0000 ug | ORAL_TABLET | Freq: Every day | ORAL | Status: DC
  Administered 2019-03-15 – 2019-03-18 (×4): 25 ug via ORAL
  Filled 2019-03-14 (×4): qty 1

## 2019-03-14 MED ORDER — GUAIFENESIN ER 600 MG PO TB12
600.0000 mg | ORAL_TABLET | Freq: Two times a day (BID) | ORAL | Status: DC
  Administered 2019-03-15 – 2019-03-18 (×7): 600 mg via ORAL
  Filled 2019-03-14 (×7): qty 1

## 2019-03-14 MED ORDER — CARVEDILOL 12.5 MG PO TABS
12.5000 mg | ORAL_TABLET | Freq: Two times a day (BID) | ORAL | Status: DC
  Administered 2019-03-15 – 2019-03-16 (×4): 12.5 mg via ORAL
  Filled 2019-03-14 (×5): qty 1

## 2019-03-14 MED ORDER — ALBUTEROL SULFATE (2.5 MG/3ML) 0.083% IN NEBU: 2.5000 mg | INHALATION_SOLUTION | RESPIRATORY_TRACT | Status: DC | PRN

## 2019-03-14 MED ORDER — ONDANSETRON HCL 4 MG/2ML IJ SOLN: 4.0000 mg | Freq: Four times a day (QID) | INTRAMUSCULAR | Status: DC | PRN

## 2019-03-14 MED ORDER — ACETAMINOPHEN 650 MG RE SUPP: 650.0000 mg | Freq: Four times a day (QID) | RECTAL | Status: DC | PRN

## 2019-03-14 MED ORDER — ENOXAPARIN SODIUM 40 MG/0.4ML ~~LOC~~ SOLN
40.0000 mg | Freq: Every day | SUBCUTANEOUS | Status: DC
  Administered 2019-03-15 – 2019-03-17 (×4): 40 mg via SUBCUTANEOUS
  Filled 2019-03-14 (×4): qty 0.4

## 2019-03-14 MED ORDER — LEVOTHYROXINE SODIUM 112 MCG PO TABS
112.0000 ug | ORAL_TABLET | Freq: Every day | ORAL | Status: DC
  Administered 2019-03-16 – 2019-03-18 (×3): 112 ug via ORAL
  Filled 2019-03-14 (×4): qty 1

## 2019-03-14 MED ORDER — GABAPENTIN 100 MG PO CAPS
100.0000 mg | ORAL_CAPSULE | Freq: Every evening | ORAL | Status: DC | PRN
  Administered 2019-03-15: 22:00:00 100 mg via ORAL
  Administered 2019-03-16: 300 mg via ORAL
  Administered 2019-03-17: 100 mg via ORAL
  Filled 2019-03-14: qty 1
  Filled 2019-03-14 (×2): qty 3

## 2019-03-14 MED ORDER — AMLODIPINE BESYLATE 5 MG PO TABS: 5.0000 mg | ORAL_TABLET | Freq: Every day | ORAL | Status: DC

## 2019-03-14 MED ORDER — METHYLPREDNISOLONE SODIUM SUCC 40 MG IJ SOLR
40.0000 mg | Freq: Two times a day (BID) | INTRAMUSCULAR | Status: DC
  Administered 2019-03-15 – 2019-03-16 (×3): 40 mg via INTRAVENOUS
  Filled 2019-03-14 (×4): qty 1

## 2019-03-14 MED ORDER — METHYLPREDNISOLONE SODIUM SUCC 125 MG IJ SOLR
125.0000 mg | Freq: Once | INTRAMUSCULAR | Status: AC
  Administered 2019-03-14: 125 mg via INTRAVENOUS
  Filled 2019-03-14: qty 2

## 2019-03-14 NOTE — ED Provider Notes (Signed)
[facility] DEPT Provider Note   CSN: [id number] Arrival date & time: 03/14/19  1803     History Chief Complaint  Patient presents with  . Fatigue    [name] is a 85 y.o. female.  HPI   When pt woke up this am she noticed she was incontinent of urine.  Pt was able to get up and walk but she had to hold on to things and hold her walker.  She was feeling very dizzy and unsteady.No fevers,  She might have had some diarrhea recently but she is not sure.  No vomiting or abdominal pain.   No numbness or focal weakness in arms or legs.    Past Medical History:  Diagnosis Date  . AICD (automatic cardioverter/defibrillator) present 06/27/2007   medtronic  . ARF (acute renal failure) ([place]) 09/2017  . Cancer ([place])    ovarian--stage 4  . Cardiomyopathy ([place])   . CHF (congestive heart failure) ([place])   . Dyspnea   . Peripheral neuropathy   . Systemic hypertension   . Thyroid disease     Patient Active Problem List   Diagnosis Date Noted  . Digoxin toxicity 10/04/2017  . Closed fracture of distal end of radius 06/29/2017  . Acute encephalopathy   . AKI (acute kidney injury) ([place])   . Dysarthria 09/29/2015  . Abnormal liver function 09/29/2015  . Acute renal failure (ARF) ([place]) 09/29/2015  . Hyponatremia 09/29/2015  . Small bowel obstruction ([place]) 05/17/2015  . Hypothyroidism 05/17/2015  . Uncontrolled hypertension 05/17/2015  . Hypocalcemia 05/17/2015  . Leukocytosis 05/17/2015  . Paroxysmal atrial flutter ([place]) 04/24/2015  . Incarcerated incisional hernia 03/28/2015  . SBO (small bowel obstruction) ([place]) 03/28/2015  . Nonischemic cardiomyopathy ([place]) 11/27/2012  . Biventricular ICD  11/27/2012  . Chronic diastolic heart failure ([place]) 11/27/2012  . Peripheral neuropathy secondary to chemotherapy 11/27/2012  . Hyperlipidemia 11/27/2012  . COPD (chronic obstructive pulmonary disease) ([place]) 12/03/2011  . DOE (dyspnea on exertion) 11/05/2011     Past Surgical History:  Procedure Laterality Date  . ABDOMINAL HYSTERECTOMY  oct 2000   along with ovarian ca surg  . APPENDECTOMY    . CARDIAC CATHETERIZATION  09/27/1990   normal coronaries,dilated cardiomyopathy  . CARDIAC DEFIBRILLATOR PLACEMENT  06/27/2007   medtronic concerto  . CHOLECYSTECTOMY    . IMPLANTABLE CARDIOVERTER DEFIBRILLATOR GENERATOR CHANGE N/A 07/31/2013   Procedure: IMPLANTABLE CARDIOVERTER DEFIBRILLATOR GENERATOR CHANGE;  Surgeon: Sanda Klein, MD;  Location: [facility] CATH LAB;  Service: Cardiovascular;  Laterality: N/A;  . NM MYOCAR PERF WALL MOTION  06/05/2007   no ischemia     OB History   No obstetric history on file.     Family History  Problem Relation Age of Onset  . Lung cancer Mother        died from cancer  . Heart attack Father   . Lung cancer Father   . Heart disease Paternal Grandmother     Social History   Tobacco Use  . Smoking status: Never Smoker  . Smokeless tobacco: Never Used  Substance Use Topics  . Alcohol use: No  . Drug use: No    Home Medications Prior to Admission medications   Medication Sig Start Date End Date Taking? Authorizing Provider  alendronate (FOSAMAX) 70 MG tablet Take 70 mg by mouth every Sunday.  09/01/15   [provider]  amLODipine (NORVASC) 5 MG tablet TAKE 1 TABLET BY MOUTH EVERY DAY 08/22/18   Erlene Quan, PA-C  aspirin EC 81 MG tablet Take 1 tablet (81 mg total) by mouth daily. 10/03/17   Croitoru, Mihai, MD  Calcium Carbonate-Vitamin D 600-400 MG-UNIT chew tablet Chew 2 tablets by mouth daily.     [provider]  carvedilol (COREG) 12.5 MG tablet TAKE 1 TABLET BY MOUTH 2 TIMES DAILY WITH A MEAL. PLEASE MAKE APPT WITH DR. Caryl Comes FOR SEPTEMBER 01/12/19   Croitoru, Mihai, MD  co-enzyme Q-10 50 MG capsule Take 50 mg by mouth daily. Reported on 09/29/2015    [provider]  DULERA 200-5 MCG/ACT AERO Inhale 2 puffs into the lungs daily as needed for wheezing or shortness of breath.   09/22/15   [provider]  DULoxetine (CYMBALTA) 60 MG capsule Take 60 mg by mouth daily.  08/25/15   [provider]  furosemide (LASIX) 20 MG tablet TAKE 1 TABLET BY MOUTH EVERY DAY 10/16/18   Croitoru, Mihai, MD  gabapentin (NEURONTIN) 100 MG capsule Take 100-300 mg by mouth at bedtime as needed (pain).    [provider]  liothyronine (CYTOMEL) 25 MCG tablet Take 25 mcg by mouth daily. 07/02/15   [provider]  Melatonin 10 MG TABS Take 10 mg by mouth at bedtime as needed (sleep).    [provider]  oxyCODONE (ROXICODONE) 15 MG immediate release tablet Take 15 mg by mouth every 6 (six) hours as needed for pain.  02/28/15   [provider]  SYNTHROID 112 MCG tablet Take 112 mcg by mouth daily before breakfast.  09/12/15   [provider]    Allergies    Patient has no known allergies.  Review of Systems   Review of Systems  Respiratory: Negative for chest tightness.   Cardiovascular: Negative for chest pain.  Gastrointestinal: Negative for abdominal pain.  Genitourinary: Negative for dysuria and frequency.  Neurological: Negative for speech difficulty and headaches.  All other systems reviewed and are negative.   Physical Exam Updated Vital Signs BP (!) 167/95 (BP Location: Left Arm)   Pulse 73   Temp 98.1 F (36.7 C) (Oral)   Resp 15   Ht 1.575 m (5\' 2" )   Wt 59 kg   SpO2 96%   BMI 23.78 kg/m   Physical Exam Vitals and nursing note reviewed.  Constitutional:      General: She is not in acute distress.    Appearance: She is well-developed.  HENT:     Head: Normocephalic and atraumatic.     Right Ear: External ear normal.     Left Ear: External ear normal.  Eyes:     General: No scleral icterus.       Right eye: No discharge.        Left eye: No discharge.     Conjunctiva/sclera: Conjunctivae normal.  Neck:     Trachea: No tracheal deviation.  Cardiovascular:     Rate and Rhythm: Normal rate and regular  rhythm.  Pulmonary:     Effort: Pulmonary effort is normal. No respiratory distress.     Breath sounds: Normal breath sounds. No stridor. No wheezing or rales.  Abdominal:     General: Bowel sounds are normal. There is no distension.     Palpations: Abdomen is soft.     Tenderness: There is no abdominal tenderness. There is no guarding or rebound.  Musculoskeletal:        General: No tenderness.     Cervical back: Neck supple.  Skin:    General: Skin is warm  and dry.     Findings: No rash.  Neurological:     Mental Status: She is alert and oriented to person, place, and time.     Cranial Nerves: No cranial nerve deficit (No facial droop, extraocular movements intact, tongue midline ).     Sensory: No sensory deficit.     Motor: No abnormal muscle tone or seizure activity.     Coordination: Coordination normal.     Comments: No pronator drift bilateral upper extrem, able to hold both legs off bed for 5 seconds, sensation intact in all extremities, no visual field cuts, no left or right sided neglect, normal finger-nose exam bilaterally, no nystagmus noted      ED Results / Procedures / Treatments   Labs (all labs ordered are listed, but only abnormal results are displayed) Labs Reviewed  COMPREHENSIVE METABOLIC PANEL - Abnormal; Notable for the following components:      Result Value   Sodium 134 (*)    Chloride 93 (*)    Glucose, Bld 105 (*)    Albumin 3.4 (*)    All other components within normal limits  URINALYSIS, ROUTINE W REFLEX MICROSCOPIC - Abnormal; Notable for the following components:   Leukocytes,Ua SMALL (*)    Bacteria, UA RARE (*)    All other components within normal limits  SARS CORONAVIRUS 2 (TAT 6-24 HRS)  ETHANOL  PROTIME-INR  APTT  CBC  DIFFERENTIAL  RAPID URINE DRUG SCREEN, HOSP PERFORMED  POC SARS CORONAVIRUS 2 AG -  ED    EKG EKG Interpretation  Date/Time:  Wednesday March 14 2019 19:25:04 EST Ventricular Rate:  77 PR Interval:    QRS  Duration: 141 QT Interval:  452 QTC Calculation: 512 R Axis:   -93 Text Interpretation: Sinus rhythm Atrial premature complex Nonspecific IVCD with LAD Inferior infarct, old Anterior infarct, old No significant change since last tracing Confirmed by Dorie Rank [phone]) on 03/14/2019 7:38:25 PM   Radiology CT HEAD WO CONTRAST  Result Date: 03/14/2019 CLINICAL DATA:  Dizziness EXAM: CT HEAD WITHOUT CONTRAST TECHNIQUE: Contiguous axial images were obtained from the base of the skull through the vertex without intravenous contrast. COMPARISON:  09/30/2015 FINDINGS: Brain: There is atrophy and chronic small vessel disease changes. No acute intracranial abnormality. Specifically, no hemorrhage, hydrocephalus, mass lesion, acute infarction, or significant intracranial injury. Arachnoid cyst seen in the right middle cranial fossa, stable. Vascular: No hyperdense vessel or unexpected calcification. Skull: No acute calvarial abnormality. Sinuses/Orbits: Visualized paranasal sinuses and mastoids clear. Orbital soft tissues unremarkable. Other: None IMPRESSION: Atrophy, chronic microvascular disease. No acute intracranial abnormality. Electronically Signed   By: Rolm Baptise M.D.   On: 03/14/2019 20:06   DG Chest Portable 1 View  Result Date: 03/14/2019 CLINICAL DATA:  Dyspnea EXAM: PORTABLE CHEST 1 VIEW COMPARISON:  10/05/2017 FINDINGS: Left-sided implanted cardiac device unchanged in positioning. Mild cardiomegaly, unchanged. Aorta is calcified. Elevation of the left hemidiaphragm. Right lung is hyperexpanded. There are coarsened interstitial markings bilaterally. No focal airspace consolidation, pleural effusion, or pneumothorax. IMPRESSION: Chronic lung changes without superimposed acute cardiopulmonary process. Electronically Signed   By: Davina Poke M.D.   On: 03/14/2019 21:25   CUP PACEART REMOTE DEVICE CHECK  Result Date: 03/14/2019 Scheduled remote reviewed.  Normal device function.  Estimated  longevity 4 months Next remote 31 days.   Procedures Procedures (including critical care time)  Medications Ordered in ED Medications  oxyCODONE (OXYCONTIN) 12 hr tablet 15 mg (15 mg Oral Given 03/14/19 2156)  methylPREDNISolone  sodium succinate (SOLU-MEDROL) 125 mg/2 mL injection 125 mg (has no administration in time range)  albuterol (VENTOLIN HFA) 108 (90 Base) MCG/ACT inhaler 4 puff (4 puffs Inhalation Given 03/14/19 2127)    ED Course  I have reviewed the triage vital signs and the nursing notes.  Pertinent labs & imaging results that were available during my care of the patient were reviewed by me and considered in my medical decision making (see chart for details).  Clinical Course as of Mar 13 2228  Wed Mar 14, 2019  2033 Head CT without acute findings.  Laboratory tests show normal CBC.  No significant electrolyte abnormalities.   [JK]  2033 Initial hypertension has decreased.   [JK]  2103 Reviewed findings with pt.  Will check cxr considering oxygen requirement at home.     [id number] Patient has a persistent oxygen requirement.  When she tries to walk without oxygen she drops into the 80s.   [JK]    Clinical Course User Index [JK] Dorie Rank, MD   MDM Rules/Calculators/A&P               NIH Stroke Scale: 0       Patient presented to the ED for evaluation of weakness.  Patient  also had dyspnea.  No focal neurologic symptoms to suggest stroke.  Head CT without acute findings.  Patient was noted to have a new oxygen requirement.  This is the likely cause of her lightheadedness.  Patient has inhalers.  This may be a component of COPD exacerbation but I will order a CT angio of her chest considering her new oxygen requirement.  COVID-19 is a concern although initial Covid screen is negative.  I will consult the medical service for admission and further treatment.  [name] was evaluated in Emergency Department on 03/14/2019 for the symptoms described in the history  of present illness. She was evaluated in the context of the global COVID-19 pandemic, which necessitated consideration that the patient might be at risk for infection with the SARS-CoV-2 virus that causes COVID-19. Institutional protocols and algorithms that pertain to the evaluation of patients at risk for COVID-19 are in a state of rapid change based on information released by regulatory bodies including the CDC and federal and state organizations. These policies and algorithms were followed during the patient's care in the ED.  Final Clinical Impression(s) / ED Diagnoses COPD, hypoxia   Dorie Rank, MD 03/14/19 2229

## 2019-03-14 NOTE — ED Notes (Signed)
Update given to pt's daughter, Arrie Aran per pt's permission.

## 2019-03-14 NOTE — H&P (Signed)
History and Physical    [name] [id number] DOB: 1928/09/22 DOA: 03/14/2019  PCP: Patient, No Pcp Per  Patient coming from: Home  I have personally briefly reviewed patient's old medical records in [place]  Chief Complaint: Dizziness  HPI: [name] is a 85 y.o. female with medical history significant for chronic combined CHF, NICM s/p BiV ICD/CRT-D, asthma/COPD, paroxysmal atrial flutter (transient occurring 48 seconds in duration) not on anticoagulation, hypertension, and hypothyroidism who presents to the ED for evaluation of dizziness and progressive shortness of breath.  Patient reports having chronic neuropathy in her legs and balance issues since undergoing chemotherapy for ovarian cancer over 20 years ago.  She says this morning when she awoke she had new onset of lightheadedness described as a feeling of being off balance without a room spinning sensation.  She says the symptoms persisted throughout most of the day.  Normally she uses a walker to ambulate but occasionally requires a wheelchair.  She says she was sitting in her wheelchair earlier today when she was leaning forward reaching for something when she slipped out and went to the ground.  She denies any injury or loss of consciousness.  She has not had any associated new headache, change in vision, nausea, vomiting, chest pain, palpitations, dysuria, or diarrhea.  Patient does report chronic shortness of breath which she feels has worsened recently.  She has a rescue albuterol inhaler at home which she is using 4-6 times a day.  She says she is not on any maintenance therapy.  She is not requiring supplemental oxygen at home.  She does have a history of CHF for which she takes Lasix daily.  She has not noticed any new/worsening swelling in her feet or legs.  She denies any subjective fevers, chills, or diaphoresis.  ED Course:  Initial vitals show BP 196/81, pulse 84, RR 20, temp 98.1 Fahrenheit, SPO2 98%  on room air while at rest.  Per EDP, O2 saturation dropped to 80s with ambulation.  Labs show WBC 7.3, hemoglobin 13.5, platelets 253,000, sodium 134, potassium 4.2, bicarb 30, BUN 14, creatinine 0.71, urinalysis negative for UTI, UDS negative, serum ethanol level undetectable.  POC SARS-CoV-2 antigen test is negative.  SARS-CoV-2 PCR test is ordered and pending.  Portable chest x-ray shows left-sided AICD in place, chronic coarse interstitial markings bilaterally, without acute focal consolidation, effusion, or pneumothorax.  CTA chest PE study was ordered and pending.  Patient was given albuterol inhaler treatment, IV Solu-Medrol 125 mg once and the hospitalist service was consulted to admit for further evaluation and management.  Review of Systems: All systems reviewed and are negative except as documented in history of present illness above.   Past Medical History:  Diagnosis Date  . AICD (automatic cardioverter/defibrillator) present 06/27/2007   medtronic  . ARF (acute renal failure) ([place]) 09/2017  . Cancer ([place])    ovarian--stage 4  . Cardiomyopathy ([place])   . CHF (congestive heart failure) ([place])   . Dyspnea   . Peripheral neuropathy   . Systemic hypertension   . Thyroid disease     Past Surgical History:  Procedure Laterality Date  . ABDOMINAL HYSTERECTOMY  oct 2000   along with ovarian ca surg  . APPENDECTOMY    . CARDIAC CATHETERIZATION  09/27/1990   normal coronaries,dilated cardiomyopathy  . CARDIAC DEFIBRILLATOR PLACEMENT  06/27/2007   medtronic concerto  . CHOLECYSTECTOMY    . IMPLANTABLE CARDIOVERTER DEFIBRILLATOR GENERATOR CHANGE N/A 07/31/2013   Procedure: IMPLANTABLE  CARDIOVERTER DEFIBRILLATOR GENERATOR CHANGE;  Surgeon: Sanda Klein, MD;  Location: [facility] CATH LAB;  Service: Cardiovascular;  Laterality: N/A;  . NM MYOCAR PERF WALL MOTION  06/05/2007   no ischemia    Social History:  reports that she has never smoked. She has never used smokeless tobacco. She  reports that she does not drink alcohol or use drugs.  No Known Allergies  Family History  Problem Relation Age of Onset  . Lung cancer Mother        died from cancer  . Heart attack Father   . Lung cancer Father   . Heart disease Paternal Grandmother      Prior to Admission medications   Medication Sig Start Date End Date Taking? Authorizing Provider  alendronate (FOSAMAX) 70 MG tablet Take 70 mg by mouth every Sunday.  09/01/15   [provider]  amLODipine (NORVASC) 5 MG tablet TAKE 1 TABLET BY MOUTH EVERY DAY 08/22/18   Erlene Quan, PA-C  aspirin EC 81 MG tablet Take 1 tablet (81 mg total) by mouth daily. 10/03/17   Croitoru, Mihai, MD  Calcium Carbonate-Vitamin D 600-400 MG-UNIT chew tablet Chew 2 tablets by mouth daily.     [provider]  carvedilol (COREG) 12.5 MG tablet TAKE 1 TABLET BY MOUTH 2 TIMES DAILY WITH A MEAL. PLEASE MAKE APPT WITH DR. Caryl Comes FOR SEPTEMBER 01/12/19   Croitoru, Mihai, MD  co-enzyme Q-10 50 MG capsule Take 50 mg by mouth daily. Reported on 09/29/2015    [provider]  DULERA 200-5 MCG/ACT AERO Inhale 2 puffs into the lungs daily as needed for wheezing or shortness of breath.  09/22/15   [provider]  DULoxetine (CYMBALTA) 60 MG capsule Take 60 mg by mouth daily.  08/25/15   [provider]  furosemide (LASIX) 20 MG tablet TAKE 1 TABLET BY MOUTH EVERY DAY 10/16/18   Croitoru, Mihai, MD  gabapentin (NEURONTIN) 100 MG capsule Take 100-300 mg by mouth at bedtime as needed (pain).    [provider]  liothyronine (CYTOMEL) 25 MCG tablet Take 25 mcg by mouth daily. 07/02/15   [provider]  Melatonin 10 MG TABS Take 10 mg by mouth at bedtime as needed (sleep).    [provider]  oxyCODONE (ROXICODONE) 15 MG immediate release tablet Take 15 mg by mouth every 6 (six) hours as needed for pain.  02/28/15   [provider]  SYNTHROID 112 MCG tablet Take 112 mcg by mouth daily before  breakfast.  09/12/15   [provider]    Physical Exam: Vitals:   03/14/19 1838 03/14/19 1930 03/14/19 2159 03/14/19 2330  BP:  (!) 169/82 (!) 167/95 (!) 178/81  Pulse:  77 73 78  Resp:   15 (!) 24  Temp:   98.1 F (36.7 C)   TempSrc:   Oral   SpO2:  98% 96% 92%  Weight: 59 kg     Height: 5\' 2"  (1.575 m)       Constitutional: Resting supine in bed with head elevated, NAD, calm, comfortable Eyes: PERRL, EOMI, lids and conjunctivae normal ENMT: Mucous membranes are moist. Posterior pharynx clear of any exudate or lesions.Normal dentition.  Neck: normal, supple, no masses. Respiratory: Coarse expiratory wheezing bilateral lung fields.  Normal respiratory effort. No accessory muscle use.  Cardiovascular: Regular rate and rhythm, no murmurs / rubs / gallops. No extremity edema. 2+ pedal pulses. Abdomen: no tenderness, no masses palpated. No hepatosplenomegaly. Bowel sounds positive.  Musculoskeletal:  no clubbing / cyanosis. No joint deformity upper and lower extremities. Good ROM, no contractures. Normal muscle tone.  Skin: no rashes, lesions, ulcers. No induration Neurologic: CN 2-12 grossly intact. Sensation intact, Strength 5/5 in all 4.  No dysmetria. Psychiatric: Normal judgment and insight. Alert and oriented x 3. Normal mood.     Labs on Admission: I have personally reviewed following labs and imaging studies  CBC: Recent Labs  Lab 03/14/19 1932  WBC 7.3  NEUTROABS 5.2  HGB 13.5  HCT 42.8  MCV 97.5  PLT 123456   Basic Metabolic Panel: Recent Labs  Lab 03/14/19 1932  NA 134*  K 4.2  CL 93*  CO2 30  GLUCOSE 105*  BUN 14  CREATININE 0.71  CALCIUM 9.0   GFR: Estimated Creatinine Clearance: 38.4 mL/min (by C-G formula based on SCr of 0.71 mg/dL). Liver Function Tests: Recent Labs  Lab 03/14/19 1932  AST 25  ALT 21  ALKPHOS 109  BILITOT 0.7  PROT 7.5  ALBUMIN 3.4*   No results for input(s): LIPASE, AMYLASE in the last 168 hours. No results for  input(s): AMMONIA in the last 168 hours. Coagulation Profile: Recent Labs  Lab 03/14/19 1932  INR 0.9   Cardiac Enzymes: No results for input(s): CKTOTAL, CKMB, CKMBINDEX, TROPONINI in the last 168 hours. BNP (last 3 results) No results for input(s): PROBNP in the last 8760 hours. HbA1C: No results for input(s): HGBA1C in the last 72 hours. CBG: No results for input(s): GLUCAP in the last 168 hours. Lipid Profile: No results for input(s): CHOL, HDL, LDLCALC, TRIG, CHOLHDL, LDLDIRECT in the last 72 hours. Thyroid Function Tests: No results for input(s): TSH, T4TOTAL, FREET4, T3FREE, THYROIDAB in the last 72 hours. Anemia Panel: No results for input(s): VITAMINB12, FOLATE, FERRITIN, TIBC, IRON, RETICCTPCT in the last 72 hours. Urine analysis:    Component Value Date/Time   COLORURINE YELLOW 03/14/2019 1926   APPEARANCEUR CLEAR 03/14/2019 1926   LABSPEC 1.008 03/14/2019 1926   PHURINE 7.0 03/14/2019 1926   GLUCOSEU NEGATIVE 03/14/2019 1926   HGBUR NEGATIVE 03/14/2019 [place] NEGATIVE 03/14/2019 [place] NEGATIVE 03/14/2019 1926   PROTEINUR NEGATIVE 03/14/2019 1926   NITRITE NEGATIVE 03/14/2019 1926   LEUKOCYTESUR SMALL (A) 03/14/2019 1926    Radiological Exams on Admission: CT HEAD WO CONTRAST  Result Date: 03/14/2019 CLINICAL DATA:  Dizziness EXAM: CT HEAD WITHOUT CONTRAST TECHNIQUE: Contiguous axial images were obtained from the base of the skull through the vertex without intravenous contrast. COMPARISON:  09/30/2015 FINDINGS: Brain: There is atrophy and chronic small vessel disease changes. No acute intracranial abnormality. Specifically, no hemorrhage, hydrocephalus, mass lesion, acute infarction, or significant intracranial injury. Arachnoid cyst seen in the right middle cranial fossa, stable. Vascular: No hyperdense vessel or unexpected calcification. Skull: No acute calvarial abnormality. Sinuses/Orbits: Visualized paranasal sinuses and mastoids clear.  Orbital soft tissues unremarkable. Other: None IMPRESSION: Atrophy, chronic microvascular disease. No acute intracranial abnormality. Electronically Signed   By: Rolm Baptise M.D.   On: 03/14/2019 20:06   DG Chest Portable 1 View  Result Date: 03/14/2019 CLINICAL DATA:  Dyspnea EXAM: PORTABLE CHEST 1 VIEW COMPARISON:  10/05/2017 FINDINGS: Left-sided implanted cardiac device unchanged in positioning. Mild cardiomegaly, unchanged. Aorta is calcified. Elevation of the left hemidiaphragm. Right lung is hyperexpanded. There are coarsened interstitial markings bilaterally. No focal airspace consolidation, pleural effusion, or pneumothorax. IMPRESSION: Chronic lung changes without superimposed acute cardiopulmonary process. Electronically Signed   By: Davina Poke M.D.   On:  03/14/2019 21:25   CUP PACEART REMOTE DEVICE CHECK  Result Date: 03/14/2019 Scheduled remote reviewed.  Normal device function.  Estimated longevity 4 months Next remote 31 days.   EKG: Independently reviewed. V paced rhythm.  Similar to prior.  Assessment/Plan Principal Problem:   Acute respiratory failure with hypoxia (HCC) Active Problems:   Chronic diastolic heart failure (HCC)   Hypothyroidism   COPD with acute exacerbation ([place])  [name] is a 85 y.o. female with medical history significant for chronic combined CHF, NICM s/p BiV ICD/CRT-D, asthma/COPD, paroxysmal atrial flutter (transient occurring 48 seconds in duration) not on anticoagulation, hypertension, and hypothyroidism who is admitted with acute respiratory failure with hypoxia due to asthma/COPD exacerbation.  Acute respiratory failure with hypoxia due to asthma/COPD exacerbation: Patient with coarse expiratory wheezing on admission and new oxygen requirement while ambulating.  Only using a rescue inhaler at home, required it 4-6 times per day. -Start scheduled DuoNebs and as needed albuterol nebulizers -Continue IV Solu-Medrol 40 mg twice  daily -Continue supplemental O2 as needed  Lightheadedness: Reported as sensation of feeling off balance without room spinning sensation.  Reports chronic issues of balance though this time is worse.  Neurological exam without focal deficits.  Symptoms not typical of cerebellar stroke although cannot rule out this.  She is unable to obtain MRI due to AICD.  CT head without contrast shows atrophy and chronic microvascular disease without acute intracranial abnormality. -Check orthostatic vitals -PT/OT eval  Chronic combined CHF/NICM s/p BiV ICD/CRT-D: Appears euvolemic on admission.  Follows with cardiology, Dr. Sallyanne Kuster.  Last EF 55-60% with G1DD by echo 10/17/2017. -Continue Coreg, consider change to bisoprolol with ongoing respiratory issues -Resume Lasix tomorrow -Not on ACE/ARB due to recent hyperkalemia -Monitor I/O's  Hypertension: Hypertensive on admission, chart review has had uncontrolled hypertension over the last year. -Continue Coreg -Increase amlodipine to 10 mg daily  Hypothyroidism: Patient reports congenital hypothyroidism for which she is taking both levothyroxine and liothyronine.  Continue both.  Osteoarthritis/neuropathy/chronic pain: Continue gabapentin, Cymbalta, and as needed oxycodone.   DVT prophylaxis: Lovenox Code Status: DNR, confirmed with patient Family Communication: Discussed with patient, she has discussed with her daughter Disposition Plan: Pending clinical progress Consults called: None Admission status: Observation   Zada Finders MD [facility]  If 7PM-7AM, please contact night-coverage [email or web address]  03/15/2019, 12:02 AM

## 2019-03-14 NOTE — ED Notes (Signed)
Spoke with pt's daughter, Arrie Aran. [name] states that she has medication information. She would like call once pt has disposition [phone]

## 2019-03-14 NOTE — ED Triage Notes (Signed)
Pt BIB [facility] from home. Pt is c/o dizziness and fatigue that started this am. Pt was in her w/c and slid out of it onto her bottom. Reports no injury from this incident. Upon arrival EMS stated pts O2 was in the 80s. 2 L [place] brought her up to 100%. Hx of COPD no O2 needed at home normally.

## 2019-03-15 ENCOUNTER — Ambulatory Visit: Payer: Medicare Other | Admitting: Physician Assistant

## 2019-03-15 ENCOUNTER — Observation Stay (HOSPITAL_COMMUNITY): Payer: Medicare Other

## 2019-03-15 ENCOUNTER — Encounter (HOSPITAL_COMMUNITY): Payer: Self-pay | Admitting: Internal Medicine

## 2019-03-15 DIAGNOSIS — Z90710 Acquired absence of both cervix and uterus: Secondary | ICD-10-CM | POA: Diagnosis not present

## 2019-03-15 DIAGNOSIS — I951 Orthostatic hypotension: Secondary | ICD-10-CM | POA: Diagnosis present

## 2019-03-15 DIAGNOSIS — Z9981 Dependence on supplemental oxygen: Secondary | ICD-10-CM | POA: Diagnosis not present

## 2019-03-15 DIAGNOSIS — I480 Paroxysmal atrial fibrillation: Secondary | ICD-10-CM | POA: Diagnosis present

## 2019-03-15 DIAGNOSIS — Z9049 Acquired absence of other specified parts of digestive tract: Secondary | ICD-10-CM | POA: Diagnosis not present

## 2019-03-15 DIAGNOSIS — J9600 Acute respiratory failure, unspecified whether with hypoxia or hypercapnia: Secondary | ICD-10-CM | POA: Diagnosis present

## 2019-03-15 DIAGNOSIS — G894 Chronic pain syndrome: Secondary | ICD-10-CM | POA: Diagnosis present

## 2019-03-15 DIAGNOSIS — I5042 Chronic combined systolic (congestive) and diastolic (congestive) heart failure: Secondary | ICD-10-CM | POA: Diagnosis present

## 2019-03-15 DIAGNOSIS — J440 Chronic obstructive pulmonary disease with acute lower respiratory infection: Secondary | ICD-10-CM | POA: Diagnosis present

## 2019-03-15 DIAGNOSIS — J441 Chronic obstructive pulmonary disease with (acute) exacerbation: Secondary | ICD-10-CM | POA: Diagnosis present

## 2019-03-15 DIAGNOSIS — J189 Pneumonia, unspecified organism: Secondary | ICD-10-CM | POA: Diagnosis present

## 2019-03-15 DIAGNOSIS — J9601 Acute respiratory failure with hypoxia: Secondary | ICD-10-CM | POA: Diagnosis present

## 2019-03-15 DIAGNOSIS — T451X5D Adverse effect of antineoplastic and immunosuppressive drugs, subsequent encounter: Secondary | ICD-10-CM | POA: Diagnosis not present

## 2019-03-15 DIAGNOSIS — Z66 Do not resuscitate: Secondary | ICD-10-CM | POA: Diagnosis present

## 2019-03-15 DIAGNOSIS — J45901 Unspecified asthma with (acute) exacerbation: Secondary | ICD-10-CM | POA: Diagnosis present

## 2019-03-15 DIAGNOSIS — I110 Hypertensive heart disease with heart failure: Secondary | ICD-10-CM | POA: Diagnosis present

## 2019-03-15 DIAGNOSIS — E031 Congenital hypothyroidism without goiter: Secondary | ICD-10-CM | POA: Diagnosis present

## 2019-03-15 DIAGNOSIS — Z95810 Presence of automatic (implantable) cardiac defibrillator: Secondary | ICD-10-CM | POA: Diagnosis not present

## 2019-03-15 DIAGNOSIS — Z993 Dependence on wheelchair: Secondary | ICD-10-CM | POA: Diagnosis not present

## 2019-03-15 DIAGNOSIS — I428 Other cardiomyopathies: Secondary | ICD-10-CM | POA: Diagnosis present

## 2019-03-15 DIAGNOSIS — G620 Drug-induced polyneuropathy: Secondary | ICD-10-CM | POA: Diagnosis present

## 2019-03-15 DIAGNOSIS — I6782 Cerebral ischemia: Secondary | ICD-10-CM | POA: Diagnosis present

## 2019-03-15 DIAGNOSIS — R0602 Shortness of breath: Secondary | ICD-10-CM | POA: Diagnosis not present

## 2019-03-15 DIAGNOSIS — Z20828 Contact with and (suspected) exposure to other viral communicable diseases: Secondary | ICD-10-CM | POA: Diagnosis present

## 2019-03-15 DIAGNOSIS — I4892 Unspecified atrial flutter: Secondary | ICD-10-CM | POA: Diagnosis present

## 2019-03-15 DIAGNOSIS — M1990 Unspecified osteoarthritis, unspecified site: Secondary | ICD-10-CM | POA: Diagnosis present

## 2019-03-15 LAB — CBC
HCT: 42.7 % (ref 36.0–46.0)
Hemoglobin: 13.4 g/dL (ref 12.0–15.0)
MCH: 30.7 pg (ref 26.0–34.0)
MCHC: 31.4 g/dL (ref 30.0–36.0)
MCV: 97.9 fL (ref 80.0–100.0)
Platelets: 230 10*3/uL (ref 150–400)
RBC: 4.36 MIL/uL (ref 3.87–5.11)
RDW: 13.1 % (ref 11.5–15.5)
WBC: 8.3 10*3/uL (ref 4.0–10.5)
nRBC: 0 % (ref 0.0–0.2)

## 2019-03-15 LAB — CUP PACEART REMOTE DEVICE CHECK
Battery Remaining Longevity: 5 mo
Battery Voltage: 2.79 V
Brady Statistic AP VP Percent: 5.07 %
Brady Statistic AP VS Percent: 0.11 %
Brady Statistic AS VP Percent: 93.44 %
Brady Statistic AS VS Percent: 1.39 %
Brady Statistic RA Percent Paced: 5.17 %
Brady Statistic RV Percent Paced: 97.65 %
Date Time Interrogation Session: 20201216132409
HighPow Impedance: 47 Ohm
HighPow Impedance: 60 Ohm
Implantable Lead Implant Date: 20090331
Implantable Lead Implant Date: 20090331
Implantable Lead Implant Date: 20090401
Implantable Lead Location: 753858
Implantable Lead Location: 753859
Implantable Lead Location: 753860
Implantable Lead Model: 4194
Implantable Lead Model: 5076
Implantable Lead Model: 6947
Implantable Pulse Generator Implant Date: 20150505
Lead Channel Impedance Value: 342 Ohm
Lead Channel Impedance Value: 342 Ohm
Lead Channel Impedance Value: 418 Ohm
Lead Channel Impedance Value: 456 Ohm
Lead Channel Impedance Value: 475 Ohm
Lead Channel Impedance Value: 608 Ohm
Lead Channel Pacing Threshold Amplitude: 0.75 V
Lead Channel Pacing Threshold Amplitude: 1.375 V
Lead Channel Pacing Threshold Amplitude: 1.5 V
Lead Channel Pacing Threshold Pulse Width: 0.4 ms
Lead Channel Pacing Threshold Pulse Width: 0.4 ms
Lead Channel Pacing Threshold Pulse Width: 0.4 ms
Lead Channel Sensing Intrinsic Amplitude: 18.625 mV
Lead Channel Sensing Intrinsic Amplitude: 18.625 mV
Lead Channel Sensing Intrinsic Amplitude: 2.5 mV
Lead Channel Sensing Intrinsic Amplitude: 2.5 mV
Lead Channel Setting Pacing Amplitude: 2 V
Lead Channel Setting Pacing Amplitude: 2.5 V
Lead Channel Setting Pacing Amplitude: 3.25 V
Lead Channel Setting Pacing Pulse Width: 0.4 ms
Lead Channel Setting Pacing Pulse Width: 0.4 ms
Lead Channel Setting Sensing Sensitivity: 0.3 mV

## 2019-03-15 LAB — BASIC METABOLIC PANEL
Anion gap: 11 (ref 5–15)
BUN: 12 mg/dL (ref 8–23)
CO2: 30 mmol/L (ref 22–32)
Calcium: 8.9 mg/dL (ref 8.9–10.3)
Chloride: 91 mmol/L — ABNORMAL LOW (ref 98–111)
Creatinine, Ser: 0.65 mg/dL (ref 0.44–1.00)
GFR calc Af Amer: >60 mL/min (ref >60)
GFR calc non Af Amer: >60 mL/min (ref >60)
Glucose, Bld: 165 mg/dL — ABNORMAL HIGH (ref 70–99)
Potassium: 4.3 mmol/L (ref 3.5–5.1)
Sodium: 132 mmol/L — ABNORMAL LOW (ref 135–145)

## 2019-03-15 LAB — BRAIN NATRIURETIC PEPTIDE: B Natriuretic Peptide: 167.7 pg/mL — ABNORMAL HIGH (ref 0.0–100.0)

## 2019-03-15 LAB — SARS CORONAVIRUS 2 (TAT 6-24 HRS): SARS Coronavirus 2: NEGATIVE

## 2019-03-15 MED ORDER — OXYCODONE HCL 5 MG PO TABS
15.0000 mg | ORAL_TABLET | Freq: Four times a day (QID) | ORAL | Status: DC | PRN
  Administered 2019-03-15 – 2019-03-18 (×12): 15 mg via ORAL
  Filled 2019-03-15 (×12): qty 3

## 2019-03-15 MED ORDER — IOHEXOL 350 MG/ML SOLN
100.0000 mL | Freq: Once | INTRAVENOUS | Status: AC | PRN
  Administered 2019-03-15: 80 mL via INTRAVENOUS

## 2019-03-15 MED ORDER — IPRATROPIUM-ALBUTEROL 0.5-2.5 (3) MG/3ML IN SOLN
3.0000 mL | Freq: Three times a day (TID) | RESPIRATORY_TRACT | Status: DC
  Administered 2019-03-15 – 2019-03-16 (×3): 3 mL via RESPIRATORY_TRACT
  Filled 2019-03-15 (×3): qty 3

## 2019-03-15 MED ORDER — SODIUM CHLORIDE (PF) 0.9 % IJ SOLN
INTRAMUSCULAR | Status: AC
  Filled 2019-03-15: qty 50

## 2019-03-15 MED ORDER — LABETALOL HCL 5 MG/ML IV SOLN
10.0000 mg | INTRAVENOUS | Status: DC | PRN
  Administered 2019-03-15 – 2019-03-18 (×5): 10 mg via INTRAVENOUS
  Filled 2019-03-15 (×7): qty 4

## 2019-03-15 NOTE — Progress Notes (Signed)
Patient educated on fall precautions, call bell and low bed criteria. Patient refused low bed and states "I like the bed im in now." Floor mats were placed and bed alarm is on.

## 2019-03-15 NOTE — ED Notes (Signed)
Pt linens changed and purewic replaced. Pt given food tray as well as pain medications.

## 2019-03-15 NOTE — Progress Notes (Signed)
OT Cancellation Note  Patient Details Name: [name] MRN: [id number] DOB: [birth year]   Cancelled Treatment:    Reason Eval/Treat Not Completed: Medical issues which prohibited therapy OT consult received and chart reviewed. Upon chart review, pt noted to have significantly elevated BP (180/92 at 0700, 197/105 at 0902). Not appropriate for participation in therapy at this time, will f/u for OT evaluation as pt becomes more appropriate. Thank you.  Jake Church Nailah Luepke 03/15/2019, 9:24 AM

## 2019-03-15 NOTE — Progress Notes (Addendum)
PROGRESS NOTE    [name]  [id number] DOB: Mar 30, 1928 DOA: 03/14/2019 PCP: Patient, No Pcp Per   Brief Narrative:  Patient is a 85 year old female with history of chronic combined CHF, nonischemic cardiomyopathy, status post biventricular ICD, asthma/COPD, proximal A. fib on anticoagulation, hypertension, hypothyroidism, chronic back pain who presents to the emergency department for the evaluation of dizziness, shortness of breath.  Patient lives with her daughter.  Ambulates with the help of walker/wheelchair.  Patient was admitted for the management of COPD exacerbation.  Covid 19 test negative.  Assessment & Plan:   Principal Problem:   Acute respiratory failure with hypoxia (HCC) Active Problems:   Chronic diastolic heart failure (HCC)   Hypothyroidism   COPD with acute exacerbation (HCC)   Acute respiratory failure (HCC)   Acute respiratory failure with hypoxia due to COPD exacerbation: Presented with wheezing, new oxygen requirement while ambulating.  Continue bronchodilators, steroids.  Continue supplemental oxygen as needed.  Will check if she qualifies for home oxygen.  This morning she still has some bilateral expiratory wheezes.  Lightheadedness/dizziness: CT head did not show any acute intracranial normalities.  PT/OT evaluation pending.  Currently she denies any dizziness.  Chronic combined CHF/nonischemic cardiomyopathy status post biventricular ICD: Currently euvolemic.  Follows with cardiology, Dr. Sallyanne Kuster.  Ejection  fraction of 55 to 60% with grade 1 diastolic dysfunction as per echo on 10/15/2017.  Continue her home medicines.  She is on Coreg, Lasix at home.  Hypertension: Hypertensive on presentation.  Continue her home medicines.  Continue as needed meds  Hypothyroidism: Continue Synthyroid  Chronic debility/osteoarthritis/neuropathy/chronic pain syndrome: Complains of severe back pain.  Continue gabapentin, Cymbalta, as needed oxycodone.  PT/OT  evaluation pending.        DVT prophylaxis:Lovenox Code Status: DNR  family Communication: None present at the bedside Disposition Plan: Waiting for PT/OT evaluation.  Likely home after improvement in respiratory status   Consultants: None  Procedures: None  Antimicrobials:  Anti-infectives (From admission, onward)   None      Subjective: Patient seen and examined the bedside this morning in the emergency department.  Her respiratory status looks improved.  She was complaining of severe back pain.  Denies any dizziness during my evaluation.  She was not in any kind of respiratory distress.  On supplemental oxygen.  Objective: Vitals:   03/15/19 0902 03/15/19 1100 03/15/19 1200 03/15/19 1400  BP: (!) 197/105 (!) 178/90 (!) 178/90 (!) 156/88  Pulse: 100 87 87 88  Resp: 16 16 16 16   Temp:      TempSrc:      SpO2: 93% 96% 96% 96%  Weight:      Height:       No intake or output data in the 24 hours ending 03/15/19 1426 Filed Weights   03/14/19 1838  Weight: 59 kg    Examination:  General exam: Elderly, debilitated, deconditioned HEENT:PERRL,Oral mucosa moist, Ear/Nose normal on gross exam Respiratory system: Bilateral decreased air entry, expiratory wheezes Cardiovascular system: S1 & S2 heard, RRR. No JVD, murmurs, rubs, gallops or clicks. No pedal edema. Gastrointestinal system: Abdomen is nondistended, soft and nontender. No organomegaly or masses felt. Normal bowel sounds heard. Central nervous system: Alert and oriented. No focal neurological deficits. Extremities: No edema, no clubbing ,no cyanosis Skin: No rashes, lesions or ulcers,no icterus ,no pallor      Data Reviewed: I have personally reviewed following labs and imaging studies  CBC: Recent Labs  Lab 03/14/19 1932 03/15/19 0500  WBC  7.3 8.3  NEUTROABS 5.2  --   HGB 13.5 13.4  HCT 42.8 42.7  MCV 97.5 97.9  PLT 253 [id number]   Basic Metabolic Panel: Recent Labs  Lab 03/14/19 1932  03/15/19 0500  NA 134* 132*  K 4.2 4.3  CL 93* 91*  CO2 30 30  GLUCOSE 105* 165*  BUN 14 12  CREATININE 0.71 0.65  CALCIUM 9.0 8.9   GFR: Estimated Creatinine Clearance: 38.4 mL/min (by C-G formula based on SCr of 0.65 mg/dL). Liver Function Tests: Recent Labs  Lab 03/14/19 1932  AST 25  ALT 21  ALKPHOS 109  BILITOT 0.7  PROT 7.5  ALBUMIN 3.4*   No results for input(s): LIPASE, AMYLASE in the last 168 hours. No results for input(s): AMMONIA in the last 168 hours. Coagulation Profile: Recent Labs  Lab 03/14/19 1932  INR 0.9   Cardiac Enzymes: No results for input(s): CKTOTAL, CKMB, CKMBINDEX, TROPONINI in the last 168 hours. BNP (last 3 results) No results for input(s): PROBNP in the last 8760 hours. HbA1C: No results for input(s): HGBA1C in the last 72 hours. CBG: No results for input(s): GLUCAP in the last 168 hours. Lipid Profile: No results for input(s): CHOL, HDL, LDLCALC, TRIG, CHOLHDL, LDLDIRECT in the last 72 hours. Thyroid Function Tests: No results for input(s): TSH, T4TOTAL, FREET4, T3FREE, THYROIDAB in the last 72 hours. Anemia Panel: No results for input(s): VITAMINB12, FOLATE, FERRITIN, TIBC, IRON, RETICCTPCT in the last 72 hours. Sepsis Labs: No results for input(s): PROCALCITON, LATICACIDVEN in the last 168 hours.  Recent Results (from the past 240 hour(s))  SARS CORONAVIRUS 2 (TAT 6-24 HRS) Nasopharyngeal Nasopharyngeal Swab     Status: None   Collection Time: 03/14/19 10:29 PM   Specimen: Nasopharyngeal Swab  Result Value Ref Range Status   SARS Coronavirus 2 NEGATIVE NEGATIVE Final    Comment: (NOTE) SARS-CoV-2 target nucleic acids are NOT DETECTED. The SARS-CoV-2 RNA is generally detectable in upper and lower respiratory specimens during the acute phase of infection. Negative results do not preclude SARS-CoV-2 infection, do not rule out co-infections with other pathogens, and should not be used as the sole basis for treatment or other  patient management decisions. Negative results must be combined with clinical observations, patient history, and epidemiological information. The expected result is Negative. Fact Sheet for Patients: SugarRoll.be Fact Sheet for Healthcare Providers: https://www.woods-mathews.com/ This test is not yet approved or cleared by the [place] FDA and  has been authorized for detection and/or diagnosis of SARS-CoV-2 by FDA under an Emergency Use Authorization (EUA). This EUA will remain  in effect (meaning this test can be used) for the duration of the COVID-19 declaration under Section 56 4(b)(1) of the Act, 21 U.S.C. section 360bbb-3(b)(1), unless the authorization is terminated or revoked sooner. Performed at [facility], [place] [address], [place], [place] [address]          Radiology Studies: CT HEAD WO CONTRAST  Result Date: 03/14/2019 CLINICAL DATA:  Dizziness EXAM: CT HEAD WITHOUT CONTRAST TECHNIQUE: Contiguous axial images were obtained from the base of the skull through the vertex without intravenous contrast. COMPARISON:  09/30/2015 FINDINGS: Brain: There is atrophy and chronic small vessel disease changes. No acute intracranial abnormality. Specifically, no hemorrhage, hydrocephalus, mass lesion, acute infarction, or significant intracranial injury. Arachnoid cyst seen in the right middle cranial fossa, stable. Vascular: No hyperdense vessel or unexpected calcification. Skull: No acute calvarial abnormality. Sinuses/Orbits: Visualized paranasal sinuses and mastoids clear. Orbital soft tissues unremarkable. Other:  None IMPRESSION: Atrophy, chronic microvascular disease. No acute intracranial abnormality. Electronically Signed   By: Rolm Baptise M.D.   On: 03/14/2019 20:06   CT Angio Chest PE W and/or Wo Contrast  Result Date: 03/15/2019 CLINICAL DATA:  85 year old with shortness of breath and decreased oxygen saturation. Dizziness  and fatigue. EXAM: CT ANGIOGRAPHY CHEST WITH CONTRAST TECHNIQUE: Multidetector CT imaging of the chest was performed using the standard protocol during bolus administration of intravenous contrast. Multiplanar CT image reconstructions and MIPs were obtained to evaluate the vascular anatomy. CONTRAST:  1mL OMNIPAQUE IOHEXOL 350 MG/ML SOLN COMPARISON:  Radiograph yesterday. FINDINGS: Cardiovascular: There are no filling defects within the pulmonary arteries to suggest pulmonary embolus. Atherosclerosis and tortuosity of the thoracic aorta. No aortic dissection. Pacemaker in place. Mild cardiomegaly. No pericardial effusion. Mediastinum/Nodes: Prominent right hilar node measuring 9 mm short axis. No enlarged mediastinal lymph nodes. No esophageal wall thickening. Lungs/Pleura: Tree in bud and reticulonodular opacities throughout both lungs, most prominent at the apices. Streaky and linear opacities in the lingula and lower lobes. Slightly consolidative opacity in the anteromedial right middle lobe, series 6 image 111. Minimal central atelectasis in the medial left lower lobe. There is moderate breathing motion artifact which limits assessment. There is a 6 mm anterior right upper lobe pulmonary nodule, series 6, image 71. central bronchial thickening. Additionally nodular densities which appear part of the reticulonodular process. No pleural effusion. Upper Abdomen: Left adrenal thickening without dominant nodule. No acute findings. Musculoskeletal: Mild anterior wedging of T1 and T12 vertebra. Scoliotic curvature of spine. No focal bone lesion. Review of the MIP images confirms the above findings. IMPRESSION: 1. No pulmonary embolus. 2. Tree in bud and reticulonodular opacities throughout both lungs, most prominent at the apices. Findings most consistent with bronchiolitis, likely infectious or inflammatory, including atypical infections such is MAI, however distribution is not classic. Right upper lobe 6 mm  pulmonary nodule may be part of the diffuse process, however recommend follow-up chest CT to evaluate for resolution/stability. Non-contrast chest CT at 3-6 months is recommended. 3. Additional streaky and slightly more confluent opacities in the lower lobes and right middle lobe, atelectasis versus pneumonia. 4. Mild anterior wedging of T1 and T12 vertebra, age indeterminate but likely chronic. Aortic Atherosclerosis (ICD10-I70.0). Electronically Signed   By: Keith Rake M.D.   On: 03/15/2019 06:19   DG Chest Portable 1 View  Result Date: 03/14/2019 CLINICAL DATA:  Dyspnea EXAM: PORTABLE CHEST 1 VIEW COMPARISON:  10/05/2017 FINDINGS: Left-sided implanted cardiac device unchanged in positioning. Mild cardiomegaly, unchanged. Aorta is calcified. Elevation of the left hemidiaphragm. Right lung is hyperexpanded. There are coarsened interstitial markings bilaterally. No focal airspace consolidation, pleural effusion, or pneumothorax. IMPRESSION: Chronic lung changes without superimposed acute cardiopulmonary process. Electronically Signed   By: Davina Poke M.D.   On: 03/14/2019 21:25   CUP PACEART REMOTE DEVICE CHECK  Result Date: 03/15/2019 Scheduled remote reviewed.  Normal device function.  Estimated longevity 4 months Next remote 31 days.       Scheduled Meds:  amLODipine  10 mg Oral Daily   aspirin EC  81 mg Oral Daily   carvedilol  12.5 mg Oral BID WC   DULoxetine  60 mg Oral Daily   enoxaparin (LOVENOX) injection  40 mg Subcutaneous QHS   furosemide  20 mg Oral Daily   guaiFENesin  600 mg Oral BID   ipratropium-albuterol  3 mL Nebulization TID   [START ON 03/16/2019] levothyroxine  112 mcg Oral QAC breakfast  liothyronine  25 mcg Oral Daily   methylPREDNISolone (SOLU-MEDROL) injection  40 mg Intravenous Q12H   Continuous Infusions:   LOS: 0 days    Time spent:25 mins. More than 50% of that time was spent in counseling and/or coordination of  care.      Shelly Coss, MD [facility] Pager [phone]  If 7PM-7AM, please contact night-coverage [email or web address] Password [email or web address] 03/15/2019, 2:26 PM

## 2019-03-15 NOTE — Progress Notes (Signed)
PT Cancellation Note  Patient Details Name: [name] MRN: [id number] DOB: May 27, 1928   Cancelled Treatment:     PT order received but eval deferred this am 2* pts elevated BP.  RN advises pt will be admitted and transferred upstairs.  Will follow.  Debe Coder PT Acute Rehabilitation Services Pager [phone] Office [phone]    [facility] 03/15/2019, 11:42 AM

## 2019-03-15 NOTE — ED Notes (Signed)
ED TO INPATIENT HANDOFF REPORT  ED Nurse Name and Phone #: Christopherjohn Schiele   S Name/Age/Gender [name] 85 y.o. female Room/Bed: WA10/WA10  Code Status   Code Status: DNR  Home/SNF/Other Home Patient oriented to: self, place, time and situation Is this baseline? Yes   Triage Complete: Triage complete  Chief Complaint COPD with acute exacerbation ([place]) [J44.1] Acute respiratory failure ([place]) [J96.00] COPD exacerbation ([place]) [J44.1]  Triage Note Pt BIB [facility] from home. Pt is c/o dizziness and fatigue that started this am. Pt was in her w/c and slid out of it onto her bottom. Reports no injury from this incident. Upon arrival EMS stated pts O2 was in the 80s. 2 L [place] brought her up to 100%. Hx of COPD no O2 needed at home normally.     Allergies No Known Allergies  Level of Care/Admitting Diagnosis ED Disposition    ED Disposition Condition Comment   Admit  Hospital Area: [facility] [[id number]]  Level of Care: Telemetry [5]  Admit to tele based on following criteria: Complex arrhythmia (Bradycardia/Tachycardia)  Covid Evaluation: Confirmed COVID Negative  Diagnosis: COPD exacerbation [facility]) [[id number]]  Admitting Physician: Shelly Coss [[id number]]  Attending Physician: Shelly Coss [[id number]]  Estimated length of stay: past midnight tomorrow  Certification:: I certify this patient will need inpatient services for at least 2 midnights       B Medical/Surgery History Past Medical History:  Diagnosis Date  . AICD (automatic cardioverter/defibrillator) present 06/27/2007   medtronic  . ARF (acute renal failure) ([place]) 09/2017  . Cancer ([place])    ovarian--stage 4  . Cardiomyopathy ([place])   . CHF (congestive heart failure) ([place])   . Dyspnea   . Peripheral neuropathy   . Systemic hypertension   . Thyroid disease    Past Surgical History:  Procedure Laterality Date  . ABDOMINAL HYSTERECTOMY  oct 2000   along with ovarian ca surg  . APPENDECTOMY    .  CARDIAC CATHETERIZATION  09/27/1990   normal coronaries,dilated cardiomyopathy  . CARDIAC DEFIBRILLATOR PLACEMENT  06/27/2007   medtronic concerto  . CHOLECYSTECTOMY    . IMPLANTABLE CARDIOVERTER DEFIBRILLATOR GENERATOR CHANGE N/A 07/31/2013   Procedure: IMPLANTABLE CARDIOVERTER DEFIBRILLATOR GENERATOR CHANGE;  Surgeon: Sanda Klein, MD;  Location: [facility] CATH [facility];  Service: Cardiovascular;  Laterality: N/A;  . NM MYOCAR PERF WALL MOTION  06/05/2007   no ischemia     A IV Location/Drains/Wounds Patient Lines/Drains/Airways Status   Active Line/Drains/Airways    Name:   Placement date:   Placement time:   Site:   Days:   Peripheral IV 03/14/19 Anterior;Left Forearm   03/14/19    --    Forearm   1   Peripheral IV 03/15/19 Right;Upper Arm   03/15/19    0350    Arm   less than 1          Intake/Output Last 24 hours No intake or output data in the 24 hours ending 03/15/19 2133  Labs/Imaging Results for orders placed or performed during the hospital encounter of 03/14/19 (from the past 48 hour(s))  Urine rapid drug screen (hosp performed)not at [facility]     Status: None   Collection Time: 03/14/19  7:26 PM  Result Value Ref Range   Opiates NONE DETECTED NONE DETECTED   Cocaine NONE DETECTED NONE DETECTED   Benzodiazepines NONE DETECTED NONE DETECTED   Amphetamines NONE DETECTED NONE DETECTED   Tetrahydrocannabinol NONE DETECTED NONE DETECTED   Barbiturates NONE DETECTED NONE DETECTED  Comment: (NOTE) DRUG SCREEN FOR MEDICAL PURPOSES ONLY.  IF CONFIRMATION IS NEEDED FOR ANY PURPOSE, NOTIFY LAB WITHIN 5 DAYS. LOWEST DETECTABLE LIMITS FOR URINE DRUG SCREEN Drug Class                     Cutoff (ng/mL) Amphetamine and metabolites    1000 Barbiturate and metabolites    200 Benzodiazepine                 016 Tricyclics and metabolites     300 Opiates and metabolites        300 Cocaine and metabolites        300 THC                            50 Performed at [facility], [place] [address], [place], [place] [address]   Urinalysis, Routine w reflex microscopic (not at [facility])     Status: Abnormal   Collection Time: 03/14/19  7:26 PM  Result Value Ref Range   Color, Urine YELLOW YELLOW   APPearance CLEAR CLEAR   Specific Gravity, Urine 1.008 1.005 - 1.030   pH 7.0 5.0 - 8.0   Glucose, UA NEGATIVE NEGATIVE mg/dL   Hgb urine dipstick NEGATIVE NEGATIVE   Bilirubin Urine NEGATIVE NEGATIVE   Ketones, ur NEGATIVE NEGATIVE mg/dL   Protein, ur NEGATIVE NEGATIVE mg/dL   Nitrite NEGATIVE NEGATIVE   Leukocytes,Ua SMALL (A) NEGATIVE   RBC / HPF 0-5 0 - 5 RBC/hpf   WBC, UA 6-10 0 - 5 WBC/hpf   Bacteria, UA RARE (A) NONE SEEN   Squamous Epithelial / LPF 0-5 0 - 5    Comment: Performed at [facility], [place] [address], [place], [place] [address]  Ethanol     Status: None   Collection Time: 03/14/19  7:32 PM  Result Value Ref Range   Alcohol, Ethyl (B) <10 <10 mg/dL    Comment: (NOTE) Lowest detectable limit for serum alcohol is 10 mg/dL. For medical purposes only. Performed at [facility], [place] [address] [place], [place] [address]   Protime-INR     Status: None   Collection Time: 03/14/19  7:32 PM  Result Value Ref Range   Prothrombin Time 12.0 11.4 - 15.2 seconds   INR 0.9 0.8 - 1.2    Comment: (NOTE) INR goal varies based on device and disease states. Performed at [facility], [place] [address] [place], [place] [address]   APTT     Status: None   Collection Time: 03/14/19  7:32 PM  Result Value Ref Range   aPTT 27 24 - 36 seconds    Comment: Performed at [facility], [place] [address] [place], [place] [address]  CBC     Status: None   Collection Time: 03/14/19  7:32 PM  Result Value Ref Range   WBC 7.3 4.0 - 10.5 K/uL   RBC 4.39 3.87 - 5.11 MIL/uL   Hemoglobin 13.5 12.0 - 15.0 g/dL   HCT 42.8 36.0 - 46.0 %   MCV 97.5 80.0 - 100.0 fL   MCH 30.8 26.0 - 34.0 pg    MCHC 31.5 30.0 - 36.0 g/dL   RDW 13.1 11.5 - 15.5 %   Platelets 253 150 - 400 K/uL   nRBC 0.0 0.0 - 0.2 %    Comment: Performed at [facility], [place] [address]  [place], [place] [address]  Differential     Status: None   Collection Time: 03/14/19  7:32 PM  Result Value Ref Range   Neutrophils Relative % 71 %   Neutro Abs 5.2 1.7 - 7.7 K/uL   Lymphocytes Relative 14 %   Lymphs Abs 1.0 0.7 - 4.0 K/uL   Monocytes Relative 9 %   Monocytes Absolute 0.7 0.1 - 1.0 K/uL   Eosinophils Relative 5 %   Eosinophils Absolute 0.3 0.0 - 0.5 K/uL   Basophils Relative 1 %   Basophils Absolute 0.1 0.0 - 0.1 K/uL   Immature Granulocytes 0 %   Abs Immature Granulocytes 0.01 0.00 - 0.07 K/uL    Comment: Performed at [facility], [place] [address] [place], [place] [address]  Comprehensive metabolic panel     Status: Abnormal   Collection Time: 03/14/19  7:32 PM  Result Value Ref Range   Sodium 134 (L) 135 - 145 mmol/L   Potassium 4.2 3.5 - 5.1 mmol/L   Chloride 93 (L) 98 - 111 mmol/L   CO2 30 22 - 32 mmol/L   Glucose, Bld 105 (H) 70 - 99 mg/dL   BUN 14 8 - 23 mg/dL   Creatinine, Ser 0.71 0.44 - 1.00 mg/dL   Calcium 9.0 8.9 - 10.3 mg/dL   Total Protein 7.5 6.5 - 8.1 g/dL   Albumin 3.4 (L) 3.5 - 5.0 g/dL   AST 25 15 - 41 U/L   ALT 21 0 - 44 U/L   Alkaline Phosphatase 109 38 - 126 U/L   Total Bilirubin 0.7 0.3 - 1.2 mg/dL   GFR calc non Af Amer >60 >60 mL/min   GFR calc Af Amer >60 >60 mL/min   Anion gap 11 5 - 15    Comment: Performed at [facility], [place] [address], [place], [place] [address]  POC SARS Coronavirus 2 Ag-ED - Nasal Swab (BD Veritor Kit)     Status: None   Collection Time: 03/14/19  9:50 PM  Result Value Ref Range   SARS Coronavirus 2 Ag NEGATIVE NEGATIVE    Comment: (NOTE) SARS-CoV-2 antigen NOT DETECTED.  Negative results are presumptive.  Negative results do not preclude SARS-CoV-2 infection and should not be used as  the sole basis for treatment or other patient management decisions, including infection  control decisions, particularly in the presence of clinical signs and  symptoms consistent with COVID-19, or in those who have been in contact with the virus.  Negative results must be combined with clinical observations, patient history, and epidemiological information. The expected result is Negative. Fact Sheet for Patients: PodPark.tn Fact Sheet for Healthcare Providers: GiftContent.is This test is not yet approved or cleared by the [place] FDA and  has been authorized for detection and/or diagnosis of SARS-CoV-2 by FDA under an Emergency Use Authorization (EUA).  This EUA will remain in effect (meaning this test can be used) for the duration of  the COVID-19 de claration under Section 564(b)(1) of the Act, 21 U.S.C. section 360bbb-3(b)(1), unless the authorization is terminated or revoked sooner.   SARS CORONAVIRUS 2 (TAT 6-24 HRS) Nasopharyngeal Nasopharyngeal Swab     Status: None   Collection Time: 03/14/19 10:29 PM   Specimen: Nasopharyngeal Swab  Result Value Ref Range   SARS Coronavirus 2 NEGATIVE NEGATIVE    Comment: (NOTE) SARS-CoV-2 target nucleic acids are NOT DETECTED. The SARS-CoV-2 RNA is generally detectable in upper and lower respiratory specimens during the acute phase of  infection. Negative results do not preclude SARS-CoV-2 infection, do not rule out co-infections with other pathogens, and should not be used as the sole basis for treatment or other patient management decisions. Negative results must be combined with clinical observations, patient history, and epidemiological information. The expected result is Negative. Fact Sheet for Patients: SugarRoll.be Fact Sheet for Healthcare Providers: https://www.woods-mathews.com/ This test is not yet approved or cleared by  the [place] FDA and  has been authorized for detection and/or diagnosis of SARS-CoV-2 by FDA under an Emergency Use Authorization (EUA). This EUA will remain  in effect (meaning this test can be used) for the duration of the COVID-19 declaration under Section 56 4(b)(1) of the Act, 21 U.S.C. section 360bbb-3(b)(1), unless the authorization is terminated or revoked sooner. Performed at [facility], [place] [address], [place], [place] [address]   Brain natriuretic peptide     Status: Abnormal   Collection Time: 03/14/19 11:59 PM  Result Value Ref Range   B Natriuretic Peptide 167.7 (H) 0.0 - 100.0 pg/mL    Comment: Performed at [facility], [place] [address], [place], [place] [address]  CBC     Status: None   Collection Time: 03/15/19  5:00 AM  Result Value Ref Range   WBC 8.3 4.0 - 10.5 K/uL   RBC 4.36 3.87 - 5.11 MIL/uL   Hemoglobin 13.4 12.0 - 15.0 g/dL   HCT 42.7 36.0 - 46.0 %   MCV 97.9 80.0 - 100.0 fL   MCH 30.7 26.0 - 34.0 pg   MCHC 31.4 30.0 - 36.0 g/dL   RDW 13.1 11.5 - 15.5 %   Platelets 230 150 - 400 K/uL   nRBC 0.0 0.0 - 0.2 %    Comment: Performed at [facility], [place] [address], [place], [place] [address]  Basic metabolic panel     Status: Abnormal   Collection Time: 03/15/19  5:00 AM  Result Value Ref Range   Sodium 132 (L) 135 - 145 mmol/L   Potassium 4.3 3.5 - 5.1 mmol/L   Chloride 91 (L) 98 - 111 mmol/L   CO2 30 22 - 32 mmol/L   Glucose, Bld 165 (H) 70 - 99 mg/dL   BUN 12 8 - 23 mg/dL   Creatinine, Ser 0.65 0.44 - 1.00 mg/dL   Calcium 8.9 8.9 - 10.3 mg/dL   GFR calc non Af Amer >60 >60 mL/min   GFR calc Af Amer >60 >60 mL/min   Anion gap 11 5 - 15    Comment: Performed at [facility], [place] [address] [place], [place] [address]   CT HEAD WO CONTRAST  Result Date: 03/14/2019 CLINICAL DATA:  Dizziness EXAM: CT HEAD WITHOUT CONTRAST TECHNIQUE: Contiguous axial images were obtained from the  base of the skull through the vertex without intravenous contrast. COMPARISON:  09/30/2015 FINDINGS: Brain: There is atrophy and chronic small vessel disease changes. No acute intracranial abnormality. Specifically, no hemorrhage, hydrocephalus, mass lesion, acute infarction, or significant intracranial injury. Arachnoid cyst seen in the right middle cranial fossa, stable. Vascular: No hyperdense vessel or unexpected calcification. Skull: No acute calvarial abnormality. Sinuses/Orbits: Visualized paranasal sinuses and mastoids clear. Orbital soft tissues unremarkable. Other: None IMPRESSION: Atrophy, chronic microvascular disease. No acute intracranial abnormality. Electronically Signed   By: Rolm Baptise M.D.   On: 03/14/2019 20:06   CT Angio Chest PE W and/or Wo Contrast  Result Date: 03/15/2019 CLINICAL DATA:  85 year old with shortness of breath and  decreased oxygen saturation. Dizziness and fatigue. EXAM: CT ANGIOGRAPHY CHEST WITH CONTRAST TECHNIQUE: Multidetector CT imaging of the chest was performed using the standard protocol during bolus administration of intravenous contrast. Multiplanar CT image reconstructions and MIPs were obtained to evaluate the vascular anatomy. CONTRAST:  93m OMNIPAQUE IOHEXOL 350 MG/ML SOLN COMPARISON:  Radiograph yesterday. FINDINGS: Cardiovascular: There are no filling defects within the pulmonary arteries to suggest pulmonary embolus. Atherosclerosis and tortuosity of the thoracic aorta. No aortic dissection. Pacemaker in place. Mild cardiomegaly. No pericardial effusion. Mediastinum/Nodes: Prominent right hilar node measuring 9 mm short axis. No enlarged mediastinal lymph nodes. No esophageal wall thickening. Lungs/Pleura: Tree in bud and reticulonodular opacities throughout both lungs, most prominent at the apices. Streaky and linear opacities in the lingula and lower lobes. Slightly consolidative opacity in the anteromedial right middle lobe, series 6 image 111. Minimal  central atelectasis in the medial left lower lobe. There is moderate breathing motion artifact which limits assessment. There is a 6 mm anterior right upper lobe pulmonary nodule, series 6, image 71. central bronchial thickening. Additionally nodular densities which appear part of the reticulonodular process. No pleural effusion. Upper Abdomen: Left adrenal thickening without dominant nodule. No acute findings. Musculoskeletal: Mild anterior wedging of T1 and T12 vertebra. Scoliotic curvature of spine. No focal bone lesion. Review of the MIP images confirms the above findings. IMPRESSION: 1. No pulmonary embolus. 2. Tree in bud and reticulonodular opacities throughout both lungs, most prominent at the apices. Findings most consistent with bronchiolitis, likely infectious or inflammatory, including atypical infections such is MAI, however distribution is not classic. Right upper lobe 6 mm pulmonary nodule may be part of the diffuse process, however recommend follow-up chest CT to evaluate for resolution/stability. Non-contrast chest CT at 3-6 months is recommended. 3. Additional streaky and slightly more confluent opacities in the lower lobes and right middle lobe, atelectasis versus pneumonia. 4. Mild anterior wedging of T1 and T12 vertebra, age indeterminate but likely chronic. Aortic Atherosclerosis (ICD10-I70.0). Electronically Signed   By: MKeith RakeM.D.   On: 03/15/2019 06:19   DG Chest Portable 1 View  Result Date: 03/14/2019 CLINICAL DATA:  Dyspnea EXAM: PORTABLE CHEST 1 VIEW COMPARISON:  10/05/2017 FINDINGS: Left-sided implanted cardiac device unchanged in positioning. Mild cardiomegaly, unchanged. Aorta is calcified. Elevation of the left hemidiaphragm. Right lung is hyperexpanded. There are coarsened interstitial markings bilaterally. No focal airspace consolidation, pleural effusion, or pneumothorax. IMPRESSION: Chronic lung changes without superimposed acute cardiopulmonary process.  Electronically Signed   By: NDavina PokeM.D.   On: 03/14/2019 21:25   CUP PACEART REMOTE DEVICE CHECK  Result Date: 03/15/2019 Scheduled remote reviewed.  Normal device function.  Estimated longevity 4 months Next remote 31 days.   Pending Labs Unresulted Labs (From admission, onward)   None      Vitals/Pain Today's Vitals   03/15/19 1916 03/15/19 1925 03/15/19 2000 03/15/19 2112  BP:   (!) 193/96 (!) 185/92  Pulse: 91  84 90  Resp: 20  16   Temp:      TempSrc:      SpO2: 95% 98% 100%   Weight:      Height:      PainSc:        Isolation Precautions No active isolations  Medications Medications  enoxaparin (LOVENOX) injection 40 mg (40 mg Subcutaneous Given 03/15/19 0003)  acetaminophen (TYLENOL) tablet 650 mg (has no administration in time range)    Or  acetaminophen (TYLENOL) suppository 650 mg (has no administration in  time range)  ondansetron (ZOFRAN) tablet 4 mg (has no administration in time range)    Or  ondansetron (ZOFRAN) injection 4 mg (has no administration in time range)  albuterol (PROVENTIL) (2.5 MG/3ML) 0.083% nebulizer solution 2.5 mg (has no administration in time range)  guaiFENesin (MUCINEX) 12 hr tablet 600 mg (600 mg Oral Not Given 03/15/19 1439)  methylPREDNISolone sodium succinate (SOLU-MEDROL) 40 mg/mL injection 40 mg (40 mg Intravenous Given 03/15/19 2112)  carvedilol (COREG) tablet 12.5 mg (12.5 mg Oral Given 03/15/19 2112)  DULoxetine (CYMBALTA) DR capsule 60 mg (60 mg Oral Not Given 03/15/19 1439)  furosemide (LASIX) tablet 20 mg (20 mg Oral Given 03/15/19 1041)  gabapentin (NEURONTIN) capsule 100-300 mg (has no administration in time range)  aspirin EC tablet 81 mg (81 mg Oral Not Given 03/15/19 1439)  amLODipine (NORVASC) tablet 10 mg (10 mg Oral Given 03/15/19 1040)  levothyroxine (SYNTHROID) tablet 112 mcg (has no administration in time range)  liothyronine (CYTOMEL) tablet 25 mcg (25 mcg Oral Given 03/15/19 1039)  oxyCODONE (Oxy  IR/ROXICODONE) immediate release tablet 15 mg (15 mg Oral Given 03/15/19 1724)  sodium chloride (PF) 0.9 % injection (has no administration in time range)  labetalol (NORMODYNE) injection 10 mg (10 mg Intravenous Given 03/15/19 1041)  ipratropium-albuterol (DUONEB) 0.5-2.5 (3) MG/3ML nebulizer solution 3 mL (3 mLs Nebulization Given 03/15/19 1925)  albuterol (VENTOLIN HFA) 108 (90 Base) MCG/ACT inhaler 4 puff (4 puffs Inhalation Given 03/14/19 2127)  methylPREDNISolone sodium succinate (SOLU-MEDROL) 125 mg/2 mL injection 125 mg (125 mg Intravenous Given 03/14/19 2252)  iohexol (OMNIPAQUE) 350 MG/ML injection 100 mL (80 mLs Intravenous Contrast Given 03/15/19 0139)    Mobility walks with person assist High fall risk   Focused Assessments Cardiac Assessment Handoff:    Lab Results  Component Value Date   CKTOTAL 218 (H) 12/15/2010   CKMB 5.0 (H) 12/15/2010   TROPONINI 0.03 03/28/2015   No results found for: DDIMER Does the Patient currently have chest pain? No  , Pulmonary Assessment Handoff:  Lung sounds: Bilateral Breath Sounds: Diminished L Breath Sounds: Clear R Breath Sounds: Clear O2 Device: Nasal Cannula O2 Flow Rate (L/min): 3 L/min      R Recommendations: See Admitting Provider Note  Report given to:   Additional Notes:

## 2019-03-15 NOTE — ED Notes (Signed)
Pt refused to be monitored on the cardiac monitor.

## 2019-03-16 MED ORDER — IPRATROPIUM-ALBUTEROL 0.5-2.5 (3) MG/3ML IN SOLN
3.0000 mL | Freq: Two times a day (BID) | RESPIRATORY_TRACT | Status: DC
  Administered 2019-03-16 – 2019-03-18 (×4): 3 mL via RESPIRATORY_TRACT
  Filled 2019-03-16 (×4): qty 3

## 2019-03-16 MED ORDER — PREDNISONE 20 MG PO TABS
40.0000 mg | ORAL_TABLET | Freq: Every day | ORAL | Status: DC
  Administered 2019-03-17 – 2019-03-18 (×2): 40 mg via ORAL
  Filled 2019-03-16 (×2): qty 2

## 2019-03-16 MED ORDER — SODIUM CHLORIDE 0.9 % IV SOLN: INTRAVENOUS | Status: AC

## 2019-03-16 MED ORDER — DOCUSATE SODIUM 100 MG PO CAPS
100.0000 mg | ORAL_CAPSULE | Freq: Every day | ORAL | Status: DC | PRN
  Administered 2019-03-17: 100 mg via ORAL
  Filled 2019-03-16: qty 1

## 2019-03-16 MED ORDER — DICLOFENAC SODIUM 1 % EX GEL
2.0000 g | Freq: Four times a day (QID) | CUTANEOUS | Status: DC
  Administered 2019-03-16 – 2019-03-18 (×9): 2 g via TOPICAL
  Filled 2019-03-16: qty 100

## 2019-03-16 NOTE — Progress Notes (Signed)
PT Cancellation Note  Patient Details Name: [name] MRN: [id number] DOB: 30-Oct-1928   Cancelled Treatment:    Reason Eval/Treat Not Completed: Other (comment)  Pt recently worked with OT and then had severe pain.  Reports pain has just eased and does not want to attempt therapy at this time. Nursing aware of episode.  Will f/u as able.  Maggie Font, PT Acute Rehab Services Pager [phone] [facility] [phone]   Karlton Lemon 03/16/2019, 1:04 PM

## 2019-03-16 NOTE — Progress Notes (Signed)
PROGRESS NOTE    [name]  [id number] DOB: 19-Oct-1928 DOA: 03/14/2019 PCP: Patient, No Pcp Per   Brief Narrative:  Patient is a 85 year old female with history of chronic combined CHF, nonischemic cardiomyopathy, status post biventricular ICD, asthma/COPD, proximal A. fib on anticoagulation, hypertension, hypothyroidism, chronic back pain who presents to the emergency department for the evaluation of dizziness, shortness of breath.  Patient lives with her daughter.  Ambulates with the help of walker/wheelchair.  Patient was admitted for the management of COPD exacerbation.  Covid 19 test negative. This morning she was complaining of some dizziness.  She worked with physical therapy occupational therapy and found to have orthostatic hypotension.  Started on gentle fluids.  Assessment & Plan:   Principal Problem:   Acute respiratory failure with hypoxia (HCC) Active Problems:   Chronic diastolic heart failure (HCC)   Hypothyroidism   COPD with acute exacerbation (HCC)   Acute respiratory failure (HCC)   COPD exacerbation (HCC)   Acute respiratory failure with hypoxia due to COPD exacerbation: Presented with wheezing, new oxygen requirement while ambulating.  Continue bronchodilators, steroids.Steroids changed to oral.  Continue supplemental oxygen as needed.  Will check if she qualifies for home oxygen.  This morning she she did not  expiratory wheezes but later on after working with the physical therapy, she developed some wheezes..  Lightheadedness/dizziness: CT head did not show any acute intracranial normalities.  PT/OT evaluation done and found to be orthostatic.  Started on gentle fluids.  Check orthostatic tomorrow morning.  Chronic combined CHF/nonischemic cardiomyopathy status post biventricular ICD: Currently euvolemic.  Follows with cardiology, Dr. Sallyanne Kuster.  Ejection  fraction of 55 to 60% with grade 1 diastolic dysfunction as per echo on 10/15/2017. She has  biventricular ICD   She is on Coreg, Lasix at home,currently on hold.  Hypertension: Mildly hypertensive.  Continue as needed meds for severe hypertension  Hypothyroidism: Continue Synthyroid  Chronic debility/osteoarthritis/neuropathy/chronic pain syndrome: Complains of  back pain.  Continue gabapentin, Cymbalta, as needed oxycodone.  PT/OT evaluation done and recommended home health.        DVT prophylaxis:Lovenox Code Status: DNR  family Communication: Called daughter today,phone not received Disposition Plan: Home with home health most  likely tomorrow   Consultants: None  Procedures: None  Antimicrobials:  Anti-infectives (From admission, onward)   None      Subjective: Patient seen and examined the bedside this morning.  Hemodynamically stable.  She feels much better today.  Respiratory status has improved.  Denies any significant dizziness today.  Eager  to go home  Objective: Vitals:   03/16/19 1006 03/16/19 1044 03/16/19 1046 03/16/19 1119  BP:  (!) 173/106 (!) 178/92 (!) 127/46  Pulse:   (!) 101 90  Resp:   20 19  Temp:   99.1 F (37.3 C) 99.3 F (37.4 C)  TempSrc:   Oral Oral  SpO2: 93%  92% 90%  Weight:      Height:       No intake or output data in the 24 hours ending 03/16/19 1126 Filed Weights   03/14/19 1838  Weight: 59 kg    Examination:  General exam: Elderly, debilitated, deconditioned HEENT:PERRL,Oral mucosa moist, Ear/Nose normal on gross exam Respiratory system: Bilateral decreased air entry Cardiovascular system: S1 & S2 heard, RRR. No JVD, murmurs, rubs, gallops or clicks. No pedal edema. Gastrointestinal system: Abdomen is nondistended, soft and nontender. No organomegaly or masses felt. Normal bowel sounds heard. Central nervous system: Alert and oriented. No  focal neurological deficits. Extremities: No edema, no clubbing ,no cyanosis Skin: No rashes, lesions or ulcers,no icterus ,no pallor      Data Reviewed: I have  personally reviewed following labs and imaging studies  CBC: Recent Labs  Lab 03/14/19 1932 03/15/19 0500  WBC 7.3 8.3  NEUTROABS 5.2  --   HGB 13.5 13.4  HCT 42.8 42.7  MCV 97.5 97.9  PLT 253 [id number]   Basic Metabolic Panel: Recent Labs  Lab 03/14/19 1932 03/15/19 0500  NA 134* 132*  K 4.2 4.3  CL 93* 91*  CO2 30 30  GLUCOSE 105* 165*  BUN 14 12  CREATININE 0.71 0.65  CALCIUM 9.0 8.9   GFR: Estimated Creatinine Clearance: 38.4 mL/min (by C-G formula based on SCr of 0.65 mg/dL). Liver Function Tests: Recent Labs  Lab 03/14/19 1932  AST 25  ALT 21  ALKPHOS 109  BILITOT 0.7  PROT 7.5  ALBUMIN 3.4*   No results for input(s): LIPASE, AMYLASE in the last 168 hours. No results for input(s): AMMONIA in the last 168 hours. Coagulation Profile: Recent Labs  Lab 03/14/19 1932  INR 0.9   Cardiac Enzymes: No results for input(s): CKTOTAL, CKMB, CKMBINDEX, TROPONINI in the last 168 hours. BNP (last 3 results) No results for input(s): PROBNP in the last 8760 hours. HbA1C: No results for input(s): HGBA1C in the last 72 hours. CBG: No results for input(s): GLUCAP in the last 168 hours. Lipid Profile: No results for input(s): CHOL, HDL, LDLCALC, TRIG, CHOLHDL, LDLDIRECT in the last 72 hours. Thyroid Function Tests: No results for input(s): TSH, T4TOTAL, FREET4, T3FREE, THYROIDAB in the last 72 hours. Anemia Panel: No results for input(s): VITAMINB12, FOLATE, FERRITIN, TIBC, IRON, RETICCTPCT in the last 72 hours. Sepsis Labs: No results for input(s): PROCALCITON, LATICACIDVEN in the last 168 hours.  Recent Results (from the past 240 hour(s))  SARS CORONAVIRUS 2 (TAT 6-24 HRS) Nasopharyngeal Nasopharyngeal Swab     Status: None   Collection Time: 03/14/19 10:29 PM   Specimen: Nasopharyngeal Swab  Result Value Ref Range Status   SARS Coronavirus 2 NEGATIVE NEGATIVE Final    Comment: (NOTE) SARS-CoV-2 target nucleic acids are NOT DETECTED. The SARS-CoV-2 RNA is  generally detectable in upper and lower respiratory specimens during the acute phase of infection. Negative results do not preclude SARS-CoV-2 infection, do not rule out co-infections with other pathogens, and should not be used as the sole basis for treatment or other patient management decisions. Negative results must be combined with clinical observations, patient history, and epidemiological information. The expected result is Negative. Fact Sheet for Patients: SugarRoll.be Fact Sheet for Healthcare Providers: https://www.woods-mathews.com/ This test is not yet approved or cleared by the [place] FDA and  has been authorized for detection and/or diagnosis of SARS-CoV-2 by FDA under an Emergency Use Authorization (EUA). This EUA will remain  in effect (meaning this test can be used) for the duration of the COVID-19 declaration under Section 56 4(b)(1) of the Act, 21 U.S.C. section 360bbb-3(b)(1), unless the authorization is terminated or revoked sooner. Performed at [facility], [place] [address], [place],  [address]          Radiology Studies: CT HEAD WO CONTRAST  Result Date: 03/14/2019 CLINICAL DATA:  Dizziness EXAM: CT HEAD WITHOUT CONTRAST TECHNIQUE: Contiguous axial images were obtained from the base of the skull through the vertex without intravenous contrast. COMPARISON:  09/30/2015 FINDINGS: Brain: There is atrophy and chronic small vessel disease changes. No acute intracranial  abnormality. Specifically, no hemorrhage, hydrocephalus, mass lesion, acute infarction, or significant intracranial injury. Arachnoid cyst seen in the right middle cranial fossa, stable. Vascular: No hyperdense vessel or unexpected calcification. Skull: No acute calvarial abnormality. Sinuses/Orbits: Visualized paranasal sinuses and mastoids clear. Orbital soft tissues unremarkable. Other: None IMPRESSION: Atrophy, chronic microvascular  disease. No acute intracranial abnormality. Electronically Signed   By: Rolm Baptise M.D.   On: 03/14/2019 20:06   CT Angio Chest PE W and/or Wo Contrast  Result Date: 03/15/2019 CLINICAL DATA:  85 year old with shortness of breath and decreased oxygen saturation. Dizziness and fatigue. EXAM: CT ANGIOGRAPHY CHEST WITH CONTRAST TECHNIQUE: Multidetector CT imaging of the chest was performed using the standard protocol during bolus administration of intravenous contrast. Multiplanar CT image reconstructions and MIPs were obtained to evaluate the vascular anatomy. CONTRAST:  41mL OMNIPAQUE IOHEXOL 350 MG/ML SOLN COMPARISON:  Radiograph yesterday. FINDINGS: Cardiovascular: There are no filling defects within the pulmonary arteries to suggest pulmonary embolus. Atherosclerosis and tortuosity of the thoracic aorta. No aortic dissection. Pacemaker in place. Mild cardiomegaly. No pericardial effusion. Mediastinum/Nodes: Prominent right hilar node measuring 9 mm short axis. No enlarged mediastinal lymph nodes. No esophageal wall thickening. Lungs/Pleura: Tree in bud and reticulonodular opacities throughout both lungs, most prominent at the apices. Streaky and linear opacities in the lingula and lower lobes. Slightly consolidative opacity in the anteromedial right middle lobe, series 6 image 111. Minimal central atelectasis in the medial left lower lobe. There is moderate breathing motion artifact which limits assessment. There is a 6 mm anterior right upper lobe pulmonary nodule, series 6, image 71. central bronchial thickening. Additionally nodular densities which appear part of the reticulonodular process. No pleural effusion. Upper Abdomen: Left adrenal thickening without dominant nodule. No acute findings. Musculoskeletal: Mild anterior wedging of T1 and T12 vertebra. Scoliotic curvature of spine. No focal bone lesion. Review of the MIP images confirms the above findings. IMPRESSION: 1. No pulmonary embolus. 2. Tree  in bud and reticulonodular opacities throughout both lungs, most prominent at the apices. Findings most consistent with bronchiolitis, likely infectious or inflammatory, including atypical infections such is MAI, however distribution is not classic. Right upper lobe 6 mm pulmonary nodule may be part of the diffuse process, however recommend follow-up chest CT to evaluate for resolution/stability. Non-contrast chest CT at 3-6 months is recommended. 3. Additional streaky and slightly more confluent opacities in the lower lobes and right middle lobe, atelectasis versus pneumonia. 4. Mild anterior wedging of T1 and T12 vertebra, age indeterminate but likely chronic. Aortic Atherosclerosis (ICD10-I70.0). Electronically Signed   By: Keith Rake M.D.   On: 03/15/2019 06:19   DG Chest Portable 1 View  Result Date: 03/14/2019 CLINICAL DATA:  Dyspnea EXAM: PORTABLE CHEST 1 VIEW COMPARISON:  10/05/2017 FINDINGS: Left-sided implanted cardiac device unchanged in positioning. Mild cardiomegaly, unchanged. Aorta is calcified. Elevation of the left hemidiaphragm. Right lung is hyperexpanded. There are coarsened interstitial markings bilaterally. No focal airspace consolidation, pleural effusion, or pneumothorax. IMPRESSION: Chronic lung changes without superimposed acute cardiopulmonary process. Electronically Signed   By: Davina Poke M.D.   On: 03/14/2019 21:25   CUP PACEART REMOTE DEVICE CHECK  Result Date: 03/15/2019 Scheduled remote reviewed.  Normal device function.  Estimated longevity 4 months Next remote 31 days.       Scheduled Meds: . aspirin EC  81 mg Oral Daily  . diclofenac Sodium  2 g Topical QID  . DULoxetine  60 mg Oral Daily  . enoxaparin (LOVENOX) injection  40 mg  Subcutaneous QHS  . guaiFENesin  600 mg Oral BID  . ipratropium-albuterol  3 mL Nebulization BID  . levothyroxine  112 mcg Oral QAC breakfast  . liothyronine  25 mcg Oral Daily  . predniSONE  40 mg Oral Q breakfast    Continuous Infusions: . sodium chloride       LOS: 1 day    Time spent:25 mins. More than 50% of that time was spent in counseling and/or coordination of care.      Shelly Coss, MD [facility] Pager [phone]  If 7PM-7AM, please contact night-coverage [email or web address] Password [email or web address] 03/16/2019, 11:26 AM

## 2019-03-16 NOTE — Evaluation (Signed)
Occupational Therapy Evaluation Patient Details Name: [name] MRN: [id number] DOB: [birth year] Today's Date: 03/16/2019    History of Present Illness 85 year old female with history of chronic combined CHF, nonischemic cardiomyopathy, status post biventricular ICD, asthma/COPD, proximal A. fib on anticoagulation, hypertension, hypothyroidism, chronic back pain who presents to the emergency department for the evaluation of dizziness, shortness of breath.   Clinical Impression   Pt admitted with the above diagnoses and presents with below problem list. Pt will benefit from continued acute OT to address the below listed deficits and maximize independence with basic ADLs prior to d/c home with family. PTA pt was mod I - supervision with ADLs, cooks meals for family. Orthostatics assessed this session with +orthostatic drop after standing for 3 minutes. Standing at 0 minutes: 154/70. Standing at 3 minutes 117/89 though pt did need to sit during final BP reading citing BLE weakness. Pt denied dizziness throughout session. After seated rest break able to side step along EOB. Pt currently min guard with LB ADLs and functional transfers.     Follow Up Recommendations  Home health OT;Supervision -24 hour   Equipment Recommendations  3 in 1 bedside commode    Recommendations for Other Services PT consult     Precautions / Restrictions Precautions Precautions: Fall      Mobility Bed Mobility Overal bed mobility: Needs Assistance Bed Mobility: Supine to Sit;Sit to Supine     Supine to sit: Min guard Sit to supine: Min guard   General bed mobility comments: min guard for safety  Transfers Overall transfer level: Needs assistance Equipment used: Rolling walker (2 wheeled) Transfers: Sit to/from Stand Sit to Stand: Min guard         General transfer comment: to/from EOB. min guard for safety. stabilized rw as pt uses rollator at baseline    Balance Overall balance assessment:  Needs assistance   Sitting balance-Leahy Scale: Fair     Standing balance support: Bilateral upper extremity supported Standing balance-Leahy Scale: Poor                             ADL either performed or assessed with clinical judgement   ADL Overall ADL's : Needs assistance/impaired Eating/Feeding: Set up;Sitting   Grooming: Set up;Sitting   Upper Body Bathing: Set up;Sitting   Lower Body Bathing: Min guard;Sit to/from stand   Upper Body Dressing : Set up;Sitting   Lower Body Dressing: Min guard;Sit to/from stand   Toilet Transfer: Min Therapist, nutritional Details (indicate cue type and reason): clinical judgement. Pt fatigued after standing 3 minutes. Toileting- Water quality scientist and Hygiene: Min guard;Sit to/from stand   Tub/ Shower Transfer: Walk-in shower;Min guard;Stand-pivot;Ambulation;Shower seat   Functional mobility during ADLs: Min guard;Rolling walker(side stepped along EOB) General ADL Comments: Focus was orthostatic reading. Pt fatigued at 3 minutes standing. Seated rest break then aside stepped along EOB.     Vision         Perception     Praxis      Pertinent Vitals/Pain Pain Assessment: No/denies pain     Hand Dominance     Extremity/Trunk Assessment Upper Extremity Assessment Upper Extremity Assessment: Generalized weakness   Lower Extremity Assessment Lower Extremity Assessment: Defer to PT evaluation       Communication Communication Communication: No difficulties   Cognition Arousal/Alertness: Awake/alert Behavior During Therapy: WFL for tasks assessed/performed Overall Cognitive Status: No family/caregiver present to determine baseline cognitive functioning  General Comments: difficulty dual tasking noted   General Comments       Exercises     Shoulder Instructions      Home Living Family/patient expects to be discharged to::  Private residence Living Arrangements: Children;Other (Comment)(adult grandchild) Available Help at Discharge: Family;Available 24 hours/day Type of Home: House Home Access: Stairs to enter [employer] of Steps: 2   Home Layout: Two level;Able to live on main level with bedroom/bathroom     Bathroom Shower/Tub: Occupational psychologist: Standard     Home Equipment: Environmental consultant - 4 wheels;Shower seat;Grab bars - tub/shower;Grab bars - toilet;Cane - single point   Additional Comments: pt reports all members of household have physical limitations. Pt reports she does the cooking for the household.       Prior Functioning/Environment Level of Independence: Independent with assistive device(s)        Comments: rollator        OT Problem List: Impaired balance (sitting and/or standing);Decreased activity tolerance;Decreased knowledge of use of DME or AE;Decreased knowledge of precautions;Cardiopulmonary status limiting activity      OT Treatment/Interventions: Self-care/ADL training;Therapeutic exercise;DME and/or AE instruction;Energy conservation;Therapeutic activities;Patient/family education;Balance training    OT Goals(Current goals can be found in the care plan section) Acute Rehab OT Goals Patient Stated Goal: home OT Goal Formulation: With patient Time For Goal Achievement: 03/30/19 Potential to Achieve Goals: Good ADL Goals Pt Will Perform Lower Body Bathing: with modified independence;sit to/from stand Pt Will Perform Lower Body Dressing: with modified independence;sit to/from stand Pt Will Transfer to Toilet: with modified independence;ambulating Pt Will Perform Toileting - Clothing Manipulation and hygiene: with modified independence;sit to/from stand Pt Will Perform Tub/Shower Transfer: Shower transfer;with supervision;ambulating;shower seat;rolling walker  OT Frequency: Min 2X/week   Barriers to D/C:            Co-evaluation               AM-PAC OT "6 Clicks" Daily Activity     Outcome Measure Help from another person eating meals?: None Help from another person taking care of personal grooming?: None Help from another person toileting, which includes using toliet, bedpan, or urinal?: A Little Help from another person bathing (including washing, rinsing, drying)?: A Little Help from another person to put on and taking off regular upper body clothing?: None Help from another person to put on and taking off regular lower body clothing?: A Little 6 Click Score: 21   End of Session Equipment Utilized During Treatment: Gait belt;Rolling walker Nurse Communication: Mobility status;Other (comment)(orthostatics)  Activity Tolerance: Patient tolerated treatment well Patient left: in bed;with call bell/phone within reach;with bed alarm set  OT Visit Diagnosis: Unsteadiness on feet (R26.81);Muscle weakness (generalized) (M62.81);Dizziness and giddiness (R42)                Time: [id number] OT Time Calculation (min): 38 min Charges:  OT General Charges $OT Visit: 1 Visit OT Evaluation $OT Eval Moderate Complexity: 1 Mod OT Treatments $Self Care/Home Management : 23-37 mins  Tyrone Schimke, OT Acute Rehabilitation Services Pager: [phone] Office: [phone]   Hortencia Pilar 03/16/2019, 10:24 AM

## 2019-03-17 MED ORDER — CARVEDILOL 12.5 MG PO TABS
12.5000 mg | ORAL_TABLET | Freq: Two times a day (BID) | ORAL | Status: DC
  Administered 2019-03-17 – 2019-03-18 (×3): 12.5 mg via ORAL
  Filled 2019-03-17 (×3): qty 1

## 2019-03-17 MED ORDER — AMOXICILLIN-POT CLAVULANATE 875-125 MG PO TABS: 1.0000 | ORAL_TABLET | Freq: Two times a day (BID) | ORAL | 0 refills | Status: AC

## 2019-03-17 MED ORDER — PREDNISONE 20 MG PO TABS: 40.0000 mg | ORAL_TABLET | Freq: Every day | ORAL | 0 refills | Status: AC

## 2019-03-17 MED ORDER — AMOXICILLIN-POT CLAVULANATE 875-125 MG PO TABS
1.0000 | ORAL_TABLET | Freq: Two times a day (BID) | ORAL | Status: DC
  Administered 2019-03-17 – 2019-03-18 (×2): 1 via ORAL
  Filled 2019-03-17 (×2): qty 1

## 2019-03-17 MED ORDER — AMLODIPINE BESYLATE 5 MG PO TABS
5.0000 mg | ORAL_TABLET | Freq: Every day | ORAL | Status: DC
  Administered 2019-03-17 – 2019-03-18 (×2): 5 mg via ORAL
  Filled 2019-03-17 (×2): qty 1

## 2019-03-17 NOTE — Progress Notes (Signed)
Have alerted MD face to face of increased BP for patient. MD has written new orders for Coreg and Norvasc. Will start as ordered.

## 2019-03-17 NOTE — Evaluation (Signed)
Physical Therapy Evaluation Patient Details Name: [name] MRN: [id number] DOB: 1928/06/15 Today's Date: 03/17/2019   History of Present Illness  Pt is  85 year old female with history of chronic CHF, nonischemic cardiomyopathy, status post biventricular ICD, asthma/COPD, proximal A. fib on anticoagulation, HTN, hypothyroidism, chronic back pain who presents to the ED for the evaluation of dizziness, shortness of breath.  Pt admitted with COPD exacerbation.  Pt  had CT Chest that was negative for PE, findings consistent with bronchiolitis, additionally atelectasis vs pne; and mild anterior wedging T1 and T2 likely chronic.  Clinical Impression  Pt admitted with above diagnosis. Pt was able to ambulate 48' with RW and transfer with min A.  She frequently needed cues for relaxation and deep breaths.  Pt did have decreased O2 on RA at rest to 86% when talking and 88% when cued for deep breaths.  On 2 LPM O2 was 96%.  Pt had episodes of orthostatic BP and dizziness yesterday per notes; however, today BP was elevated and no episode of dizziness. Pt reports lots of worry and fear of burdening daughter with increased stress. Pt currently with functional limitations due to the deficits listed below (see PT Problem List). Pt will benefit from skilled PT to increase their independence and safety with mobility to allow discharge to the venue listed below.  Required increased time for vital monitoring and cues and time for relaxation.     Follow Up Recommendations Home health PT;Supervision for mobility/OOB    Equipment Recommendations  3in1 (PT)    Recommendations for Other Services       Precautions / Restrictions Precautions Precautions: Fall Restrictions Weight Bearing Restrictions: No      Mobility  Bed Mobility Overal bed mobility: Needs Assistance Bed Mobility: Supine to Sit;Sit to Supine     Supine to sit: Min guard Sit to supine: Min guard   General bed mobility comments: cues  for safety and deep breathing and relaxation  Transfers Overall transfer level: Needs assistance Equipment used: Rolling walker (2 wheeled) Transfers: Sit to/from Stand Sit to Stand: Min guard         General transfer comment: cues for safe hand placement and deep breathing  Ambulation/Gait Ambulation/Gait assistance: Min assist Gait Distance (Feet): 40 Feet Assistive device: Rolling walker (2 wheeled) Gait Pattern/deviations: Decreased stride length;Trunk flexed;Drifts right/left Gait velocity: decreased   General Gait Details: Pt had 2 LOB with turning - reports use to a rollator and turns difficulty with current walker;  Noted pt with bil foot drop (reports baseline from neuropathy)-she was able to clear ground with toes  Stairs            Wheelchair Mobility    Modified Rankin (Stroke Patients Only)       Balance Overall balance assessment: Needs assistance Sitting-balance support: Bilateral upper extremity supported;Feet supported Sitting balance-Leahy Scale: Good     Standing balance support: Bilateral upper extremity supported;During functional activity Standing balance-Leahy Scale: Fair                               Pertinent Vitals/Pain Pain Assessment: No/denies pain   Vitals:  O2 sats: At rest on 2 LPM sats 96% At rest on RA sats dropped to 86% with talking and 88% when cued to focus on breathing Placed back on 2 LPM O2 for ambulation and sats 91% with activity.  BP and HR as Follows: Supine: 181/76 and 88 Sit 180/82  and 88 Sit after 3 minutes 177/83 and 81 Stand 180/76 and 84 Standing 3 mins 195/86 and 85 After ambulation 210/88 and 101 Return to supine 198/78 and 79 -Pt was frequently cued for relaxation, to keep arm still, and focus on breathing.  Notified RN of elevated BP and decreased O2 on RA.     Home Living Family/patient expects to be discharged to:: Private residence Living Arrangements: Children(daughter works  during day) Available Help at Discharge: Family;Available PRN/intermittently Type of Home: House Home Access: Stairs to enter Entrance Stairs-Rails: Right;Left;Can reach both Entrance Stairs-Number of Steps: 2 Home Layout: Two level;Able to live on main level with bedroom/bathroom Home Equipment: Walker - 4 wheels;Shower seat;Grab bars - tub/shower;Grab bars - toilet;Cane - single point Additional Comments: Pt reports lives with daughter and granddaughter (but granddaughter unable to assist).  Daugherter works.    Prior Function Level of Independence: Independent with assistive device(s)         Comments: rollator; pt reports able to do ADLs but was difficult     Hand Dominance        Extremity/Trunk Assessment   Upper Extremity Assessment Upper Extremity Assessment: Overall WFL for tasks assessed    Lower Extremity Assessment Lower Extremity Assessment: RLE deficits/detail;LLE deficits/detail RLE Deficits / Details: ROM WFL; MMT Hip and Knee grossly 4/5; Dorsiflexion 1/5 (baseline - peripheral neuropathy) RLE Sensation: history of peripheral neuropathy(bil feet numb) LLE Deficits / Details: ROM WFL; MMT Hip and Knee grossly 4/5; Dorsiflexion 1/5 (baseline - peripheral neuropathy) LLE Sensation: history of peripheral neuropathy    Cervical / Trunk Assessment Cervical / Trunk Assessment: Kyphotic  Communication   Communication: No difficulties  Cognition Arousal/Alertness: Awake/alert Behavior During Therapy: Anxious Overall Cognitive Status: Within Functional Limits for tasks assessed                                 General Comments: Pt frequently needing cues for relaxation and focus on task at hand.  Pt reports she is under a lot of stress and does not want to burden her daughter.      General Comments General comments (skin integrity, edema, etc.): Pt needing cues frequently for deep breaths and relaxation    Exercises     Assessment/Plan     PT Assessment Patient needs continued PT services  PT Problem List Decreased strength;Decreased mobility;Decreased safety awareness;Decreased range of motion;Decreased coordination;Decreased activity tolerance;Decreased cognition;Cardiopulmonary status limiting activity;Decreased balance;Decreased knowledge of use of DME       PT Treatment Interventions DME instruction;Therapeutic activities;Gait training;Therapeutic exercise;Patient/family education;Stair training;Balance training;Functional mobility training    PT Goals (Current goals can be found in the Care Plan section)  Acute Rehab PT Goals Patient Stated Goal: home; be as independent as possible so not to burden her daughter PT Goal Formulation: With patient Time For Goal Achievement: 03/30/19 Potential to Achieve Goals: Good    Frequency Min 3X/week   Barriers to discharge Decreased caregiver support      Co-evaluation               AM-PAC PT "6 Clicks" Mobility  Outcome Measure Help needed turning from your back to your side while in a flat bed without using bedrails?: None Help needed moving from lying on your back to sitting on the side of a flat bed without using bedrails?: None Help needed moving to and from a bed to a chair (including a wheelchair)?: A Little Help  needed standing up from a chair using your arms (e.g., wheelchair or bedside chair)?: A Little Help needed to walk in hospital room?: A Little Help needed climbing 3-5 steps with a railing? : A Lot 6 Click Score: 19    End of Session Equipment Utilized During Treatment: Gait belt Activity Tolerance: Patient tolerated treatment well Patient left: in bed;with call bell/phone within reach;with bed alarm set(no recliner in room) Nurse Communication: Mobility status(vitals) PT Visit Diagnosis: Unsteadiness on feet (R26.81);Other abnormalities of gait and mobility (R26.89);Muscle weakness (generalized) (M62.81)    Time: [id number] PT Time Calculation  (min) (ACUTE ONLY): 44 min   Charges:   PT Evaluation $PT Eval Moderate Complexity: 1 Mod PT Treatments $Therapeutic Activity: 8-22 mins        Maggie Font, PT Acute Rehab Services Pager [phone] [facility] Rehab [phone] [facility] [phone]   Karlton Lemon 03/17/2019, 2:27 PM

## 2019-03-17 NOTE — Progress Notes (Signed)
SATURATION QUALIFICATIONS: (This note is used to comply with regulatory documentation for home oxygen)  Patient Saturations on Room Air at Rest = 86%  Patient Saturations on Room Air while Ambulating = 81%  Patient Saturations on 2 Liters of oxygen while Ambulating = 94%  Please briefly explain why patient needs home oxygen: Decreased oxygen saturations, both sitting and with ambulation.

## 2019-03-17 NOTE — TOC Progression Note (Signed)
Transition of Care [facility]) - Progression Note    Patient Details  Name: [name] MRN: [id number] Date of Birth: [birth year]  Transition [facility]) CM/SW Contact  Joaquin Courts, RN Phone Number: 03/17/2019, 3:38 PM  Clinical Narrative:    [employer] arranged to deliver 3-in-1 and oxygen. CM called patient's daughter to follow-up on [facility] and left voicemail, have not received return call.    Expected Discharge Plan: [place] Barriers to Discharge: No Barriers Identified  Expected Discharge Plan and Services Expected Discharge Plan: [place]   Discharge Planning Services: CM Consult   Living arrangements for the past 2 months: Single Family Home Expected Discharge Date: 03/17/19               DME Arranged: Oxygen, 3-N-1 DME Agency: [facility] Date DME Agency Contacted: 03/17/19 Time DME Agency [id number] Representative spoke with at DME Agency: [place]) Interventions    Readmission Risk Interventions No flowsheet data found.

## 2019-03-17 NOTE — Progress Notes (Signed)
Writer attempted to call pt's daughter, again. Pt states she does not have a key to her home and that her daughter is at work. States here daughter will be home sometime tonight. Left a message on daughter's phone to call the floor as her mother has been discharged from the hospital and we need to know who will pick her up.

## 2019-03-17 NOTE — Progress Notes (Signed)
Pt's home O2 has arrived and is in pt's room. She has portable tank and an extra small tank for emergency use.

## 2019-03-17 NOTE — Progress Notes (Signed)
Daughter not answering phone.  Will await delivery of portable O2 and home delivery of O2 canister and FWW. Pt refusing home care.

## 2019-03-17 NOTE — TOC Initial Note (Signed)
Transition of Care [facility]) - Initial/Assessment Note    Patient Details  Name: [name] MRN: [id number] Date of Birth: 11-04-1928  Transition of Care [facility]) CM/SW Contact:    Trish Mage, LCSW Phone Number: 03/17/2019, 8:28 AM  Clinical Narrative:   85 YO Caucasian female here with acute respiratory failure with hypoxia, seen in follow up to OT recommendation of [place] services.  Patient lives with daughter and grandaughter for last several years since her husband died suddenly. She feels fortunate for her current situation even though money is tight, and she states she has to "go without, including certain medications sometimes."  She uses a walker at home, has a wheelchair for trips outside the home, and has a shower chair.  She is requesting 3 in 1 DME for d/c.  Ms Lamonica states her main issue is her dizziness, and she is confident once that clears up, she will be fine and will not need help.  She declines [place] services at this time.  She gave permission to call daughter, which I will do to find out if she has an opinion on this issue. TOC will continue to follow during the course of hospitalization.                  Expected Discharge Plan: [place] Barriers to Discharge: No Barriers Identified   Patient Goals and CMS Choice Patient states their goals for this hospitalization and ongoing recovery are:: "I think I will be fine."      Expected Discharge Plan and Services Expected Discharge Plan: [place]   Discharge Planning Services: CM Consult   Living arrangements for the past 2 months: Single Family Home                                      Prior Living Arrangements/Services Living arrangements for the past 2 months: Single Family Home Lives with:: Adult Children Patient language and need for interpreter reviewed:: Yes        Need for Family Participation in Patient Care: Yes (Comment) Care giver support system in place?: Yes  (comment) Current home services: DME Criminal Activity/Legal Involvement Pertinent to Current Situation/Hospitalization: No - Comment as needed  Activities of Daily Living Home Assistive Devices/Equipment: Walker (specify type), Eyeglasses, Grab bars around toilet, Grab bars in shower, Blood pressure cuff ADL Screening (condition at time of admission) Patient's cognitive ability adequate to safely complete daily activities?: Yes Is the patient deaf or have difficulty hearing?: No Does the patient have difficulty seeing, even when wearing glasses/contacts?: No Does the patient have difficulty concentrating, remembering, or making decisions?: No Patient able to express need for assistance with ADLs?: Yes Does the patient have difficulty dressing or bathing?: Yes Independently performs ADLs?: No Communication: Independent Dressing (OT): Needs assistance Is this a change from baseline?: Pre-admission baseline Grooming: Needs assistance Is this a change from baseline?: Pre-admission baseline Feeding: Independent Bathing: Needs assistance Is this a change from baseline?: Pre-admission baseline Toileting: Needs assistance Is this a change from baseline?: Pre-admission baseline In/Out Bed: Needs assistance Is this a change from baseline?: Pre-admission baseline Walks in Home: Independent with device (comment)(front wheel walker) Does the patient have difficulty walking or climbing stairs?: Yes Weakness of Legs: Both Weakness of Arms/Hands: None  Permission Sought/Granted Permission sought to share information with : Family Supports Permission granted to share information with :  Yes, Verbal Permission Granted  Share Information with NAME: Liana Gerold     Permission granted to share info w Relationship: daughter  Permission granted to share info w Contact Information: [phone]  Emotional Assessment Appearance:: Appears stated age Attitude/Demeanor/Rapport: Engaged Affect  (typically observed): Appropriate Orientation: : Oriented to Self, Oriented to Place, Oriented to  Time, Oriented to Situation Alcohol / Substance Use: Not Applicable Psych Involvement: No (comment)  Admission diagnosis:  Acute respiratory failure (HCC) [J96.00] Hypoxia [R09.02] COPD exacerbation (HCC) [J44.1] COPD with acute exacerbation (HCC) [J44.1] Chronic obstructive pulmonary disease, unspecified COPD type ([place]) [J44.9] Patient Active Problem List   Diagnosis Date Noted  . Acute respiratory failure ([place]) 03/15/2019  . COPD exacerbation ([place]) 03/15/2019  . Acute respiratory failure with hypoxia ([place]) 03/14/2019  . COPD with acute exacerbation ([place]) 03/14/2019  . Digoxin toxicity 10/04/2017  . Closed fracture of distal end of radius 06/29/2017  . Acute encephalopathy   . AKI (acute kidney injury) ([place])   . Dysarthria 09/29/2015  . Abnormal liver function 09/29/2015  . Acute renal failure (ARF) ([place]) 09/29/2015  . Hyponatremia 09/29/2015  . Small bowel obstruction ([place]) 05/17/2015  . Hypothyroidism 05/17/2015  . Uncontrolled hypertension 05/17/2015  . Hypocalcemia 05/17/2015  . Leukocytosis 05/17/2015  . Paroxysmal atrial flutter ([place]) 04/24/2015  . Incarcerated incisional hernia 03/28/2015  . SBO (small bowel obstruction) ([place]) 03/28/2015  . Nonischemic cardiomyopathy ([place]) 11/27/2012  . Biventricular ICD  11/27/2012  . Chronic diastolic heart failure ([place]) 11/27/2012  . Peripheral neuropathy secondary to chemotherapy 11/27/2012  . Hyperlipidemia 11/27/2012  . COPD (chronic obstructive pulmonary disease) ([place]) 12/03/2011  . DOE (dyspnea on exertion) 11/05/2011   PCP:  Patient, No Pcp Per Pharmacy:   [id number] [place], [address] [place] [address] Phone: [phone] Fax: [phone]  [facility] [place], [place] [address] [place]  [address] Phone: [phone] Fax: [phone]     Social Determinants of Health (SDOH) Interventions    Readmission Risk Interventions No flowsheet data found.

## 2019-03-17 NOTE — Progress Notes (Signed)
Writer paged MD to alert of daughter not calling staff back. MD stated pt would just stay tonight.

## 2019-03-17 NOTE — Discharge Summary (Addendum)
Physician Discharge Summary  [name] [id number] DOB: March 18, 1929 DOA: 03/14/2019  PCP: Patient, No Pcp Per  Admit date: 03/14/2019 Discharge date: 03/17/2019  Admitted From: Home Disposition:  Home  Discharge Condition:Stable CODE STATUS: DNR Diet recommendation: Heart Healthy  Brief/Interim Summary:  Patient is a 85 year old female with history of chronic combined CHF, nonischemic cardiomyopathy, status post biventricular ICD, asthma/COPD, proximal A. fib on anticoagulation, hypertension, hypothyroidism, chronic back pain who presents to the emergency department for the evaluation of dizziness, shortness of breath.  Patient lives with her daughter.  Ambulates with the help of walker/wheelchair.  Patient was admitted for the management of COPD exacerbation.  Covid 19 test negative. Patient  was complaining of some dizziness.  She worked with physical therapy occupational therapy and found to have orthostatic hypotension.  Started on gentle fluids.  This morning her blood pressure was up.  Dizziness has improved.  She qualified for home oxygen.  She is hemodynamically stable for discharge today to home.  Home health ordered.  Following problems were addressed during her hospitalization:   Acute respiratory failure with hypoxia due to COPD exacerbation/bronchiolitis/atypical pneumonia: Presented with wheezing, new oxygen requirement while ambulating.  Started on  bronchodilators, steroids.Steroids changed to oral.   She qualified for home oxygen.  Continue steroids for few more days.  Started on antibiotics.  Respiratory status currently stable. She has H/O asthma/COPD .She was wheezing on presentation.  She is on albuterol inhaler at home.  Lightheadedness/dizziness: CT head did not show any acute intracranial normalities.  PT/OT evaluation done and found to be orthostatic.  Started on gentle fluids.    She is hypertensive this morning.  Chronic combined CHF/nonischemic  cardiomyopathy status post biventricular ICD: Currently euvolemic.  Follows with cardiology, Dr. Sallyanne Kuster.  Ejection  fraction of 55 to 60% with grade 1 diastolic dysfunction as per echo on 10/15/2017. She has biventricular ICD   She is on Coreg, Lasix at home.  Hypertension: Hypertensive.  resume home meds.  Hypothyroidism: Continue Synthyroid  Chronic debility/osteoarthritis/neuropathy/chronic pain syndrome: Complains of  back pain.  Home regimen include  gabapentin, Cymbalta, as needed oxycodone.  PT/OT evaluation done and recommended home health.    Discharge Diagnoses:  Principal Problem:   Acute respiratory failure with hypoxia (HCC) Active Problems:   Chronic diastolic heart failure (HCC)   Hypothyroidism   COPD with acute exacerbation (HCC)   Acute respiratory failure (HCC)   COPD exacerbation (HCC)    Discharge Instructions  Discharge Instructions    Diet - low sodium heart healthy   Complete by: As directed    Discharge instructions   Complete by: As directed    1)Please take prescribed medications as instructed. 2)Follow up with your PCP in a week. 3)Please be careful on getting  up from the bed or chair.Dont get up abruptly.   Increase activity slowly   Complete by: As directed      Allergies as of 03/17/2019   No Known Allergies     Medication List    TAKE these medications   albuterol 108 (90 Base) MCG/ACT inhaler Commonly known as: VENTOLIN HFA Inhale 1-2 puffs into the lungs every 6 (six) hours as needed for wheezing or shortness of breath.   alendronate 70 MG tablet Commonly known as: FOSAMAX Take 70 mg by mouth every Sunday.   amLODipine 5 MG tablet Commonly known as: NORVASC TAKE 1 TABLET BY MOUTH EVERY DAY   amoxicillin-clavulanate 875-125 MG tablet Commonly known as: Augmentin Take 1 tablet by  mouth 2 (two) times daily for 5 days.   aspirin EC 81 MG tablet Take 1 tablet (81 mg total) by mouth daily.   carvedilol 12.5 MG  tablet Commonly known as: COREG TAKE 1 TABLET BY MOUTH 2 TIMES DAILY WITH A MEAL. PLEASE MAKE APPT WITH DR. Caryl Comes FOR SEPTEMBER What changed: See the new instructions.   DULoxetine 60 MG capsule Commonly known as: CYMBALTA Take 60 mg by mouth daily.   furosemide 20 MG tablet Commonly known as: LASIX TAKE 1 TABLET BY MOUTH EVERY DAY   gabapentin 100 MG capsule Commonly known as: NEURONTIN Take 100-300 mg by mouth at bedtime.   liothyronine 25 MCG tablet Commonly known as: CYTOMEL Take 25 mcg by mouth daily.   Melatonin 10 MG Tabs Take 10 mg by mouth at bedtime.   multivitamin with minerals Tabs tablet Take 1 tablet by mouth daily.   oxyCODONE 15 MG immediate release tablet Commonly known as: ROXICODONE Take 15 mg by mouth every 6 (six) hours.   predniSONE 20 MG tablet Commonly known as: DELTASONE Take 2 tablets (40 mg total) by mouth daily with breakfast for 3 days. Start taking on: March 18, 2019   Synthroid 112 MCG tablet Generic drug: levothyroxine Take 112 mcg by mouth daily before breakfast.   VITAMIN D3 PO Take 1 tablet by mouth daily.            Durable Medical Equipment  (From admission, onward)         Start     Ordered   03/17/19 1420  For home use only DME 3 n 1  Once     03/17/19 1420   03/17/19 1407  For home use only DME oxygen  Once    Question Answer Comment  Length of Need Lifetime   Mode or (Route) Nasal cannula   Liters per Minute 2   Oxygen delivery system Gas      03/17/19 1409         Follow-up Information    Croitoru, Mihai, MD. Schedule an appointment as soon as possible for a visit in 1 week(s).   Specialty: Cardiology Contact information: [address] [place] [address] [phone]          No Known Allergies  Consultations:  None   Procedures/Studies: CT HEAD WO CONTRAST  Result Date: 03/14/2019 CLINICAL DATA:  Dizziness EXAM: CT HEAD WITHOUT CONTRAST TECHNIQUE: Contiguous axial  images were obtained from the base of the skull through the vertex without intravenous contrast. COMPARISON:  09/30/2015 FINDINGS: Brain: There is atrophy and chronic small vessel disease changes. No acute intracranial abnormality. Specifically, no hemorrhage, hydrocephalus, mass lesion, acute infarction, or significant intracranial injury. Arachnoid cyst seen in the right middle cranial fossa, stable. Vascular: No hyperdense vessel or unexpected calcification. Skull: No acute calvarial abnormality. Sinuses/Orbits: Visualized paranasal sinuses and mastoids clear. Orbital soft tissues unremarkable. Other: None IMPRESSION: Atrophy, chronic microvascular disease. No acute intracranial abnormality. Electronically Signed   By: Rolm Baptise M.D.   On: 03/14/2019 20:06   CT Angio Chest PE W and/or Wo Contrast  Result Date: 03/15/2019 CLINICAL DATA:  85 year old with shortness of breath and decreased oxygen saturation. Dizziness and fatigue. EXAM: CT ANGIOGRAPHY CHEST WITH CONTRAST TECHNIQUE: Multidetector CT imaging of the chest was performed using the standard protocol during bolus administration of intravenous contrast. Multiplanar CT image reconstructions and MIPs were obtained to evaluate the vascular anatomy. CONTRAST:  24mL OMNIPAQUE IOHEXOL 350 MG/ML SOLN COMPARISON:  Radiograph yesterday.  FINDINGS: Cardiovascular: There are no filling defects within the pulmonary arteries to suggest pulmonary embolus. Atherosclerosis and tortuosity of the thoracic aorta. No aortic dissection. Pacemaker in place. Mild cardiomegaly. No pericardial effusion. Mediastinum/Nodes: Prominent right hilar node measuring 9 mm short axis. No enlarged mediastinal lymph nodes. No esophageal wall thickening. Lungs/Pleura: Tree in bud and reticulonodular opacities throughout both lungs, most prominent at the apices. Streaky and linear opacities in the lingula and lower lobes. Slightly consolidative opacity in the anteromedial right middle  lobe, series 6 image 111. Minimal central atelectasis in the medial left lower lobe. There is moderate breathing motion artifact which limits assessment. There is a 6 mm anterior right upper lobe pulmonary nodule, series 6, image 71. central bronchial thickening. Additionally nodular densities which appear part of the reticulonodular process. No pleural effusion. Upper Abdomen: Left adrenal thickening without dominant nodule. No acute findings. Musculoskeletal: Mild anterior wedging of T1 and T12 vertebra. Scoliotic curvature of spine. No focal bone lesion. Review of the MIP images confirms the above findings. IMPRESSION: 1. No pulmonary embolus. 2. Tree in bud and reticulonodular opacities throughout both lungs, most prominent at the apices. Findings most consistent with bronchiolitis, likely infectious or inflammatory, including atypical infections such is MAI, however distribution is not classic. Right upper lobe 6 mm pulmonary nodule may be part of the diffuse process, however recommend follow-up chest CT to evaluate for resolution/stability. Non-contrast chest CT at 3-6 months is recommended. 3. Additional streaky and slightly more confluent opacities in the lower lobes and right middle lobe, atelectasis versus pneumonia. 4. Mild anterior wedging of T1 and T12 vertebra, age indeterminate but likely chronic. Aortic Atherosclerosis (ICD10-I70.0). Electronically Signed   By: Keith Rake M.D.   On: 03/15/2019 06:19   DG Chest Portable 1 View  Result Date: 03/14/2019 CLINICAL DATA:  Dyspnea EXAM: PORTABLE CHEST 1 VIEW COMPARISON:  10/05/2017 FINDINGS: Left-sided implanted cardiac device unchanged in positioning. Mild cardiomegaly, unchanged. Aorta is calcified. Elevation of the left hemidiaphragm. Right lung is hyperexpanded. There are coarsened interstitial markings bilaterally. No focal airspace consolidation, pleural effusion, or pneumothorax. IMPRESSION: Chronic lung changes without superimposed acute  cardiopulmonary process. Electronically Signed   By: Davina Poke M.D.   On: 03/14/2019 21:25   CUP PACEART REMOTE DEVICE CHECK  Result Date: 03/15/2019 Scheduled remote reviewed.  Normal device function.  Estimated longevity 4 months Next remote 31 days.      Subjective:  Patient seen and examined at the bedside this morning.  Hemodynamically stable for discharge today. Discharge Exam: Vitals:   03/17/19 1250 03/17/19 1354  BP:  (!) 166/93  Pulse:  71  Resp:  18  Temp:  97.7 F (36.5 C)  SpO2: 91% 96%   Vitals:   03/17/19 1051 03/17/19 1221 03/17/19 1250 03/17/19 1354  BP:    (!) 166/93  Pulse:    71  Resp:    18  Temp:    97.7 F (36.5 C)  TempSrc:    Oral  SpO2: 94% (!) 86% 91% 96%  Weight:      Height:        General: Pt is alert, awake, not in acute distress Cardiovascular: RRR, S1/S2 +, no rubs, no gallops Respiratory: CTA bilaterally, no wheezing, no rhonchi Abdominal: Soft, NT, ND, bowel sounds + Extremities: no edema, no cyanosis    The results of significant diagnostics from this hospitalization (including imaging, microbiology, ancillary and laboratory) are listed below for reference.     Microbiology: Recent Results (from the  past 240 hour(s))  SARS CORONAVIRUS 2 (TAT 6-24 HRS) Nasopharyngeal Nasopharyngeal Swab     Status: None   Collection Time: 03/14/19 10:29 PM   Specimen: Nasopharyngeal Swab  Result Value Ref Range Status   SARS Coronavirus 2 NEGATIVE NEGATIVE Final    Comment: (NOTE) SARS-CoV-2 target nucleic acids are NOT DETECTED. The SARS-CoV-2 RNA is generally detectable in upper and lower respiratory specimens during the acute phase of infection. Negative results do not preclude SARS-CoV-2 infection, do not rule out co-infections with other pathogens, and should not be used as the sole basis for treatment or other patient management decisions. Negative results must be combined with clinical observations, patient history, and  epidemiological information. The expected result is Negative. Fact Sheet for Patients: SugarRoll.be Fact Sheet for Healthcare Providers: https://www.woods-mathews.com/ This test is not yet approved or cleared by the [place] FDA and  has been authorized for detection and/or diagnosis of SARS-CoV-2 by FDA under an Emergency Use Authorization (EUA). This EUA will remain  in effect (meaning this test can be used) for the duration of the COVID-19 declaration under Section 56 4(b)(1) of the Act, 21 U.S.C. section 360bbb-3(b)(1), unless the authorization is terminated or revoked sooner. Performed at [facility], [place] [address], [place], [place] [address]      Labs: BNP (last 3 results) Recent Labs    03/14/19 2359  BNP [id number]*   Basic Metabolic Panel: Recent Labs  Lab 03/14/19 1932 03/15/19 0500  NA 134* 132*  K 4.2 4.3  CL 93* 91*  CO2 30 30  GLUCOSE 105* 165*  BUN 14 12  CREATININE 0.71 0.65  CALCIUM 9.0 8.9   Liver Function Tests: Recent Labs  Lab 03/14/19 1932  AST 25  ALT 21  ALKPHOS 109  BILITOT 0.7  PROT 7.5  ALBUMIN 3.4*   No results for input(s): LIPASE, AMYLASE in the last 168 hours. No results for input(s): AMMONIA in the last 168 hours. CBC: Recent Labs  Lab 03/14/19 1932 03/15/19 0500  WBC 7.3 8.3  NEUTROABS 5.2  --   HGB 13.5 13.4  HCT 42.8 42.7  MCV 97.5 97.9  PLT 253 230   Cardiac Enzymes: No results for input(s): CKTOTAL, CKMB, CKMBINDEX, TROPONINI in the last 168 hours. BNP: Invalid input(s): POCBNP CBG: No results for input(s): GLUCAP in the last 168 hours. D-Dimer No results for input(s): DDIMER in the last 72 hours. Hgb A1c No results for input(s): HGBA1C in the last 72 hours. Lipid Profile No results for input(s): CHOL, HDL, LDLCALC, TRIG, CHOLHDL, LDLDIRECT in the last 72 hours. Thyroid function studies No results for input(s): TSH, T4TOTAL, T3FREE, THYROIDAB in the last 72  hours.  Invalid input(s): FREET3 Anemia work up No results for input(s): VITAMINB12, FOLATE, FERRITIN, TIBC, IRON, RETICCTPCT in the last 72 hours. Urinalysis    Component Value Date/Time   COLORURINE YELLOW 03/14/2019 1926   APPEARANCEUR CLEAR 03/14/2019 1926   LABSPEC 1.008 03/14/2019 [place] 7.0 03/14/2019 1926   GLUCOSEU NEGATIVE 03/14/2019 [place] NEGATIVE 03/14/2019 1926   BILIRUBINUR NEGATIVE 03/14/2019 1926   KETONESUR NEGATIVE 03/14/2019 1926   PROTEINUR NEGATIVE 03/14/2019 1926   NITRITE NEGATIVE 03/14/2019 1926   LEUKOCYTESUR SMALL (A) 03/14/2019 1926   Sepsis Labs Invalid input(s): PROCALCITONIN,  WBC,  LACTICIDVEN Microbiology Recent Results (from the past 240 hour(s))  SARS CORONAVIRUS 2 (TAT 6-24 HRS) Nasopharyngeal Nasopharyngeal Swab     Status: None   Collection Time: 03/14/19 10:29 PM  Specimen: Nasopharyngeal Swab  Result Value Ref Range Status   SARS Coronavirus 2 NEGATIVE NEGATIVE Final    Comment: (NOTE) SARS-CoV-2 target nucleic acids are NOT DETECTED. The SARS-CoV-2 RNA is generally detectable in upper and lower respiratory specimens during the acute phase of infection. Negative results do not preclude SARS-CoV-2 infection, do not rule out co-infections with other pathogens, and should not be used as the sole basis for treatment or other patient management decisions. Negative results must be combined with clinical observations, patient history, and epidemiological information. The expected result is Negative. Fact Sheet for Patients: SugarRoll.be Fact Sheet for Healthcare Providers: https://www.woods-mathews.com/ This test is not yet approved or cleared by the [place] FDA and  has been authorized for detection and/or diagnosis of SARS-CoV-2 by FDA under an Emergency Use Authorization (EUA). This EUA will remain  in effect (meaning this test can be used) for the duration of the COVID-19  declaration under Section 56 4(b)(1) of the Act, 21 U.S.C. section 360bbb-3(b)(1), unless the authorization is terminated or revoked sooner. Performed at [facility], [place] [address] [place], [place] [address]     Please note: You were cared for by a hospitalist during your hospital stay. Once you are discharged, your primary care physician will handle any further medical issues. Please note that NO REFILLS for any discharge medications will be authorized once you are discharged, as it is imperative that you return to your primary care physician (or establish a relationship with a primary care physician if you do not have one) for your post hospital discharge needs so that they can reassess your need for medications and monitor your lab values.    Time coordinating discharge: 40 minutes  SIGNED:   Shelly Coss, MD  [facility] 03/17/2019, 2:20 PM Pager [phone]  If 7PM-7AM, please contact night-coverage [email or web address] Password [email or web address]

## 2019-03-18 NOTE — Progress Notes (Signed)
Pt leaving this evening with her daughter, who is picking her up at door of hospital. Pt leaving with her portable O2 tank and tubing. Her emergency tank on carrier. Glasses, book, bag, clothes. Discharge instructions given/explained with pt verbalizing understanding. Instructions discussed with pt's daughter, who picked her up from hospital. Both are aware of followup.

## 2019-03-18 NOTE — Progress Notes (Addendum)
Patient seen and examined at the bedside this morning.  No significant changes from yesterday.  Patient has been already discharged, discharge orders and summary on place.  She could not be discharged yesterday because her daughter cannot pick her up.  She was having breathing treatment this morning.    She  is hemodynamically stable for discharge to home with hospice as soon as possible.  She denies any complaints. I called her daughter again this morning,call not received.

## 2019-03-18 NOTE — Progress Notes (Signed)
Occupational Therapy Treatment Patient Details Name: [name] MRN: [id number] DOB: 1928-09-13 Today's Date: 03/18/2019    History of present illness Pt is  85 year old female with history of chronic CHF, nonischemic cardiomyopathy, status post biventricular ICD, asthma/COPD, proximal A. fib on anticoagulation, HTN, hypothyroidism, chronic back pain who presents to the ED for the evaluation of dizziness, shortness of breath.  Pt admitted with COPD exacerbation.  Pt  had CT Chest that was negative for PE, findings consistent with bronchiolitis, additionally atelectasis vs pne; and mild anterior wedging T1 and T2 likely chronic.   OT comments  Discharge recommendation updated this session due to pt's current level of assistance and limited support available at home. Pt reports her daughter works during the day and her granddaughter is sick and unable to assist her. Pt currently requires minA-modA for functional mobility at RW level. Pt had significant loss of balance during stand-pivot from edge of bed to bedside commode. She required physical assistance from therapist to correct. Pt required max multimodal cueing for safe hand placement and safe use of DME. Per RN, pt declined education on proper use of home oxygen. Pt is at a significant risk for falling based on current level of functioning. Discussed safety concerns with pt and benefits of sub-acute therapy services, but pt declined. Educated pt on energy conservation strategies and fall prevention strategies with provided handouts. Currently recommend SNF at d/c. Will continue to follow acutely.   **pt negative for orthostatics this session. See vitals   SpO2 90%-94% 2lnc throughout session.    Follow Up Recommendations  SNF;Supervision/Assistance - 24 hour    Equipment Recommendations  3 in 1 bedside commode    Recommendations for Other Services      Precautions / Restrictions Precautions Precautions: Fall Restrictions Weight  Bearing Restrictions: No       Mobility Bed Mobility Overal bed mobility: Needs Assistance Bed Mobility: Supine to Sit;Sit to Supine     Supine to sit: Min guard Sit to supine: Min guard   General bed mobility comments: min guard for safety  Transfers Overall transfer level: Needs assistance Equipment used: Rolling walker (2 wheeled) Transfers: Sit to/from [facility] Sit to Stand: Min assist Stand pivot transfers: Min assist;Mod assist       General transfer comment: minA for powerup and cues for safe hand placement;1x significant lob with stand-pivot to [facility] required moderate assistance to correct    Balance Overall balance assessment: Needs assistance Sitting-balance support: No upper extremity supported;Feet supported Sitting balance-Leahy Scale: Fair     Standing balance support: Bilateral upper extremity supported;During functional activity Standing balance-Leahy Scale: Poor Standing balance comment: very unstable during stand-pivot, poor safety awareness                            ADL either performed or assessed with clinical judgement   ADL Overall ADL's : Needs assistance/impaired                     Lower Body Dressing: Minimal assistance;Sit to/from stand   Toilet Transfer: Minimal assistance;Moderate assistance;Cueing for safety;Stand-pivot;BSC Toilet Transfer Details (indicate cue type and reason): stand pivot from EOB to BSC;pt with significant loss of balance, required moderate assistance from therapist to correct Toileting- Clothing Manipulation and Hygiene: Minimal assistance;Sit to/from stand;Cueing for safety Toileting - Clothing Manipulation Details (indicate cue type and reason): consistent cues for safe hand placement     Functional mobility during  ADLs: Minimal assistance;Moderate assistance;Rolling walker General ADL Comments: pt with increased risk for falling;decreased awareness of deficits and of  safety;required max multimodal cues for safe hand placement and safe use of DME;provided energy conservation strategy handout and reviewed with pt;     Vision       Perception     Praxis      Cognition Arousal/Alertness: Awake/alert Behavior During Therapy: Anxious Overall Cognitive Status: Within Functional Limits for tasks assessed                                 General Comments: decreased awareness of deficits, decreased safety awareness, per RN pt declined education on use of home O2;pt with increased fear of falling;reported multiple times, home environment is stressful and she does nto want to burden daughter;pt reporting how when she returns home she wants to make a big meal for family        Exercises     Shoulder Instructions       General Comments thoroughly discussed safety concerns and pt's increased risk for falling;pt continued to decline sub-acute therapies prior to returning home, fixated on returning home to "take a shower" and "have personal belongings"    Pertinent Vitals/ Pain       Pain Assessment: No/denies pain  Home Living                                          Prior Functioning/Environment              Frequency  Min 2X/week        Progress Toward Goals  OT Goals(current goals can now be found in the care plan section)  Progress towards OT goals: Progressing toward goals  Acute Rehab OT Goals Patient Stated Goal: to be home and take a hot shower OT Goal Formulation: With patient Time For Goal Achievement: 03/30/19 Potential to Achieve Goals: Good ADL Goals Pt Will Perform Lower Body Bathing: with modified independence;sit to/from stand Pt Will Perform Lower Body Dressing: with modified independence;sit to/from stand Pt Will Transfer to Toilet: with modified independence;ambulating Pt Will Perform Toileting - Clothing Manipulation and hygiene: with modified independence;sit to/from stand Pt Will  Perform Tub/Shower Transfer: Shower transfer;with supervision;ambulating;shower seat;rolling walker  Plan Discharge plan needs to be updated    Co-evaluation                 AM-PAC OT "6 Clicks" Daily Activity     Outcome Measure   Help from another person eating meals?: None Help from another person taking care of personal grooming?: A Little Help from another person toileting, which includes using toliet, bedpan, or urinal?: A Lot Help from another person bathing (including washing, rinsing, drying)?: A Lot Help from another person to put on and taking off regular upper body clothing?: A Little Help from another person to put on and taking off regular lower body clothing?: A Lot 6 Click Score: 16    End of Session Equipment Utilized During Treatment: Gait belt;Rolling walker;Oxygen  OT Visit Diagnosis: Unsteadiness on feet (R26.81);Muscle weakness (generalized) (M62.81);Dizziness and giddiness (R42)   Activity Tolerance Patient tolerated treatment well   Patient Left in bed;with call bell/phone within reach;with bed alarm set   Nurse Communication Mobility status        Time: [id number] OT  Time Calculation (min): 38 min  Charges: OT General Charges $OT Visit: 1 Visit OT Treatments $Self Care/Home Management : 38-52 mins  Dorinda Hill OTR/L [place] Office: [place] 03/18/2019, 10:49 AM

## 2019-03-18 NOTE — TOC Progression Note (Signed)
Transition of Care [facility]) - Progression Note    Patient Details  Name: [name] MRN: [id number] Date of Birth: 1928-04-15  Transition of Care [facility]) CM/SW Contact  Joaquin Courts, RN Phone Number: 03/18/2019, 12:45 PM  Clinical Narrative:    CM following up on patient who was not able to dc 03/18/19 due to daughter never responded/called back despite numerous attempts by [facility] and bedside RN.  CM called listed cell phone number and home number unsuccessfully.   Patient reports her daughter works at [employer] in [place], [place] called the grocery store and was able to speak with daughter dawn Bryson Ha who is at work today.  Daughter reports that she is able to pick up her mother from the hospital today when she gets off work at 75 pm. C discussed with daughter the need for O2 concentrator in the home which will need to be delivered by the DME company.  Daughter states she can take delivery this evening after work and be able to get her mother after concentrator is in the home.  Daughter also expresses that she is agreeable to [facility] services as long as insurance pays for them.  CM explained that there is an order for  [facility] services and that it is a service covered by insurance.  CM also spoke with the patient and explained this to her as well. Patient and daughter are both agreeable to [facility] services, both decline SNF placement. [facility] given referral for HHPT/OT services.  CM reached out to [employer] rep who states patient provided with portable O2 concentrator which will provide O2 until stationary concentrator can be delivered on Monday or Tuesday, no Sunday delivery is available. CM reached out to bedside RN and notified her of this. CM left voicemail on daughters phone explaining change in plans and that no delivery will be made today but her mother has the portable concentrator which will supply the oxygen she needs to go home today. Patient is set and safe to discharge this evening when  daughter gets off work and can pick up the patient.     Expected Discharge Plan: [place] Barriers to Discharge: No Barriers Identified  Expected Discharge Plan and Services Expected Discharge Plan: [place]   Discharge Planning Services: CM Consult   Living arrangements for the past 2 months: Single Family Home Expected Discharge Date: 03/17/19               DME Arranged: Oxygen, 3-N-1 DME Agency: [employer] Date DME Agency Contacted: 03/17/19 Time DME Agency Contacted: [id number] Representative spoke with at DME Agency: [place]: PT, OT [place] Agency: [place] ([place]) Date [place]: 03/18/19 Time [place]: 1245 Representative spoke with at [place]: [place] ([place]) Interventions    Readmission Risk Interventions No flowsheet data found.

## 2019-03-21 DIAGNOSIS — G894 Chronic pain syndrome: Secondary | ICD-10-CM | POA: Diagnosis not present

## 2019-03-21 DIAGNOSIS — J441 Chronic obstructive pulmonary disease with (acute) exacerbation: Secondary | ICD-10-CM | POA: Diagnosis not present

## 2019-03-21 DIAGNOSIS — Z09 Encounter for follow-up examination after completed treatment for conditions other than malignant neoplasm: Secondary | ICD-10-CM | POA: Diagnosis not present

## 2019-04-16 NOTE — Progress Notes (Signed)
Cardiology Office Note:    Date:  04/17/2019   ID:  [name], DOB 12/11/1928, MRN [id number]  PCP:  Camille Bal, PA-C  Cardiologist:  Virl Axe, MD , Dr. Sallyanne Kuster  Referring MD: No ref. provider found   Chief Complaint  Patient presents with  . Follow-up    dyspnea    History of Present Illness:    [name] is a 86 y.o. female with NICM s/p BiV ICD/CRT-D (05/2007, gen change 2015), chronic diastolic heart failure, EF has been as low as 20%, now normalized since synchronized pacing, paroxysmal atrial flutter (one brief occurrence, no AC), COPD, normal coronaries by heart cath in 1992, and hypertension. Normal myoview 05/2007. She is maintained on 20 mg lasix daily. Losartan was discontinued in 12/2018 due to hyperkalemia with K of 6.1. She has a history of chronic dyspnea. I saw her via telehealth visit on 02/07/19. She reported DOE and was using her PRN inhaler several times daily. I advised that she needed to see PCP to adjust her COPD regimen. She was also not taking coreg bc she was waiting on a check to pick up her medication. She canceled her follow up appt and unfortunately was hospitalized 03/14/19-03/17/19 for fatigue, dizziness, and found to be hypoxic.  She was started on scheduled duo nebs, IV Solu-Medrol, and [place] O2.  Covid-19 was negative. CT head negative for acute intracranial abnormality.  She was noted to be euvolemic on exam.  She was discharged on p.o. steroids, bronchodilators, and antibiotics.  She presents today for follow up of her hospitalization, BP, and hypoxia. She states she was never told to see her PCP for titration of her inhalers. It appears she is still not on a long-acting inhaler for her COPD. She felt much better after completing the steroids and ABX from the ER. Fortunately, I do not appreciate a CHF exacerbation after steroids. Her daughter is with her and states they can't figure out how to work the blood pressure machine at home. She also  complains about dry skin. I recommended humidification at home and with her O2. She has been out of her norvasc. They also report very high copays for their medications. I used the GoodRx app to find them $4 cost for norvasc, as an example, but the daughter states she is not willing to drive to the [facility] due to gas cost.    Past Medical History:  Diagnosis Date  . AICD (automatic cardioverter/defibrillator) present 06/27/2007   medtronic  . ARF (acute renal failure) ([place]) 09/2017  . Cancer ([place])    ovarian--stage 4  . Cardiomyopathy ([place])   . CHF (congestive heart failure) ([place])   . Dyspnea   . Peripheral neuropathy   . Systemic hypertension   . Thyroid disease     Past Surgical History:  Procedure Laterality Date  . ABDOMINAL HYSTERECTOMY  oct 2000   along with ovarian ca surg  . APPENDECTOMY    . CARDIAC CATHETERIZATION  09/27/1990   normal coronaries,dilated cardiomyopathy  . CARDIAC DEFIBRILLATOR PLACEMENT  06/27/2007   medtronic concerto  . CHOLECYSTECTOMY    . IMPLANTABLE CARDIOVERTER DEFIBRILLATOR GENERATOR CHANGE N/A 07/31/2013   Procedure: IMPLANTABLE CARDIOVERTER DEFIBRILLATOR GENERATOR CHANGE;  Surgeon: Sanda Klein, MD;  Location: [facility]ATH [facility];  Service: Cardiovascular;  Laterality: N/A;  . NM MYOCAR PERF WALL MOTION  06/05/2007   no ischemia    Current Medications: Current Meds  Medication Sig  . albuterol (VENTOLIN HFA) 108 (90 Base) MCG/ACT inhaler  Inhale 1-2 puffs into the lungs every 6 (six) hours as needed for wheezing or shortness of breath.  Marland Kitchen alendronate (FOSAMAX) 70 MG tablet Take 70 mg by mouth every Sunday.   Marland Kitchen amLODipine (NORVASC) 5 MG tablet Take 1 tablet (5 mg total) by mouth daily.  Marland Kitchen aspirin EC 81 MG tablet Take 1 tablet (81 mg total) by mouth daily.  . carvedilol (COREG) 12.5 MG tablet TAKE 1 TABLET BY MOUTH 2 TIMES DAILY WITH A MEAL. PLEASE MAKE APPT WITH DR. Climax (Patient taking differently: Take 12.5 mg by mouth 2 (two) times daily  with a meal. )  . Cholecalciferol (VITAMIN D3 PO) Take 1 tablet by mouth daily.  . DULoxetine (CYMBALTA) 60 MG capsule Take 60 mg by mouth daily.   . furosemide (LASIX) 20 MG tablet TAKE 1 TABLET BY MOUTH EVERY DAY (Patient taking differently: Take 20 mg by mouth daily. )  . gabapentin (NEURONTIN) 100 MG capsule Take 100-300 mg by mouth at bedtime.   Marland Kitchen liothyronine (CYTOMEL) 25 MCG tablet Take 25 mcg by mouth daily.  . Melatonin 10 MG TABS Take 10 mg by mouth at bedtime.   . Multiple Vitamin (MULTIVITAMIN WITH MINERALS) TABS tablet Take 1 tablet by mouth daily.  Marland Kitchen oxyCODONE (ROXICODONE) 15 MG immediate release tablet Take 15 mg by mouth every 6 (six) hours.   Marland Kitchen SYNTHROID 112 MCG tablet Take 112 mcg by mouth daily before breakfast.   . [DISCONTINUED] amLODipine (NORVASC) 5 MG tablet TAKE 1 TABLET BY MOUTH EVERY DAY (Patient taking differently: Take 5 mg by mouth daily. )     Allergies:   Patient has no known allergies.   Social History   Socioeconomic History  . Marital status: Widowed    Spouse name: Not on file  . Number of children: 2  . Years of education: Not on file  . Highest education level: Not on file  Occupational History  . Occupation: housewife  Tobacco Use  . Smoking status: Never Smoker  . Smokeless tobacco: Never Used  Substance and Sexual Activity  . Alcohol use: No  . Drug use: No  . Sexual activity: Not on file  Other Topics Concern  . Not on file  Social History Narrative  . Not on file   Social Determinants of Health   Financial Resource Strain:   . Difficulty of Paying Living Expenses: Not on file  Food Insecurity:   . Worried About Charity fundraiser in the Last Year: Not on file  . Ran Out of Food in the Last Year: Not on file  Transportation Needs:   . Lack of Transportation (Medical): Not on file  . Lack of Transportation (Non-Medical): Not on file  Physical Activity:   . Days of Exercise per Week: Not on file  . Minutes of Exercise per  Session: Not on file  Stress:   . Feeling of Stress : Not on file  Social Connections:   . Frequency of Communication with Friends and Family: Not on file  . Frequency of Social Gatherings with Friends and Family: Not on file  . Attends Religious Services: Not on file  . Active Member of Clubs or Organizations: Not on file  . Attends Archivist Meetings: Not on file  . Marital Status: Not on file     Family History: The patient's family history includes Heart attack in her father; Heart disease in her paternal grandmother; Lung cancer in her father and mother.  ROS:  Please see the history of present illness.    All other systems reviewed and are negative.  EKGs/Labs/Other Studies Reviewed:    The following studies were reviewed today:  Echo 10/18/17 Study Conclusions  - Left ventricle: The cavity size was normal. Wall thickness was   increased in a pattern of moderate LVH. Systolic function was   normal. The estimated ejection fraction was in the range of 55%   to 60%. Wall motion was normal; there were no regional wall   motion abnormalities. Doppler parameters are consistent with   abnormal left ventricular relaxation (grade 1 diastolic   dysfunction). - Aortic valve: There was mild regurgitation. Valve area (VTI):   2.31 cm^2. Valve area (Vmax): 2.07 cm^2. Valve area (Vmean): 2.26   cm^2. - Mitral valve: Calcified annulus. Valve area by pressure   half-time: 2.18 cm^2. Valve area by continuity equation (using   LVOT flow): 1.33 cm^2. - Left atrium: The atrium was moderately dilated.  Impressions:  - Normal LVEF.   Moderate LVH.   Moderate LAE.   MIld MS.   Trace AR.  EKG:  EKG is not ordered today.    Recent Labs: 03/14/2019: ALT 21; B Natriuretic Peptide 167.7 03/15/2019: BUN 12; Creatinine, Ser 0.65; Hemoglobin 13.4; Platelets 230; Potassium 4.3; Sodium 132  Recent Lipid Panel    Component Value Date/Time   CHOL 135 09/30/2015 0946   TRIG  113 09/30/2015 0946   HDL 32 (L) 09/30/2015 0946   CHOLHDL 4.2 09/30/2015 0946   VLDL 23 09/30/2015 0946   LDLCALC 80 09/30/2015 0946   LDLDIRECT 111 (H) 03/06/2013 1054    Physical Exam:    VS:  BP (!) 166/82 (BP Location: Left Arm, Patient Position: Sitting, Cuff Size: Normal)   Pulse 92   Temp 97.7 F (36.5 C)   Wt 145 lb 9.6 oz (66 kg)   SpO2 92%   BMI 26.63 kg/m     Wt Readings from Last 3 Encounters:  04/17/19 145 lb 9.6 oz (66 kg)  03/17/19 136 lb 14.5 oz (62.1 kg)  01/16/19 133 lb (60.3 kg)     GEN: elderly female in no acute distress, not wearing her oxygen HEENT: Normal NECK: No JVD; No carotid bruits LYMPHATICS: No lymphadenopathy CARDIAC: RRR, no murmurs, rubs, gallops RESPIRATORY:  Significant wheezing throughout lung fields, heard on anterior chest and during carotid auscultation ABDOMEN: Soft, non-tender, non-distended MUSCULOSKELETAL:  + B LE edema; No deformity  SKIN: Warm and dry NEUROLOGIC:  Alert and oriented x 3 PSYCHIATRIC:  Normal affect   ASSESSMENT:    1. Nonischemic cardiomyopathy ([place])   2. Biventricular ICD (implantable cardioverter-defibrillator) in place   3. Chronic obstructive pulmonary disease, unspecified COPD type ([place])   4. Chronic diastolic heart failure ([place])   5. DOE (dyspnea on exertion)   6. Essential hypertension    PLAN:    In order of problems listed above:  NICM s/p BiV ICD Chronic diastolic heart failure  - normal EF on echo 2019 - continue ASA and coreg - if she is still dyspneic after titration of inhalers, will switch to bisoprolol - no losartan for hyperkalemia   DOE COPD exacerbation requiring recent hospitalization - she continues to have DOE now with supple,ental O2 - actively wheezing on exam - dyspnea is related to COPD, not volume   Hypertension - norvasc 5 mg and coreg 12.5 mg BID - no losartan as above - pressures were increased during our last visit but she had not been  taking her coreg -  pressure is elevated here, but she has been out of her norvasc - will reorder this - they are not willing to try to take her BP at home - will rely on PCP visit to see if her pressure is better   I have recommended PCP visit for BP check, for titration of inhalers, and to get humidification on her supplemental O2.   They can bring their BP machine at next visit and we can help them learn how to use it at home.  Follow up in 3 months.   Medication Adjustments/Labs and Tests Ordered: Current medicines are reviewed at length with the patient today.  Concerns regarding medicines are outlined above.  No orders of the defined types were placed in this encounter.  Meds ordered this encounter  Medications  . amLODipine (NORVASC) 5 MG tablet    Sig: Take 1 tablet (5 mg total) by mouth daily.    Dispense:  90 tablet    Refill:  2    Signed, Ledora Bottcher, [place]  04/17/2019 3:25 PM    [facility]

## 2019-04-17 ENCOUNTER — Other Ambulatory Visit: Payer: Self-pay

## 2019-04-17 ENCOUNTER — Ambulatory Visit (INDEPENDENT_AMBULATORY_CARE_PROVIDER_SITE_OTHER): Payer: Medicare Other | Admitting: Physician Assistant

## 2019-04-17 ENCOUNTER — Encounter: Payer: Self-pay | Admitting: Physician Assistant

## 2019-04-17 VITALS — BP 166/82 | HR 92 | Temp 97.7°F | Wt 145.6 lb

## 2019-04-17 DIAGNOSIS — J449 Chronic obstructive pulmonary disease, unspecified: Secondary | ICD-10-CM | POA: Diagnosis not present

## 2019-04-17 DIAGNOSIS — I5032 Chronic diastolic (congestive) heart failure: Secondary | ICD-10-CM

## 2019-04-17 DIAGNOSIS — I428 Other cardiomyopathies: Secondary | ICD-10-CM

## 2019-04-17 DIAGNOSIS — I10 Essential (primary) hypertension: Secondary | ICD-10-CM

## 2019-04-17 DIAGNOSIS — R0600 Dyspnea, unspecified: Secondary | ICD-10-CM

## 2019-04-17 DIAGNOSIS — Z95810 Presence of automatic (implantable) cardiac defibrillator: Secondary | ICD-10-CM

## 2019-04-17 DIAGNOSIS — R0609 Other forms of dyspnea: Secondary | ICD-10-CM

## 2019-04-17 MED ORDER — AMLODIPINE BESYLATE 5 MG PO TABS: 5.0000 mg | ORAL_TABLET | Freq: Every day | ORAL | 2 refills | Status: DC

## 2019-04-17 NOTE — Patient Instructions (Addendum)
Reduce your risk of getting COVID-19 With your heart disease it is especially important for people at increased risk of severe illness from COVID-19, and those who live with them, to protect themselves from getting COVID-19. The best way to protect yourself and to help reduce the spread of the virus that causes COVID-19 is to: Marland Kitchen Limit your interactions with other people as much as possible. . Take precautions to prevent getting COVID-19 when you do interact with others. If you start feeling sick and think you may have COVID-19, get in touch with your healthcare provider within 24 hours.  Follow-Up: IN 3 months  Either In Person or Virtual You may see DR Lequita Halt or one of the following Advanced Practice Providers on your designated Care Team:  Chanetta Marshall, NP  Tommye Standard, PA-C Legrand Como "[place]" [place], [place] .    At [facility], you and your health needs are our priority.  As part of our continuing mission to provide you with exceptional heart care, we have created designated Provider Care Teams.  These Care Teams include your primary Cardiologist (physician) and Advanced Practice Providers (APPs -  Physician Assistants and Nurse Practitioners) who all work together to provide you with the care you need, when you need it.  Thank you for choosing [facility] at [facility]!!

## 2019-05-04 DIAGNOSIS — Z0000 Encounter for general adult medical examination without abnormal findings: Secondary | ICD-10-CM | POA: Diagnosis not present

## 2019-05-04 DIAGNOSIS — I10 Essential (primary) hypertension: Secondary | ICD-10-CM | POA: Diagnosis not present

## 2019-05-04 DIAGNOSIS — I5042 Chronic combined systolic (congestive) and diastolic (congestive) heart failure: Secondary | ICD-10-CM | POA: Diagnosis not present

## 2019-05-04 DIAGNOSIS — G894 Chronic pain syndrome: Secondary | ICD-10-CM | POA: Diagnosis not present

## 2019-05-04 DIAGNOSIS — J45909 Unspecified asthma, uncomplicated: Secondary | ICD-10-CM | POA: Diagnosis not present

## 2019-05-04 DIAGNOSIS — M159 Polyosteoarthritis, unspecified: Secondary | ICD-10-CM | POA: Diagnosis not present

## 2019-05-04 DIAGNOSIS — E039 Hypothyroidism, unspecified: Secondary | ICD-10-CM | POA: Diagnosis not present

## 2019-05-04 DIAGNOSIS — E559 Vitamin D deficiency, unspecified: Secondary | ICD-10-CM | POA: Diagnosis not present

## 2019-05-04 DIAGNOSIS — R2681 Unsteadiness on feet: Secondary | ICD-10-CM | POA: Diagnosis not present

## 2019-05-04 DIAGNOSIS — Z23 Encounter for immunization: Secondary | ICD-10-CM | POA: Diagnosis not present

## 2019-05-04 DIAGNOSIS — Z136 Encounter for screening for cardiovascular disorders: Secondary | ICD-10-CM | POA: Diagnosis not present

## 2019-05-05 ENCOUNTER — Other Ambulatory Visit: Payer: Self-pay | Admitting: Cardiovascular Disease

## 2019-05-07 ENCOUNTER — Ambulatory Visit (INDEPENDENT_AMBULATORY_CARE_PROVIDER_SITE_OTHER): Payer: Medicare Other | Admitting: *Deleted

## 2019-05-07 DIAGNOSIS — I428 Other cardiomyopathies: Secondary | ICD-10-CM | POA: Diagnosis not present

## 2019-05-07 LAB — CUP PACEART REMOTE DEVICE CHECK
Battery Remaining Longevity: 3 mo
Battery Voltage: 2.76 V
Brady Statistic AP VP Percent: 8.27 %
Brady Statistic AP VS Percent: 0.13 %
Brady Statistic AS VP Percent: 90.29 %
Brady Statistic AS VS Percent: 1.31 %
Brady Statistic RA Percent Paced: 8.36 %
Brady Statistic RV Percent Paced: 96.87 %
Date Time Interrogation Session: 20210208033522
HighPow Impedance: 47 Ohm
HighPow Impedance: 61 Ohm
Implantable Lead Implant Date: 20090331
Implantable Lead Implant Date: 20090331
Implantable Lead Implant Date: 20090401
Implantable Lead Location: 753858
Implantable Lead Location: 753859
Implantable Lead Location: 753860
Implantable Lead Model: 4194
Implantable Lead Model: 5076
Implantable Lead Model: 6947
Implantable Pulse Generator Implant Date: 20150505
Lead Channel Impedance Value: 342 Ohm
Lead Channel Impedance Value: 418 Ohm
Lead Channel Impedance Value: 456 Ohm
Lead Channel Impedance Value: 475 Ohm
Lead Channel Impedance Value: 475 Ohm
Lead Channel Impedance Value: 722 Ohm
Lead Channel Pacing Threshold Amplitude: 0.625 V
Lead Channel Pacing Threshold Amplitude: 1.25 V
Lead Channel Pacing Threshold Amplitude: 1.375 V
Lead Channel Pacing Threshold Pulse Width: 0.4 ms
Lead Channel Pacing Threshold Pulse Width: 0.4 ms
Lead Channel Pacing Threshold Pulse Width: 0.4 ms
Lead Channel Sensing Intrinsic Amplitude: 18.625 mV
Lead Channel Sensing Intrinsic Amplitude: 18.625 mV
Lead Channel Sensing Intrinsic Amplitude: 3.875 mV
Lead Channel Sensing Intrinsic Amplitude: 3.875 mV
Lead Channel Setting Pacing Amplitude: 2 V
Lead Channel Setting Pacing Amplitude: 2.25 V
Lead Channel Setting Pacing Amplitude: 3.5 V
Lead Channel Setting Pacing Pulse Width: 0.4 ms
Lead Channel Setting Pacing Pulse Width: 0.4 ms
Lead Channel Setting Sensing Sensitivity: 0.3 mV

## 2019-05-08 NOTE — Progress Notes (Signed)
ICD Remote  

## 2019-06-11 ENCOUNTER — Ambulatory Visit (INDEPENDENT_AMBULATORY_CARE_PROVIDER_SITE_OTHER): Payer: Medicare Other | Admitting: *Deleted

## 2019-06-11 DIAGNOSIS — I428 Other cardiomyopathies: Secondary | ICD-10-CM

## 2019-06-11 LAB — CUP PACEART REMOTE DEVICE CHECK
Battery Remaining Longevity: 2 mo
Battery Voltage: 2.73 V
Brady Statistic AP VP Percent: 10.12 %
Brady Statistic AP VS Percent: 0.18 %
Brady Statistic AS VP Percent: 88.37 %
Brady Statistic AS VS Percent: 1.32 %
Brady Statistic RA Percent Paced: 10.25 %
Brady Statistic RV Percent Paced: 96.45 %
Date Time Interrogation Session: 20210315022604
HighPow Impedance: 50 Ohm
HighPow Impedance: 67 Ohm
Implantable Lead Implant Date: 20090331
Implantable Lead Implant Date: 20090331
Implantable Lead Implant Date: 20090401
Implantable Lead Location: 753858
Implantable Lead Location: 753859
Implantable Lead Location: 753860
Implantable Lead Model: 4194
Implantable Lead Model: 5076
Implantable Lead Model: 6947
Implantable Pulse Generator Implant Date: 20150505
Lead Channel Impedance Value: 399 Ohm
Lead Channel Impedance Value: 418 Ohm
Lead Channel Impedance Value: 513 Ohm
Lead Channel Impedance Value: 513 Ohm
Lead Channel Impedance Value: 513 Ohm
Lead Channel Impedance Value: 703 Ohm
Lead Channel Pacing Threshold Amplitude: 0.75 V
Lead Channel Pacing Threshold Amplitude: 1.375 V
Lead Channel Pacing Threshold Amplitude: 1.5 V
Lead Channel Pacing Threshold Pulse Width: 0.4 ms
Lead Channel Pacing Threshold Pulse Width: 0.4 ms
Lead Channel Pacing Threshold Pulse Width: 0.4 ms
Lead Channel Sensing Intrinsic Amplitude: 19.375 mV
Lead Channel Sensing Intrinsic Amplitude: 19.375 mV
Lead Channel Sensing Intrinsic Amplitude: 3.625 mV
Lead Channel Sensing Intrinsic Amplitude: 3.625 mV
Lead Channel Setting Pacing Amplitude: 2 V
Lead Channel Setting Pacing Amplitude: 2.5 V
Lead Channel Setting Pacing Amplitude: 3 V
Lead Channel Setting Pacing Pulse Width: 0.4 ms
Lead Channel Setting Pacing Pulse Width: 0.4 ms
Lead Channel Setting Sensing Sensitivity: 0.3 mV

## 2019-06-11 NOTE — Progress Notes (Signed)
ICD Remote  

## 2019-06-11 NOTE — Addendum Note (Signed)
Addended by: Jennette Banker on: 06/11/2019 02:12 PM   Modules accepted: Level of Service

## 2019-06-25 ENCOUNTER — Telehealth: Payer: Self-pay

## 2019-06-25 NOTE — Telephone Encounter (Signed)
LMOVM to call DC back to assess episode of NSVT and inform about battery reaching RRT.  Hours and direct phone number provided.  Patients BI-V ICD has reached RRT 06/24/19. Will need to make apt. With Dr. Caryl Comes for gen change.  1 episode of NSVT (14 beats with rate of 162 bpm).

## 2019-06-27 NOTE — Telephone Encounter (Signed)
Patient has appointment 07/02/19 to discuss generator change.

## 2019-07-02 ENCOUNTER — Encounter: Payer: Medicare Other | Admitting: Internal Medicine

## 2019-07-12 ENCOUNTER — Ambulatory Visit (INDEPENDENT_AMBULATORY_CARE_PROVIDER_SITE_OTHER): Payer: Medicare Other | Admitting: *Deleted

## 2019-07-12 DIAGNOSIS — I428 Other cardiomyopathies: Secondary | ICD-10-CM

## 2019-07-12 LAB — CUP PACEART REMOTE DEVICE CHECK
Battery Remaining Longevity: 1 mo — CL
Battery Voltage: 2.69 V
Brady Statistic AP VP Percent: 1.07 %
Brady Statistic AP VS Percent: 0.03 %
Brady Statistic AS VP Percent: 97.6 %
Brady Statistic AS VS Percent: 1.3 %
Brady Statistic RA Percent Paced: 1.1 %
Brady Statistic RV Percent Paced: 98.42 %
Date Time Interrogation Session: 20210415052726
HighPow Impedance: 47 Ohm
HighPow Impedance: 62 Ohm
Implantable Lead Implant Date: 20090331
Implantable Lead Implant Date: 20090331
Implantable Lead Implant Date: 20090401
Implantable Lead Location: 753858
Implantable Lead Location: 753859
Implantable Lead Location: 753860
Implantable Lead Model: 4194
Implantable Lead Model: 5076
Implantable Lead Model: 6947
Implantable Pulse Generator Implant Date: 20150505
Lead Channel Impedance Value: 361 Ohm
Lead Channel Impedance Value: 361 Ohm
Lead Channel Impedance Value: 456 Ohm
Lead Channel Impedance Value: 475 Ohm
Lead Channel Impedance Value: 475 Ohm
Lead Channel Impedance Value: 665 Ohm
Lead Channel Pacing Threshold Amplitude: 0.75 V
Lead Channel Pacing Threshold Amplitude: 1.375 V
Lead Channel Pacing Threshold Amplitude: 1.625 V
Lead Channel Pacing Threshold Pulse Width: 0.4 ms
Lead Channel Pacing Threshold Pulse Width: 0.4 ms
Lead Channel Pacing Threshold Pulse Width: 0.4 ms
Lead Channel Sensing Intrinsic Amplitude: 19.375 mV
Lead Channel Sensing Intrinsic Amplitude: 19.375 mV
Lead Channel Sensing Intrinsic Amplitude: 2.375 mV
Lead Channel Sensing Intrinsic Amplitude: 2.375 mV
Lead Channel Setting Pacing Amplitude: 2 V
Lead Channel Setting Pacing Amplitude: 2.5 V
Lead Channel Setting Pacing Amplitude: 3.75 V
Lead Channel Setting Pacing Pulse Width: 0.4 ms
Lead Channel Setting Pacing Pulse Width: 0.4 ms
Lead Channel Setting Sensing Sensitivity: 0.3 mV

## 2019-07-20 DIAGNOSIS — M159 Polyosteoarthritis, unspecified: Secondary | ICD-10-CM | POA: Diagnosis not present

## 2019-07-20 DIAGNOSIS — R2681 Unsteadiness on feet: Secondary | ICD-10-CM | POA: Diagnosis not present

## 2019-07-20 DIAGNOSIS — I10 Essential (primary) hypertension: Secondary | ICD-10-CM | POA: Diagnosis not present

## 2019-07-20 DIAGNOSIS — G622 Polyneuropathy due to other toxic agents: Secondary | ICD-10-CM | POA: Diagnosis not present

## 2019-07-20 DIAGNOSIS — G894 Chronic pain syndrome: Secondary | ICD-10-CM | POA: Diagnosis not present

## 2019-07-26 ENCOUNTER — Other Ambulatory Visit: Payer: Self-pay | Admitting: Cardiovascular Disease

## 2019-08-02 ENCOUNTER — Telehealth: Payer: Self-pay

## 2019-08-02 ENCOUNTER — Encounter: Payer: Medicare Other | Admitting: Internal Medicine

## 2019-08-02 ENCOUNTER — Other Ambulatory Visit: Payer: Self-pay

## 2019-08-02 VITALS — Ht 62.0 in | Wt 140.0 lb

## 2019-08-02 NOTE — Progress Notes (Signed)
Pt was late and then couldn't wait

## 2019-08-02 NOTE — Telephone Encounter (Signed)
  Patient Consent for Virtual Visit         [name] has provided verbal consent on 08/02/2019 for a virtual visit (video or telephone).   CONSENT FOR VIRTUAL VISIT FOR:  [name]  By participating in this virtual visit I agree to the following:  I hereby voluntarily request, consent and authorize [facility] and its employed or contracted physicians, physician assistants, nurse practitioners or other licensed health care professionals (the Practitioner), to provide me with telemedicine health care services (the "Services") as deemed necessary by the treating Practitioner. I acknowledge and consent to receive the Services by the Practitioner via telemedicine. I understand that the telemedicine visit will involve communicating with the Practitioner through live audiovisual communication technology and the disclosure of certain medical information by electronic transmission. I acknowledge that I have been given the opportunity to request an in-person assessment or other available alternative prior to the telemedicine visit and am voluntarily participating in the telemedicine visit.  I understand that I have the right to withhold or withdraw my consent to the use of telemedicine in the course of my care at any time, without affecting my right to future care or treatment, and that the Practitioner or I may terminate the telemedicine visit at any time. I understand that I have the right to inspect all information obtained and/or recorded in the course of the telemedicine visit and may receive copies of available information for a reasonable fee.  I understand that some of the potential risks of receiving the Services via telemedicine include:  Marland Kitchen Delay or interruption in medical evaluation due to technological equipment failure or disruption; . Information transmitted may not be sufficient (e.g. poor resolution of images) to allow for appropriate medical decision making by the Practitioner;  and/or  . In rare instances, security protocols could fail, causing a breach of personal health information.  Furthermore, I acknowledge that it is my responsibility to provide information about my medical history, conditions and care that is complete and accurate to the best of my ability. I acknowledge that Practitioner's advice, recommendations, and/or decision may be based on factors not within their control, such as incomplete or inaccurate data provided by me or distortions of diagnostic images or specimens that may result from electronic transmissions. I understand that the practice of medicine is not an exact science and that Practitioner makes no warranties or guarantees regarding treatment outcomes. I acknowledge that a copy of this consent can be made available to me via my patient portal ([facility]), or I can request a printed copy by calling the office of [facility].    I understand that my insurance will be billed for this visit.   I have read or had this consent read to me. . I understand the contents of this consent, which adequately explains the benefits and risks of the Services being provided via telemedicine.  . I have been provided ample opportunity to ask questions regarding this consent and the Services and have had my questions answered to my satisfaction. . I give my informed consent for the services to be provided through the use of telemedicine in my medical care

## 2019-08-06 ENCOUNTER — Ambulatory Visit (INDEPENDENT_AMBULATORY_CARE_PROVIDER_SITE_OTHER): Payer: Medicare Other | Admitting: *Deleted

## 2019-08-06 DIAGNOSIS — I428 Other cardiomyopathies: Secondary | ICD-10-CM

## 2019-08-06 LAB — CUP PACEART REMOTE DEVICE CHECK
Battery Remaining Longevity: 1 mo — CL
Battery Voltage: 2.66 V
Brady Statistic AP VP Percent: 6.01 %
Brady Statistic AP VS Percent: 0.1 %
Brady Statistic AS VP Percent: 92.63 %
Brady Statistic AS VS Percent: 1.26 %
Brady Statistic RA Percent Paced: 6.1 %
Brady Statistic RV Percent Paced: 97.78 %
Date Time Interrogation Session: 20210510033325
HighPow Impedance: 47 Ohm
HighPow Impedance: 57 Ohm
Implantable Lead Implant Date: 20090331
Implantable Lead Implant Date: 20090331
Implantable Lead Implant Date: 20090401
Implantable Lead Location: 753858
Implantable Lead Location: 753859
Implantable Lead Location: 753860
Implantable Lead Model: 4194
Implantable Lead Model: 5076
Implantable Lead Model: 6947
Implantable Pulse Generator Implant Date: 20150505
Lead Channel Impedance Value: 361 Ohm
Lead Channel Impedance Value: 361 Ohm
Lead Channel Impedance Value: 456 Ohm
Lead Channel Impedance Value: 475 Ohm
Lead Channel Impedance Value: 475 Ohm
Lead Channel Impedance Value: 703 Ohm
Lead Channel Pacing Threshold Amplitude: 0.875 V
Lead Channel Pacing Threshold Amplitude: 1.375 V
Lead Channel Pacing Threshold Amplitude: 1.625 V
Lead Channel Pacing Threshold Pulse Width: 0.4 ms
Lead Channel Pacing Threshold Pulse Width: 0.4 ms
Lead Channel Pacing Threshold Pulse Width: 0.4 ms
Lead Channel Sensing Intrinsic Amplitude: 18.25 mV
Lead Channel Sensing Intrinsic Amplitude: 18.25 mV
Lead Channel Sensing Intrinsic Amplitude: 2.75 mV
Lead Channel Sensing Intrinsic Amplitude: 2.75 mV
Lead Channel Setting Pacing Amplitude: 2 V
Lead Channel Setting Pacing Amplitude: 2.5 V
Lead Channel Setting Pacing Amplitude: 3.75 V
Lead Channel Setting Pacing Pulse Width: 0.4 ms
Lead Channel Setting Pacing Pulse Width: 0.4 ms
Lead Channel Setting Sensing Sensitivity: 0.3 mV

## 2019-08-07 NOTE — Progress Notes (Signed)
Remote ICD transmission.   

## 2019-08-16 ENCOUNTER — Telehealth (INDEPENDENT_AMBULATORY_CARE_PROVIDER_SITE_OTHER): Payer: Medicare Other | Admitting: Internal Medicine

## 2019-08-16 ENCOUNTER — Other Ambulatory Visit: Payer: Self-pay

## 2019-08-16 VITALS — Ht 62.0 in | Wt 140.0 lb

## 2019-08-16 DIAGNOSIS — I428 Other cardiomyopathies: Secondary | ICD-10-CM | POA: Diagnosis not present

## 2019-08-16 DIAGNOSIS — Z01812 Encounter for preprocedural laboratory examination: Secondary | ICD-10-CM

## 2019-08-16 DIAGNOSIS — Z95810 Presence of automatic (implantable) cardiac defibrillator: Secondary | ICD-10-CM

## 2019-08-16 MED ORDER — FUROSEMIDE 40 MG PO TABS: 40.0000 mg | ORAL_TABLET | Freq: Every day | ORAL | 3 refills | Status: DC

## 2019-08-16 NOTE — Progress Notes (Signed)
Electrophysiology TeleHealth Note   Due to national recommendations of social distancing due to COVID 19, an audio/video telehealth visit is felt to be most appropriate for this patient at this time.  See MyChart message from today for the patient's consent to telehealth for [facility].   Date:  08/16/2019   ID:  [name], DOB December 29, 1928, MRN [id number]  Location: patient's home  Provider location: [address] [place]  Evaluation Performed: Follow-up visit  PCP:  Garth Bigness (Inactive)  Cardiologist:   [facility] Electrophysiologist:  SK   Chief Complaint: Device at end-of-life  History of Present Illness:    [name] is a 86 y.o. female who presents via audio/video conferencing for a telehealth visit today.  Since last being seen in our clinic r CRT-D initially implanted [id number] complicated by a left ventricular lead dislodgment and change out 5/15 with intercurrent normalization of LV function    Has discussed with Dr Thermon Leyland downgrade of her HV CRT device  They felt like he was saying that it was time for her just to die  Also with worsening SOB and edema up to knees,  Sodium dietary indiscretion     Date Cr K Hgb  12/20 0.65 4.3 13.4           The patient denies symptoms of fevers, chills, cough, or new SOB worrisome for COVID 19.    Past Medical History:  Diagnosis Date  . AICD (automatic cardioverter/defibrillator) present 06/27/2007   medtronic  . ARF (acute renal failure) ([place]) 09/2017  . Cancer ([name])    ovarian--stage 4  . Cardiomyopathy ([place])   . CHF (congestive heart failure) ([place])   . Dyspnea   . Peripheral neuropathy   . Systemic hypertension   . Thyroid disease     Past Surgical History:  Procedure Laterality Date  . ABDOMINAL HYSTERECTOMY  oct 2000   along with ovarian ca surg  . APPENDECTOMY    . CARDIAC CATHETERIZATION  09/27/1990   normal coronaries,dilated cardiomyopathy  . CARDIAC DEFIBRILLATOR PLACEMENT   06/27/2007   medtronic concerto  . CHOLECYSTECTOMY    . IMPLANTABLE CARDIOVERTER DEFIBRILLATOR GENERATOR CHANGE N/A 07/31/2013   Procedure: IMPLANTABLE CARDIOVERTER DEFIBRILLATOR GENERATOR CHANGE;  Surgeon: Sanda , MD;  Location: [facility] CATH LAB;  Service: Cardiovascular;  Laterality: N/A;  . NM MYOCAR PERF WALL MOTION  06/05/2007   no ischemia    Current Outpatient Medications  Medication Sig Dispense Refill  . albuterol (VENTOLIN HFA) 108 (90 Base) MCG/ACT inhaler Inhale 1-2 puffs into the lungs every 6 (six) hours as needed for wheezing or shortness of breath.    Marland Kitchen alendronate (FOSAMAX) 70 MG tablet Take 70 mg by mouth every Sunday.     Marland Kitchen amLODipine (NORVASC) 5 MG tablet Take 1 tablet (5 mg total) by mouth daily. 90 tablet 2  . aspirin EC 81 MG tablet Take 1 tablet (81 mg total) by mouth daily. 90 tablet 3  . carvedilol (COREG) 12.5 MG tablet Take 1 tablet (12.5 mg total) by mouth 2 (two) times daily with a meal. 180 tablet 3  . Cholecalciferol (VITAMIN D3 PO) Take 1 tablet by mouth daily.    . DULoxetine (CYMBALTA) 60 MG capsule Take 60 mg by mouth daily.   2  . gabapentin (NEURONTIN) 100 MG capsule Take 100-300 mg by mouth at bedtime.     Marland Kitchen liothyronine (CYTOMEL) 25 MCG tablet Take 25 mcg by mouth daily.    Marland Kitchen  Melatonin 10 MG TABS Take 10 mg by mouth at bedtime.     . Multiple Vitamin (MULTIVITAMIN WITH MINERALS) TABS tablet Take 1 tablet by mouth daily.    Marland Kitchen oxyCODONE (ROXICODONE) 15 MG immediate release tablet Take 15 mg by mouth every 6 (six) hours.   0  . SYNTHROID 112 MCG tablet Take 112 mcg by mouth daily before breakfast.     . furosemide (LASIX) 40 MG tablet Take 1 tablet (40 mg total) by mouth daily. 90 tablet 3   No current facility-administered medications for this visit.    Allergies:   Patient has no known allergies.   Social History:  The patient  reports that she has never smoked. She has never used smokeless tobacco. She reports that she does not drink alcohol or use  drugs.   Family History:  The patient's   family history includes Heart attack in her father; Heart disease in her paternal grandmother; Lung cancer in her father and mother.   ROS:  Please see the history of present illness.   All other systems are personally reviewed and negative.    Exam:    Vital Signs:  Ht 5\' 2"  (1.575 m)   Wt 140 lb (63.5 kg)   BMI 25.61 kg/m     Well appearing, alert and conversant, regular work of breathing,  good skin color Eyes- anicteric, neuro- grossly intact, skin- no apparent rash or lesions or cyanosis, mouth- oral mucosa is pink   Labs/Other Tests and Data Reviewed:    Recent Labs: 03/14/2019: ALT 21; B Natriuretic Peptide 167.7 03/15/2019: BUN 12; Creatinine, Ser 0.65; Hemoglobin 13.4; Platelets 230; Potassium 4.3; Sodium 132   Wt Readings from Last 3 Encounters:  08/16/19 140 lb (63.5 kg)  08/02/19 140 lb (63.5 kg)  04/17/19 145 lb 9.6 oz (66 kg)     Other studies personally reviewed: Additional studies/ records that were reviewed today include:  As above   Review of the above records today demonstrates:   Prior radiographs:       Last device remote is reviewed from [place] PDF dated 5/21* which reveals normal device function,   arrhythmias - none \ Device at RRT   ASSESSMENT & PLAN:   Chronic combined systolic and diastolic CHF, NYHA class 2 acute chronic   .  Biventricular ICD Medtronic Concerto implanted [id number]    Nonischemic cardiomyopathy   End of LIfe decision making   Long discussion with patient and daughter about end of life and mode of dying   She would like to retain the option of dying suddenly and as such would like to down grade from ICD to Pacemaker  We have reviewed the benefits and risks of generator replacement.  These include but are not limited to lead fracture and infection.  The patient understands, agrees and is willing to proceed.     This is her 4th procedure so would use Abx pouch]  Volume overloaded  thought 2/2 dietary indiscretion-- will inrease furosemide 40 mg daily     COVID 19 screen The patient denies symptoms of COVID 19 at this time.  The importance of social distancing was discussed today.  Follow-up:  Post gen change Next remote: As above   Current medicines are reviewed at length with the patient today.   The patient does not have concerns regarding her medicines.  The following changes were made today:  none  Labs/ tests ordered today include:  Generator change No orders of the defined types  were placed in this encounter.   Future tests ( post COVID )    Patient Risk:  after full review of this patients clinical status, I feel that they are at moderate risk at this time.  Today, I have spent 15 minutes with the patient with telehealth technology discussing the above.  Signed, Virl Axe, MD  08/16/2019 7:38 PM     [facility] [address] [place] [address] [phone] (office) [phone] (fax)

## 2019-08-16 NOTE — Patient Instructions (Signed)
Medication Instructions:  Your physician recommends that you continue on your current medications as directed. Please refer to the Current Medication list given to you today.  Labwork: None ordered.  Testing/Procedures: None ordered.  Follow-Up: Dr Olin Pia scheduler will call you with follow up appointment.  Remote monitoring is used to monitor your Pacemaker of ICD from home. This monitoring reduces the number of office visits required to check your device to one time per year. It allows [name] to keep an eye on the functioning of your device to ensure it is working properly.   Any Other Special Instructions Will Be Listed Below (If Applicable).  If you need a refill on your cardiac medications before your next appointment, please call your pharmacy.

## 2019-08-16 NOTE — H&P (View-Only) (Signed)
Electrophysiology TeleHealth Note   Due to national recommendations of social distancing due to COVID 19, an audio/video telehealth visit is felt to be most appropriate for this patient at this time.  See MyChart message from today for the patient's consent to telehealth for [facility].   Date:  08/16/2019   ID:  [name], DOB 01-28-1929, MRN [id number]  Location: patient's home  Provider location: [address] [place]  Evaluation Performed: Follow-up visit  PCP:  Garth Bigness (Inactive)  Cardiologist:   [facility] Electrophysiologist:  SK   Chief Complaint: Device at end-of-life  History of Present Illness:    [name] is a 86 y.o. female who presents via audio/video conferencing for a telehealth visit today.  Since last being seen in our clinic r CRT-D initially implanted [id number] complicated by a left ventricular lead dislodgment and change out 5/15 with intercurrent normalization of LV function    Has discussed with Dr Thermon Leyland downgrade of her HV CRT device  They felt like he was saying that it was time for her just to die  Also with worsening SOB and edema up to knees,  Sodium dietary indiscretion     Date Cr K Hgb  12/20 0.65 4.3 13.4           The patient denies symptoms of fevers, chills, cough, or new SOB worrisome for COVID 19.    Past Medical History:  Diagnosis Date  . AICD (automatic cardioverter/defibrillator) present 06/27/2007   medtronic  . ARF (acute renal failure) ([place]) 09/2017  . Cancer ([place])    ovarian--stage 4  . Cardiomyopathy ([place])   . CHF (congestive heart failure) ([place])   . Dyspnea   . Peripheral neuropathy   . Systemic hypertension   . Thyroid disease     Past Surgical History:  Procedure Laterality Date  . ABDOMINAL HYSTERECTOMY  oct 2000   along with ovarian ca surg  . APPENDECTOMY    . CARDIAC CATHETERIZATION  09/27/1990   normal coronaries,dilated cardiomyopathy  . CARDIAC DEFIBRILLATOR PLACEMENT   06/27/2007   medtronic concerto  . CHOLECYSTECTOMY    . IMPLANTABLE CARDIOVERTER DEFIBRILLATOR GENERATOR CHANGE N/A 07/31/2013   Procedure: IMPLANTABLE CARDIOVERTER DEFIBRILLATOR GENERATOR CHANGE;  Surgeon: Sanda Klein, MD;  Location: [facility] CATH LAB;  Service: Cardiovascular;  Laterality: N/A;  . NM MYOCAR PERF WALL MOTION  06/05/2007   no ischemia    Current Outpatient Medications  Medication Sig Dispense Refill  . albuterol (VENTOLIN HFA) 108 (90 Base) MCG/ACT inhaler Inhale 1-2 puffs into the lungs every 6 (six) hours as needed for wheezing or shortness of breath.    Marland Kitchen alendronate (FOSAMAX) 70 MG tablet Take 70 mg by mouth every Sunday.     Marland Kitchen amLODipine (NORVASC) 5 MG tablet Take 1 tablet (5 mg total) by mouth daily. 90 tablet 2  . aspirin EC 81 MG tablet Take 1 tablet (81 mg total) by mouth daily. 90 tablet 3  . carvedilol (COREG) 12.5 MG tablet Take 1 tablet (12.5 mg total) by mouth 2 (two) times daily with a meal. 180 tablet 3  . Cholecalciferol (VITAMIN D3 PO) Take 1 tablet by mouth daily.    . DULoxetine (CYMBALTA) 60 MG capsule Take 60 mg by mouth daily.   2  . gabapentin (NEURONTIN) 100 MG capsule Take 100-300 mg by mouth at bedtime.     Marland Kitchen liothyronine (CYTOMEL) 25 MCG tablet Take 25 mcg by mouth daily.    Marland Kitchen  Melatonin 10 MG TABS Take 10 mg by mouth at bedtime.     . Multiple Vitamin (MULTIVITAMIN WITH MINERALS) TABS tablet Take 1 tablet by mouth daily.    Marland Kitchen oxyCODONE (ROXICODONE) 15 MG immediate release tablet Take 15 mg by mouth every 6 (six) hours.   0  . SYNTHROID 112 MCG tablet Take 112 mcg by mouth daily before breakfast.     . furosemide (LASIX) 40 MG tablet Take 1 tablet (40 mg total) by mouth daily. 90 tablet 3   No current facility-administered medications for this visit.    Allergies:   Patient has no known allergies.   Social History:  The patient  reports that she has never smoked. She has never used smokeless tobacco. She reports that she does not drink alcohol or use  drugs.   Family History:  The patient's   family history includes Heart attack in her father; Heart disease in her paternal grandmother; Lung cancer in her father and mother.   ROS:  Please see the history of present illness.   All other systems are personally reviewed and negative.    Exam:    Vital Signs:  Ht 5\' 2"  (1.575 m)   Wt 140 lb (63.5 kg)   BMI 25.61 kg/m     Well appearing, alert and conversant, regular work of breathing,  good skin color Eyes- anicteric, neuro- grossly intact, skin- no apparent rash or lesions or cyanosis, mouth- oral mucosa is pink   Labs/Other Tests and Data Reviewed:    Recent Labs: 03/14/2019: ALT 21; B Natriuretic Peptide 167.7 03/15/2019: BUN 12; Creatinine, Ser 0.65; Hemoglobin 13.4; Platelets 230; Potassium 4.3; Sodium 132   Wt Readings from Last 3 Encounters:  08/16/19 140 lb (63.5 kg)  08/02/19 140 lb (63.5 kg)  04/17/19 145 lb 9.6 oz (66 kg)     Other studies personally reviewed: Additional studies/ records that were reviewed today include:  As above   Review of the above records today demonstrates:   Prior radiographs:       Last device remote is reviewed from [place] PDF dated 5/21* which reveals normal device function,   arrhythmias - none \ Device at RRT   ASSESSMENT & PLAN:   Chronic combined systolic and diastolic CHF, NYHA class 2 acute chronic   .  Biventricular ICD Medtronic Concerto implanted 123XX123    Nonischemic cardiomyopathy   End of LIfe decision making   Long discussion with patient and daughter about end of life and mode of dying   She would like to retain the option of dying suddenly and as such would like to down grade from ICD to Pacemaker  We have reviewed the benefits and risks of generator replacement.  These include but are not limited to lead fracture and infection.  The patient understands, agrees and is willing to proceed.     This is her 4th procedure so would use Abx pouch]  Volume overloaded  thought 2/2 dietary indiscretion-- will inrease furosemide 40 mg daily     COVID 19 screen The patient denies symptoms of COVID 19 at this time.  The importance of social distancing was discussed today.  Follow-up:  Post gen change Next remote: As above   Current medicines are reviewed at length with the patient today.   The patient does not have concerns regarding her medicines.  The following changes were made today:  none  Labs/ tests ordered today include:  Generator change No orders of the defined types  were placed in this encounter.   Future tests ( post COVID )    Patient Risk:  after full review of this patients clinical status, I feel that they are at moderate risk at this time.  Today, I have spent 15 minutes with the patient with telehealth technology discussing the above.  Signed, Virl Axe, MD  08/16/2019 7:38 PM     [facility] [address] [place] [address] [phone] (office) [phone] (fax)

## 2019-08-20 ENCOUNTER — Telehealth: Payer: Self-pay

## 2019-08-20 NOTE — Addendum Note (Signed)
Addended by: Thora Lance on: 08/20/2019 10:14 AM   Modules accepted: Orders

## 2019-08-20 NOTE — Telephone Encounter (Signed)
Left message with pt's daughter to have pt contact RN at [phone]. Discussed having pt add daughter as DPR in order to discuss pt's information. Pt's daughter verbalizes understanding and will have pt contact [facility].

## 2019-08-21 ENCOUNTER — Telehealth: Payer: Self-pay

## 2019-08-21 NOTE — Telephone Encounter (Signed)
Attempted phone call to pt.  Left voicemail message for pt to contact RN at [phone]. 

## 2019-08-29 ENCOUNTER — Encounter: Payer: Self-pay | Admitting: *Deleted

## 2019-08-29 ENCOUNTER — Telehealth: Payer: Self-pay | Admitting: *Deleted

## 2019-08-29 NOTE — Telephone Encounter (Signed)
-----   Message from Sanda Klein, MD sent at 08/29/2019  9:01 AM EDT ----- Could we tentatively schedule generator change for June 14, please? ----- Message ----- From: Deboraha Sprang, MD Sent: 08/17/2019   8:50 PM EDT To: Sanda Klein, MD  Mihai-- I had a conversation with her and her dagughter yday about downgrading her device -- they struggled a little with your conversation last fall related to the downgrade,, hearing from one of the most diplomatic of all MDs, that she is old and should just die and so should get a pacemaker She decided to go along with your  recommendation  I cant remember what has happened apout scheduling  you might just have your office call her  Richardson Landry

## 2019-08-29 NOTE — Telephone Encounter (Signed)
Unable to reach pt or leave a message  

## 2019-08-30 ENCOUNTER — Telehealth: Payer: Self-pay

## 2019-08-30 NOTE — Telephone Encounter (Signed)
Patient's daughter returning Marsha's call.

## 2019-08-30 NOTE — Telephone Encounter (Signed)
Follow up   Patient's daughter has additional questions per the below message. Please call.

## 2019-08-30 NOTE — Telephone Encounter (Signed)
Patient's daughter, Arrie Aran, is calling to follow up in regards to requesting to reschedule procedure currently scheduled for 09/10/19. Please call to assist.

## 2019-08-30 NOTE — Telephone Encounter (Signed)
Spoke with pt daughter, she can not do the generator change on the 14th. She can do the 21st or the 28th. Will cancel gen change for the 14th and make sure with dr c it is okay to go out that far as device is ERI. Daughter agreed with that plan.

## 2019-08-30 NOTE — Telephone Encounter (Signed)
Attempted phone call to Pt's daughter, Arrie Aran and left message to contact RN at [phone].  Attempted phone call to pt's home number listed in Epic and no answer or voicemail.

## 2019-08-30 NOTE — Telephone Encounter (Signed)
Spoke with pt's daughter Arrie Aran and advised Dr Croitoru's office has scheduled appointment for pt's generator change 09/10/2019.  Pt's daughter states she is unaware of this appointment.  Advised daughter to contact Dr Croitoru's office, Fredia Beets RN, to discuss generator change appointment and contact this RN as to how she would like to proceed with Dr Caryl Comes.  Pt's daughter verbalizes understanding and agrees with current plan.

## 2019-08-30 NOTE — Telephone Encounter (Signed)
Follow up  Pt's daughter Arrie Aran calling, she wanted to know what time the pt needs to arrive to her procedure on 06/14. She said if she not able to answer to leave a detailed message

## 2019-08-30 NOTE — Telephone Encounter (Signed)
Attempted phone call, left voicemail message to contact RN at [phone].

## 2019-08-31 NOTE — Telephone Encounter (Signed)
Patient's daughter states she is calling to follow up with [name] in regards to procedure scheduled for 09/10/19. She states she has additional questions. However, she will not be available for a call back 1:30 PM - 5:30 PM.

## 2019-08-31 NOTE — Telephone Encounter (Signed)
Spoke with pt daughter, instructions for procedure Monday 09-10-19 discussed in detail with daughter. Letter mailed to patient address. Scrub placed at the front desk for pick up on 09-06-19. Lab orders placed and covid testing scheduled.

## 2019-08-31 NOTE — Telephone Encounter (Signed)
Spoke with pt's daughter who states she has rearranged her schedule for 09/10/2019 PPM generator change to be done by Dr Sallyanne Kuster.  Secure messaged Fredia Beets RN to notify pt and daughter are now agreeable to 09/10/2019 date.  Hilda Blades, RN states she will contact pt and daughter.

## 2019-09-03 ENCOUNTER — Telehealth: Payer: Self-pay | Admitting: Internal Medicine

## 2019-09-03 DIAGNOSIS — H401133 Primary open-angle glaucoma, bilateral, severe stage: Secondary | ICD-10-CM | POA: Diagnosis not present

## 2019-09-03 NOTE — Telephone Encounter (Signed)
Left message for [name] that Dr. Olin Pia nurse is out of the office today, but will call back tomorrow.

## 2019-09-03 NOTE — Telephone Encounter (Signed)
Follow up   Patient's is returning call. She states that Dr. Sallyanne Kuster called. Please call.

## 2019-09-03 NOTE — Telephone Encounter (Signed)
Talked to daughter regarding device generator changeout 6/14

## 2019-09-03 NOTE — Telephone Encounter (Signed)
New message   Patient's daughter states that she is returning call from Dr. Olin Pia office. Please call.

## 2019-09-04 ENCOUNTER — Telehealth: Payer: Self-pay | Admitting: *Deleted

## 2019-09-04 NOTE — Telephone Encounter (Signed)
Spoke with pt's daughter who reports pt is scheduled for generator change on 09/10/2019 with Dr Sallyanne Kuster.

## 2019-09-04 NOTE — Telephone Encounter (Signed)
Spoke with pt daughter, aware covid testing is no longer required and per dr croitoru the lab work can be done on procedure day. They will need to come by the office to get the scrub for pre procedure cleaning. Daughter voiced understanding.

## 2019-09-06 ENCOUNTER — Other Ambulatory Visit (HOSPITAL_COMMUNITY): Payer: Medicare Other

## 2019-09-07 NOTE — Telephone Encounter (Signed)
Spoke with pt, questions regarding pre-procedure scrub answered.

## 2019-09-07 NOTE — Telephone Encounter (Signed)
Follow up   Patient's daughter has questions about the information that is in the previous message. Please call.

## 2019-09-09 DIAGNOSIS — Z45010 Encounter for checking and testing of cardiac pacemaker pulse generator [battery]: Secondary | ICD-10-CM

## 2019-09-10 ENCOUNTER — Encounter (HOSPITAL_COMMUNITY): Payer: Medicare Other

## 2019-09-10 ENCOUNTER — Ambulatory Visit (HOSPITAL_COMMUNITY): Admit: 2019-09-10 | Payer: Medicare Other | Admitting: Cardiovascular Disease

## 2019-09-10 ENCOUNTER — Other Ambulatory Visit: Payer: Self-pay

## 2019-09-10 ENCOUNTER — Ambulatory Visit (HOSPITAL_COMMUNITY)
Admission: RE | Admit: 2019-09-10 | Discharge: 2019-09-10 | Disposition: A | Discharge status: Home / Self Care | Payer: Medicare Other | Attending: Cardiovascular Disease | Admitting: Cardiovascular Disease

## 2019-09-10 ENCOUNTER — Encounter (HOSPITAL_COMMUNITY)
Admission: RE | Disposition: A | Discharge status: Home / Self Care | Payer: Self-pay | Source: Home / Self Care | Attending: Cardiovascular Disease

## 2019-09-10 DIAGNOSIS — Z95810 Presence of automatic (implantable) cardiac defibrillator: Secondary | ICD-10-CM | POA: Diagnosis not present

## 2019-09-10 DIAGNOSIS — Z8249 Family history of ischemic heart disease and other diseases of the circulatory system: Secondary | ICD-10-CM | POA: Diagnosis not present

## 2019-09-10 DIAGNOSIS — N179 Acute kidney failure, unspecified: Secondary | ICD-10-CM | POA: Insufficient documentation

## 2019-09-10 DIAGNOSIS — I110 Hypertensive heart disease with heart failure: Secondary | ICD-10-CM | POA: Insufficient documentation

## 2019-09-10 DIAGNOSIS — G629 Polyneuropathy, unspecified: Secondary | ICD-10-CM | POA: Diagnosis not present

## 2019-09-10 DIAGNOSIS — Z4502 Encounter for adjustment and management of automatic implantable cardiac defibrillator: Secondary | ICD-10-CM | POA: Diagnosis not present

## 2019-09-10 DIAGNOSIS — E079 Disorder of thyroid, unspecified: Secondary | ICD-10-CM | POA: Diagnosis not present

## 2019-09-10 DIAGNOSIS — Z7982 Long term (current) use of aspirin: Secondary | ICD-10-CM | POA: Diagnosis not present

## 2019-09-10 DIAGNOSIS — I5032 Chronic diastolic (congestive) heart failure: Secondary | ICD-10-CM

## 2019-09-10 DIAGNOSIS — I429 Cardiomyopathy, unspecified: Secondary | ICD-10-CM | POA: Insufficient documentation

## 2019-09-10 DIAGNOSIS — Z79899 Other long term (current) drug therapy: Secondary | ICD-10-CM | POA: Diagnosis not present

## 2019-09-10 DIAGNOSIS — I5042 Chronic combined systolic (congestive) and diastolic (congestive) heart failure: Secondary | ICD-10-CM | POA: Insufficient documentation

## 2019-09-10 DIAGNOSIS — Z006 Encounter for examination for normal comparison and control in clinical research program: Secondary | ICD-10-CM | POA: Diagnosis not present

## 2019-09-10 DIAGNOSIS — Z45010 Encounter for checking and testing of cardiac pacemaker pulse generator [battery]: Secondary | ICD-10-CM | POA: Insufficient documentation

## 2019-09-10 DIAGNOSIS — I428 Other cardiomyopathies: Secondary | ICD-10-CM

## 2019-09-10 HISTORY — PX: BIV PACEMAKER GENERATOR CHANGEOUT: EP1198

## 2019-09-10 LAB — BASIC METABOLIC PANEL
Anion gap: 8 (ref 5–15)
BUN: 20 mg/dL (ref 8–23)
CO2: 31 mmol/L (ref 22–32)
Calcium: 9.1 mg/dL (ref 8.9–10.3)
Chloride: 95 mmol/L — ABNORMAL LOW (ref 98–111)
Creatinine, Ser: 0.89 mg/dL (ref 0.44–1.00)
GFR calc Af Amer: >60 mL/min (ref >60)
GFR calc non Af Amer: 57 mL/min — ABNORMAL LOW (ref >60)
Glucose, Bld: 134 mg/dL — ABNORMAL HIGH (ref 70–99)
Potassium: 4.4 mmol/L (ref 3.5–5.1)
Sodium: 134 mmol/L — ABNORMAL LOW (ref 135–145)

## 2019-09-10 LAB — CBC
HCT: 43.8 % (ref 36.0–46.0)
Hemoglobin: 13.4 g/dL (ref 12.0–15.0)
MCH: 28.2 pg (ref 26.0–34.0)
MCHC: 30.6 g/dL (ref 30.0–36.0)
MCV: 92.2 fL (ref 80.0–100.0)
Platelets: 157 10*3/uL (ref 150–400)
RBC: 4.75 MIL/uL (ref 3.87–5.11)
RDW: 12.9 % (ref 11.5–15.5)
WBC: 6.1 10*3/uL (ref 4.0–10.5)
nRBC: 0 % (ref 0.0–0.2)

## 2019-09-10 LAB — GLUCOSE, CAPILLARY: Glucose-Capillary: 110 mg/dL — ABNORMAL HIGH (ref 70–99)

## 2019-09-10 SURGERY — BIV PACEMAKER GENERATOR CHANGEOUT

## 2019-09-10 SURGERY — ICD GENERATOR CHANGEOUT

## 2019-09-10 MED ORDER — SODIUM CHLORIDE 0.9 % IV SOLN
INTRAVENOUS | Status: AC
  Filled 2019-09-10: qty 2

## 2019-09-10 MED ORDER — CEFAZOLIN SODIUM-DEXTROSE 2-4 GM/100ML-% IV SOLN
INTRAVENOUS | Status: AC
  Filled 2019-09-10: qty 100

## 2019-09-10 MED ORDER — CEFAZOLIN SODIUM-DEXTROSE 2-4 GM/100ML-% IV SOLN
2.0000 g | INTRAVENOUS | Status: AC
  Administered 2019-09-10: 2 g via INTRAVENOUS
  Filled 2019-09-10: qty 100

## 2019-09-10 MED ORDER — LIDOCAINE HCL (PF) 1 % IJ SOLN
INTRAMUSCULAR | Status: DC | PRN
  Administered 2019-09-10: 40 mL

## 2019-09-10 MED ORDER — SODIUM CHLORIDE 0.9 % IV SOLN
80.0000 mg | INTRAVENOUS | Status: AC
  Administered 2019-09-10: 80 mg
  Filled 2019-09-10: qty 2

## 2019-09-10 MED ORDER — HEPARIN (PORCINE) IN NACL 1000-0.9 UT/500ML-% IV SOLN
INTRAVENOUS | Status: DC | PRN
  Administered 2019-09-10: 500 mL

## 2019-09-10 MED ORDER — SODIUM CHLORIDE 0.9 % IV SOLN: INTRAVENOUS | Status: DC

## 2019-09-10 MED ORDER — CHLORHEXIDINE GLUCONATE 4 % EX LIQD
4.0000 "application " | Freq: Once | CUTANEOUS | Status: DC
  Filled 2019-09-10: qty 60

## 2019-09-10 SURGICAL SUPPLY — 7 items
CABLE SURGICAL S-101-97-12 (CABLE) ×2 IMPLANT
PACEMAKER PRCT MRI CRTP W1TR01 (Pacemaker) IMPLANT
PAD PRO RADIOLUCENT 2001M-C (PAD) ×2 IMPLANT
POUCH AIGIS-R ANTIBACT ICD (Mesh General) ×2 IMPLANT
POUCH AIGIS-R ANTIBACT ICD LRG (Mesh General) IMPLANT
PPM PRECEPTA MRI CRT-P W1TR01 (Pacemaker) ×2 IMPLANT
TRAY PACEMAKER INSERTION (PACKS) ×2 IMPLANT

## 2019-09-10 NOTE — Discharge Instructions (Signed)

## 2019-09-10 NOTE — Interval H&P Note (Signed)
History and Physical Interval Note:  09/10/2019 1:49 PM  [name]  has presented today for surgery, with the diagnosis of ERI.  The various methods of treatment have been discussed with the patient and family. After consideration of risks, benefits and other options for treatment, the patient has consented to  Procedure(s): [facility] (N/A) as a surgical intervention.  The patient's history has been reviewed, patient examined, no change in status, stable for surgery.  I have reviewed the patient's chart and labs.  Questions were answered to the patient's satisfaction.     Etosha Wetherell

## 2019-09-10 NOTE — Op Note (Signed)
Procedure report  Procedure performed:  1. Dual chamber generator changeout (downgrade CRT-D to CRT-P)  Reason for procedure:  1. Device generator at elective replacement interval  2. Chronic combined systolic and diastolic heart failure Procedure performed by:  Sanda Klein, MD  Complications:  None  Estimated blood loss:  <5 mL  Medications administered during procedure:  Ancef 2 g intravenously, lidocaine 1% 30 mL locally Device details:   New Generator Medtronic Percepta CRT-P MRI model number [id number], serial number [id number] Right atrial lead (chronic) Medtronic [id number], serial number [id number] (implanted 06/27/2007) Right ventricular lead (chronic)  Medtronic [id number], serial number [id number] (implanted 06/27/2007) Left ventricular lead  Medtronic [id number], serial number [id number] (implanted 06/28/2007) Explanted generator Medtronic Viva XT [id number], serial number  [id number] (implanted 08/10/2013)  Procedure details:  After the risks and benefits of the procedure were discussed the patient provided informed consent. She was brought to the cardiac catheter lab in the fasting state. The patient was prepped and draped in usual sterile fashion. Local anesthesia with 1% lidocaine was administered to to the left infraclavicular area. A 5-6cm horizontal incision was made parallel with and 2-3 cm caudal to the left clavicle, in the area of an old scar. An older scar was seen closer to the left clavicle. Using minimal electrocautery and mostly sharp and blunt dissection the prepectoral pocket was opened carefully to avoid injury to the loops of chronic leads. Extensive dissection was not necessary. The device was explanted. The pocket was carefully inspected for hemostasis and flushed with copious amounts of antibiotic solution.  The leads were disconnected from the old generator and testing of the lead parameters later showed excellent values. The new generator was connected to the chronic  leads, with appropriate pacing noted. The ICD coil shock tips were capped. The system was placed in an Aegis antibiotic resorbable pouch.  The entire system was then carefully inserted in the pocket with care been taking that the leads and device assumed a comfortable position without pressure on the incision. Great care was taken that the leads be located deep to the generator. The pocket was then closed in layers using 2 layers of 2-0 Vicryl and one layer of 3-0 Vicryl, after which SteriStrips and a sterile dressing was applied.   At the end of the procedure the following lead parameters were encountered:   Right atrial lead sensed P waves 2.6 mV, impedance 608 ohms, threshold 1.0 at 0.4 ms pulse width.  Right ventricular lead sensed R waves  19 mV, impedance 551 ohms, threshold 1.5 at 0.8 ms pulse width.  Left ventricular lead impedance 513 ohms, threshold 1.25 at 0.8 ms pulse width.

## 2019-09-10 NOTE — Progress Notes (Signed)
Patient and daughter were given discharge instructions. Both verbalized understanding.

## 2019-09-11 ENCOUNTER — Encounter (HOSPITAL_COMMUNITY): Payer: Self-pay | Admitting: Cardiovascular Disease

## 2019-09-13 ENCOUNTER — Encounter (HOSPITAL_COMMUNITY): Payer: Self-pay | Admitting: Cardiovascular Disease

## 2019-09-17 ENCOUNTER — Telehealth: Payer: Self-pay

## 2019-09-17 NOTE — Telephone Encounter (Signed)
The pt daughter calling upset because the pt has to have a wound check. She thinks that the dr don't trust her judgment. I let her speak with Cindy, rn.

## 2019-09-17 NOTE — Telephone Encounter (Signed)
Spoke with Ms Balinda Quails  and explained that wound check appointment was necessary for device clinic staff to remove steri-strips and to assess wound site. Education done on device interogation with new devices and that it is clinic protocol that all patients be seen after generator changes and new implants. 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

## 2019-09-20 ENCOUNTER — Ambulatory Visit (INDEPENDENT_AMBULATORY_CARE_PROVIDER_SITE_OTHER): Payer: Medicare Other | Admitting: Emergency Medicine

## 2019-09-20 ENCOUNTER — Other Ambulatory Visit: Payer: Self-pay

## 2019-09-20 DIAGNOSIS — I428 Other cardiomyopathies: Secondary | ICD-10-CM

## 2019-09-20 NOTE — Patient Instructions (Signed)
Keep incision dry until follow up visit 10/02/19. Call the office if the wound site has any drainage, redness, or if you develop a fever or chills.

## 2019-10-02 ENCOUNTER — Ambulatory Visit (INDEPENDENT_AMBULATORY_CARE_PROVIDER_SITE_OTHER): Payer: Medicare Other | Admitting: Emergency Medicine

## 2019-10-02 ENCOUNTER — Other Ambulatory Visit: Payer: Self-pay

## 2019-10-02 DIAGNOSIS — G894 Chronic pain syndrome: Secondary | ICD-10-CM | POA: Diagnosis not present

## 2019-10-02 DIAGNOSIS — I428 Other cardiomyopathies: Secondary | ICD-10-CM

## 2019-10-02 DIAGNOSIS — I5032 Chronic diastolic (congestive) heart failure: Secondary | ICD-10-CM

## 2019-10-02 LAB — CUP PACEART INCLINIC DEVICE CHECK
Battery Remaining Longevity: 138 mo
Battery Voltage: 3.2 V
Brady Statistic AP VP Percent: 5 %
Brady Statistic AP VS Percent: 0.1 %
Brady Statistic AS VP Percent: 89.59 %
Brady Statistic AS VS Percent: 5.31 %
Brady Statistic RA Percent Paced: 5.05 %
Brady Statistic RV Percent Paced: 0.89 %
Date Time Interrogation Session: 20210706170035
Implantable Lead Implant Date: 20090331
Implantable Lead Implant Date: 20090331
Implantable Lead Implant Date: 20090401
Implantable Lead Location: 753858
Implantable Lead Location: 753859
Implantable Lead Location: 753860
Implantable Lead Model: 4194
Implantable Lead Model: 5076
Implantable Lead Model: 6947
Implantable Pulse Generator Implant Date: 20210614
Lead Channel Impedance Value: 380 Ohm
Lead Channel Impedance Value: 456 Ohm
Lead Channel Impedance Value: 494 Ohm
Lead Channel Impedance Value: 513 Ohm
Lead Channel Impedance Value: 532 Ohm
Lead Channel Impedance Value: 608 Ohm
Lead Channel Impedance Value: 646 Ohm
Lead Channel Impedance Value: 646 Ohm
Lead Channel Impedance Value: 855 Ohm
Lead Channel Pacing Threshold Amplitude: 0.875 V
Lead Channel Pacing Threshold Amplitude: 1.125 V
Lead Channel Pacing Threshold Amplitude: 1.625 V
Lead Channel Pacing Threshold Pulse Width: 0.4 ms
Lead Channel Pacing Threshold Pulse Width: 0.4 ms
Lead Channel Pacing Threshold Pulse Width: 0.8 ms
Lead Channel Sensing Intrinsic Amplitude: 16.5 mV
Lead Channel Sensing Intrinsic Amplitude: 17.625 mV
Lead Channel Sensing Intrinsic Amplitude: 2.75 mV
Lead Channel Sensing Intrinsic Amplitude: 3.125 mV
Lead Channel Setting Pacing Amplitude: 1.75 V
Lead Channel Setting Pacing Amplitude: 1.75 V
Lead Channel Setting Pacing Amplitude: 2.5 V
Lead Channel Setting Pacing Pulse Width: 0.8 ms
Lead Channel Setting Pacing Pulse Width: 0.8 ms
Lead Channel Setting Sensing Sensitivity: 1.2 mV

## 2019-10-02 NOTE — Progress Notes (Signed)
Call the clinic when you are with home remote monitor to provide serial number on bottom of device.  [id number]

## 2019-10-02 NOTE — Progress Notes (Signed)
Wound recheck appointment. Steri-strips removed at previous visit. Wound without redness or edema. Incision edges approximated, wound well healed. Area previously noted to be of concern has closed.  Device check completed with industry.  Normal device function. Patient bi-ventricularly pacing effectively 94.6% of the time. Device programmed with appropriate safety margins for stable leads. Thresholds, sensing, and impedances consistent with implant measurements. Histogram distribution appropriate for patient and level of activity. No mode switches or ventricular arrhythmias noted. Patient educated about wound care, arm mobility, lifting restrictions. ROV 12/24/19 with Dr. Sallyanne Kuster.  Remote transmission have not been received, patient to call office with Serial number for remote monitor.  Next scheduled transmission 12/17/19.

## 2019-10-02 NOTE — Patient Instructions (Addendum)
Call the clinic when you are with home remote monitor to provide serial number on bottom of device.  [phone]

## 2019-10-03 LAB — CUP PACEART INCLINIC DEVICE CHECK
Battery Remaining Longevity: 103 mo
Battery Voltage: 3.2 V
Brady Statistic AP VP Percent: 2.2 %
Brady Statistic AP VS Percent: 0.05 %
Brady Statistic AS VP Percent: 94.39 %
Brady Statistic AS VS Percent: 3.37 %
Brady Statistic RA Percent Paced: 2.27 %
Brady Statistic RV Percent Paced: 1.89 %
Date Time Interrogation Session: 20210624143300
Implantable Lead Implant Date: 20090331
Implantable Lead Implant Date: 20090331
Implantable Lead Implant Date: 20090401
Implantable Lead Location: 753858
Implantable Lead Location: 753859
Implantable Lead Location: 753860
Implantable Lead Model: 4194
Implantable Lead Model: 5076
Implantable Lead Model: 6947
Implantable Pulse Generator Implant Date: 20210614
Lead Channel Impedance Value: 380 Ohm
Lead Channel Impedance Value: 437 Ohm
Lead Channel Impedance Value: 456 Ohm
Lead Channel Impedance Value: 475 Ohm
Lead Channel Impedance Value: 494 Ohm
Lead Channel Impedance Value: 532 Ohm
Lead Channel Impedance Value: 627 Ohm
Lead Channel Impedance Value: 646 Ohm
Lead Channel Impedance Value: 836 Ohm
Lead Channel Pacing Threshold Amplitude: 0.5 V
Lead Channel Pacing Threshold Amplitude: 1.125 V
Lead Channel Pacing Threshold Amplitude: 1.5 V
Lead Channel Pacing Threshold Pulse Width: 0.4 ms
Lead Channel Pacing Threshold Pulse Width: 0.4 ms
Lead Channel Pacing Threshold Pulse Width: 0.8 ms
Lead Channel Sensing Intrinsic Amplitude: 17.625 mV
Lead Channel Sensing Intrinsic Amplitude: 3 mV
Lead Channel Setting Pacing Amplitude: 1.5 V
Lead Channel Setting Pacing Amplitude: 2.5 V
Lead Channel Setting Pacing Amplitude: 2.75 V
Lead Channel Setting Pacing Pulse Width: 0.8 ms
Lead Channel Setting Pacing Pulse Width: 0.8 ms
Lead Channel Setting Sensing Sensitivity: 1.2 mV

## 2019-10-03 NOTE — Progress Notes (Signed)
Wound check appointment gen change to CRT-P from CRT-D. Steri-strips removed. Wound without redness or edema. Incision edges approximated with exception of small area at distal edge of incision, steri strips reapplied to unapproximated area . Normal device function. Thresholds, sensing, and impedances consistent with implant measurements. Device programmed at chronic settings due to mature leads.[name] distribution appropriate for patient and level of activity. No mode switches or high ventricular rates noted. Patient educated about wound care. ROV for wound check  10/02/19. Follow up with Dr Recardo Evangelist 9/20/2. Enrolled in remote follow up and next remote scheduled for 12/17/19.

## 2019-11-05 DIAGNOSIS — H401133 Primary open-angle glaucoma, bilateral, severe stage: Secondary | ICD-10-CM | POA: Diagnosis not present

## 2019-12-17 ENCOUNTER — Encounter: Payer: Medicare Other | Admitting: Cardiovascular Disease

## 2019-12-24 ENCOUNTER — Encounter: Payer: Medicare Other | Admitting: Cardiovascular Disease

## 2020-01-29 ENCOUNTER — Other Ambulatory Visit: Payer: Self-pay

## 2020-01-29 MED ORDER — AMLODIPINE BESYLATE 5 MG PO TABS: 5.0000 mg | ORAL_TABLET | Freq: Every day | ORAL | 2 refills | Status: DC

## 2020-02-11 ENCOUNTER — Emergency Department (HOSPITAL_COMMUNITY): Payer: Medicare Other

## 2020-02-11 ENCOUNTER — Encounter (HOSPITAL_COMMUNITY): Payer: Self-pay

## 2020-02-11 ENCOUNTER — Other Ambulatory Visit: Payer: Self-pay

## 2020-02-11 ENCOUNTER — Inpatient Hospital Stay (HOSPITAL_COMMUNITY)
Admission: EM | Admit: 2020-02-11 | Discharge: 2020-02-18 | DRG: 291 | Disposition: A | Discharge status: Skilled Nursing Facility | Payer: Medicare Other | Attending: Internal Medicine | Admitting: Internal Medicine

## 2020-02-11 DIAGNOSIS — Y92009 Unspecified place in unspecified non-institutional (private) residence as the place of occurrence of the external cause: Secondary | ICD-10-CM

## 2020-02-11 DIAGNOSIS — Z950 Presence of cardiac pacemaker: Secondary | ICD-10-CM | POA: Diagnosis not present

## 2020-02-11 DIAGNOSIS — J9621 Acute and chronic respiratory failure with hypoxia: Secondary | ICD-10-CM | POA: Diagnosis present

## 2020-02-11 DIAGNOSIS — W19XXXA Unspecified fall, initial encounter: Secondary | ICD-10-CM

## 2020-02-11 DIAGNOSIS — F32A Depression, unspecified: Secondary | ICD-10-CM | POA: Diagnosis not present

## 2020-02-11 DIAGNOSIS — I10 Essential (primary) hypertension: Secondary | ICD-10-CM

## 2020-02-11 DIAGNOSIS — I110 Hypertensive heart disease with heart failure: Secondary | ICD-10-CM | POA: Diagnosis not present

## 2020-02-11 DIAGNOSIS — Z9181 History of falling: Secondary | ICD-10-CM | POA: Diagnosis not present

## 2020-02-11 DIAGNOSIS — Z95810 Presence of automatic (implantable) cardiac defibrillator: Secondary | ICD-10-CM | POA: Diagnosis not present

## 2020-02-11 DIAGNOSIS — R600 Localized edema: Secondary | ICD-10-CM | POA: Diagnosis not present

## 2020-02-11 DIAGNOSIS — S82852D Displaced trimalleolar fracture of left lower leg, subsequent encounter for closed fracture with routine healing: Secondary | ICD-10-CM | POA: Diagnosis not present

## 2020-02-11 DIAGNOSIS — I5032 Chronic diastolic (congestive) heart failure: Secondary | ICD-10-CM | POA: Diagnosis not present

## 2020-02-11 DIAGNOSIS — Z743 Need for continuous supervision: Secondary | ICD-10-CM | POA: Diagnosis not present

## 2020-02-11 DIAGNOSIS — S92001A Unspecified fracture of right calcaneus, initial encounter for closed fracture: Secondary | ICD-10-CM | POA: Diagnosis present

## 2020-02-11 DIAGNOSIS — Z20822 Contact with and (suspected) exposure to covid-19: Secondary | ICD-10-CM | POA: Diagnosis present

## 2020-02-11 DIAGNOSIS — Z801 Family history of malignant neoplasm of trachea, bronchus and lung: Secondary | ICD-10-CM

## 2020-02-11 DIAGNOSIS — M7989 Other specified soft tissue disorders: Secondary | ICD-10-CM | POA: Diagnosis not present

## 2020-02-11 DIAGNOSIS — S92001D Unspecified fracture of right calcaneus, subsequent encounter for fracture with routine healing: Secondary | ICD-10-CM | POA: Diagnosis not present

## 2020-02-11 DIAGNOSIS — G629 Polyneuropathy, unspecified: Secondary | ICD-10-CM | POA: Diagnosis not present

## 2020-02-11 DIAGNOSIS — W1830XA Fall on same level, unspecified, initial encounter: Secondary | ICD-10-CM | POA: Diagnosis present

## 2020-02-11 DIAGNOSIS — I420 Dilated cardiomyopathy: Secondary | ICD-10-CM | POA: Diagnosis present

## 2020-02-11 DIAGNOSIS — M810 Age-related osteoporosis without current pathological fracture: Secondary | ICD-10-CM | POA: Diagnosis not present

## 2020-02-11 DIAGNOSIS — R41841 Cognitive communication deficit: Secondary | ICD-10-CM | POA: Diagnosis not present

## 2020-02-11 DIAGNOSIS — Z79890 Hormone replacement therapy (postmenopausal): Secondary | ICD-10-CM

## 2020-02-11 DIAGNOSIS — Z4502 Encounter for adjustment and management of automatic implantable cardiac defibrillator: Secondary | ICD-10-CM

## 2020-02-11 DIAGNOSIS — E039 Hypothyroidism, unspecified: Secondary | ICD-10-CM | POA: Diagnosis present

## 2020-02-11 DIAGNOSIS — R279 Unspecified lack of coordination: Secondary | ICD-10-CM | POA: Diagnosis not present

## 2020-02-11 DIAGNOSIS — Z79891 Long term (current) use of opiate analgesic: Secondary | ICD-10-CM | POA: Diagnosis not present

## 2020-02-11 DIAGNOSIS — R059 Cough, unspecified: Secondary | ICD-10-CM | POA: Diagnosis not present

## 2020-02-11 DIAGNOSIS — F0390 Unspecified dementia without behavioral disturbance: Secondary | ICD-10-CM | POA: Diagnosis present

## 2020-02-11 DIAGNOSIS — M6281 Muscle weakness (generalized): Secondary | ICD-10-CM | POA: Diagnosis not present

## 2020-02-11 DIAGNOSIS — R9431 Abnormal electrocardiogram [ECG] [EKG]: Secondary | ICD-10-CM

## 2020-02-11 DIAGNOSIS — Y92012 Bathroom of single-family (private) house as the place of occurrence of the external cause: Secondary | ICD-10-CM

## 2020-02-11 DIAGNOSIS — Z7983 Long term (current) use of bisphosphonates: Secondary | ICD-10-CM

## 2020-02-11 DIAGNOSIS — Z760 Encounter for issue of repeat prescription: Secondary | ICD-10-CM | POA: Diagnosis not present

## 2020-02-11 DIAGNOSIS — S92901A Unspecified fracture of right foot, initial encounter for closed fracture: Secondary | ICD-10-CM | POA: Diagnosis not present

## 2020-02-11 DIAGNOSIS — I5033 Acute on chronic diastolic (congestive) heart failure: Secondary | ICD-10-CM

## 2020-02-11 DIAGNOSIS — R531 Weakness: Secondary | ICD-10-CM | POA: Diagnosis not present

## 2020-02-11 DIAGNOSIS — J449 Chronic obstructive pulmonary disease, unspecified: Secondary | ICD-10-CM | POA: Diagnosis present

## 2020-02-11 DIAGNOSIS — I509 Heart failure, unspecified: Secondary | ICD-10-CM

## 2020-02-11 DIAGNOSIS — Z8249 Family history of ischemic heart disease and other diseases of the circulatory system: Secondary | ICD-10-CM

## 2020-02-11 DIAGNOSIS — E785 Hyperlipidemia, unspecified: Secondary | ICD-10-CM | POA: Diagnosis not present

## 2020-02-11 DIAGNOSIS — J189 Pneumonia, unspecified organism: Secondary | ICD-10-CM | POA: Diagnosis not present

## 2020-02-11 DIAGNOSIS — S92031A Displaced avulsion fracture of tuberosity of right calcaneus, initial encounter for closed fracture: Secondary | ICD-10-CM | POA: Diagnosis not present

## 2020-02-11 DIAGNOSIS — Z7982 Long term (current) use of aspirin: Secondary | ICD-10-CM | POA: Diagnosis not present

## 2020-02-11 DIAGNOSIS — S82852A Displaced trimalleolar fracture of left lower leg, initial encounter for closed fracture: Secondary | ICD-10-CM | POA: Diagnosis present

## 2020-02-11 DIAGNOSIS — M80872A Other osteoporosis with current pathological fracture, left ankle and foot, initial encounter for fracture: Secondary | ICD-10-CM | POA: Diagnosis present

## 2020-02-11 DIAGNOSIS — S82892A Other fracture of left lower leg, initial encounter for closed fracture: Secondary | ICD-10-CM

## 2020-02-11 DIAGNOSIS — Z79899 Other long term (current) drug therapy: Secondary | ICD-10-CM | POA: Diagnosis not present

## 2020-02-11 DIAGNOSIS — R404 Transient alteration of awareness: Secondary | ICD-10-CM | POA: Diagnosis not present

## 2020-02-11 DIAGNOSIS — H409 Unspecified glaucoma: Secondary | ICD-10-CM | POA: Diagnosis not present

## 2020-02-11 DIAGNOSIS — J9611 Chronic respiratory failure with hypoxia: Secondary | ICD-10-CM | POA: Diagnosis not present

## 2020-02-11 DIAGNOSIS — T148XXA Other injury of unspecified body region, initial encounter: Secondary | ICD-10-CM

## 2020-02-11 DIAGNOSIS — Z9981 Dependence on supplemental oxygen: Secondary | ICD-10-CM | POA: Diagnosis not present

## 2020-02-11 DIAGNOSIS — R0602 Shortness of breath: Secondary | ICD-10-CM | POA: Diagnosis not present

## 2020-02-11 DIAGNOSIS — I517 Cardiomegaly: Secondary | ICD-10-CM | POA: Diagnosis not present

## 2020-02-11 DIAGNOSIS — I5031 Acute diastolic (congestive) heart failure: Secondary | ICD-10-CM | POA: Diagnosis not present

## 2020-02-11 HISTORY — DX: Chronic obstructive pulmonary disease, unspecified: J44.9

## 2020-02-11 HISTORY — DX: Hypothyroidism, unspecified: E03.9

## 2020-02-11 LAB — URINALYSIS, ROUTINE W REFLEX MICROSCOPIC
Bilirubin Urine: NEGATIVE
Glucose, UA: NEGATIVE mg/dL
Hgb urine dipstick: NEGATIVE
Ketones, ur: 5 mg/dL — AB
Nitrite: NEGATIVE
Protein, ur: 30 mg/dL — AB
Specific Gravity, Urine: 1.025 (ref 1.005–1.030)
pH: 5 (ref 5.0–8.0)

## 2020-02-11 LAB — PROTIME-INR
INR: 1.1 (ref 0.8–1.2)
Prothrombin Time: 13.3 seconds (ref 11.4–15.2)

## 2020-02-11 LAB — COMPREHENSIVE METABOLIC PANEL
ALT: 37 U/L (ref 0–44)
AST: 30 U/L (ref 15–41)
Albumin: 3.4 g/dL — ABNORMAL LOW (ref 3.5–5.0)
Alkaline Phosphatase: 97 U/L (ref 38–126)
Anion gap: 10 (ref 5–15)
BUN: 28 mg/dL — ABNORMAL HIGH (ref 8–23)
CO2: 28 mmol/L (ref 22–32)
Calcium: 9 mg/dL (ref 8.9–10.3)
Chloride: 93 mmol/L — ABNORMAL LOW (ref 98–111)
Creatinine, Ser: 0.86 mg/dL (ref 0.44–1.00)
GFR, Estimated: >60 mL/min (ref >60)
Glucose, Bld: 121 mg/dL — ABNORMAL HIGH (ref 70–99)
Potassium: 4.6 mmol/L (ref 3.5–5.1)
Sodium: 131 mmol/L — ABNORMAL LOW (ref 135–145)
Total Bilirubin: 0.5 mg/dL (ref 0.3–1.2)
Total Protein: 8 g/dL (ref 6.5–8.1)

## 2020-02-11 LAB — CBC
HCT: 45.2 % (ref 36.0–46.0)
Hemoglobin: 13.7 g/dL (ref 12.0–15.0)
MCH: 29.5 pg (ref 26.0–34.0)
MCHC: 30.3 g/dL (ref 30.0–36.0)
MCV: 97.4 fL (ref 80.0–100.0)
Platelets: 211 10*3/uL (ref 150–400)
RBC: 4.64 MIL/uL (ref 3.87–5.11)
RDW: 13.4 % (ref 11.5–15.5)
WBC: 11.4 10*3/uL — ABNORMAL HIGH (ref 4.0–10.5)
nRBC: 0 % (ref 0.0–0.2)

## 2020-02-11 LAB — TROPONIN I (HIGH SENSITIVITY)
Troponin I (High Sensitivity): 12 ng/L (ref <18)
Troponin I (High Sensitivity): 13 ng/L (ref <18)

## 2020-02-11 LAB — BRAIN NATRIURETIC PEPTIDE: B Natriuretic Peptide: 226.8 pg/mL — ABNORMAL HIGH (ref 0.0–100.0)

## 2020-02-11 MED ORDER — OXYCODONE HCL 5 MG PO TABS
15.0000 mg | ORAL_TABLET | Freq: Four times a day (QID) | ORAL | Status: DC | PRN
  Administered 2020-02-11 – 2020-02-18 (×18): 15 mg via ORAL
  Filled 2020-02-11 (×18): qty 3

## 2020-02-11 MED ORDER — OXYCODONE HCL 5 MG PO TABS
15.0000 mg | ORAL_TABLET | Freq: Once | ORAL | Status: AC
  Administered 2020-02-11: 15 mg via ORAL
  Filled 2020-02-11: qty 3

## 2020-02-11 NOTE — ED Notes (Signed)
Pt's daughter Melvena Vink updated. Daughter would like a call when pt is assigned a bed. Her phone number is [phone]

## 2020-02-11 NOTE — ED Triage Notes (Signed)
Patient BIB [facility]. Last night started with weakness in lower legs and had 2 falls this morning. Falls caused because of weakness in legs. No loss of consciousness. Reports no fever, chills. Denies new onset SOB, chest pain. Patient said she has been short of breath for 6 years. Patient has a pacemaker and does not know when it was last checked to work. Patient is urinary incontinent and has not been taking her medications for hypertension.   EMS vitals BP 180/100 HR 70 CGB 146 RR 18

## 2020-02-11 NOTE — Progress Notes (Signed)
Orthopedic Tech Progress Note Patient Details:  [name] [birth year] [id number]  Ortho Devices Type of Ortho Device: Ace wrap, Stirrup splint, Post (short leg) splint Ortho Device/Splint Location: LLE Ortho Device/Splint Interventions: Ordered, Application   Post Interventions Patient Tolerated: Well Instructions Provided: Care of device   Braulio Bosch 02/11/2020, 7:53 PM

## 2020-02-11 NOTE — H&P (Signed)
History and Physical    TRINETTA ALEMU JJK:[id number] DOB: May 18, 1928 DOA: 02/11/2020  PCP: Aurea Graff, PA-C (Inactive)   Patient coming from:   Home  Chief Complaint: SOB, fall at  Home, left ankle pain  HPI: [name] is a 86 y.o. female with medical history significant for with a history of CHF, COPD, hypothyroidism, s/p AICD who presented to the ED with chief complaint of fall at home and unable to bear weight on left leg.  She reports that she fell once last night and then again today.  She reports that she went to stand up and she felt like her knees just buckled and she fell to the ground.  She did experience sharp pain in her left ankle and foot and was unable to stand up and bear weight after the fall.  She states she fell onto a carpeted floor.  She did hit her head on the floor when she fell but had no loss of consciousness.  She reports that for the last few days she has been having increasing shortness of breath that is worsened with exertion.  She does not use oxygen at home.  She has been using her albuterol and Dulera inhalers as prescribed.  She has not had any fever or cough. She denies any chest pain, chest pressure or palpitations.  Has noted that she has had mildly increased swelling of her baseline edema of her legs the last few days.  She denies any dysuria or urinary frequency.  Reports has been taking her medications as prescribed. She lives with her daughter and granddaughter.  Denies tobacco, alcohol, illicit drug use   ED Course: Patient have a mildly elevated BNP with mild interstitial edema on chest x-ray.  She is now requiring oxygen by nasal cannula to maintain O2 sats greater than 90%.  X-ray of her ankle reveals ankle and talus fracture.  Patient was placed in splint in the emergency room.  Review of Systems:  General: Reports generalized weakness. Denies fever, chills, weight loss, night sweats.  Denies dizziness.  Denies change in appetite HENT: Denies  head trauma, headache, denies change in hearing, tinnitus.  Denies nasal congestion or bleeding.  Denies sore throat, sores in mouth.   Eyes: Denies blurry vision, pain in eye, drainage.  Denies discoloration of eyes. Neck: Denies pain.  Denies swelling.  Denies pain with movement. Cardiovascular: Denies chest pain, palpitations. Reports chronic edema of legs.  Denies orthopnea Respiratory: Reports shortness of breath worsened by exertion. Denies cough.  Reports wheezing.  Denies sputum production Gastrointestinal: Denies abdominal pain, swelling.  Denies nausea, vomiting, diarrhea.  Denies melena.  Denies hematemesis. Musculoskeletal: Denies limitation of movement.  Denies deformity or swelling.  Reports pain in left ankle and foot.  Genitourinary: Denies pelvic pain.  Denies urinary frequency or hesitancy.  Denies dysuria.  Skin: Denies rash.  Denies petechiae, purpura, ecchymosis. Neurological: Denies headache. Denies syncope. Denies seizure activity. Denies paresthesia. Denies slurred speech, drooping face. Denies visual change. Psychiatric: Denies depression, anxiety.  Denies suicidal thoughts or ideation.  Denies hallucinations.  Past Medical History:  Diagnosis Date   AICD (automatic cardioverter/defibrillator) present 06/27/2007   medtronic   ARF (acute renal failure) ([place]) 09/2017   Cancer (HCC)    ovarian--stage 4   Cardiomyopathy (HCC)    CHF (congestive heart failure) (HCC)    COPD (chronic obstructive pulmonary disease) (HCC)    Dyspnea    Hypothyroidism    Peripheral neuropathy    Systemic  hypertension    Thyroid disease     Past Surgical History:  Procedure Laterality Date   ABDOMINAL HYSTERECTOMY  oct 2000   along with ovarian ca surg   APPENDECTOMY     BIV PACEMAKER GENERATOR CHANGEOUT N/A 09/10/2019   Procedure: BIV PACEMAKER GENERATOR CHANGEOUT;  Surgeon: Sanda Klein, MD;  Location: [facility];  Service: Cardiovascular;  Laterality: N/A;    CARDIAC CATHETERIZATION  09/27/1990   normal coronaries,dilated cardiomyopathy   CARDIAC DEFIBRILLATOR PLACEMENT  06/27/2007   medtronic concerto   CHOLECYSTECTOMY     IMPLANTABLE CARDIOVERTER DEFIBRILLATOR GENERATOR CHANGE N/A 07/31/2013   Procedure: IMPLANTABLE CARDIOVERTER DEFIBRILLATOR GENERATOR CHANGE;  Surgeon: Sanda Klein, MD;  Location: [facility];  Service: Cardiovascular;  Laterality: N/A;   NM MYOCAR PERF WALL MOTION  06/05/2007   no ischemia    Social History  reports that she has never smoked. She has never used smokeless tobacco. She reports that she does not drink alcohol and does not use drugs.  No Known Allergies  Family History  Problem Relation Age of Onset   Lung cancer Mother        died from cancer   Heart attack Father    Lung cancer Father    Heart disease Paternal Grandmother      Prior to Admission medications   Medication Sig Start Date End Date Taking? Authorizing Provider  albuterol (VENTOLIN HFA) 108 (90 Base) MCG/ACT inhaler Inhale 1-2 puffs into the lungs every 6 (six) hours as needed for wheezing or shortness of breath.   Yes [provider]  alendronate (FOSAMAX) 70 MG tablet Take 70 mg by mouth every Sunday.  09/01/15  Yes [provider]  amLODipine (NORVASC) 5 MG tablet Take 1 tablet (5 mg total) by mouth daily. 01/29/20  Yes Duke, Tami Lin, PA  aspirin EC 81 MG tablet Take 1 tablet (81 mg total) by mouth daily. 10/03/17  Yes Croitoru, Mihai, MD  carvedilol (COREG) 12.5 MG tablet Take 1 tablet (12.5 mg total) by mouth 2 (two) times daily with a meal. 05/07/19  Yes Croitoru, Mihai, MD  Cholecalciferol (VITAMIN D3 PO) Take 1 tablet by mouth once a week.    Yes [provider]  dorzolamide-timolol (COSOPT) 22.3-6.8 MG/ML ophthalmic solution Place 1 drop into both eyes 2 (two) times daily as needed (eye pressure).  12/29/19  Yes [provider]  DULERA 100-5 MCG/ACT AERO Inhale 2 puffs into the lungs every 6  (six) hours as needed for wheezing or shortness of breath.  08/29/19  Yes [provider]  DULoxetine (CYMBALTA) 60 MG capsule Take 60 mg by mouth daily.  08/25/15  Yes [provider]  furosemide (LASIX) 20 MG tablet Take 40 mg by mouth daily.  01/10/20  Yes [provider]  gabapentin (NEURONTIN) 100 MG capsule Take 100-300 mg by mouth at bedtime as needed (pain).    Yes [provider]  liothyronine (CYTOMEL) 25 MCG tablet Take 25 mcg by mouth daily. 07/02/15  Yes [provider]  LUMIGAN 0.01 % SOLN Place 1 drop into both eyes at bedtime as needed (glaucoma).  09/11/19  Yes [provider]  Melatonin 10 MG TABS Take 10 mg by mouth at bedtime.    Yes [provider]  Multiple Vitamin (MULTIVITAMIN WITH MINERALS) TABS tablet Take 1 tablet by mouth daily.   Yes [provider]  oxyCODONE (ROXICODONE) 15 MG immediate release tablet Take 15 mg by mouth every 6 (six) hours.  02/28/15  Yes [provider]  SYNTHROID 112 MCG tablet Take 112 mcg by mouth daily before breakfast.  09/12/15  Yes [provider]  traZODone (DESYREL) 50 MG tablet Take 50 mg by mouth at bedtime as needed for sleep.  01/10/20  Yes [provider]  furosemide (LASIX) 40 MG tablet Take 1 tablet (40 mg total) by mouth daily. Patient not taking: Reported on 02/11/2020 08/16/19   Deboraha Sprang, MD    Physical Exam: Vitals:   02/11/20 1730 02/11/20 1800 02/11/20 1916 02/11/20 2015  BP: (!) 169/87 (!) 182/80 (!) 146/67 (!) 145/102  Pulse: 87 84 86 92  Resp: (!) 22 (!) 22 19 20   Temp:      TempSrc:      SpO2: 94% 95% 95% 92%  Weight:      Height:        Constitutional: NAD, calm, comfortable Vitals:   02/11/20 1730 02/11/20 1800 02/11/20 1916 02/11/20 2015  BP: (!) 169/87 (!) 182/80 (!) 146/67 (!) 145/102  Pulse: 87 84 86 92  Resp: (!) 22 (!) 22 19 20   Temp:      TempSrc:      SpO2: 94% 95% 95% 92%  Weight:      Height:        General: WDWN, Alert and oriented x3. Tired appearing. Eyes: EOMI, PERRL, conjunctivae normal.  Sclera nonicteric HENT:  [place]/AT, external ears normal.  Nares patent without epistasis.  Mucous membranes are moist. Posterior pharynx clear of any exudate or lesions. Neck: Soft, normal range of motion, supple, no masses, no thyromegaly.  Trachea midline Respiratory: Diminished breath sounds bilaterally.  Diffuse scattered Rales and expiratory wheezing noted.  No rhonchi. Normal respiratory effort. No accessory muscle use.  Cardiovascular: Regular rate and rhythm, no murmurs / rubs / gallops.  Mild lower extremity edema.  Abdomen: Soft, no tenderness, nondistended, no rebound or guarding.  No masses palpated. Bowel sounds normoactive Musculoskeletal: FROM upper extremities and right lower extremity.  Left leg and ankle in splint. No cyanosis. Normal muscle tone.  Skin: Warm, dry, intact no rashes, lesions, ulcers. No induration Neurologic: CN 2-12 grossly intact.  Normal speech.  Sensation intact, Grip strength 5/5 bilaterally.Marland Kitchen   Psychiatric: Normal judgment and insight.  Normal mood.    Labs on Admission: I have personally reviewed following labs and imaging studies  CBC: Recent Labs  Lab 02/11/20 1646  WBC 11.4*  HGB 13.7  HCT 45.2  MCV 97.4  PLT 998    Basic Metabolic Panel: Recent Labs  Lab 02/11/20 1646  NA 131*  K 4.6  CL 93*  CO2 28  GLUCOSE 121*  BUN 28*  CREATININE 0.86  CALCIUM 9.0    GFR: Estimated Creatinine Clearance: 39.8 mL/min (by C-G formula based on SCr of 0.86 mg/dL).  Liver Function Tests: Recent Labs  Lab 02/11/20 1646  AST 30  ALT 37  ALKPHOS 97  BILITOT 0.5  PROT 8.0  ALBUMIN 3.4*    Urine analysis:    Component Value Date/Time   COLORURINE AMBER (A) 02/11/2020 1950   APPEARANCEUR HAZY (A) 02/11/2020 1950   LABSPEC 1.025 02/11/2020 1950   PHURINE 5.0 02/11/2020 1950   GLUCOSEU NEGATIVE 02/11/2020 1950   HGBUR NEGATIVE 02/11/2020  1950   BILIRUBINUR NEGATIVE 02/11/2020 1950   KETONESUR 5 (A) 02/11/2020 1950   PROTEINUR 30 (A) 02/11/2020 1950   NITRITE NEGATIVE 02/11/2020 1950   LEUKOCYTESUR TRACE (A) 02/11/2020 1950    Radiological Exams on Admission:  DG Tibia/Fibula Left  Result Date: 02/11/2020 CLINICAL DATA:  Ankle fracture, fall, left lower leg, ankle and foot pain EXAM: LEFT TIBIA AND FIBULA - 2 VIEW LEFT FOOT - COMPLETE 3+ VIEW LEFT ANKLE COMPLETE - 3+ VIEW COMPARISON:  Left ankle radiograph 01/03/2012 FINDINGS: The osseous structures appear diffusely demineralized which may limit detection of small or nondisplaced fractures. Tiny crescentic radiodensity is seen along the posterior corner of the proximal tibia which is favored to be enthesopathic/degenerative in nature though could correlate for point tenderness. No additional proximal to mid tibia or fibular fractures are seen. Diffuse edematous changes of the lower leg are noted with associated skin thickening. Minimally comminuted, mildly displaced trimalleolar fracture of the ankle including an obliquely oriented trans syndesmotic distal fibular fracture (Weber B), a transversely oriented medial malleolar avulsive type fracture, and a slightly superiorly displaced posterior malleolar fracture. Diffuse circumferential soft tissue swelling of the ankle is noted. Suboptimal mortise projection at the level of the ankle may limit complete accurate assessment however the constellation of fracture findings are compatible with a stage IV injury which is unstable. Possible avulsive type fracture seen of the lateral process of the talus and along the proximal lateral aspect of the cuboid. No additional fractures are seen in the foot. Alignment is grossly preserved though incompletely assessed on nonweightbearing view. Background of degenerative change throughout the foot. Bidirectional calcaneal spurs. Suspect enthesopathic changes near the proximal pole of the navicular though  could correlate for point tenderness. IMPRESSION: 1. Diffuse bony demineralization may limit detection of subtle or nondisplaced fractures. 2. Crescentic radiodensity along the posterolateral corner of the tibial plateau. Possibly degenerative though could correlate for point tenderness. 3. Minimally comminuted, mildly displaced trimalleolar fracture of the ankle in a pattern compatible with a Weber B stage IV unstable ankle injury. 4. Suspect small avulsive type fractures seen of the lateral process of the talus and along the proximal lateral aspect of the cuboid. Additional fragmentation along the proximal pole navicular is favored to be enthesopathic though correlate for point tenderness to exclude fracture. Electronically Signed   By: Lovena Le M.D.   On: 02/11/2020 19:10   DG Ankle Complete Left  Result Date: 02/11/2020 CLINICAL DATA:  Ankle fracture, fall, left lower leg, ankle and foot pain EXAM: LEFT TIBIA AND FIBULA - 2 VIEW LEFT FOOT - COMPLETE 3+ VIEW LEFT ANKLE COMPLETE - 3+ VIEW COMPARISON:  Left ankle radiograph 01/03/2012 FINDINGS: The osseous structures appear diffusely demineralized which may limit detection of small or nondisplaced fractures. Tiny crescentic radiodensity is seen along the posterior corner of the proximal tibia which is favored to be enthesopathic/degenerative in nature though could correlate for point tenderness. No additional proximal to mid tibia or fibular fractures are seen. Diffuse edematous changes of the lower leg are noted with associated skin thickening. Minimally comminuted, mildly displaced trimalleolar fracture of the ankle including an obliquely oriented trans syndesmotic distal fibular fracture (Weber B), a transversely oriented medial malleolar avulsive type fracture, and a slightly superiorly displaced posterior malleolar fracture. Diffuse circumferential soft tissue swelling of the ankle is noted. Suboptimal mortise projection at the level of the ankle may  limit complete accurate assessment however the constellation of fracture findings are compatible with a stage IV injury which is unstable. Possible avulsive type fracture seen of the lateral process of the talus and along the proximal lateral aspect of the cuboid. No additional fractures are seen in the foot. Alignment is grossly preserved though incompletely assessed on nonweightbearing view. Background  of degenerative change throughout the foot. Bidirectional calcaneal spurs. Suspect enthesopathic changes near the proximal pole of the navicular though could correlate for point tenderness. IMPRESSION: 1. Diffuse bony demineralization may limit detection of subtle or nondisplaced fractures. 2. Crescentic radiodensity along the posterolateral corner of the tibial plateau. Possibly degenerative though could correlate for point tenderness. 3. Minimally comminuted, mildly displaced trimalleolar fracture of the ankle in a pattern compatible with a Weber B stage IV unstable ankle injury. 4. Suspect small avulsive type fractures seen of the lateral process of the talus and along the proximal lateral aspect of the cuboid. Additional fragmentation along the proximal pole navicular is favored to be enthesopathic though correlate for point tenderness to exclude fracture. Electronically Signed   By: Lovena Le M.D.   On: 02/11/2020 19:10   DG Chest Port 1 View  Result Date: 02/11/2020 CLINICAL DATA:  Shortness of breath EXAM: PORTABLE CHEST 1 VIEW COMPARISON:  March 14, 2019 FINDINGS: The heart size and mediastinal contours mildly enlarged. Aortic knob calcifications are seen. A left-sided pacemaker seen with the lead tips at the right ventricle. Mildly increased interstitial markings are seen throughout both lungs. No acute osseous abnormality. IMPRESSION: Mildly increased interstitial markings which could be due to atelectasis and/or interstitial edema. Electronically Signed   By: Prudencio Pair M.D.   On: 02/11/2020  17:09   DG Foot Complete Left  Result Date: 02/11/2020 CLINICAL DATA:  Ankle fracture, fall, left lower leg, ankle and foot pain EXAM: LEFT TIBIA AND FIBULA - 2 VIEW LEFT FOOT - COMPLETE 3+ VIEW LEFT ANKLE COMPLETE - 3+ VIEW COMPARISON:  Left ankle radiograph 01/03/2012 FINDINGS: The osseous structures appear diffusely demineralized which may limit detection of small or nondisplaced fractures. Tiny crescentic radiodensity is seen along the posterior corner of the proximal tibia which is favored to be enthesopathic/degenerative in nature though could correlate for point tenderness. No additional proximal to mid tibia or fibular fractures are seen. Diffuse edematous changes of the lower leg are noted with associated skin thickening. Minimally comminuted, mildly displaced trimalleolar fracture of the ankle including an obliquely oriented trans syndesmotic distal fibular fracture (Weber B), a transversely oriented medial malleolar avulsive type fracture, and a slightly superiorly displaced posterior malleolar fracture. Diffuse circumferential soft tissue swelling of the ankle is noted. Suboptimal mortise projection at the level of the ankle may limit complete accurate assessment however the constellation of fracture findings are compatible with a stage IV injury which is unstable. Possible avulsive type fracture seen of the lateral process of the talus and along the proximal lateral aspect of the cuboid. No additional fractures are seen in the foot. Alignment is grossly preserved though incompletely assessed on nonweightbearing view. Background of degenerative change throughout the foot. Bidirectional calcaneal spurs. Suspect enthesopathic changes near the proximal pole of the navicular though could correlate for point tenderness. IMPRESSION: 1. Diffuse bony demineralization may limit detection of subtle or nondisplaced fractures. 2. Crescentic radiodensity along the posterolateral corner of the tibial plateau.  Possibly degenerative though could correlate for point tenderness. 3. Minimally comminuted, mildly displaced trimalleolar fracture of the ankle in a pattern compatible with a Weber B stage IV unstable ankle injury. 4. Suspect small avulsive type fractures seen of the lateral process of the talus and along the proximal lateral aspect of the cuboid. Additional fragmentation along the proximal pole navicular is favored to be enthesopathic though correlate for point tenderness to exclude fracture. Electronically Signed   By: Lovena Le M.D.   On:  02/11/2020 19:10    EKG: Independently reviewed.  EKG is reviewed and shows normal sinus rhythm with an interventricular conduction block.  No acute ST elevation or depression.  QTc is prolonged at 512  Assessment/Plan Principal Problem:   Acute on chronic heart failure with preserved ejection fraction (HFpEF) [facility]) Ms. Broberg is admitted to cardiac telemetry floor with acute on chronic HFpEF exacerbation. Diuresis with lasix. Monitor I&Os and daily weight.  Check serial troponin levels during night to rule out ischemic etiology of CHF exacerbation.  Obtain echocardiogram in am for evaluation of wall motion, EF and valvular function.  Continue beta blocker therapy with coreg. Pt is started on Entresto for HFpEF. Pt is not on ACEI or ARB therapy at this time so can initiate therapy.   Active Problems:   COPD (chronic obstructive pulmonary disease) (HCC) Continue dulera bid. Albuterol MDI as needed every 4 hours for SOB, wheezing, cough.  Incentive spirometry every 2 hours while awake.     Essential hypertension Continue home medications of norvasc and coreg. Lisinopril added to regimen. Monitor BP.     Fracture of left ankle, closed, initial encounter Left ankle, foot placed in splint in ER.  Consult Orthopedics for evaluation. Discussed with Dr Lynann Bologna and Ortho will see in am Pain control with oxycodone provided.     Prolonged QT interval Avoid  medications which can further prolong QT interval. Monitor on telemetry.     ICD (implantable cardioverter-defibrillator) battery depletion Stable    Fall at home, initial encounter Consult PT for evaluation with falls and ankle fracture. Await recommendation for home with home health vs inpatient rehab.     DVT prophylaxis: Padua score is elevated. Lovenox for DVT prophylaxis.  Code Status:   Full code  Family Communication:  Diagnosis and plan discussed with patient. She verbalizes understanding and agrees with plan. Further recommendations to follow as clinically indicated Disposition Plan:   Patient is from:  Home  Anticipated DC to:  Will be determined patient can go home versus needing admission to rehab facility  Anticipated DC date:  Anticipate greater than 2 midnight stay in the hospital  Anticipated DC barriers: Barriers to discharge may be obtaining a rehab bed if it is required  Consults called:  Consult Orthopedic surgery in am.  Discussed with Dr. Lynann Bologna Admission status:  Inpatient   Yevonne Aline Ido Wollman MD [facility]  How to contact the [facility] Attending or Consulting provider [place] or covering provider during after hours [place], for this patient?   1. Check the care team in [facility] and look for a) attending/consulting [facility] provider listed and b) the [facility] team listed 2. Log into [email or web address] and use [place]'s universal password to access. If you do not have the password, please contact the hospital operator. 3. Locate the [facility] provider you are looking for under [facility] and page to a number that you can be directly reached. 4. If you still have difficulty reaching the provider, please page the [facility] (Director on Call) for the Hospitalists listed on amion for assistance.  02/11/2020, 8:47 PM

## 2020-02-11 NOTE — ED Notes (Signed)
Called the floor to give report to [place], Therapist, sports. She said she will call me back.

## 2020-02-11 NOTE — ED Notes (Signed)
Vergia Alberts, RN again to give report. She said she is in a patients room and will call me back again.

## 2020-02-11 NOTE — ED Provider Notes (Signed)
[facility] Emergency Department Provider Note MRN:  [id number]  Arrival date & time: 02/11/20     Chief Complaint   Fall   History of Present Illness   [name] is a 86 y.o. year-old female with a history of CHF, COPD presenting to the ED with chief complaint of fall.  Fell last night and also today.  Feels like her knees buckle.  Feeling pain in her back and shoulders but this is chronic and only slightly worse from the fall.  Denies head trauma, no loss consciousness, no nausea or vomiting.  Endorsing some shortness of breath that is slightly worse today.  No chest pain, no abdominal pain, no fever, no dysuria.  Review of Systems  A complete 10 system review of systems was obtained and all systems are negative except as noted in the HPI and PMH.   Patient's Health History    Past Medical History:  Diagnosis Date  . AICD (automatic cardioverter/defibrillator) present 06/27/2007   medtronic  . ARF (acute renal failure) ([place]) 09/2017  . Cancer ([place])    ovarian--stage 4  . Cardiomyopathy ([place])   . CHF (congestive heart failure) ([place])   . Dyspnea   . Peripheral neuropathy   . Systemic hypertension   . Thyroid disease     Past Surgical History:  Procedure Laterality Date  . ABDOMINAL HYSTERECTOMY  oct 2000   along with ovarian ca surg  . APPENDECTOMY    . BIV PACEMAKER GENERATOR CHANGEOUT N/A 09/10/2019   Procedure: BIV PACEMAKER GENERATOR CHANGEOUT;  Surgeon: Sanda Klein, MD;  Location: [facility];  Service: Cardiovascular;  Laterality: N/A;  . CARDIAC CATHETERIZATION  09/27/1990   normal coronaries,dilated cardiomyopathy  . CARDIAC DEFIBRILLATOR PLACEMENT  06/27/2007   medtronic concerto  . CHOLECYSTECTOMY    . IMPLANTABLE CARDIOVERTER DEFIBRILLATOR GENERATOR CHANGE N/A 07/31/2013   Procedure: IMPLANTABLE CARDIOVERTER DEFIBRILLATOR GENERATOR CHANGE;  Surgeon: Sanda Klein, MD;  Location: [facility];  Service: Cardiovascular;   Laterality: N/A;  . NM MYOCAR PERF WALL MOTION  06/05/2007   no ischemia    Family History  Problem Relation Age of Onset  . Lung cancer Mother        died from cancer  . Heart attack Father   . Lung cancer Father   . Heart disease Paternal Grandmother     Social History   Socioeconomic History  . Marital status: Widowed    Spouse name: Not on file  . Number of children: 2  . Years of education: Not on file  . Highest education level: Not on file  Occupational History  . Occupation: housewife  Tobacco Use  . Smoking status: Never Smoker  . Smokeless tobacco: Never Used  Vaping Use  . Vaping Use: Never used  Substance and Sexual Activity  . Alcohol use: No  . Drug use: No  . Sexual activity: Not on file  Other Topics Concern  . Not on file  Social History Narrative  . Not on file   Social Determinants of Health   Financial Resource Strain:   . Difficulty of Paying Living Expenses: Not on file  Food Insecurity:   . Worried About Charity fundraiser in the Last Year: Not on file  . Ran Out of Food in the Last Year: Not on file  Transportation Needs:   . Lack of Transportation (Medical): Not on file  . Lack of Transportation (Non-Medical): Not on file  Physical Activity:   .  Days of Exercise per Week: Not on file  . Minutes of Exercise per Session: Not on file  Stress:   . Feeling of Stress : Not on file  Social Connections:   . Frequency of Communication with Friends and Family: Not on file  . Frequency of Social Gatherings with Friends and Family: Not on file  . Attends Religious Services: Not on file  . Active Member of Clubs or Organizations: Not on file  . Attends Archivist Meetings: Not on file  . Marital Status: Not on file  Intimate Partner Violence:   . Fear of Current or Ex-Partner: Not on file  . Emotionally Abused: Not on file  . Physically Abused: Not on file  . Sexually Abused: Not on file     Physical Exam   Vitals:   02/11/20  1800 02/11/20 1916  BP: (!) 182/80 (!) 146/67  Pulse: 84 86  Resp: (!) 22 19  Temp:    SpO2: 95% 95%    CONSTITUTIONAL: Chronically ill-appearing, NAD NEURO:  Alert and oriented x 3, no focal deficits EYES:  eyes equal and reactive ENT/NECK:  no LAD, no JVD CARDIO: Regular rate, well-perfused, normal S1 and S2 PULM:  CTAB no wheezing or rhonchi GI/GU:  normal bowel sounds, non-distended, non-tender MSK/SPINE:  No gross deformities, no edema SKIN:  no rash, atraumatic PSYCH:  Appropriate speech and behavior  *Additional and/or pertinent findings included in MDM below  Diagnostic and Interventional Summary    EKG Interpretation  Date/Time:  Monday February 11 2020 16:40:32 EST Ventricular Rate:  88 PR Interval:    QRS Duration: 132 QT Interval:  430 QTC Calculation: 521 R Axis:   83 Text Interpretation: Sinus rhythm IVCD, consider atypical LBBB Confirmed by Gerlene Fee [phone]) on 02/11/2020 4:50:44 PM      Labs Reviewed  BRAIN NATRIURETIC PEPTIDE - Abnormal; Notable for the following components:      Result Value   B Natriuretic Peptide 226.8 (*)    All other components within normal limits  CBC - Abnormal; Notable for the following components:   WBC 11.4 (*)    All other components within normal limits  COMPREHENSIVE METABOLIC PANEL - Abnormal; Notable for the following components:   Sodium 131 (*)    Chloride 93 (*)    Glucose, Bld 121 (*)    BUN 28 (*)    Albumin 3.4 (*)    All other components within normal limits  RESP PANEL BY RT PCR (RSV, FLU A&B, COVID)  PROTIME-INR  URINALYSIS, ROUTINE W REFLEX MICROSCOPIC  TROPONIN I (HIGH SENSITIVITY)  TROPONIN I (HIGH SENSITIVITY)    DG Tibia/Fibula Left  Final Result    DG Foot Complete Left  Final Result    DG Ankle Complete Left  Final Result    DG Chest Port 1 View  Final Result      Medications  oxyCODONE (Oxy IR/ROXICODONE) immediate release tablet 15 mg (15 mg Oral Given 02/11/20 1729)      Procedures  /  Critical Care Procedures  ED Course and Medical Decision Making  I have reviewed the triage vital signs, the nursing notes, and pertinent available records from the EMR.  Listed above are laboratory and imaging tests that I personally ordered, reviewed, and interpreted and then considered in my medical decision making (see below for details).  CHF versus COPD exacerbation as patient is requiring 2 L nasal cannula to maintain oxygen saturations above 90% at this time.  This would  be a new oxygen requirement.  Awaiting labs, chest x-ray.  Doubt PE.     X-ray with some evidence of edema, BNP slightly elevated, admitting to hospitalist service for management of mild CHF exacerbation.  Patient has broken her ankle due to the fall today, neurovascularly intact, placing an splint, will need orthopedic follow-up.  Barth Kirks. [place]onia Small, [place] [email or web address]  Final Clinical Impressions(s) / ED Diagnoses     ICD-10-CM   1. Acute on chronic congestive heart failure, unspecified heart failure type ([place])  I50.9   2. Closed trimalleolar fracture of left ankle, initial encounter  S82.852A     ED Discharge Orders    None       Discharge Instructions Discussed with and Provided to Patient:   Discharge Instructions   None       Maudie Flakes, MD 02/11/20 1956

## 2020-02-12 ENCOUNTER — Inpatient Hospital Stay (HOSPITAL_COMMUNITY): Payer: Medicare Other

## 2020-02-12 DIAGNOSIS — I5031 Acute diastolic (congestive) heart failure: Secondary | ICD-10-CM

## 2020-02-12 DIAGNOSIS — I5033 Acute on chronic diastolic (congestive) heart failure: Secondary | ICD-10-CM | POA: Diagnosis not present

## 2020-02-12 LAB — BASIC METABOLIC PANEL
Anion gap: 7 (ref 5–15)
BUN: 28 mg/dL — ABNORMAL HIGH (ref 8–23)
CO2: 32 mmol/L (ref 22–32)
Calcium: 8.5 mg/dL — ABNORMAL LOW (ref 8.9–10.3)
Chloride: 95 mmol/L — ABNORMAL LOW (ref 98–111)
Creatinine, Ser: 0.83 mg/dL (ref 0.44–1.00)
GFR, Estimated: >60 mL/min (ref >60)
Glucose, Bld: 122 mg/dL — ABNORMAL HIGH (ref 70–99)
Potassium: 4.8 mmol/L (ref 3.5–5.1)
Sodium: 134 mmol/L — ABNORMAL LOW (ref 135–145)

## 2020-02-12 LAB — ECHOCARDIOGRAM COMPLETE
AR max vel: 1.59 cm2
AV Area VTI: 1.75 cm2
AV Area mean vel: 1.72 cm2
AV Mean grad: 10 mmHg
AV Peak grad: 16.6 mmHg
Ao pk vel: 2.04 m/s
Area-P 1/2: 1.22 cm2
Height: 62 in
S' Lateral: 1.85 cm
Weight: 2257.51 oz

## 2020-02-12 LAB — RESP PANEL BY RT PCR (RSV, FLU A&B, COVID)
Influenza A by PCR: NEGATIVE
Influenza B by PCR: NEGATIVE
Respiratory Syncytial Virus by PCR: NEGATIVE
SARS Coronavirus 2 by RT PCR: NEGATIVE

## 2020-02-12 LAB — TROPONIN I (HIGH SENSITIVITY)
Troponin I (High Sensitivity): 11 ng/L (ref <18)
Troponin I (High Sensitivity): 14 ng/L (ref <18)

## 2020-02-12 MED ORDER — ENOXAPARIN SODIUM 40 MG/0.4ML ~~LOC~~ SOLN
40.0000 mg | SUBCUTANEOUS | Status: DC
  Administered 2020-02-12 – 2020-02-18 (×7): 40 mg via SUBCUTANEOUS
  Filled 2020-02-12 (×7): qty 0.4

## 2020-02-12 MED ORDER — SACUBITRIL-VALSARTAN 24-26 MG PO TABS
1.0000 | ORAL_TABLET | Freq: Two times a day (BID) | ORAL | Status: DC
  Filled 2020-02-12 (×2): qty 1

## 2020-02-12 MED ORDER — ASPIRIN EC 81 MG PO TBEC
81.0000 mg | DELAYED_RELEASE_TABLET | Freq: Every day | ORAL | Status: DC
  Administered 2020-02-12 – 2020-02-18 (×7): 81 mg via ORAL
  Filled 2020-02-12 (×7): qty 1

## 2020-02-12 MED ORDER — POTASSIUM CHLORIDE CRYS ER 10 MEQ PO TBCR
10.0000 meq | EXTENDED_RELEASE_TABLET | Freq: Two times a day (BID) | ORAL | Status: AC
  Administered 2020-02-12 – 2020-02-13 (×3): 10 meq via ORAL
  Filled 2020-02-12 (×4): qty 1

## 2020-02-12 MED ORDER — DULOXETINE HCL 60 MG PO CPEP
60.0000 mg | ORAL_CAPSULE | Freq: Every day | ORAL | Status: DC
  Administered 2020-02-12 – 2020-02-18 (×7): 60 mg via ORAL
  Filled 2020-02-12 (×7): qty 1

## 2020-02-12 MED ORDER — SODIUM CHLORIDE 0.9% FLUSH
3.0000 mL | Freq: Two times a day (BID) | INTRAVENOUS | Status: DC
  Administered 2020-02-12 – 2020-02-18 (×14): 3 mL via INTRAVENOUS

## 2020-02-12 MED ORDER — FUROSEMIDE 10 MG/ML IJ SOLN
60.0000 mg | Freq: Two times a day (BID) | INTRAMUSCULAR | Status: DC
  Administered 2020-02-12: 60 mg via INTRAVENOUS
  Filled 2020-02-12: qty 6

## 2020-02-12 MED ORDER — MOMETASONE FURO-FORMOTEROL FUM 100-5 MCG/ACT IN AERO
2.0000 | INHALATION_SPRAY | Freq: Two times a day (BID) | RESPIRATORY_TRACT | Status: DC
  Administered 2020-02-12 – 2020-02-18 (×13): 2 via RESPIRATORY_TRACT
  Filled 2020-02-12: qty 8.8

## 2020-02-12 MED ORDER — MELATONIN 5 MG PO TABS
10.0000 mg | ORAL_TABLET | Freq: Every day | ORAL | Status: DC
  Administered 2020-02-12 – 2020-02-17 (×6): 10 mg via ORAL
  Filled 2020-02-12 (×7): qty 2

## 2020-02-12 MED ORDER — AMLODIPINE BESYLATE 5 MG PO TABS
5.0000 mg | ORAL_TABLET | Freq: Every day | ORAL | Status: DC
  Administered 2020-02-12 – 2020-02-18 (×7): 5 mg via ORAL
  Filled 2020-02-12 (×7): qty 1

## 2020-02-12 MED ORDER — SODIUM CHLORIDE 0.9 % IV SOLN: 250.0000 mL | INTRAVENOUS | Status: DC | PRN

## 2020-02-12 MED ORDER — DORZOLAMIDE HCL-TIMOLOL MAL 2-0.5 % OP SOLN
1.0000 [drp] | Freq: Two times a day (BID) | OPHTHALMIC | Status: DC | PRN
  Administered 2020-02-12 – 2020-02-14 (×2): 1 [drp] via OPHTHALMIC
  Filled 2020-02-12: qty 10

## 2020-02-12 MED ORDER — DORZOLAMIDE HCL-TIMOLOL MAL 2-0.5 % OP SOLN
1.0000 [drp] | Freq: Two times a day (BID) | OPHTHALMIC | Status: DC
  Filled 2020-02-12: qty 10

## 2020-02-12 MED ORDER — LATANOPROST 0.005 % OP SOLN
1.0000 [drp] | Freq: Every day | OPHTHALMIC | Status: DC
  Administered 2020-02-12 – 2020-02-17 (×6): 1 [drp] via OPHTHALMIC
  Filled 2020-02-12: qty 2.5

## 2020-02-12 MED ORDER — DOCUSATE SODIUM 100 MG PO CAPS
100.0000 mg | ORAL_CAPSULE | Freq: Every day | ORAL | Status: DC
  Administered 2020-02-12 – 2020-02-18 (×7): 100 mg via ORAL
  Filled 2020-02-12 (×7): qty 1

## 2020-02-12 MED ORDER — ALBUTEROL SULFATE HFA 108 (90 BASE) MCG/ACT IN AERS: 1.0000 | INHALATION_SPRAY | Freq: Four times a day (QID) | RESPIRATORY_TRACT | Status: DC | PRN

## 2020-02-12 MED ORDER — ACETAMINOPHEN 325 MG PO TABS
650.0000 mg | ORAL_TABLET | ORAL | Status: DC | PRN
  Administered 2020-02-13 – 2020-02-17 (×4): 650 mg via ORAL
  Filled 2020-02-12 (×4): qty 2

## 2020-02-12 MED ORDER — CARVEDILOL 12.5 MG PO TABS
12.5000 mg | ORAL_TABLET | Freq: Two times a day (BID) | ORAL | Status: DC
  Administered 2020-02-12 – 2020-02-18 (×13): 12.5 mg via ORAL
  Filled 2020-02-12 (×13): qty 1

## 2020-02-12 MED ORDER — TRAZODONE HCL 50 MG PO TABS: 50.0000 mg | ORAL_TABLET | Freq: Every evening | ORAL | Status: DC | PRN

## 2020-02-12 MED ORDER — SODIUM CHLORIDE 0.9% FLUSH: 3.0000 mL | INTRAVENOUS | Status: DC | PRN

## 2020-02-12 MED ORDER — POLYETHYLENE GLYCOL 3350 17 G PO PACK
17.0000 g | PACK | Freq: Every day | ORAL | Status: DC
  Administered 2020-02-13 – 2020-02-18 (×6): 17 g via ORAL
  Filled 2020-02-12 (×6): qty 1

## 2020-02-12 MED ORDER — LEVOTHYROXINE SODIUM 112 MCG PO TABS
112.0000 ug | ORAL_TABLET | Freq: Every day | ORAL | Status: DC
  Administered 2020-02-12 – 2020-02-18 (×7): 112 ug via ORAL
  Filled 2020-02-12 (×7): qty 1

## 2020-02-12 NOTE — Progress Notes (Signed)
Echocardiogram 2D Echocardiogram has been performed.  [name] 02/12/2020, 12:00 PM

## 2020-02-12 NOTE — Progress Notes (Signed)
PT Cancellation Note  Patient Details Name: [name] MRN: [id number] DOB: [birth year]   Cancelled Treatment:    Reason Eval/Treat Not Completed: Medical issues which prohibited therapy Pt with L ankle fx and ortho consult.  Will await input from ortho. Abran Richard, PT Acute Rehab Services Pager [phone] [facility] [place] 02/12/2020, 9:52 AM

## 2020-02-12 NOTE — Progress Notes (Signed)
Received pt on a stretcher accompanied by ER NT @ 3468, pt awake & alert, on 2 L [place] sat 94%.

## 2020-02-12 NOTE — Consult Note (Signed)
Reason for Consult:Trimalleolar Closed Fx L ankle, unstable Referring Physician: Chotiner, MD  [name] is an 86 y.o. female.  HPI: Medical history significant for with a history of CHF, COPD, hypothyroidism, s/p AICD Shepresented to the ED yesterday evening with chief complaint of fall at home in bathroom and unable to bear weight on left leg.  She reports that she fell once on 14th and then again on 15th.  She reports that she went to stand up and she felt like her knees just buckled and she fell to the ground.  She did experience sharp pain in her left ankle and foot and was unable to stand up and bear weight after the second fall. She did hit her head on the floor when she fell but had no loss of consciousness.  Has noted that she has had mildly increased swelling of her baseline edema of her legs the last few days.  She denies any dysuria or urinary frequency.  Reports has been taking her medications as prescribed. She has a hx of severe neuropathy and numbness in BIL LE after cx tx. Has not been able to "feel my lower legs and feet for years" She also report hx of osteoporosis  She lives with her daughter and granddaughter.  Denies tobacco, alcohol, illicit drug use  Past Medical History:  Diagnosis Date  . AICD (automatic cardioverter/defibrillator) present 06/27/2007   medtronic  . ARF (acute renal failure) ([place]) 09/2017  . Cancer ([place])    ovarian--stage 4  . Cardiomyopathy ([place])   . CHF (congestive heart failure) ([place])   . COPD (chronic obstructive pulmonary disease) ([place])   . Dyspnea   . Hypothyroidism   . Peripheral neuropathy   . Systemic hypertension   . Thyroid disease     Past Surgical History:  Procedure Laterality Date  . ABDOMINAL HYSTERECTOMY  oct 2000   along with ovarian ca surg  . APPENDECTOMY    . BIV PACEMAKER GENERATOR CHANGEOUT N/A 09/10/2019   Procedure: BIV PACEMAKER GENERATOR CHANGEOUT;  Surgeon: Sanda Klein, MD;  Location: [facility];   Service: Cardiovascular;  Laterality: N/A;  . CARDIAC CATHETERIZATION  09/27/1990   normal coronaries,dilated cardiomyopathy  . CARDIAC DEFIBRILLATOR PLACEMENT  06/27/2007   medtronic concerto  . CHOLECYSTECTOMY    . IMPLANTABLE CARDIOVERTER DEFIBRILLATOR GENERATOR CHANGE N/A 07/31/2013   Procedure: IMPLANTABLE CARDIOVERTER DEFIBRILLATOR GENERATOR CHANGE;  Surgeon: Sanda Klein, MD;  Location: [facility];  Service: Cardiovascular;  Laterality: N/A;  . NM MYOCAR PERF WALL MOTION  06/05/2007   no ischemia    Family History  Problem Relation Age of Onset  . Lung cancer Mother        died from cancer  . Heart attack Father   . Lung cancer Father   . Heart disease Paternal Grandmother     Social History:  reports that she has never smoked. She has never used smokeless tobacco. She reports that she does not drink alcohol and does not use drugs.  Allergies: No Known Allergies  Medications:  Current Facility-Administered Medications:  .  0.9 %  sodium chloride infusion, 250 mL, Intravenous, PRN, Chotiner, Yevonne Aline, MD .  acetaminophen (TYLENOL) tablet 650 mg, 650 mg, Oral, Q4H PRN, Chotiner, Yevonne Aline, MD .  albuterol (VENTOLIN HFA) 108 (90 Base) MCG/ACT inhaler 1-2 puff, 1-2 puff, Inhalation, Q6H PRN, Chotiner, Yevonne Aline, MD .  amLODipine (NORVASC) tablet 5 mg, 5 mg, Oral, Daily, Chotiner, Yevonne Aline, MD, 5 mg at 02/12/20 0931 .  aspirin EC tablet 81 mg, 81 mg, Oral, Daily, Chotiner, Yevonne Aline, MD, 81 mg at 02/12/20 0931 .  carvedilol (COREG) tablet 12.5 mg, 12.5 mg, Oral, BID WC, Chotiner, Yevonne Aline, MD, 12.5 mg at 02/12/20 0753 .  dorzolamide-timolol (COSOPT) 22.3-6.8 MG/ML ophthalmic solution 1 drop, 1 drop, Both Eyes, BID PRN, Chotiner, Yevonne Aline, MD .  DULoxetine (CYMBALTA) DR capsule 60 mg, 60 mg, Oral, Daily, Chotiner, Yevonne Aline, MD, 60 mg at 02/12/20 0931 .  enoxaparin (LOVENOX) injection 40 mg, 40 mg, Subcutaneous, Q24H, Chotiner, Yevonne Aline, MD, 40 mg at 02/12/20 0756 .  latanoprost  (XALATAN) 0.005 % ophthalmic solution 1 drop, 1 drop, Both Eyes, QHS, Chotiner, Yevonne Aline, MD .  levothyroxine (SYNTHROID) tablet 112 mcg, 112 mcg, Oral, Q0600, Chotiner, Yevonne Aline, MD, 112 mcg at 02/12/20 0620 .  melatonin tablet 10 mg, 10 mg, Oral, QHS, Chotiner, Yevonne Aline, MD .  mometasone-formoterol [facility]) 100-5 MCG/ACT inhaler 2 puff, 2 puff, Inhalation, BID, Chotiner, Yevonne Aline, MD, 2 puff at 02/12/20 0756 .  oxyCODONE (Oxy IR/ROXICODONE) immediate release tablet 15 mg, 15 mg, Oral, Q6H PRN, Chotiner, Yevonne Aline, MD, 15 mg at 02/12/20 0754 .  potassium chloride (KLOR-CON) CR tablet 10 mEq, 10 mEq, Oral, BID, Chotiner, Yevonne Aline, MD, 10 mEq at 02/12/20 0931 .  sodium chloride flush (NS) 0.9 % injection 3 mL, 3 mL, Intravenous, Q12H, Chotiner, Yevonne Aline, MD, 3 mL at 02/12/20 0931 .  sodium chloride flush (NS) 0.9 % injection 3 mL, 3 mL, Intravenous, PRN, Chotiner, Yevonne Aline, MD .  traZODone (DESYREL) tablet 50 mg, 50 mg, Oral, QHS PRN, Chotiner, Yevonne Aline, MD  Results for orders placed or performed during the hospital encounter of 02/11/20 (from the past 48 hour(s))  Troponin I (High Sensitivity)     Status: None   Collection Time: 02/11/20  4:46 PM  Result Value Ref Range   Troponin I (High Sensitivity) 12 <18 ng/L    Comment: (NOTE) Elevated high sensitivity troponin I (hsTnI) values and significant  changes across serial measurements may suggest ACS but many other  chronic and acute conditions are known to elevate hsTnI results.  Refer to the "Links" section for chest pain algorithms and additional  guidance. Performed at [facility], [place] [address] [place], [place] [address]   CBC     Status: Abnormal   Collection Time: 02/11/20  4:46 PM  Result Value Ref Range   WBC 11.4 (H) 4.0 - 10.5 K/uL   RBC 4.64 3.87 - 5.11 MIL/uL   Hemoglobin 13.7 12.0 - 15.0 g/dL   HCT 45.2 36 - 46 %   MCV 97.4 80.0 - 100.0 fL   MCH 29.5 26.0 - 34.0 pg   MCHC 30.3 30.0 - 36.0  g/dL   RDW 13.4 11.5 - 15.5 %   Platelets 211 150 - 400 K/uL   nRBC 0.0 0.0 - 0.2 %    Comment: Performed at [facility], [place] [address], [place], [place] [address]  Comprehensive metabolic panel     Status: Abnormal   Collection Time: 02/11/20  4:46 PM  Result Value Ref Range   Sodium 131 (L) 135 - 145 mmol/L   Potassium 4.6 3.5 - 5.1 mmol/L   Chloride 93 (L) 98 - 111 mmol/L   CO2 28 22 - 32 mmol/L   Glucose, Bld 121 (H) 70 - 99 mg/dL    Comment: Glucose reference range applies only to samples taken after fasting for at least  8 hours.   BUN 28 (H) 8 - 23 mg/dL   Creatinine, Ser 0.86 0.44 - 1.00 mg/dL   Calcium 9.0 8.9 - 10.3 mg/dL   Total Protein 8.0 6.5 - 8.1 g/dL   Albumin 3.4 (L) 3.5 - 5.0 g/dL   AST 30 15 - 41 U/L   ALT 37 0 - 44 U/L   Alkaline Phosphatase 97 38 - 126 U/L   Total Bilirubin 0.5 0.3 - 1.2 mg/dL   GFR, Estimated >60 >60 mL/min    Comment: (NOTE) Calculated using the CKD-EPI Creatinine Equation (2021)    Anion gap 10 5 - 15    Comment: Performed at [facility], [place] [address], [place], [place] [address]  Protime-INR     Status: None   Collection Time: 02/11/20  4:46 PM  Result Value Ref Range   Prothrombin Time 13.3 11.4 - 15.2 seconds   INR 1.1 0.8 - 1.2    Comment: (NOTE) INR goal varies based on device and disease states. Performed at [facility], [place] [address], [place], [place] [address]   Brain natriuretic peptide     Status: Abnormal   Collection Time: 02/11/20  5:15 PM  Result Value Ref Range   B Natriuretic Peptide 226.8 (H) 0.0 - 100.0 pg/mL    Comment: Performed at [facility], [place] [address], [place], [place] [address]  Troponin I (High Sensitivity)     Status: None   Collection Time: 02/11/20  6:58 PM  Result Value Ref Range   Troponin I (High Sensitivity) 13 <18 ng/L    Comment: (NOTE) Elevated high sensitivity troponin I (hsTnI) values and significant   changes across serial measurements may suggest ACS but many other  chronic and acute conditions are known to elevate hsTnI results.  Refer to the "Links" section for chest pain algorithms and additional  guidance. Performed at [facility], [place] [address], [place], [place] [address]   Resp Panel by RT PCR (RSV, Flu A&B, Covid) - Nasopharyngeal Swab     Status: None   Collection Time: 02/11/20  7:47 PM   Specimen: Nasopharyngeal Swab  Result Value Ref Range   SARS Coronavirus 2 by RT PCR NEGATIVE NEGATIVE    Comment: (NOTE) SARS-CoV-2 target nucleic acids are NOT DETECTED.  The SARS-CoV-2 RNA is generally detectable in upper respiratoy specimens during the acute phase of infection. The lowest concentration of SARS-CoV-2 viral copies this assay can detect is 131 copies/mL. A negative result does not preclude SARS-Cov-2 infection and should not be used as the sole basis for treatment or other patient management decisions. A negative result may occur with  improper specimen collection/handling, submission of specimen other than nasopharyngeal swab, presence of viral mutation(s) within the areas targeted by this assay, and inadequate number of viral copies (<131 copies/mL). A negative result must be combined with clinical observations, patient history, and epidemiological information. The expected result is Negative.  Fact Sheet for Patients:  PinkCheek.be  Fact Sheet for Healthcare Providers:  GravelBags.it  This test is no t yet approved or cleared by the [place] FDA and  has been authorized for detection and/or diagnosis of SARS-CoV-2 by FDA under an Emergency Use Authorization (EUA). This EUA will remain  in effect (meaning this test can be used) for the duration of the COVID-19 declaration under Section 564(b)(1) of the Act, 21 U.S.C. section 360bbb-3(b)(1), unless the authorization is  terminated or revoked sooner.  Influenza A by PCR NEGATIVE NEGATIVE   Influenza B by PCR NEGATIVE NEGATIVE    Comment: (NOTE) The Xpert Xpress SARS-CoV-2/FLU/RSV assay is intended as an aid in  the diagnosis of influenza from Nasopharyngeal swab specimens and  should not be used as a sole basis for treatment. Nasal washings and  aspirates are unacceptable for Xpert Xpress SARS-CoV-2/FLU/RSV  testing.  Fact Sheet for Patients: PinkCheek.be  Fact Sheet for Healthcare Providers: GravelBags.it  This test is not yet approved or cleared by the [place] FDA and  has been authorized for detection and/or diagnosis of SARS-CoV-2 by  FDA under an Emergency Use Authorization (EUA). This EUA will remain  in effect (meaning this test can be used) for the duration of the  Covid-19 declaration under Section 564(b)(1) of the Act, 21  U.S.C. section 360bbb-3(b)(1), unless the authorization is  terminated or revoked.    Respiratory Syncytial Virus by PCR NEGATIVE NEGATIVE    Comment: (NOTE) Fact Sheet for Patients: PinkCheek.be  Fact Sheet for Healthcare Providers: GravelBags.it  This test is not yet approved or cleared by the [place] FDA and  has been authorized for detection and/or diagnosis of SARS-CoV-2 by  FDA under an Emergency Use Authorization (EUA). This EUA will remain  in effect (meaning this test can be used) for the duration of the  COVID-19 declaration under Section 564(b)(1) of the Act, 21 U.S.C.  section 360bbb-3(b)(1), unless the authorization is terminated or  revoked. Performed at [facility], [place] [address], [place], [place] [address]   Urinalysis, Routine w reflex microscopic     Status: Abnormal   Collection Time: 02/11/20  7:50 PM  Result Value Ref Range   Color, Urine AMBER (A) YELLOW    Comment: BIOCHEMICALS MAY BE  AFFECTED BY COLOR   APPearance HAZY (A) CLEAR   Specific Gravity, Urine 1.025 1.005 - 1.030   pH 5.0 5.0 - 8.0   Glucose, UA NEGATIVE NEGATIVE mg/dL   Hgb urine dipstick NEGATIVE NEGATIVE   Bilirubin Urine NEGATIVE NEGATIVE   Ketones, ur 5 (A) NEGATIVE mg/dL   Protein, ur 30 (A) NEGATIVE mg/dL   Nitrite NEGATIVE NEGATIVE   Leukocytes,Ua TRACE (A) NEGATIVE   RBC / HPF 0-5 0 - 5 RBC/hpf   WBC, UA 0-5 0 - 5 WBC/hpf   Bacteria, UA RARE (A) NONE SEEN   Squamous Epithelial / LPF 0-5 0 - 5    Comment: Performed at [facility], [place] [address], [place], [place] [address]  Basic metabolic panel     Status: Abnormal   Collection Time: 02/12/20  4:22 AM  Result Value Ref Range   Sodium 134 (L) 135 - 145 mmol/L   Potassium 4.8 3.5 - 5.1 mmol/L   Chloride 95 (L) 98 - 111 mmol/L   CO2 32 22 - 32 mmol/L   Glucose, Bld 122 (H) 70 - 99 mg/dL    Comment: Glucose reference range applies only to samples taken after fasting for at least 8 hours.   BUN 28 (H) 8 - 23 mg/dL   Creatinine, Ser 0.83 0.44 - 1.00 mg/dL   Calcium 8.5 (L) 8.9 - 10.3 mg/dL   GFR, Estimated >60 >60 mL/min    Comment: (NOTE) Calculated using the CKD-EPI Creatinine Equation (2021)    Anion gap 7 5 - 15    Comment: Performed at [facility], [place] [address] [place], [place] [address]  Troponin I (High Sensitivity)     Status:  None   Collection Time: 02/12/20  4:22 AM  Result Value Ref Range   Troponin I (High Sensitivity) 11 <18 ng/L    Comment: (NOTE) Elevated high sensitivity troponin I (hsTnI) values and significant  changes across serial measurements may suggest ACS but many other  chronic and acute conditions are known to elevate hsTnI results.  Refer to the "Links" section for chest pain algorithms and additional  guidance. Performed at [facility], [place] [address] [place], [place] [address]   Troponin I (High Sensitivity)     Status: None   Collection Time:  02/12/20  7:28 AM  Result Value Ref Range   Troponin I (High Sensitivity) 14 <18 ng/L    Comment: (NOTE) Elevated high sensitivity troponin I (hsTnI) values and significant  changes across serial measurements may suggest ACS but many other  chronic and acute conditions are known to elevate hsTnI results.  Refer to the "Links" section for chest pain algorithms and additional  guidance. Performed at [facility], [place] [address] [place], [place] [address]     DG Tibia/Fibula Left  Result Date: 02/11/2020 CLINICAL DATA:  Ankle fracture, fall, left lower leg, ankle and foot pain EXAM: LEFT TIBIA AND FIBULA - 2 VIEW LEFT FOOT - COMPLETE 3+ VIEW LEFT ANKLE COMPLETE - 3+ VIEW COMPARISON:  Left ankle radiograph 01/03/2012 FINDINGS: The osseous structures appear diffusely demineralized which may limit detection of small or nondisplaced fractures. Tiny crescentic radiodensity is seen along the posterior corner of the proximal tibia which is favored to be enthesopathic/degenerative in nature though could correlate for point tenderness. No additional proximal to mid tibia or fibular fractures are seen. Diffuse edematous changes of the lower leg are noted with associated skin thickening. Minimally comminuted, mildly displaced trimalleolar fracture of the ankle including an obliquely oriented trans syndesmotic distal fibular fracture (Weber B), a transversely oriented medial malleolar avulsive type fracture, and a slightly superiorly displaced posterior malleolar fracture. Diffuse circumferential soft tissue swelling of the ankle is noted. Suboptimal mortise projection at the level of the ankle may limit complete accurate assessment however the constellation of fracture findings are compatible with a stage IV injury which is unstable. Possible avulsive type fracture seen of the lateral process of the talus and along the proximal lateral aspect of the cuboid. No additional fractures are seen in  the foot. Alignment is grossly preserved though incompletely assessed on nonweightbearing view. Background of degenerative change throughout the foot. Bidirectional calcaneal spurs. Suspect enthesopathic changes near the proximal pole of the navicular though could correlate for point tenderness. IMPRESSION: 1. Diffuse bony demineralization may limit detection of subtle or nondisplaced fractures. 2. Crescentic radiodensity along the posterolateral corner of the tibial plateau. Possibly degenerative though could correlate for point tenderness. 3. Minimally comminuted, mildly displaced trimalleolar fracture of the ankle in a pattern compatible with a Weber B stage IV unstable ankle injury. 4. Suspect small avulsive type fractures seen of the lateral process of the talus and along the proximal lateral aspect of the cuboid. Additional fragmentation along the proximal pole navicular is favored to be enthesopathic though correlate for point tenderness to exclude fracture. Electronically Signed   By: Lovena Le M.D.   On: 02/11/2020 19:10   DG Ankle Complete Left  Result Date: 02/11/2020 CLINICAL DATA:  Ankle fracture, fall, left lower leg, ankle and foot pain EXAM: LEFT TIBIA AND FIBULA - 2 VIEW LEFT FOOT - COMPLETE 3+ VIEW LEFT ANKLE COMPLETE - 3+ VIEW COMPARISON:  Left ankle radiograph 01/03/2012 FINDINGS: The osseous structures appear diffusely demineralized which may limit detection of small or nondisplaced fractures. Tiny crescentic radiodensity is seen along the posterior corner of the proximal tibia which is favored to be enthesopathic/degenerative in nature though could correlate for point tenderness. No additional proximal to mid tibia or fibular fractures are seen. Diffuse edematous changes of the lower leg are noted with associated skin thickening. Minimally comminuted, mildly displaced trimalleolar fracture of the ankle including an obliquely oriented trans syndesmotic distal fibular fracture (Weber B),  a transversely oriented medial malleolar avulsive type fracture, and a slightly superiorly displaced posterior malleolar fracture. Diffuse circumferential soft tissue swelling of the ankle is noted. Suboptimal mortise projection at the level of the ankle may limit complete accurate assessment however the constellation of fracture findings are compatible with a stage IV injury which is unstable. Possible avulsive type fracture seen of the lateral process of the talus and along the proximal lateral aspect of the cuboid. No additional fractures are seen in the foot. Alignment is grossly preserved though incompletely assessed on nonweightbearing view. Background of degenerative change throughout the foot. Bidirectional calcaneal spurs. Suspect enthesopathic changes near the proximal pole of the navicular though could correlate for point tenderness. IMPRESSION: 1. Diffuse bony demineralization may limit detection of subtle or nondisplaced fractures. 2. Crescentic radiodensity along the posterolateral corner of the tibial plateau. Possibly degenerative though could correlate for point tenderness. 3. Minimally comminuted, mildly displaced trimalleolar fracture of the ankle in a pattern compatible with a Weber B stage IV unstable ankle injury. 4. Suspect small avulsive type fractures seen of the lateral process of the talus and along the proximal lateral aspect of the cuboid. Additional fragmentation along the proximal pole navicular is favored to be enthesopathic though correlate for point tenderness to exclude fracture. Electronically Signed   By: Lovena Le M.D.   On: 02/11/2020 19:10   DG Chest Port 1 View  Result Date: 02/11/2020 CLINICAL DATA:  Shortness of breath EXAM: PORTABLE CHEST 1 VIEW COMPARISON:  March 14, 2019 FINDINGS: The heart size and mediastinal contours mildly enlarged. Aortic knob calcifications are seen. A left-sided pacemaker seen with the lead tips at the right ventricle. Mildly increased  interstitial markings are seen throughout both lungs. No acute osseous abnormality. IMPRESSION: Mildly increased interstitial markings which could be due to atelectasis and/or interstitial edema. Electronically Signed   By: Prudencio Pair M.D.   On: 02/11/2020 17:09   DG Foot Complete Left  Result Date: 02/11/2020 CLINICAL DATA:  Ankle fracture, fall, left lower leg, ankle and foot pain EXAM: LEFT TIBIA AND FIBULA - 2 VIEW LEFT FOOT - COMPLETE 3+ VIEW LEFT ANKLE COMPLETE - 3+ VIEW COMPARISON:  Left ankle radiograph 01/03/2012 FINDINGS: The osseous structures appear diffusely demineralized which may limit detection of small or nondisplaced fractures. Tiny crescentic radiodensity is seen along the posterior corner of the proximal tibia which is favored to be enthesopathic/degenerative in nature though could correlate for point tenderness. No additional proximal to mid tibia or fibular fractures are seen. Diffuse edematous changes of the lower leg are noted with associated skin thickening. Minimally comminuted, mildly displaced trimalleolar fracture of the ankle including an obliquely oriented trans syndesmotic distal fibular fracture (Weber B), a transversely oriented medial malleolar avulsive type fracture, and a slightly superiorly displaced posterior malleolar fracture. Diffuse circumferential soft tissue swelling of the ankle is noted. Suboptimal mortise projection at the level of the ankle may limit complete accurate assessment however the constellation of  fracture findings are compatible with a stage IV injury which is unstable. Possible avulsive type fracture seen of the lateral process of the talus and along the proximal lateral aspect of the cuboid. No additional fractures are seen in the foot. Alignment is grossly preserved though incompletely assessed on nonweightbearing view. Background of degenerative change throughout the foot. Bidirectional calcaneal spurs. Suspect enthesopathic changes near the  proximal pole of the navicular though could correlate for point tenderness. IMPRESSION: 1. Diffuse bony demineralization may limit detection of subtle or nondisplaced fractures. 2. Crescentic radiodensity along the posterolateral corner of the tibial plateau. Possibly degenerative though could correlate for point tenderness. 3. Minimally comminuted, mildly displaced trimalleolar fracture of the ankle in a pattern compatible with a Weber B stage IV unstable ankle injury. 4. Suspect small avulsive type fractures seen of the lateral process of the talus and along the proximal lateral aspect of the cuboid. Additional fragmentation along the proximal pole navicular is favored to be enthesopathic though correlate for point tenderness to exclude fracture. Electronically Signed   By: Lovena Le M.D.   On: 02/11/2020 19:10    Review of Systems  Constitutional: Negative for diaphoresis and fever.  HENT: Negative for drooling, ear discharge, facial swelling and nosebleeds.   Eyes: Negative for discharge and redness.  Cardiovascular: Positive for leg swelling.  Gastrointestinal: Negative for diarrhea, nausea and vomiting.  Musculoskeletal: Positive for gait problem and joint swelling.  Skin: Negative for pallor, rash and wound.  Neurological: Positive for numbness. Negative for dizziness.  Psychiatric/Behavioral: Positive for confusion. Negative for agitation.  All other systems reviewed and are negative.  Blood pressure (!) 102/57, pulse 67, temperature 97.9 F (36.6 C), temperature source Oral, resp. rate 16, height 5\' 2"  (1.575 m), weight 64 kg, SpO2 95 %. Physical Exam Vitals reviewed.  Constitutional:      General: She is not in acute distress.    Appearance: Normal appearance. She is not diaphoretic.  HENT:     Head: Normocephalic and atraumatic.     Right Ear: External ear normal.     Left Ear: External ear normal.     Nose: Nose normal.     Mouth/Throat:     Pharynx: No oropharyngeal  exudate.  Eyes:     General:        Right eye: No discharge.        Left eye: No discharge.     Extraocular Movements: Extraocular movements intact.  Pulmonary:     Effort: Pulmonary effort is normal. No respiratory distress.     Breath sounds: No stridor.  Musculoskeletal:        General: Swelling, tenderness, deformity and signs of injury present.     Left lower leg: Edema present.     Comments: LLE is placed in splint and wrapped appropriately. Cap refill <2 sec. Upon removal of dressing no open wounds identified. Moderate swelling throughout ankle with some ecchymosis, no erythema. Skin is warm and dry. Sensation is absent, pt states this is her baseline and she has been unable to feel her foot in years, hx of neuropathy s/p cx tx. 2+ DPP symmetric, ROM/MMT deferred secondary to known unstable ankle fx. LL compartment soft and nontender.  Skin:    General: Skin is warm and dry.     Capillary Refill: Capillary refill takes less than 2 seconds.     Findings: Bruising present. No erythema.  Neurological:     Mental Status: She is alert. Mental status is at baseline.  Gait: Gait abnormal.  Psychiatric:        Mood and Affect: Mood normal.        Behavior: Behavior normal.   Likely some baseline dementia   Assessment/Plan:  Trimalleolar unstable ankle fx  - Maintain splint at all times  - NWB LLE  - Elevate aggressively as able  - Pain currently well controlled  - F/U at d/c with Dr Lucia Gaskins (Foot and ankle specialist [facility])   - Dr Lucia Gaskins aware of pt and care discussed. Will see in office upon D/C from hospital to discuss tx options. No surgery while inpt required.    - [facility]   [phone]  Osteoporosis Chronic Neuropathy: Pt has been unable to feell her feet for many years, S/P chemo tx Medical team following various issues, currently admitted for CHF  Justice Britain 02/12/2020, 12:46 PM

## 2020-02-12 NOTE — Evaluation (Signed)
Physical Therapy Evaluation Patient Details Name: [name] MRN: [id number] DOB: Aug 14, 1928 Today's Date: 02/12/2020   History of Present Illness  Pt is 86 yo female with hx including CHF, COPD, hypothyroidism, and AICD placement.  She presents s/p several falls at home.  She was found to have trimalleolar unstable ankle fx that is in splint and per ortho NWB with f/u in office for further tx options (no surgery at this time).  Clinical Impression   Pt admitted with above diagnosis. Pt presenting with decreased mobiltiy, strength, endurance, safety, balance, and ROM, with increased pain.  She was very fearful of falling or putting weight on her NWB leg so would not attempt stand - will need assist of 2.  Pt required frequent cues for sequencing and transfer technique.  She was mod A to EOB but unable to further progress due to fear of falling. Additionally, see note below about R heel. Pt has limited support at home and will need SNF at d/c. Pt currently with functional limitations due to the deficits listed below (see PT Problem List). Pt will benefit from skilled PT to increase their independence and safety with mobility to allow discharge to the venue listed below.    Upon sitting, pt reports pain in R heel.  States "I don't know if I told the MD about that."  Posterior R heel was very painful to palpate and with ecchymosis.  Additionally, pt had pain with dorsiflexion (plantar flexion light stretch) in same area.  Notified MD.      Follow Up Recommendations SNF    Equipment Recommendations  Wheelchair cushion (measurements PT);Wheelchair (measurements PT);3in1 (PT) (drop arm on w/c and 3 in 1)    Recommendations for Other Services       Precautions / Restrictions Precautions Precautions: Fall Required Braces or Orthoses: Splint/Cast Splint/Cast: Splint on L LE Restrictions Weight Bearing Restrictions: Yes LLE Weight Bearing: Non weight bearing      Mobility  Bed  Mobility Overal bed mobility: Needs Assistance Bed Mobility: Supine to Sit;Sit to Supine     Supine to sit: Mod assist Sit to supine: Mod assist   General bed mobility comments: Increased time, cues for sequence, technique, and relaxation    Transfers                 General transfer comment: Attempted sit to stands but pt too fearful.  Attempted for at least 5 mins with cues for hand and foot placement, bed elevated, and encouragement that if she is unable then she just sits back down.  Pt too fearful with assist of 1 and did not have assist avaiable.  Ambulation/Gait                Stairs            Wheelchair Mobility    Modified Rankin (Stroke Patients Only)       Balance Overall balance assessment: Needs assistance Sitting-balance support: No upper extremity supported Sitting balance-Leahy Scale: Fair         Standing balance comment: unable                             Pertinent Vitals/Pain Pain Assessment: 0-10 Pain Score: 6  Pain Location: L ankle and R posterior heel Pain Descriptors / Indicators: Sharp;Sore Pain Intervention(s): Limited activity within patient's tolerance;Monitored during session;Repositioned;Relaxation;Other (comment) (elevation)    Home Living Family/patient expects to be discharged to:: Private  residence Living Arrangements: Children;Other (Comment) (daughter and granddaughter) Available Help at Discharge: Family;Available PRN/intermittently Type of Home: House Home Access: Stairs to enter Entrance Stairs-Rails: Right;Left;Can reach both Entrance Stairs-Number of Steps: 2 Home Layout: Two level;Able to live on main level with bedroom/bathroom Home Equipment: Walker - 4 wheels;Shower seat;Grab bars - tub/shower;Grab bars - toilet;Cane - single point Additional Comments: Pt reports lives with daughter and granddaughter (but granddaughter unable to assist).  Daughter works.    Prior Function Level of  Independence: Independent with assistive device(s);Needs assistance   Gait / Transfers Assistance Needed: Reports could ambulate in home and short community distances with rollator or RW; several recent falls  ADL's / Homemaking Assistance Needed: Reports able to do ADLs but with "great difficulty"; family assisted with IADLs        Hand Dominance        Extremity/Trunk Assessment   Upper Extremity Assessment Upper Extremity Assessment: Generalized weakness    Lower Extremity Assessment Lower Extremity Assessment: LLE deficits/detail;RLE deficits/detail RLE Deficits / Details: ROM WFL but posterior heel painful with dorsiflexion limiting motion; MMT: hip 3/5, knee 3/5, ankle DF 1/5, ankle PF 2/5 LLE Deficits / Details: ROM knee and hip WFL , ankle in splint; MMT hip 3/5, knee 2/5, ankle in splint, unable to flex/ext toes    Cervical / Trunk Assessment Cervical / Trunk Assessment: Kyphotic  Communication   Communication: HOH  Cognition Arousal/Alertness: Awake/alert Behavior During Therapy: Anxious Overall Cognitive Status: No family/caregiver present to determine baseline cognitive functioning Area of Impairment: Orientation;Attention;Following commands;Safety/judgement;Problem solving                 Orientation Level: Disoriented to;Time Current Attention Level: Sustained   Following Commands: Follows one step commands inconsistently     Problem Solving: Slow processing;Requires tactile cues;Requires verbal cues General Comments: Pt with significant anxiousness in regards to movement - required repeated and multimodal cues      General Comments      Exercises     Assessment/Plan    PT Assessment Patient needs continued PT services  PT Problem List Decreased strength;Decreased mobility;Decreased safety awareness;Decreased range of motion;Decreased coordination;Decreased knowledge of precautions;Decreased activity tolerance;Decreased balance;Decreased  knowledge of use of DME;Pain       PT Treatment Interventions DME instruction;Therapeutic activities;Therapeutic exercise;Patient/family education;Modalities;Balance training;Functional mobility training;Wheelchair mobility training    PT Goals (Current goals can be found in the Care Plan section)  Acute Rehab PT Goals Patient Stated Goal: be able to walk PT Goal Formulation: With patient Time For Goal Achievement: 02/26/20 Potential to Achieve Goals: Fair    Frequency Min 3X/week   Barriers to discharge Decreased caregiver support;Inaccessible home environment      Co-evaluation               AM-PAC PT "6 Clicks" Mobility  Outcome Measure Help needed turning from your back to your side while in a flat bed without using bedrails?: A Lot Help needed moving from lying on your back to sitting on the side of a flat bed without using bedrails?: A Lot Help needed moving to and from a bed to a chair (including a wheelchair)?: Total Help needed standing up from a chair using your arms (e.g., wheelchair or bedside chair)?: Total Help needed to walk in hospital room?: Total Help needed climbing 3-5 steps with a railing? : Total 6 Click Score: 8    End of Session Equipment Utilized During Treatment: Gait belt Activity Tolerance: Patient limited by pain;Other (comment) (limited by  pain, fear of falling, only had assist of 1) Patient left: in bed;with bed alarm set;with call bell/phone within reach Nurse Communication: Mobility status PT Visit Diagnosis: Unsteadiness on feet (R26.81);Repeated falls (R29.6);Muscle weakness (generalized) (M62.81);Pain Pain - Right/Left:  (L ankle, R heel)    Time: 7591-6384 PT Time Calculation (min) (ACUTE ONLY): 35 min   Charges:   PT Evaluation $PT Eval Moderate Complexity: 1 Mod PT Treatments $Therapeutic Activity: 8-22 mins        Abran Richard, PT Acute Rehab Services Pager [phone] [facility] [phone]    Karlton Lemon 02/12/2020, 3:58 PM

## 2020-02-12 NOTE — Progress Notes (Signed)
PROGRESS NOTE    [name]  UXL:[id number] DOB: Mar 15, 1929 DOA: 02/11/2020 PCP: Aurea Graff, PA-C (Inactive)   Chief Complain: Shortness of breath, fall  Brief Narrative: Patient is an 86 year old female with history of congestive heart failure, COPD, hypothyroidism, status post AICD who presented to the emergency department with complaint of fall at home.  Also reported increasing shortness of breath that worsened with exertion.  On presentation she was found to have elevated BNP, chest x-ray showed mild interstitial edema.  She was requiring 2 L of oxygen for maintenance of her saturation.  X-ray of the left ankle showed fracture.  Orthopedics consulted.  Assessment & Plan:   Principal Problem:   Acute on chronic heart failure with preserved ejection fraction (HFpEF) (HCC) Active Problems:   COPD (chronic obstructive pulmonary disease) (HCC)   ICD (implantable cardioverter-defibrillator) battery depletion   Essential hypertension   Fracture of left ankle, closed, initial encounter   Fall at home, initial encounter   Prolonged QT interval   Fall/left ankle fracture: Splint placed in the emergency department.  Orthopedics consulted.  Continue pain medications, supportive care. Xray showed Minimally comminuted, mildly displaced trimalleolar fracture of the ankle.  PT/OT consulted She might need SNF on DC  Acute hypoxic respiratory failure: Requiring 2 L of oxygen per minute.  Not on oxygen at home.  Hypoxia secondary to congestive heart failure.  Patient was on oxygen at home when needed but not now  Dyspnea/acute on chronic diastolic congestive heart failure: Elevated BNP on presentation.  She is complaining of dyspnea on exertion.  Echocardiogram pending.  On beta-blockers.    Continue to monitor daily weight, input/output. Echocardiogram on 09/2017 showed ejection fraction of 55 to 53% grade 1 diastolic dysfunction. On Lasix 40 mg daily at home. She was started on  high-dose Lasix on admission.  Will stop Lasix because still appears little dehydrated today.  She did not have any lower extremity edema.  Will resume Lasix 40 mg daily from tomorrow. She has H/O nonischemic cardiomyopathy and is status post ICD placement several years ago.  COPD: Currently not in exacerbation.  Continue as needed bronchodilators, home inhalers  Hypertension: On Norvasc and Coreg at home which have been  resumed.  Continue to monitor blood pressure.  Hypothyroidism: Continue Synthyroid  Hyperlipidemia: Continue statin  Dementia: Confused at baseline.  Continue supportive care.  Delirium precautions             DVT prophylaxis:Lovenox Code Status: Full Family Communication: Called and discussed with daughter on phone 01/12/20 Status is: Inpatient  Remains inpatient appropriate because:Inpatient level of care appropriate due to severity of illness   Dispo: The patient is from: Home              Anticipated d/c is to: SNF              Anticipated d/c date is: 3 days              Patient currently is not medically stable to d/c.     Consultants: orthopedics  Procedures:None  Antimicrobials:  Anti-infectives (From admission, onward)   None      Subjective: Patient seen and examined at the bedside this morning.  Hemodynamically stable during my evaluation.  Sleeping.  Woke up and communicated.  Was not in respiratory distress.  No lower extremity edema and she was complaining of being thirsty.  Objective: Vitals:   02/11/20 2334 02/12/20 0248 02/12/20 0500 02/12/20 0644  BP: (!) 158/70 Marland Kitchen)  154/77  (!) 165/86  Pulse: 86 89  85  Resp: 18 20  16   Temp: 98.6 F (37 C) 97.7 F (36.5 C)  97.8 F (36.6 C)  TempSrc: Oral Oral  Oral  SpO2: 94% 94%  93%  Weight: 63.6 kg  64 kg   Height: 5\' 2"  (1.575 m)       Intake/Output Summary (Last 24 hours) at 02/12/2020 0817 Last data filed at 02/12/2020 0304 Gross per 24 hour  Intake 3 ml  Output --   Net 3 ml   Filed Weights   02/11/20 1638 02/11/20 2334 02/12/20 0500  Weight: 63.5 kg 63.6 kg 64 kg    Examination:  General exam: Very deconditioned, debilitated elderly female HEENT:PERRL,Oral mucosa moist, Ear/Nose normal on gross exam Respiratory system: Bilateral basal crackles  cardiovascular system: S1 & S2 heard, RRR. No JVD, murmurs, rubs, gallops or clicks. No pedal edema. Gastrointestinal system: Abdomen is nondistended, soft and nontender. No organomegaly or masses felt. Normal bowel sounds heard. Central nervous system: Alert and awake but not oriented. No focal neurological deficits. Extremities: No edema, no clubbing ,no cyanosis, splint on the left lower extremity  skin: No rashes, lesions or ulcers,no icterus ,no pallor  Data Reviewed: I have personally reviewed following labs and imaging studies  CBC: Recent Labs  Lab 02/11/20 1646  WBC 11.4*  HGB 13.7  HCT 45.2  MCV 97.4  PLT 761   Basic Metabolic Panel: Recent Labs  Lab 02/11/20 1646 02/12/20 0422  NA 131* 134*  K 4.6 4.8  CL 93* 95*  CO2 28 32  GLUCOSE 121* 122*  BUN 28* 28*  CREATININE 0.86 0.83  CALCIUM 9.0 8.5*   GFR: Estimated Creatinine Clearance: 40.4 mL/min (by C-G formula based on SCr of 0.83 mg/dL). Liver Function Tests: Recent Labs  Lab 02/11/20 1646  AST 30  ALT 37  ALKPHOS 97  BILITOT 0.5  PROT 8.0  ALBUMIN 3.4*   No results for input(s): LIPASE, AMYLASE in the last 168 hours. No results for input(s): AMMONIA in the last 168 hours. Coagulation Profile: Recent Labs  Lab 02/11/20 1646  INR 1.1   Cardiac Enzymes: No results for input(s): CKTOTAL, CKMB, CKMBINDEX, TROPONINI in the last 168 hours. BNP (last 3 results) No results for input(s): PROBNP in the last 8760 hours. HbA1C: No results for input(s): HGBA1C in the last 72 hours. CBG: No results for input(s): GLUCAP in the last 168 hours. Lipid Profile: No results for input(s): CHOL, HDL, LDLCALC, TRIG, CHOLHDL,  LDLDIRECT in the last 72 hours. Thyroid Function Tests: No results for input(s): TSH, T4TOTAL, FREET4, T3FREE, THYROIDAB in the last 72 hours. Anemia Panel: No results for input(s): VITAMINB12, FOLATE, FERRITIN, TIBC, IRON, RETICCTPCT in the last 72 hours. Sepsis Labs: No results for input(s): PROCALCITON, LATICACIDVEN in the last 168 hours.  Recent Results (from the past 240 hour(s))  Resp Panel by RT PCR (RSV, Flu A&B, Covid) - Nasopharyngeal Swab     Status: None   Collection Time: 02/11/20  7:47 PM   Specimen: Nasopharyngeal Swab  Result Value Ref Range Status   SARS Coronavirus 2 by RT PCR NEGATIVE NEGATIVE Final    Comment: (NOTE) SARS-CoV-2 target nucleic acids are NOT DETECTED.  The SARS-CoV-2 RNA is generally detectable in upper respiratoy specimens during the acute phase of infection. The lowest concentration of SARS-CoV-2 viral copies this assay can detect is 131 copies/mL. A negative result does not preclude SARS-Cov-2 infection and should not be used as the  sole basis for treatment or other patient management decisions. A negative result may occur with  improper specimen collection/handling, submission of specimen other than nasopharyngeal swab, presence of viral mutation(s) within the areas targeted by this assay, and inadequate number of viral copies (<131 copies/mL). A negative result must be combined with clinical observations, patient history, and epidemiological information. The expected result is Negative.  Fact Sheet for Patients:  PinkCheek.be  Fact Sheet for Healthcare Providers:  GravelBags.it  This test is no t yet approved or cleared by the [place] FDA and  has been authorized for detection and/or diagnosis of SARS-CoV-2 by FDA under an Emergency Use Authorization (EUA). This EUA will remain  in effect (meaning this test can be used) for the duration of the COVID-19 declaration under  Section 564(b)(1) of the Act, 21 U.S.C. section 360bbb-3(b)(1), unless the authorization is terminated or revoked sooner.     Influenza A by PCR NEGATIVE NEGATIVE Final   Influenza B by PCR NEGATIVE NEGATIVE Final    Comment: (NOTE) The Xpert Xpress SARS-CoV-2/FLU/RSV assay is intended as an aid in  the diagnosis of influenza from Nasopharyngeal swab specimens and  should not be used as a sole basis for treatment. Nasal washings and  aspirates are unacceptable for Xpert Xpress SARS-CoV-2/FLU/RSV  testing.  Fact Sheet for Patients: PinkCheek.be  Fact Sheet for Healthcare Providers: GravelBags.it  This test is not yet approved or cleared by the [place] FDA and  has been authorized for detection and/or diagnosis of SARS-CoV-2 by  FDA under an Emergency Use Authorization (EUA). This EUA will remain  in effect (meaning this test can be used) for the duration of the  Covid-19 declaration under Section 564(b)(1) of the Act, 21  U.S.C. section 360bbb-3(b)(1), unless the authorization is  terminated or revoked.    Respiratory Syncytial Virus by PCR NEGATIVE NEGATIVE Final    Comment: (NOTE) Fact Sheet for Patients: PinkCheek.be  Fact Sheet for Healthcare Providers: GravelBags.it  This test is not yet approved or cleared by the [place] FDA and  has been authorized for detection and/or diagnosis of SARS-CoV-2 by  FDA under an Emergency Use Authorization (EUA). This EUA will remain  in effect (meaning this test can be used) for the duration of the  COVID-19 declaration under Section 564(b)(1) of the Act, 21 U.S.C.  section 360bbb-3(b)(1), unless the authorization is terminated or  revoked. Performed at [facility], [place] [address], [place], [place] [address]          Radiology Studies: DG Tibia/Fibula Left  Result Date:  02/11/2020 CLINICAL DATA:  Ankle fracture, fall, left lower leg, ankle and foot pain EXAM: LEFT TIBIA AND FIBULA - 2 VIEW LEFT FOOT - COMPLETE 3+ VIEW LEFT ANKLE COMPLETE - 3+ VIEW COMPARISON:  Left ankle radiograph 01/03/2012 FINDINGS: The osseous structures appear diffusely demineralized which may limit detection of small or nondisplaced fractures. Tiny crescentic radiodensity is seen along the posterior corner of the proximal tibia which is favored to be enthesopathic/degenerative in nature though could correlate for point tenderness. No additional proximal to mid tibia or fibular fractures are seen. Diffuse edematous changes of the lower leg are noted with associated skin thickening. Minimally comminuted, mildly displaced trimalleolar fracture of the ankle including an obliquely oriented trans syndesmotic distal fibular fracture (Weber B), a transversely oriented medial malleolar avulsive type fracture, and a slightly superiorly displaced posterior malleolar fracture. Diffuse circumferential soft tissue swelling of the ankle is noted. Suboptimal mortise projection at the  level of the ankle may limit complete accurate assessment however the constellation of fracture findings are compatible with a stage IV injury which is unstable. Possible avulsive type fracture seen of the lateral process of the talus and along the proximal lateral aspect of the cuboid. No additional fractures are seen in the foot. Alignment is grossly preserved though incompletely assessed on nonweightbearing view. Background of degenerative change throughout the foot. Bidirectional calcaneal spurs. Suspect enthesopathic changes near the proximal pole of the navicular though could correlate for point tenderness. IMPRESSION: 1. Diffuse bony demineralization may limit detection of subtle or nondisplaced fractures. 2. Crescentic radiodensity along the posterolateral corner of the tibial plateau. Possibly degenerative though could correlate for  point tenderness. 3. Minimally comminuted, mildly displaced trimalleolar fracture of the ankle in a pattern compatible with a Weber B stage IV unstable ankle injury. 4. Suspect small avulsive type fractures seen of the lateral process of the talus and along the proximal lateral aspect of the cuboid. Additional fragmentation along the proximal pole navicular is favored to be enthesopathic though correlate for point tenderness to exclude fracture. Electronically Signed   By: Lovena Le M.D.   On: 02/11/2020 19:10   DG Ankle Complete Left  Result Date: 02/11/2020 CLINICAL DATA:  Ankle fracture, fall, left lower leg, ankle and foot pain EXAM: LEFT TIBIA AND FIBULA - 2 VIEW LEFT FOOT - COMPLETE 3+ VIEW LEFT ANKLE COMPLETE - 3+ VIEW COMPARISON:  Left ankle radiograph 01/03/2012 FINDINGS: The osseous structures appear diffusely demineralized which may limit detection of small or nondisplaced fractures. Tiny crescentic radiodensity is seen along the posterior corner of the proximal tibia which is favored to be enthesopathic/degenerative in nature though could correlate for point tenderness. No additional proximal to mid tibia or fibular fractures are seen. Diffuse edematous changes of the lower leg are noted with associated skin thickening. Minimally comminuted, mildly displaced trimalleolar fracture of the ankle including an obliquely oriented trans syndesmotic distal fibular fracture (Weber B), a transversely oriented medial malleolar avulsive type fracture, and a slightly superiorly displaced posterior malleolar fracture. Diffuse circumferential soft tissue swelling of the ankle is noted. Suboptimal mortise projection at the level of the ankle may limit complete accurate assessment however the constellation of fracture findings are compatible with a stage IV injury which is unstable. Possible avulsive type fracture seen of the lateral process of the talus and along the proximal lateral aspect of the cuboid. No  additional fractures are seen in the foot. Alignment is grossly preserved though incompletely assessed on nonweightbearing view. Background of degenerative change throughout the foot. Bidirectional calcaneal spurs. Suspect enthesopathic changes near the proximal pole of the navicular though could correlate for point tenderness. IMPRESSION: 1. Diffuse bony demineralization may limit detection of subtle or nondisplaced fractures. 2. Crescentic radiodensity along the posterolateral corner of the tibial plateau. Possibly degenerative though could correlate for point tenderness. 3. Minimally comminuted, mildly displaced trimalleolar fracture of the ankle in a pattern compatible with a Weber B stage IV unstable ankle injury. 4. Suspect small avulsive type fractures seen of the lateral process of the talus and along the proximal lateral aspect of the cuboid. Additional fragmentation along the proximal pole navicular is favored to be enthesopathic though correlate for point tenderness to exclude fracture. Electronically Signed   By: Lovena Le M.D.   On: 02/11/2020 19:10   DG Chest Port 1 View  Result Date: 02/11/2020 CLINICAL DATA:  Shortness of breath EXAM: PORTABLE CHEST 1 VIEW COMPARISON:  March 14, 2019  FINDINGS: The heart size and mediastinal contours mildly enlarged. Aortic knob calcifications are seen. A left-sided pacemaker seen with the lead tips at the right ventricle. Mildly increased interstitial markings are seen throughout both lungs. No acute osseous abnormality. IMPRESSION: Mildly increased interstitial markings which could be due to atelectasis and/or interstitial edema. Electronically Signed   By: Prudencio Pair M.D.   On: 02/11/2020 17:09   DG Foot Complete Left  Result Date: 02/11/2020 CLINICAL DATA:  Ankle fracture, fall, left lower leg, ankle and foot pain EXAM: LEFT TIBIA AND FIBULA - 2 VIEW LEFT FOOT - COMPLETE 3+ VIEW LEFT ANKLE COMPLETE - 3+ VIEW COMPARISON:  Left ankle radiograph  01/03/2012 FINDINGS: The osseous structures appear diffusely demineralized which may limit detection of small or nondisplaced fractures. Tiny crescentic radiodensity is seen along the posterior corner of the proximal tibia which is favored to be enthesopathic/degenerative in nature though could correlate for point tenderness. No additional proximal to mid tibia or fibular fractures are seen. Diffuse edematous changes of the lower leg are noted with associated skin thickening. Minimally comminuted, mildly displaced trimalleolar fracture of the ankle including an obliquely oriented trans syndesmotic distal fibular fracture (Weber B), a transversely oriented medial malleolar avulsive type fracture, and a slightly superiorly displaced posterior malleolar fracture. Diffuse circumferential soft tissue swelling of the ankle is noted. Suboptimal mortise projection at the level of the ankle may limit complete accurate assessment however the constellation of fracture findings are compatible with a stage IV injury which is unstable. Possible avulsive type fracture seen of the lateral process of the talus and along the proximal lateral aspect of the cuboid. No additional fractures are seen in the foot. Alignment is grossly preserved though incompletely assessed on nonweightbearing view. Background of degenerative change throughout the foot. Bidirectional calcaneal spurs. Suspect enthesopathic changes near the proximal pole of the navicular though could correlate for point tenderness. IMPRESSION: 1. Diffuse bony demineralization may limit detection of subtle or nondisplaced fractures. 2. Crescentic radiodensity along the posterolateral corner of the tibial plateau. Possibly degenerative though could correlate for point tenderness. 3. Minimally comminuted, mildly displaced trimalleolar fracture of the ankle in a pattern compatible with a Weber B stage IV unstable ankle injury. 4. Suspect small avulsive type fractures seen of the  lateral process of the talus and along the proximal lateral aspect of the cuboid. Additional fragmentation along the proximal pole navicular is favored to be enthesopathic though correlate for point tenderness to exclude fracture. Electronically Signed   By: Lovena Le M.D.   On: 02/11/2020 19:10        Scheduled Meds: . amLODipine  5 mg Oral Daily  . aspirin EC  81 mg Oral Daily  . carvedilol  12.5 mg Oral BID WC  . DULoxetine  60 mg Oral Daily  . enoxaparin (LOVENOX) injection  40 mg Subcutaneous Q24H  . furosemide  60 mg Intravenous BID  . latanoprost  1 drop Both Eyes QHS  . levothyroxine  112 mcg Oral Q0600  . melatonin  10 mg Oral QHS  . mometasone-formoterol  2 puff Inhalation BID  . potassium chloride  10 mEq Oral BID  . sacubitril-valsartan  1 tablet Oral BID  . sodium chloride flush  3 mL Intravenous Q12H   Continuous Infusions: . sodium chloride       LOS: 1 day    Time spent: More than 50% of that time was spent in counseling and/or coordination of care.      Shelly Coss,  MD [facility] P11/16/2021, 8:17 AM

## 2020-02-13 ENCOUNTER — Inpatient Hospital Stay (HOSPITAL_COMMUNITY): Payer: Medicare Other

## 2020-02-13 DIAGNOSIS — I5033 Acute on chronic diastolic (congestive) heart failure: Secondary | ICD-10-CM | POA: Diagnosis not present

## 2020-02-13 LAB — BASIC METABOLIC PANEL
Anion gap: 7 (ref 5–15)
BUN: 25 mg/dL — ABNORMAL HIGH (ref 8–23)
CO2: 32 mmol/L (ref 22–32)
Calcium: 8.6 mg/dL — ABNORMAL LOW (ref 8.9–10.3)
Chloride: 96 mmol/L — ABNORMAL LOW (ref 98–111)
Creatinine, Ser: 0.71 mg/dL (ref 0.44–1.00)
GFR, Estimated: >60 mL/min (ref >60)
Glucose, Bld: 129 mg/dL — ABNORMAL HIGH (ref 70–99)
Potassium: 5 mmol/L (ref 3.5–5.1)
Sodium: 135 mmol/L (ref 135–145)

## 2020-02-13 MED ORDER — FUROSEMIDE 40 MG PO TABS: 40.0000 mg | ORAL_TABLET | Freq: Every day | ORAL | Status: DC

## 2020-02-13 MED ORDER — FUROSEMIDE 10 MG/ML IJ SOLN
40.0000 mg | Freq: Once | INTRAMUSCULAR | Status: AC
  Administered 2020-02-13: 40 mg via INTRAVENOUS
  Filled 2020-02-13: qty 4

## 2020-02-13 NOTE — Progress Notes (Signed)
PROGRESS NOTE    [name]  PXT:[id number] DOB: Jun 11, 1928 DOA: 02/11/2020 PCP: Carolee Rota, NP   Chief Complain: Shortness of breath, fall  Brief Narrative: Patient is an 86 year old female with history of congestive heart failure, COPD, hypothyroidism, status post AICD who presented to the emergency department with complaint of fall at home.  Also reported increasing shortness of breath that worsened with exertion.  On presentation she was found to have elevated BNP, chest x-ray showed mild interstitial edema.  She was requiring 2 L of oxygen for maintenance of her saturation.  X-rays showed  left ankle showed fracture and avulsion type fracture involving the posterosuperior aspect of the calcaneus .  Orthopedics consulted who recommended nonoperative management and outpatient follow-up.  PT/OT recommended skilled nursing  facility on discharge.  Assessment & Plan:   Principal Problem:   Acute on chronic heart failure with preserved ejection fraction (HFpEF) (HCC) Active Problems:   COPD (chronic obstructive pulmonary disease) (HCC)   ICD (implantable cardioverter-defibrillator) battery depletion   Essential hypertension   Fracture of left ankle, closed, initial encounter   Fall at home, initial encounter   Prolonged QT interval   Fall/left ankle fracture/right calcaneal fracture: Splint placed in the emergency department on the left .Xray showed Minimally comminuted, mildly displaced trimalleolar fracture of the ankle and also avulsion type fracture involving the posterosuperior aspect of the calcaneus. Orthopedics consulted who recommended nonoperative management and outpatient follow-up.  PT/OT recommended skilled nursing  facility on discharge.  Acute hypoxic respiratory failure: Requiring 2 L of oxygen per minute.  Not on oxygen at home.  Hypoxia secondary to congestive heart failure.  Patient was on oxygen at home in the past  but not now.  Dyspnea/acute on chronic  diastolic congestive heart failure: Elevated BNP on presentation.  She is complaining of dyspnea on exertion.  Echocardiogram .  On beta-blockers.    Continue to monitor daily weight, input/output. Echocardiogram showed left ventricular ejection fraction of 70 to 75%,inderterminate  left ventricular diastolic parameters.On Lasix 40 mg daily at home. She was started on high-dose Lasix on admission.  She doesnot have any lower extremity edema.  Will give her a dose of lasix one dose She has H/O nonischemic cardiomyopathy and is status post ICD placement several years ago.  COPD: Currently not in exacerbation.  Continue as needed bronchodilators, home inhalers  Hypertension: On Norvasc and Coreg at home which have been  resumed.  Continue to monitor blood pressure.  Hypothyroidism: Continue Synthyroid  Hyperlipidemia: Continue statin  Dementia: Confused at baseline.  Continue supportive care.  Delirium precautions             DVT prophylaxis:Lovenox Code Status: Full Family Communication: Called and discussed with daughter on phone 01/12/20 Status is: Inpatient  Remains inpatient appropriate because:Inpatient level of care appropriate due to severity of illness   Dispo: The patient is from: Home              Anticipated d/c is to: SNF              Anticipated d/c date is: 2 days              Patient currently is not medically stable to d/c.     Consultants: orthopedics  Procedures:None  Antimicrobials:  Anti-infectives (From admission, onward)   None      Subjective: Patient seen and examined at the bedside this morning.  Hemodynamically stable.  Looks comfortable, not in any kind of distress but  still needing 2 L of oxygen per minute.  Does not have peripheral edema but has crackles bilaterally in the bases.  Complains of pain on bilateral feet  Objective: Vitals:   02/12/20 1400 02/12/20 2001 02/12/20 2046 02/13/20 0558  BP: 120/60  120/73 (!) 149/66  Pulse: 84   72 71  Resp: 20  18 17   Temp: 98.4 F (36.9 C)  98.4 F (36.9 C) 97.6 F (36.4 C)  TempSrc: Oral  Oral Oral  SpO2: 92% 92% 93% 94%  Weight:    63.6 kg  Height:        Intake/Output Summary (Last 24 hours) at 02/13/2020 0748 Last data filed at 02/13/2020 9629 Gross per 24 hour  Intake --  Output 400 ml  Net -400 ml   Filed Weights   02/11/20 2334 02/12/20 0500 02/13/20 0558  Weight: 63.6 kg 64 kg 63.6 kg    Examination:  General exam: Deconditioned, debilitated elderly female HEENT:PERRL,Oral mucosa moist, Ear/Nose normal on gross exam Respiratory system: Bilateral basal crackles  cardiovascular system: S1 & S2 heard, RRR. No JVD, murmurs, rubs, gallops or clicks. Gastrointestinal system: Abdomen is nondistended, soft and nontender. No organomegaly or masses felt. Normal bowel sounds heard. Central nervous system: Alert and awake, oriented to place only Extremities: No edema, no clubbing ,no cyanosis, cast on the left lower extremity, tenderness on the right heel. Skin: No rashes, lesions or ulcers,no icterus ,no pallor   Data Reviewed: I have personally reviewed following labs and imaging studies  CBC: Recent Labs  Lab 02/11/20 1646  WBC 11.4*  HGB 13.7  HCT 45.2  MCV 97.4  PLT 528   Basic Metabolic Panel: Recent Labs  Lab 02/11/20 1646 02/12/20 0422 02/13/20 0541  NA 131* 134* 135  K 4.6 4.8 5.0  CL 93* 95* 96*  CO2 28 32 32  GLUCOSE 121* 122* 129*  BUN 28* 28* 25*  CREATININE 0.86 0.83 0.71  CALCIUM 9.0 8.5* 8.6*   GFR: Estimated Creatinine Clearance: 41.8 mL/min (by C-G formula based on SCr of 0.71 mg/dL). Liver Function Tests: Recent Labs  Lab 02/11/20 1646  AST 30  ALT 37  ALKPHOS 97  BILITOT 0.5  PROT 8.0  ALBUMIN 3.4*   No results for input(s): LIPASE, AMYLASE in the last 168 hours. No results for input(s): AMMONIA in the last 168 hours. Coagulation Profile: Recent Labs  Lab 02/11/20 1646  INR 1.1   Cardiac Enzymes: No  results for input(s): CKTOTAL, CKMB, CKMBINDEX, TROPONINI in the last 168 hours. BNP (last 3 results) No results for input(s): PROBNP in the last 8760 hours. HbA1C: No results for input(s): HGBA1C in the last 72 hours. CBG: No results for input(s): GLUCAP in the last 168 hours. Lipid Profile: No results for input(s): CHOL, HDL, LDLCALC, TRIG, CHOLHDL, LDLDIRECT in the last 72 hours. Thyroid Function Tests: No results for input(s): TSH, T4TOTAL, FREET4, T3FREE, THYROIDAB in the last 72 hours. Anemia Panel: No results for input(s): VITAMINB12, FOLATE, FERRITIN, TIBC, IRON, RETICCTPCT in the last 72 hours. Sepsis Labs: No results for input(s): PROCALCITON, LATICACIDVEN in the last 168 hours.  Recent Results (from the past 240 hour(s))  Resp Panel by RT PCR (RSV, Flu A&B, Covid) - Nasopharyngeal Swab     Status: None   Collection Time: 02/11/20  7:47 PM   Specimen: Nasopharyngeal Swab  Result Value Ref Range Status   SARS Coronavirus 2 by RT PCR NEGATIVE NEGATIVE Final    Comment: (NOTE) SARS-CoV-2 target nucleic acids are  NOT DETECTED.  The SARS-CoV-2 RNA is generally detectable in upper respiratoy specimens during the acute phase of infection. The lowest concentration of SARS-CoV-2 viral copies this assay can detect is 131 copies/mL. A negative result does not preclude SARS-Cov-2 infection and should not be used as the sole basis for treatment or other patient management decisions. A negative result may occur with  improper specimen collection/handling, submission of specimen other than nasopharyngeal swab, presence of viral mutation(s) within the areas targeted by this assay, and inadequate number of viral copies (<131 copies/mL). A negative result must be combined with clinical observations, patient history, and epidemiological information. The expected result is Negative.  Fact Sheet for Patients:  PinkCheek.be  Fact Sheet for Healthcare  Providers:  GravelBags.it  This test is no t yet approved or cleared by the [place] FDA and  has been authorized for detection and/or diagnosis of SARS-CoV-2 by FDA under an Emergency Use Authorization (EUA). This EUA will remain  in effect (meaning this test can be used) for the duration of the COVID-19 declaration under Section 564(b)(1) of the Act, 21 U.S.C. section 360bbb-3(b)(1), unless the authorization is terminated or revoked sooner.     Influenza A by PCR NEGATIVE NEGATIVE Final   Influenza B by PCR NEGATIVE NEGATIVE Final    Comment: (NOTE) The Xpert Xpress SARS-CoV-2/FLU/RSV assay is intended as an aid in  the diagnosis of influenza from Nasopharyngeal swab specimens and  should not be used as a sole basis for treatment. Nasal washings and  aspirates are unacceptable for Xpert Xpress SARS-CoV-2/FLU/RSV  testing.  Fact Sheet for Patients: PinkCheek.be  Fact Sheet for Healthcare Providers: GravelBags.it  This test is not yet approved or cleared by the [place] FDA and  has been authorized for detection and/or diagnosis of SARS-CoV-2 by  FDA under an Emergency Use Authorization (EUA). This EUA will remain  in effect (meaning this test can be used) for the duration of the  Covid-19 declaration under Section 564(b)(1) of the Act, 21  U.S.C. section 360bbb-3(b)(1), unless the authorization is  terminated or revoked.    Respiratory Syncytial Virus by PCR NEGATIVE NEGATIVE Final    Comment: (NOTE) Fact Sheet for Patients: PinkCheek.be  Fact Sheet for Healthcare Providers: GravelBags.it  This test is not yet approved or cleared by the [place] FDA and  has been authorized for detection and/or diagnosis of SARS-CoV-2 by  FDA under an Emergency Use Authorization (EUA). This EUA will remain  in effect (meaning this  test can be used) for the duration of the  COVID-19 declaration under Section 564(b)(1) of the Act, 21 U.S.C.  section 360bbb-3(b)(1), unless the authorization is terminated or  revoked. Performed at [facility], [place] [address], [place], [place] [address]          Radiology Studies: DG Tibia/Fibula Left  Result Date: 02/11/2020 CLINICAL DATA:  Ankle fracture, fall, left lower leg, ankle and foot pain EXAM: LEFT TIBIA AND FIBULA - 2 VIEW LEFT FOOT - COMPLETE 3+ VIEW LEFT ANKLE COMPLETE - 3+ VIEW COMPARISON:  Left ankle radiograph 01/03/2012 FINDINGS: The osseous structures appear diffusely demineralized which may limit detection of small or nondisplaced fractures. Tiny crescentic radiodensity is seen along the posterior corner of the proximal tibia which is favored to be enthesopathic/degenerative in nature though could correlate for point tenderness. No additional proximal to mid tibia or fibular fractures are seen. Diffuse edematous changes of the lower leg are noted with associated skin thickening. Minimally comminuted, mildly  displaced trimalleolar fracture of the ankle including an obliquely oriented trans syndesmotic distal fibular fracture (Weber B), a transversely oriented medial malleolar avulsive type fracture, and a slightly superiorly displaced posterior malleolar fracture. Diffuse circumferential soft tissue swelling of the ankle is noted. Suboptimal mortise projection at the level of the ankle may limit complete accurate assessment however the constellation of fracture findings are compatible with a stage IV injury which is unstable. Possible avulsive type fracture seen of the lateral process of the talus and along the proximal lateral aspect of the cuboid. No additional fractures are seen in the foot. Alignment is grossly preserved though incompletely assessed on nonweightbearing view. Background of degenerative change throughout the foot. Bidirectional calcaneal  spurs. Suspect enthesopathic changes near the proximal pole of the navicular though could correlate for point tenderness. IMPRESSION: 1. Diffuse bony demineralization may limit detection of subtle or nondisplaced fractures. 2. Crescentic radiodensity along the posterolateral corner of the tibial plateau. Possibly degenerative though could correlate for point tenderness. 3. Minimally comminuted, mildly displaced trimalleolar fracture of the ankle in a pattern compatible with a Weber B stage IV unstable ankle injury. 4. Suspect small avulsive type fractures seen of the lateral process of the talus and along the proximal lateral aspect of the cuboid. Additional fragmentation along the proximal pole navicular is favored to be enthesopathic though correlate for point tenderness to exclude fracture. Electronically Signed   By: Lovena Le M.D.   On: 02/11/2020 19:10   DG Ankle Complete Left  Result Date: 02/11/2020 CLINICAL DATA:  Ankle fracture, fall, left lower leg, ankle and foot pain EXAM: LEFT TIBIA AND FIBULA - 2 VIEW LEFT FOOT - COMPLETE 3+ VIEW LEFT ANKLE COMPLETE - 3+ VIEW COMPARISON:  Left ankle radiograph 01/03/2012 FINDINGS: The osseous structures appear diffusely demineralized which may limit detection of small or nondisplaced fractures. Tiny crescentic radiodensity is seen along the posterior corner of the proximal tibia which is favored to be enthesopathic/degenerative in nature though could correlate for point tenderness. No additional proximal to mid tibia or fibular fractures are seen. Diffuse edematous changes of the lower leg are noted with associated skin thickening. Minimally comminuted, mildly displaced trimalleolar fracture of the ankle including an obliquely oriented trans syndesmotic distal fibular fracture (Weber B), a transversely oriented medial malleolar avulsive type fracture, and a slightly superiorly displaced posterior malleolar fracture. Diffuse circumferential soft tissue swelling  of the ankle is noted. Suboptimal mortise projection at the level of the ankle may limit complete accurate assessment however the constellation of fracture findings are compatible with a stage IV injury which is unstable. Possible avulsive type fracture seen of the lateral process of the talus and along the proximal lateral aspect of the cuboid. No additional fractures are seen in the foot. Alignment is grossly preserved though incompletely assessed on nonweightbearing view. Background of degenerative change throughout the foot. Bidirectional calcaneal spurs. Suspect enthesopathic changes near the proximal pole of the navicular though could correlate for point tenderness. IMPRESSION: 1. Diffuse bony demineralization may limit detection of subtle or nondisplaced fractures. 2. Crescentic radiodensity along the posterolateral corner of the tibial plateau. Possibly degenerative though could correlate for point tenderness. 3. Minimally comminuted, mildly displaced trimalleolar fracture of the ankle in a pattern compatible with a Weber B stage IV unstable ankle injury. 4. Suspect small avulsive type fractures seen of the lateral process of the talus and along the proximal lateral aspect of the cuboid. Additional fragmentation along the proximal pole navicular is favored to be enthesopathic though  correlate for point tenderness to exclude fracture. Electronically Signed   By: Lovena Le M.D.   On: 02/11/2020 19:10   DG Chest Port 1 View  Result Date: 02/11/2020 CLINICAL DATA:  Shortness of breath EXAM: PORTABLE CHEST 1 VIEW COMPARISON:  March 14, 2019 FINDINGS: The heart size and mediastinal contours mildly enlarged. Aortic knob calcifications are seen. A left-sided pacemaker seen with the lead tips at the right ventricle. Mildly increased interstitial markings are seen throughout both lungs. No acute osseous abnormality. IMPRESSION: Mildly increased interstitial markings which could be due to atelectasis and/or  interstitial edema. Electronically Signed   By: Prudencio Pair M.D.   On: 02/11/2020 17:09   DG Foot 2 Views Right  Result Date: 02/12/2020 CLINICAL DATA:  Pain and swelling along the posterior ankle. EXAM: RIGHT FOOT - 2 VIEW COMPARISON:  None. FINDINGS: There is an avulsion type fracture involving the posterosuperior aspect of the calcaneus. Difficult to identify the Achilles tendon. MRI may be helpful for further evaluation. The other bony structures appear intact. IMPRESSION: Avulsion type fracture involving the posterosuperior aspect of the calcaneus. Poor definition of the Achilles tendon. Electronically Signed   By: Marijo Sanes M.D.   On: 02/12/2020 18:03   DG Foot Complete Left  Result Date: 02/11/2020 CLINICAL DATA:  Ankle fracture, fall, left lower leg, ankle and foot pain EXAM: LEFT TIBIA AND FIBULA - 2 VIEW LEFT FOOT - COMPLETE 3+ VIEW LEFT ANKLE COMPLETE - 3+ VIEW COMPARISON:  Left ankle radiograph 01/03/2012 FINDINGS: The osseous structures appear diffusely demineralized which may limit detection of small or nondisplaced fractures. Tiny crescentic radiodensity is seen along the posterior corner of the proximal tibia which is favored to be enthesopathic/degenerative in nature though could correlate for point tenderness. No additional proximal to mid tibia or fibular fractures are seen. Diffuse edematous changes of the lower leg are noted with associated skin thickening. Minimally comminuted, mildly displaced trimalleolar fracture of the ankle including an obliquely oriented trans syndesmotic distal fibular fracture (Weber B), a transversely oriented medial malleolar avulsive type fracture, and a slightly superiorly displaced posterior malleolar fracture. Diffuse circumferential soft tissue swelling of the ankle is noted. Suboptimal mortise projection at the level of the ankle may limit complete accurate assessment however the constellation of fracture findings are compatible with a stage IV  injury which is unstable. Possible avulsive type fracture seen of the lateral process of the talus and along the proximal lateral aspect of the cuboid. No additional fractures are seen in the foot. Alignment is grossly preserved though incompletely assessed on nonweightbearing view. Background of degenerative change throughout the foot. Bidirectional calcaneal spurs. Suspect enthesopathic changes near the proximal pole of the navicular though could correlate for point tenderness. IMPRESSION: 1. Diffuse bony demineralization may limit detection of subtle or nondisplaced fractures. 2. Crescentic radiodensity along the posterolateral corner of the tibial plateau. Possibly degenerative though could correlate for point tenderness. 3. Minimally comminuted, mildly displaced trimalleolar fracture of the ankle in a pattern compatible with a Weber B stage IV unstable ankle injury. 4. Suspect small avulsive type fractures seen of the lateral process of the talus and along the proximal lateral aspect of the cuboid. Additional fragmentation along the proximal pole navicular is favored to be enthesopathic though correlate for point tenderness to exclude fracture. Electronically Signed   By: Lovena Le M.D.   On: 02/11/2020 19:10   ECHOCARDIOGRAM COMPLETE  Result Date: 02/12/2020    ECHOCARDIOGRAM REPORT   Patient Name:   [name]  L Mikhail Date of Exam: 02/12/2020 Medical Rec #:  [id number]     Height:       62.0 in Accession #:    [id number]    Weight:       141.1 lb Date of Birth:  Dec 22, 1928     BSA:          1.648 m Patient Age:    82 years      BP:           165/86 mmHg Patient Gender: F             HR:           75 bpm. Exam Location:  Inpatient Procedure: 2D Echo, Color Doppler and Cardiac Doppler Indications:    Q22.97 Acute diastolic (congestive) heart failure  History:        Patient has prior history of Echocardiogram examinations, most                 recent 10/18/2017. Pacemaker and Defibrillator, COPD,                  Arrythmias:Atrial Flutter; Risk Factors:Hypertension.  Sonographer:    Raquel Sarna Senior RDCS Referring Phys: [id number] [place]  1. Left ventricular ejection fraction, by estimation, is 70 to 75%. The left ventricle has hyperdynamic function. The left ventricle has no regional wall motion abnormalities. There is moderate concentric left ventricular hypertrophy. Left ventricular diastolic parameters are indeterminate.  2. Right ventricular systolic function is normal. The right ventricular size is normal. There is mildly elevated pulmonary artery systolic pressure. The estimated right ventricular systolic pressure is 41.7 mmHg.  3. Left atrial size was severely dilated.  4. Right atrial size was mildly dilated.  5. The mitral valve is degenerative. Trivial mitral valve regurgitation. Mild mitral stenosis. The mean mitral valve gradient is 5.2 mmHg with average heart rate of 73 bpm. Severe mitral annular calcification.  6. The aortic valve is abnormal. There is moderate calcification of the aortic valve. Aortic valve regurgitation is not visualized. Mild aortic valve stenosis. Aortic valve mean gradient measures 10.0 mmHg.  7. The inferior vena cava is dilated in size with <50% respiratory variability, suggesting right atrial pressure of 15 mmHg. FINDINGS  Left Ventricle: Left ventricular ejection fraction, by estimation, is 70 to 75%. The left ventricle has hyperdynamic function. The left ventricle has no regional wall motion abnormalities. The left ventricular internal cavity size was small. There is moderate concentric left ventricular hypertrophy. Left ventricular diastolic parameters are indeterminate. Right Ventricle: The right ventricular size is normal. No increase in right ventricular wall thickness. Right ventricular systolic function is normal. There is mildly elevated pulmonary artery systolic pressure. The tricuspid regurgitant velocity is 2.61  m/s, and with an assumed right atrial  pressure of 15 mmHg, the estimated right ventricular systolic pressure is 40.8 mmHg. Left Atrium: Left atrial size was severely dilated. Right Atrium: Right atrial size was mildly dilated. Pericardium: There is no evidence of pericardial effusion. Mitral Valve: The mitral valve is degenerative in appearance. Severe mitral annular calcification. Trivial mitral valve regurgitation. Mild mitral valve stenosis. MV peak gradient, 11.9 mmHg. The mean mitral valve gradient is 5.2 mmHg with average heart rate of 73 bpm. Tricuspid Valve: The tricuspid valve is grossly normal. Tricuspid valve regurgitation is trivial. Aortic Valve: The aortic valve is abnormal. There is moderate calcification of the aortic valve. Aortic valve regurgitation is not visualized. Mild aortic stenosis is present. Aortic valve mean  gradient measures 10.0 mmHg. Aortic valve peak gradient measures 16.6 mmHg. Aortic valve area, by VTI measures 1.75 cm. Pulmonic Valve: The pulmonic valve was not well visualized. Pulmonic valve regurgitation is trivial. Aorta: The aortic root is normal in size and structure. Venous: The inferior vena cava is dilated in size with less than 50% respiratory variability, suggesting right atrial pressure of 15 mmHg. IAS/Shunts: No atrial level shunt detected by color flow Doppler. Additional Comments: A pacer wire is visualized in the right atrium and right ventricle.  LEFT VENTRICLE PLAX 2D LVIDd:         3.13 cm  Diastology LVIDs:         1.85 cm  LV e' medial:    4.90 cm/s LV PW:         1.44 cm  LV E/e' medial:  17.8 LV IVS:        1.46 cm  LV e' lateral:   4.79 cm/s LVOT diam:     1.80 cm  LV E/e' lateral: 18.2 LV SV:         77 LV SV Index:   47 LVOT Area:     2.54 cm  RIGHT VENTRICLE RV S prime:     10.70 cm/s TAPSE (M-mode): 2.6 cm LEFT ATRIUM             Index       RIGHT ATRIUM           Index LA diam:        4.40 cm 2.67 cm/m  RA Area:     18.60 cm LA Vol (A2C):   84.2 ml 51.08 ml/m RA Volume:   51.30 ml   31.12 ml/m LA Vol (A4C):   80.5 ml 48.84 ml/m LA Biplane Vol: 84.6 ml 51.33 ml/m  AORTIC VALVE AV Area (Vmax):    1.59 cm AV Area (Vmean):   1.72 cm AV Area (VTI):     1.75 cm AV Vmax:           203.67 cm/s AV Vmean:          147.333 cm/s AV VTI:            0.443 m AV Peak Grad:      16.6 mmHg AV Mean Grad:      10.0 mmHg LVOT Vmax:         127.00 cm/s LVOT Vmean:        99.400 cm/s LVOT VTI:          0.304 m LVOT/AV VTI ratio: 0.69  AORTA Ao Root diam: 2.90 cm Ao Asc diam:  3.10 cm MITRAL VALVE                TRICUSPID VALVE MV Area (PHT): 1.22 cm     TR Peak grad:   27.2 mmHg MV Peak grad:  11.9 mmHg    TR Vmax:        261.00 cm/s MV Mean grad:  5.2 mmHg MV Vmax:       1.72 m/s     SHUNTS MV Vmean:      108.2 cm/s   Systemic VTI:  0.30 m MV Decel Time: 623 msec     Systemic Diam: 1.80 cm MV E velocity: 87.00 cm/s MV A velocity: 149.00 cm/s MV E/A ratio:  0.58 Cherlynn Kaiser MD Electronically signed by Cherlynn Kaiser MD Signature Date/Time: 02/12/2020/2:02:18 PM    Final         Scheduled Meds: . amLODipine  5 mg Oral Daily  . aspirin EC  81 mg Oral Daily  . carvedilol  12.5 mg Oral BID WC  . docusate sodium  100 mg Oral Daily  . DULoxetine  60 mg Oral Daily  . enoxaparin (LOVENOX) injection  40 mg Subcutaneous Q24H  . latanoprost  1 drop Both Eyes QHS  . levothyroxine  112 mcg Oral Q0600  . melatonin  10 mg Oral QHS  . mometasone-formoterol  2 puff Inhalation BID  . polyethylene glycol  17 g Oral Daily  . potassium chloride  10 mEq Oral BID  . sodium chloride flush  3 mL Intravenous Q12H   Continuous Infusions: . sodium chloride       LOS: 2 days    Time spent: 25 mins,More than 50% of that time was spent in counseling and/or coordination of care.      Shelly Coss, MD [facility] P11/17/2021, 7:48 AM

## 2020-02-14 DIAGNOSIS — I5033 Acute on chronic diastolic (congestive) heart failure: Secondary | ICD-10-CM | POA: Diagnosis not present

## 2020-02-14 LAB — BASIC METABOLIC PANEL
Anion gap: 9 (ref 5–15)
BUN: 23 mg/dL (ref 8–23)
CO2: 34 mmol/L — ABNORMAL HIGH (ref 22–32)
Calcium: 8.6 mg/dL — ABNORMAL LOW (ref 8.9–10.3)
Chloride: 92 mmol/L — ABNORMAL LOW (ref 98–111)
Creatinine, Ser: 0.7 mg/dL (ref 0.44–1.00)
GFR, Estimated: >60 mL/min (ref >60)
Glucose, Bld: 89 mg/dL (ref 70–99)
Potassium: 5 mmol/L (ref 3.5–5.1)
Sodium: 135 mmol/L (ref 135–145)

## 2020-02-14 MED ORDER — FUROSEMIDE 10 MG/ML IJ SOLN
40.0000 mg | Freq: Two times a day (BID) | INTRAMUSCULAR | Status: AC
  Administered 2020-02-14 – 2020-02-15 (×2): 40 mg via INTRAVENOUS
  Filled 2020-02-14 (×2): qty 4

## 2020-02-14 NOTE — TOC Initial Note (Addendum)
Transition of Care [facility]) - Initial/Assessment Note    Patient Details  Name: [name] MRN: [id number] Date of Birth: [birth year]  [facility]) CM/SW Contact:    Dessa Phi, RN Phone Number: 02/14/2020, 1:51 PM  Clinical Narrative:From home. PT recc SNF;NWB LLE.spoke to patient/dtr about d/c plans-agree to SNF-faxed out await pasrr, & bed offers.   3:32p-pasrr info submitted. Await bed offers.               Expected Discharge Plan: Skilled Nursing Facility Barriers to Discharge: Continued Medical Work up, SNF Pending bed offer   Patient Goals and CMS Choice Patient states their goals for this hospitalization and ongoing recovery are:: go to rehab CMS Medicare.gov Compare Post Acute Care list provided to:: Patient Represenative (must comment) Choice offered to / list presented to : Patient, Adult Children  Expected Discharge Plan and Services Expected Discharge Plan: [place]   Discharge Planning Services: CM Consult Post Acute Care Choice: [place] Living arrangements for the past 2 months: Single Family Home                                      Prior Living Arrangements/Services Living arrangements for the past 2 months: Single Family Home Lives with:: Adult Children Patient language and need for interpreter reviewed:: Yes Do you feel safe going back to the place where you live?: Yes      Need for Family Participation in Patient Care: No (Comment) Care giver support system in place?: Yes (comment)   Criminal Activity/Legal Involvement Pertinent to Current Situation/Hospitalization: No - Comment as needed  Activities of Daily Living Home Assistive Devices/Equipment: Walker (specify type), Eyeglasses ADL Screening (condition at time of admission) Patient's cognitive ability adequate to safely complete daily activities?: Yes Is the patient deaf or have difficulty hearing?: No Does the patient have difficulty  seeing, even when wearing glasses/contacts?: Yes Does the patient have difficulty concentrating, remembering, or making decisions?: Yes (slightly) Patient able to express need for assistance with ADLs?: Yes Does the patient have difficulty dressing or bathing?: No Independently performs ADLs?: No Communication: Independent Dressing (OT): Independent Grooming: Independent Feeding: Independent Bathing: Independent Toileting: Needs assistance Is this a change from baseline?: Change from baseline, expected to last >3days In/Out Bed: Needs assistance Is this a change from baseline?: Change from baseline, expected to last >3 days Walks in Home: Needs assistance (uses walker) Is this a change from baseline?: Change from baseline, expected to last >3 days Does the patient have difficulty walking or climbing stairs?: Yes Weakness of Legs: Left Weakness of Arms/Hands: None  Permission Sought/Granted Permission sought to share information with : Case Manager Permission granted to share information with : Yes, Verbal Permission Granted  Share Information with NAME: Case manager     Permission granted to share info w Relationship: [name] Hodapp [phone]     Emotional Assessment Appearance:: Appears stated age Attitude/Demeanor/Rapport: Gracious Affect (typically observed): Accepting Orientation: : Oriented to Self, Oriented to Place, Oriented to  Time, Oriented to Situation Alcohol / Substance Use: Not Applicable Psych Involvement: No (comment)  Admission diagnosis:  Closed trimalleolar fracture of left ankle, initial encounter [S82.852A] Acute on chronic congestive heart failure, unspecified heart failure type ([place]) [I50.9] Acute on chronic heart failure with preserved ejection fraction (HFpEF) ([place]) [I50.33] Patient Active Problem List   Diagnosis Date Noted  . Acute on chronic  heart failure with preserved ejection fraction (HFpEF) ([place]) 02/11/2020  . Essential hypertension  02/11/2020  . Fracture of left ankle, closed, initial encounter 02/11/2020  . Pacemaker 02/11/2020  . Fall at home, initial encounter 02/11/2020  . Prolonged QT interval 02/11/2020  . ICD (implantable cardioverter-defibrillator) battery depletion   . Pacemaker battery depletion 09/09/2019  . Acute respiratory failure ([place]) 03/15/2019  . COPD exacerbation ([place]) 03/15/2019  . Acute respiratory failure with hypoxia ([place]) 03/14/2019  . COPD with acute exacerbation ([place]) 03/14/2019  . Digoxin toxicity 10/04/2017  . Closed fracture of distal end of radius 06/29/2017  . Acute encephalopathy   . AKI (acute kidney injury) ([place])   . Dysarthria 09/29/2015  . Abnormal liver function 09/29/2015  . Acute renal failure (ARF) ([place]) 09/29/2015  . Hyponatremia 09/29/2015  . Small bowel obstruction ([place]) 05/17/2015  . Hypothyroidism 05/17/2015  . Uncontrolled hypertension 05/17/2015  . Hypocalcemia 05/17/2015  . Leukocytosis 05/17/2015  . Paroxysmal atrial flutter ([place]) 04/24/2015  . Incarcerated incisional hernia 03/28/2015  . SBO (small bowel obstruction) ([place]) 03/28/2015  . Nonischemic cardiomyopathy ([place]) 11/27/2012  . Biventricular ICD  11/27/2012  . Chronic diastolic heart failure ([place]) 11/27/2012  . Peripheral neuropathy secondary to chemotherapy 11/27/2012  . Hyperlipidemia 11/27/2012  . COPD (chronic obstructive pulmonary disease) ([place]) 12/03/2011  . DOE (dyspnea on exertion) 11/05/2011   PCP:  Carolee Rota, NP Pharmacy:   [id number] [place], [place] [address] Phone: [phone] Fax: [phone]  [facility] [place], [place] [address] [place] [address] Phone: [phone] Fax: [phone]     Social Determinants of Health (SDOH) Interventions    Readmission Risk Interventions No flowsheet data found.

## 2020-02-14 NOTE — Care Management Important Message (Signed)
Important Message  Patient Details IM Letter given to the Patient. Name: [name] MRN: [id number] Date of Birth: [birth year]   Medicare Important Message Given:  Yes     Kerin Salen 02/14/2020, 11:32 AM

## 2020-02-14 NOTE — Progress Notes (Signed)
Pt O2 removed, will continue to monitor.

## 2020-02-14 NOTE — Care Management (Signed)
Transition of Care (TOC) -30 day Note       Patient Details   Name: [name]  ZJI:[id number]  Date of Birth:[birth year]     Transition of Care [facility]) CM/SW Contact   Name:Rinda Rollyson [place]  Phone Number:[phone]  Date:02/14/2020  Time:1:43p     MUST FB:[id number]     To Whom it May Concern:     Please be advised that the above patient will require a short-term nursing home stay, anticipated 30 days or less rehabilitation and strengthening. The plan is for return home.

## 2020-02-14 NOTE — NC FL2 (Signed)
[place] LEVEL OF CARE SCREENING TOOL     IDENTIFICATION  Patient Name: [name] Birthdate: Jul 13, 1928 Sex: female Admission Date (Current Location): 02/11/2020  [facility] and [place] Number:  Herbalist and Address:  [facility],  [place] [address], [place]      Provider Number: [id number]  Attending Physician Name and Address:  Shelly Coss, MD  Relative Name and Phone Number:  Tisha Cline [phone]    Current Level of Care: Hospital Recommended Level of Care: [place] Prior Approval Number:    Date Approved/Denied:   PASRR Number: [id number]  Discharge Plan: SNF    Current Diagnoses: Patient Active Problem List   Diagnosis Date Noted  . Acute on chronic heart failure with preserved ejection fraction (HFpEF) ([place]) 02/11/2020  . Essential hypertension 02/11/2020  . Fracture of left ankle, closed, initial encounter 02/11/2020  . Pacemaker 02/11/2020  . Fall at home, initial encounter 02/11/2020  . Prolonged QT interval 02/11/2020  . ICD (implantable cardioverter-defibrillator) battery depletion   . Pacemaker battery depletion 09/09/2019  . Acute respiratory failure ([place]) 03/15/2019  . COPD exacerbation ([place]) 03/15/2019  . Acute respiratory failure with hypoxia ([place]) 03/14/2019  . COPD with acute exacerbation ([place]) 03/14/2019  . Digoxin toxicity 10/04/2017  . Closed fracture of distal end of radius 06/29/2017  . Acute encephalopathy   . AKI (acute kidney injury) ([place])   . Dysarthria 09/29/2015  . Abnormal liver function 09/29/2015  . Acute renal failure (ARF) ([place]) 09/29/2015  . Hyponatremia 09/29/2015  . Small bowel obstruction ([place]) 05/17/2015  . Hypothyroidism 05/17/2015  . Uncontrolled hypertension 05/17/2015  . Hypocalcemia 05/17/2015  . Leukocytosis 05/17/2015  . Paroxysmal atrial flutter ([place]) 04/24/2015  . Incarcerated incisional hernia 03/28/2015  . SBO (small bowel obstruction)  ([place]) 03/28/2015  . Nonischemic cardiomyopathy ([place]) 11/27/2012  . Biventricular ICD  11/27/2012  . Chronic diastolic heart failure ([place]) 11/27/2012  . Peripheral neuropathy secondary to chemotherapy 11/27/2012  . Hyperlipidemia 11/27/2012  . COPD (chronic obstructive pulmonary disease) ([place]) 12/03/2011  . DOE (dyspnea on exertion) 11/05/2011    Orientation RESPIRATION BLADDER Height & Weight     Self, Time, Situation, Place  O2 Continent Weight: 64.8 kg Height:  5\' 2"  (157.5 cm)  BEHAVIORAL SYMPTOMS/MOOD NEUROLOGICAL BOWEL NUTRITION STATUS      Continent Diet (Heart Healthy)  AMBULATORY STATUS COMMUNICATION OF NEEDS Skin   Limited Assist (NWB LLE) Verbally Normal                       Personal Care Assistance Level of Assistance  Bathing, Feeding, Dressing Bathing Assistance: Limited assistance Feeding assistance: Limited assistance Dressing Assistance: Limited assistance     Functional Limitations Info  Sight, Hearing, Speech Sight Info: Adequate Hearing Info: Adequate Speech Info: Adequate    SPECIAL CARE FACTORS FREQUENCY  PT (By licensed PT), OT (By licensed OT)     PT Frequency: 3x week OT Frequency: 3x week            Contractures Contractures Info: Not present    Additional Factors Info  Code Status, Psychotropic Code Status Info:  (Full)   Psychotropic Info:  (Cymbalta,desyrel see MAR)         Current Medications (02/14/2020):  This is the current hospital active medication list Current Facility-Administered Medications  Medication Dose Route Frequency Provider Last Rate Last Admin  . 0.9 %  sodium chloride infusion  250 mL Intravenous PRN  Chotiner, Yevonne Aline, MD      . acetaminophen (TYLENOL) tablet 650 mg  650 mg Oral Q4H PRN Chotiner, Yevonne Aline, MD   650 mg at 02/13/20 0626  . albuterol (VENTOLIN HFA) 108 (90 Base) MCG/ACT inhaler 1-2 puff  1-2 puff Inhalation Q6H PRN Chotiner, Yevonne Aline, MD      . amLODipine (NORVASC) tablet 5 mg  5 mg  Oral Daily Chotiner, Yevonne Aline, MD   5 mg at 02/14/20 1004  . aspirin EC tablet 81 mg  81 mg Oral Daily Chotiner, Yevonne Aline, MD   81 mg at 02/14/20 1006  . carvedilol (COREG) tablet 12.5 mg  12.5 mg Oral BID WC Chotiner, Yevonne Aline, MD   12.5 mg at 02/14/20 0923  . docusate sodium (COLACE) capsule 100 mg  100 mg Oral Daily Shelly Coss, MD   100 mg at 02/14/20 1006  . dorzolamide-timolol (COSOPT) 22.3-6.8 MG/ML ophthalmic solution 1 drop  1 drop Both Eyes BID PRN Chotiner, Yevonne Aline, MD   1 drop at 02/12/20 2134  . DULoxetine (CYMBALTA) DR capsule 60 mg  60 mg Oral Daily Chotiner, Yevonne Aline, MD   60 mg at 02/14/20 1005  . enoxaparin (LOVENOX) injection 40 mg  40 mg Subcutaneous Q24H Chotiner, Yevonne Aline, MD   40 mg at 02/14/20 0924  . furosemide (LASIX) injection 40 mg  40 mg Intravenous Q12H Shelly Coss, MD   40 mg at 02/14/20 1455  . latanoprost (XALATAN) 0.005 % ophthalmic solution 1 drop  1 drop Both Eyes QHS Chotiner, Yevonne Aline, MD   1 drop at 02/13/20 2117  . levothyroxine (SYNTHROID) tablet 112 mcg  112 mcg Oral Q0600 Chotiner, Yevonne Aline, MD   112 mcg at 02/14/20 0517  . melatonin tablet 10 mg  10 mg Oral QHS Chotiner, Yevonne Aline, MD   10 mg at 02/13/20 2116  . mometasone-formoterol (DULERA) 100-5 MCG/ACT inhaler 2 puff  2 puff Inhalation BID Chotiner, Yevonne Aline, MD   2 puff at 02/14/20 0835  . oxyCODONE (Oxy IR/ROXICODONE) immediate release tablet 15 mg  15 mg Oral Q6H PRN Chotiner, Yevonne Aline, MD   15 mg at 02/14/20 1455  . polyethylene glycol (MIRALAX / GLYCOLAX) packet 17 g  17 g Oral Daily Shelly Coss, MD   17 g at 02/14/20 1006  . sodium chloride flush (NS) 0.9 % injection 3 mL  3 mL Intravenous Q12H Chotiner, Yevonne Aline, MD   3 mL at 02/14/20 1456  . sodium chloride flush (NS) 0.9 % injection 3 mL  3 mL Intravenous PRN Chotiner, Yevonne Aline, MD      . traZODone (DESYREL) tablet 50 mg  50 mg Oral QHS PRN Chotiner, Yevonne Aline, MD         Discharge Medications: Please see discharge  summary for a list of discharge medications.  Relevant Imaging Results:  Relevant Lab Results:   Additional Information ss#[id number]  Jaydalynn Olivero, Juliann Pulse, [place]

## 2020-02-14 NOTE — Evaluation (Signed)
Occupational Therapy Evaluation Patient Details Name: [name] MRN: [id number] DOB: [birth year] Today's Date: 02/14/2020    History of Present Illness Pt is 86 yo female with hx including CHF, COPD, hypothyroidism, and AICD placement.  She presents s/p several falls at home.  She was found to have trimalleolar unstable ankle fx that is in splint and per ortho NWB with f/u in office for further tx options (no surgery at this time).   Clinical Impression   [name] is an 86 year old woman who presents with generalized weakness, decreased activity tolerance, NWB status on bilateral lower extremities, and complaints of pain resulting in a significant decline in functional status. Patient min assist for bed transfers and able to scoot 6 inches toward head of bed. Patient limited by complaints of back pain predominantly and pain in left ankle. Patient will benefit from skilled OT services in order to improve deficits and learn compensatory strategies as needed to learn how to perform scooting transfers, improve UB strength and learn how to perform sitting and bed level ADLs to improve ADL participation and reduce caregiver burden.      Follow Up Recommendations  SNF    Equipment Recommendations  Other (comment) (defer to next venue)    Recommendations for Other Services       Precautions / Restrictions Precautions Precautions: Fall Precaution Comments: NWB on BLE Required Braces or Orthoses: Splint/Cast Splint/Cast: Splint on L and R LE Restrictions Weight Bearing Restrictions: Yes RLE Weight Bearing: Non weight bearing LLE Weight Bearing: Non weight bearing      Mobility Bed Mobility Overal bed mobility: Needs Assistance Bed Mobility: Supine to Sit;Sit to Supine     Supine to sit: Min assist Sit to supine: Min assist   General bed mobility comments: Min assist to stabilize LLE due to pain with transfer to side of bed. Min assist for support of LEs to return to  supine. Patient using bed rails and needing increased time. Reports pain in left ankle with movement.    Transfers                 General transfer comment: Patient able to scoot 6 inches toward head of bed using upper extremities.    Balance   Sitting-balance support: No upper extremity supported;Feet supported Sitting balance-Leahy Scale: Good                                     ADL either performed or assessed with clinical judgement   ADL Overall ADL's : Needs assistance/impaired Eating/Feeding: Bed level;Independent   Grooming: Set up;Bed level   Upper Body Bathing: Set up;Bed level   Lower Body Bathing: Maximal assistance;Bed level   Upper Body Dressing : Moderate assistance;Bed level   Lower Body Dressing: Total assistance;Bed level     Toilet Transfer Details (indicate cue type and reason): unable Toileting- Clothing Manipulation and Hygiene: Total assistance;Bed level Toileting - Clothing Manipulation Details (indicate cue type and reason): use of external catheter at this time, and total assist for percare             Vision Patient Visual Report: No change from baseline       Perception     Praxis      Pertinent Vitals/Pain Pain Assessment: Faces Faces Pain Scale: Hurts whole lot Pain Location: Back Pain Descriptors / Indicators: Grimacing;Guarding;Discomfort;Aching Pain Intervention(s): Limited activity within patient's tolerance;Patient  requesting pain meds-RN notified     Hand Dominance     Extremity/Trunk Assessment Upper Extremity Assessment Upper Extremity Assessment: Generalized weakness   Lower Extremity Assessment Lower Extremity Assessment: Defer to PT evaluation       Communication Communication Communication: HOH   Cognition Arousal/Alertness: Awake/alert Behavior During Therapy: WFL for tasks assessed/performed Overall Cognitive Status: Within Functional Limits for tasks assessed                                      General Comments       Exercises     Shoulder Instructions      Home Living Family/patient expects to be discharged to:: Skilled nursing facility                             Home Equipment: Gilford Rile - 4 wheels;Shower seat;Grab bars - tub/shower;Grab bars - toilet;Cane - single point   Additional Comments: Pt reports lives with daughter and granddaughter (but granddaughter unable to assist).  Daughter works.      Prior Functioning/Environment Level of Independence: Independent with assistive device(s);Needs assistance  Gait / Transfers Assistance Needed: Reports could ambulate in home and short community distances with rollator or RW; several recent falls ADL's / Homemaking Assistance Needed: Reports able to do ADLs but with "great difficulty"and increased time; family assisted with IADLs   Comments: rollator; pt reports able to do ADLs but was difficult        OT Problem List: Decreased strength;Decreased activity tolerance;Decreased knowledge of use of DME or AE;Decreased knowledge of precautions;Pain      OT Treatment/Interventions: Self-care/ADL training;Therapeutic exercise;DME and/or AE instruction;Therapeutic activities;Patient/family education    OT Goals(Current goals can be found in the care plan section) Acute Rehab OT Goals Patient Stated Goal: to eventually go home OT Goal Formulation: With patient Time For Goal Achievement: 02/28/20 Potential to Achieve Goals: Fair  OT Frequency: Min 2X/week   Barriers to D/C:            Co-evaluation              AM-PAC OT "6 Clicks" Daily Activity     Outcome Measure Help from another person eating meals?: None Help from another person taking care of personal grooming?: A Little Help from another person toileting, which includes using toliet, bedpan, or urinal?: Total Help from another person bathing (including washing, rinsing, drying)?: A Lot Help from another person  to put on and taking off regular upper body clothing?: A Little Help from another person to put on and taking off regular lower body clothing?: Total 6 Click Score: 14   End of Session Nurse Communication: Mobility status  Activity Tolerance: Patient limited by pain Patient left: in bed;with call bell/phone within reach;with bed alarm set  OT Visit Diagnosis: Pain;History of falling (Z91.81);Muscle weakness (generalized) (M62.81) Pain - Right/Left:  (L ankle, back)                Time: 3818-2993 OT Time Calculation (min): 12 min Charges:  OT General Charges $OT Visit: 1 Visit OT Evaluation $OT Eval Moderate Complexity: 1 Mod  Khristi Schiller, OTR/L [place]  Office [phone] Pager: [place] 02/14/2020, 3:22 PM

## 2020-02-14 NOTE — Progress Notes (Signed)
PROGRESS NOTE    [name]  XBL:[id number] DOB: Nov 13, 1928 DOA: 02/11/2020 PCP: Carolee Rota, NP   Chief Complain: Shortness of breath, fall  Brief Narrative: Patient is an 86 year old female with history of congestive heart failure, COPD, hypothyroidism, status post AICD who presented to the emergency department with complaint of fall at home.  Also reported increasing shortness of breath that worsened with exertion.  On presentation she was found to have elevated BNP, chest x-ray showed mild interstitial edema.  She was requiring 2 L of oxygen for maintenance of her saturation.  X-rays showed  left ankle showed fracture and avulsion type fracture involving the posterosuperior aspect of the calcaneus .  Orthopedics consulted who recommended nonoperative management and outpatient follow-up and not weightbearing on both lower extremities.  PT/OT recommended skilled nursing  facility on discharge. Patient is medically stable for discharge to skilled nursing facility as soon as bed is available.  Assessment & Plan:   Principal Problem:   Acute on chronic heart failure with preserved ejection fraction (HFpEF) (HCC) Active Problems:   COPD (chronic obstructive pulmonary disease) (HCC)   ICD (implantable cardioverter-defibrillator) battery depletion   Essential hypertension   Fracture of left ankle, closed, initial encounter   Fall at home, initial encounter   Prolonged QT interval   Fall/left ankle fracture/right calcaneal fracture: Splint placed in the emergency department on the left .Xray showed Minimally comminuted, mildly displaced trimalleolar fracture of the ankle and also avulsion type fracture involving the posterosuperior aspect of the calcaneus. Orthopedics consulted who recommended nonoperative management and outpatient follow-up, but awaiting further recommendation for right calcaneal fracture.  PT/OT recommended skilled nursing  facility on discharge.  Acute hypoxic  respiratory failure: Requiring 2 L of oxygen per minute.  Not on oxygen at home.  Hypoxia secondary to congestive heart failure.  Patient was on oxygen at home in the past  but not now.  Dyspnea/acute on chronic diastolic congestive heart failure: Elevated BNP on presentation.  She is complaining of dyspnea on exertion.  Echocardiogram .  On beta-blockers.    Continue to monitor daily weight, input/output. Echocardiogram showed left ventricular ejection fraction of 70 to 75%,inderterminate  left ventricular diastolic parameters.On Lasix 40 mg daily at home. She was started on high-dose Lasix on admission.  She doesnot have any lower extremity edema.  Will give her 2 doses of lasix IV.She has H/O nonischemic cardiomyopathy and is status post ICD placement several years ago.  COPD: Currently not in exacerbation.  Continue as needed bronchodilators, home inhalers  Hypertension: On Norvasc and Coreg at home which have been  resumed.  Continue to monitor blood pressure.  Hypothyroidism: Continue Synthyroid  Hyperlipidemia: Continue statin  Dementia: Confused at baseline.  Continue supportive care.  Delirium precautions             DVT prophylaxis:Lovenox Code Status: Full Family Communication: Called and discussed with daughter on phone 01/12/20.Called again today,call not received Status is: Inpatient  Remains inpatient appropriate because:Inpatient level of care appropriate due to severity of illness   Dispo: The patient is from: Home              Anticipated d/c is to: SNF              Anticipated d/c date is: 2 days              Patient currently is not medically stable to d/c.     Consultants: orthopedics  Procedures:None  Antimicrobials:  Anti-infectives (From  admission, onward)   None      Subjective: Patient seen and examined at the bedside this morning.  Hemodynamically stable during my evaluation.  Overall comfortable.  Still on 2 L of oxygen per minute.   Denies any new complaints  Objective: Vitals:   02/13/20 1210 02/13/20 2044 02/13/20 2106 02/14/20 0541  BP: (!) 123/53  121/66 138/69  Pulse: 72  71 74  Resp: 16  18 17   Temp: 98.3 F (36.8 C)  98.7 F (37.1 C) 98.2 F (36.8 C)  TempSrc: Oral  Oral Oral  SpO2: 95% 93% 93% 94%  Weight:    64.8 kg  Height:        Intake/Output Summary (Last 24 hours) at 02/14/2020 0814 Last data filed at 02/14/2020 3009 Gross per 24 hour  Intake 480 ml  Output 1100 ml  Net -620 ml   Filed Weights   02/12/20 0500 02/13/20 0558 02/14/20 0541  Weight: 64 kg 63.6 kg 64.8 kg    Examination:   General exam: Deconditioned, debilitated elderly female Respiratory system: Bilateral basal crackles  cardiovascular system: S1 & S2 heard, RRR. No JVD, murmurs, rubs, gallops or clicks. Gastrointestinal system: Abdomen is nondistended, soft and nontender. No organomegaly or masses felt. Normal bowel sounds heard. Central nervous system: Alert and awake but oriented to place only  Extremities: No edema, no clubbing ,no cyanosis Skin: No rashes, lesions or ulcers,no icterus ,no pallor   Data Reviewed: I have personally reviewed following labs and imaging studies  CBC: Recent Labs  Lab 02/11/20 1646  WBC 11.4*  HGB 13.7  HCT 45.2  MCV 97.4  PLT 233   Basic Metabolic Panel: Recent Labs  Lab 02/11/20 1646 02/12/20 0422 02/13/20 0541 02/14/20 0532  NA 131* 134* 135 135  K 4.6 4.8 5.0 5.0  CL 93* 95* 96* 92*  CO2 28 32 32 34*  GLUCOSE 121* 122* 129* 89  BUN 28* 28* 25* 23  CREATININE 0.86 0.83 0.71 0.70  CALCIUM 9.0 8.5* 8.6* 8.6*   GFR: Estimated Creatinine Clearance: 42.1 mL/min (by C-G formula based on SCr of 0.7 mg/dL). Liver Function Tests: Recent Labs  Lab 02/11/20 1646  AST 30  ALT 37  ALKPHOS 97  BILITOT 0.5  PROT 8.0  ALBUMIN 3.4*   No results for input(s): LIPASE, AMYLASE in the last 168 hours. No results for input(s): AMMONIA in the last 168 hours. Coagulation  Profile: Recent Labs  Lab 02/11/20 1646  INR 1.1   Cardiac Enzymes: No results for input(s): CKTOTAL, CKMB, CKMBINDEX, TROPONINI in the last 168 hours. BNP (last 3 results) No results for input(s): PROBNP in the last 8760 hours. HbA1C: No results for input(s): HGBA1C in the last 72 hours. CBG: No results for input(s): GLUCAP in the last 168 hours. Lipid Profile: No results for input(s): CHOL, HDL, LDLCALC, TRIG, CHOLHDL, LDLDIRECT in the last 72 hours. Thyroid Function Tests: No results for input(s): TSH, T4TOTAL, FREET4, T3FREE, THYROIDAB in the last 72 hours. Anemia Panel: No results for input(s): VITAMINB12, FOLATE, FERRITIN, TIBC, IRON, RETICCTPCT in the last 72 hours. Sepsis Labs: No results for input(s): PROCALCITON, LATICACIDVEN in the last 168 hours.  Recent Results (from the past 240 hour(s))  Resp Panel by RT PCR (RSV, Flu A&B, Covid) - Nasopharyngeal Swab     Status: None   Collection Time: 02/11/20  7:47 PM   Specimen: Nasopharyngeal Swab  Result Value Ref Range Status   SARS Coronavirus 2 by RT PCR NEGATIVE NEGATIVE Final  Comment: (NOTE) SARS-CoV-2 target nucleic acids are NOT DETECTED.  The SARS-CoV-2 RNA is generally detectable in upper respiratoy specimens during the acute phase of infection. The lowest concentration of SARS-CoV-2 viral copies this assay can detect is 131 copies/mL. A negative result does not preclude SARS-Cov-2 infection and should not be used as the sole basis for treatment or other patient management decisions. A negative result may occur with  improper specimen collection/handling, submission of specimen other than nasopharyngeal swab, presence of viral mutation(s) within the areas targeted by this assay, and inadequate number of viral copies (<131 copies/mL). A negative result must be combined with clinical observations, patient history, and epidemiological information. The expected result is Negative.  Fact Sheet for Patients:    PinkCheek.be  Fact Sheet for Healthcare Providers:  GravelBags.it  This test is no t yet approved or cleared by the [place] FDA and  has been authorized for detection and/or diagnosis of SARS-CoV-2 by FDA under an Emergency Use Authorization (EUA). This EUA will remain  in effect (meaning this test can be used) for the duration of the COVID-19 declaration under Section 564(b)(1) of the Act, 21 U.S.C. section 360bbb-3(b)(1), unless the authorization is terminated or revoked sooner.     Influenza A by PCR NEGATIVE NEGATIVE Final   Influenza B by PCR NEGATIVE NEGATIVE Final    Comment: (NOTE) The Xpert Xpress SARS-CoV-2/FLU/RSV assay is intended as an aid in  the diagnosis of influenza from Nasopharyngeal swab specimens and  should not be used as a sole basis for treatment. Nasal washings and  aspirates are unacceptable for Xpert Xpress SARS-CoV-2/FLU/RSV  testing.  Fact Sheet for Patients: PinkCheek.be  Fact Sheet for Healthcare Providers: GravelBags.it  This test is not yet approved or cleared by the [place] FDA and  has been authorized for detection and/or diagnosis of SARS-CoV-2 by  FDA under an Emergency Use Authorization (EUA). This EUA will remain  in effect (meaning this test can be used) for the duration of the  Covid-19 declaration under Section 564(b)(1) of the Act, 21  U.S.C. section 360bbb-3(b)(1), unless the authorization is  terminated or revoked.    Respiratory Syncytial Virus by PCR NEGATIVE NEGATIVE Final    Comment: (NOTE) Fact Sheet for Patients: PinkCheek.be  Fact Sheet for Healthcare Providers: GravelBags.it  This test is not yet approved or cleared by the [place] FDA and  has been authorized for detection and/or diagnosis of SARS-CoV-2 by  FDA under an Emergency  Use Authorization (EUA). This EUA will remain  in effect (meaning this test can be used) for the duration of the  COVID-19 declaration under Section 564(b)(1) of the Act, 21 U.S.C.  section 360bbb-3(b)(1), unless the authorization is terminated or  revoked. Performed at [facility], [place] [address], [name], [place] [address]          Radiology Studies: CT FOOT RIGHT WO CONTRAST  Result Date: 02/13/2020 CLINICAL DATA:  Right foot fracture. EXAM: CT OF THE RIGHT FOOT WITHOUT CONTRAST TECHNIQUE: Multidetector CT imaging of the right foot was performed according to the standard protocol. Multiplanar CT image reconstructions were also generated. COMPARISON:  Radiographs 02/12/2020 and 01/03/2012 FINDINGS: Bones/Joint/Cartilage The bones appear severely demineralized. As seen on the prior radiographs, there is a mildly displaced avulsion fracture of the superior aspect of the calcaneal tuberosity. There is no extension of this fracture into the subtalar joint which appears intact. No other evidence of acute fracture or dislocation. There are mild midfoot and 1st MTP  joint degenerative changes. Type 2 accessory navicular noted. Ligaments Suboptimally assessed by CT. Muscles and Tendons The Achilles tendon appears intact and inserts on the calcaneal avulsion fracture. The additional ankle tendons appear intact. Generalized atrophy throughout the foot musculature. Soft tissues No focal hematoma, soft tissue emphysema or foreign body. Minimal atherosclerosis noted. IMPRESSION: 1. Mildly displaced avulsion fracture of the superior aspect of the calcaneal tuberosity. No extension of this fracture into the subtalar joint which appears intact. Given underlying osteopenia, this is likely an insufficiency fracture. 2. No other evidence of acute fracture or dislocation. 3. Intact Achilles tendon. Electronically Signed   By: Richardean Sale M.D.   On: 02/13/2020 19:31   DG Foot 2 Views  Right  Result Date: 02/12/2020 CLINICAL DATA:  Pain and swelling along the posterior ankle. EXAM: RIGHT FOOT - 2 VIEW COMPARISON:  None. FINDINGS: There is an avulsion type fracture involving the posterosuperior aspect of the calcaneus. Difficult to identify the Achilles tendon. MRI may be helpful for further evaluation. The other bony structures appear intact. IMPRESSION: Avulsion type fracture involving the posterosuperior aspect of the calcaneus. Poor definition of the Achilles tendon. Electronically Signed   By: Marijo Sanes M.D.   On: 02/12/2020 18:03   ECHOCARDIOGRAM COMPLETE  Result Date: 02/12/2020    ECHOCARDIOGRAM REPORT   Patient Name:   LAKE BREEDING Date of Exam: 02/12/2020 Medical Rec #:  [id number]     Height:       62.0 in Accession #:    [id number]    Weight:       141.1 lb Date of Birth:  1928-11-14     BSA:          1.648 m Patient Age:    66 years      BP:           165/86 mmHg Patient Gender: F             HR:           75 bpm. Exam Location:  Inpatient Procedure: 2D Echo, Color Doppler and Cardiac Doppler Indications:    L93.79 Acute diastolic (congestive) heart failure  History:        Patient has prior history of Echocardiogram examinations, most                 recent 10/18/2017. Pacemaker and Defibrillator, COPD,                 Arrythmias:Atrial Flutter; Risk Factors:Hypertension.  Sonographer:    Raquel Sarna Senior RDCS Referring Phys: [id number] [place]  1. Left ventricular ejection fraction, by estimation, is 70 to 75%. The left ventricle has hyperdynamic function. The left ventricle has no regional wall motion abnormalities. There is moderate concentric left ventricular hypertrophy. Left ventricular diastolic parameters are indeterminate.  2. Right ventricular systolic function is normal. The right ventricular size is normal. There is mildly elevated pulmonary artery systolic pressure. The estimated right ventricular systolic pressure is 53.2 mmHg.  3. Left atrial  size was severely dilated.  4. Right atrial size was mildly dilated.  5. The mitral valve is degenerative. Trivial mitral valve regurgitation. Mild mitral stenosis. The mean mitral valve gradient is 5.2 mmHg with average heart rate of 73 bpm. Severe mitral annular calcification.  6. The aortic valve is abnormal. There is moderate calcification of the aortic valve. Aortic valve regurgitation is not visualized. Mild aortic valve stenosis. Aortic valve mean gradient measures 10.0 mmHg.  7. The inferior  vena cava is dilated in size with <50% respiratory variability, suggesting right atrial pressure of 15 mmHg. FINDINGS  Left Ventricle: Left ventricular ejection fraction, by estimation, is 70 to 75%. The left ventricle has hyperdynamic function. The left ventricle has no regional wall motion abnormalities. The left ventricular internal cavity size was small. There is moderate concentric left ventricular hypertrophy. Left ventricular diastolic parameters are indeterminate. Right Ventricle: The right ventricular size is normal. No increase in right ventricular wall thickness. Right ventricular systolic function is normal. There is mildly elevated pulmonary artery systolic pressure. The tricuspid regurgitant velocity is 2.61  m/s, and with an assumed right atrial pressure of 15 mmHg, the estimated right ventricular systolic pressure is 86.7 mmHg. Left Atrium: Left atrial size was severely dilated. Right Atrium: Right atrial size was mildly dilated. Pericardium: There is no evidence of pericardial effusion. Mitral Valve: The mitral valve is degenerative in appearance. Severe mitral annular calcification. Trivial mitral valve regurgitation. Mild mitral valve stenosis. MV peak gradient, 11.9 mmHg. The mean mitral valve gradient is 5.2 mmHg with average heart rate of 73 bpm. Tricuspid Valve: The tricuspid valve is grossly normal. Tricuspid valve regurgitation is trivial. Aortic Valve: The aortic valve is abnormal. There is  moderate calcification of the aortic valve. Aortic valve regurgitation is not visualized. Mild aortic stenosis is present. Aortic valve mean gradient measures 10.0 mmHg. Aortic valve peak gradient measures 16.6 mmHg. Aortic valve area, by VTI measures 1.75 cm. Pulmonic Valve: The pulmonic valve was not well visualized. Pulmonic valve regurgitation is trivial. Aorta: The aortic root is normal in size and structure. Venous: The inferior vena cava is dilated in size with less than 50% respiratory variability, suggesting right atrial pressure of 15 mmHg. IAS/Shunts: No atrial level shunt detected by color flow Doppler. Additional Comments: A pacer wire is visualized in the right atrium and right ventricle.  LEFT VENTRICLE PLAX 2D LVIDd:         3.13 cm  Diastology LVIDs:         1.85 cm  LV e' medial:    4.90 cm/s LV PW:         1.44 cm  LV E/e' medial:  17.8 LV IVS:        1.46 cm  LV e' lateral:   4.79 cm/s LVOT diam:     1.80 cm  LV E/e' lateral: 18.2 LV SV:         77 LV SV Index:   47 LVOT Area:     2.54 cm  RIGHT VENTRICLE RV S prime:     10.70 cm/s TAPSE (M-mode): 2.6 cm LEFT ATRIUM             Index       RIGHT ATRIUM           Index LA diam:        4.40 cm 2.67 cm/m  RA Area:     18.60 cm LA Vol (A2C):   84.2 ml 51.08 ml/m RA Volume:   51.30 ml  31.12 ml/m LA Vol (A4C):   80.5 ml 48.84 ml/m LA Biplane Vol: 84.6 ml 51.33 ml/m  AORTIC VALVE AV Area (Vmax):    1.59 cm AV Area (Vmean):   1.72 cm AV Area (VTI):     1.75 cm AV Vmax:           203.67 cm/s AV Vmean:          147.333 cm/s AV VTI:  0.443 m AV Peak Grad:      16.6 mmHg AV Mean Grad:      10.0 mmHg LVOT Vmax:         127.00 cm/s LVOT Vmean:        99.400 cm/s LVOT VTI:          0.304 m LVOT/AV VTI ratio: 0.69  AORTA Ao Root diam: 2.90 cm Ao Asc diam:  3.10 cm MITRAL VALVE                TRICUSPID VALVE MV Area (PHT): 1.22 cm     TR Peak grad:   27.2 mmHg MV Peak grad:  11.9 mmHg    TR Vmax:        261.00 cm/s MV Mean grad:  5.2 mmHg  MV Vmax:       1.72 m/s     SHUNTS MV Vmean:      108.2 cm/s   Systemic VTI:  0.30 m MV Decel Time: 623 msec     Systemic Diam: 1.80 cm MV E velocity: 87.00 cm/s MV A velocity: 149.00 cm/s MV E/A ratio:  0.58 Cherlynn Kaiser MD Electronically signed by Cherlynn Kaiser MD Signature Date/Time: 02/12/2020/2:02:18 PM    Final         Scheduled Meds: . amLODipine  5 mg Oral Daily  . aspirin EC  81 mg Oral Daily  . carvedilol  12.5 mg Oral BID WC  . docusate sodium  100 mg Oral Daily  . DULoxetine  60 mg Oral Daily  . enoxaparin (LOVENOX) injection  40 mg Subcutaneous Q24H  . latanoprost  1 drop Both Eyes QHS  . levothyroxine  112 mcg Oral Q0600  . melatonin  10 mg Oral QHS  . mometasone-formoterol  2 puff Inhalation BID  . polyethylene glycol  17 g Oral Daily  . sodium chloride flush  3 mL Intravenous Q12H   Continuous Infusions: . sodium chloride       LOS: 3 days    Time spent: 25 mins,More than 50% of that time was spent in counseling and/or coordination of care.      Shelly Coss, MD [facility] P11/18/2021, 8:14 AM

## 2020-02-14 NOTE — Progress Notes (Signed)
Orthopedic Tech Progress Note Patient Details:  [name] February 07, 1929 [id number]  Patient ID: [name], female   DOB: 25-Jul-1928, 86 y.o.   MRN: [id number]   [name] 02/14/2020, 10:51 AM Short posterior splint applied to right leg. Well padded. ADB pad added for extra cushion around heel.

## 2020-02-14 NOTE — NC FL2 (Signed)
[place] LEVEL OF CARE SCREENING TOOL     IDENTIFICATION  Patient Name: [name] Birthdate: Mar 17, 1929 Sex: female Admission Date (Current Location): 02/11/2020  [facility] and [place] Number:  Herbalist and Address:  [facility],  [place] [address], [place]      Provider Number: [id number]  Attending Physician Name and Address:  Shelly Coss, MD  Relative Name and Phone Number:  Orene Abbasi [phone]    Current Level of Care: Hospital Recommended Level of Care: [place] Prior Approval Number:    Date Approved/Denied:   PASRR Number:    Discharge Plan: SNF    Current Diagnoses: Patient Active Problem List   Diagnosis Date Noted  . Acute on chronic heart failure with preserved ejection fraction (HFpEF) ([place]) 02/11/2020  . Essential hypertension 02/11/2020  . Fracture of left ankle, closed, initial encounter 02/11/2020  . Pacemaker 02/11/2020  . Fall at home, initial encounter 02/11/2020  . Prolonged QT interval 02/11/2020  . ICD (implantable cardioverter-defibrillator) battery depletion   . Pacemaker battery depletion 09/09/2019  . Acute respiratory failure ([place]) 03/15/2019  . COPD exacerbation ([place]) 03/15/2019  . Acute respiratory failure with hypoxia ([place]) 03/14/2019  . COPD with acute exacerbation ([place]) 03/14/2019  . Digoxin toxicity 10/04/2017  . Closed fracture of distal end of radius 06/29/2017  . Acute encephalopathy   . AKI (acute kidney injury) ([place])   . Dysarthria 09/29/2015  . Abnormal liver function 09/29/2015  . Acute renal failure (ARF) ([place]) 09/29/2015  . Hyponatremia 09/29/2015  . Small bowel obstruction ([place]) 05/17/2015  . Hypothyroidism 05/17/2015  . Uncontrolled hypertension 05/17/2015  . Hypocalcemia 05/17/2015  . Leukocytosis 05/17/2015  . Paroxysmal atrial flutter ([place]) 04/24/2015  . Incarcerated incisional hernia 03/28/2015  . SBO (small bowel obstruction) ([place])  03/28/2015  . Nonischemic cardiomyopathy ([place]) 11/27/2012  . Biventricular ICD  11/27/2012  . Chronic diastolic heart failure ([place]) 11/27/2012  . Peripheral neuropathy secondary to chemotherapy 11/27/2012  . Hyperlipidemia 11/27/2012  . COPD (chronic obstructive pulmonary disease) ([place]) 12/03/2011  . DOE (dyspnea on exertion) 11/05/2011    Orientation RESPIRATION BLADDER Height & Weight     Self, Time, Situation, Place  O2 Continent Weight: 64.8 kg Height:  5\' 2"  (157.5 cm)  BEHAVIORAL SYMPTOMS/MOOD NEUROLOGICAL BOWEL NUTRITION STATUS      Continent Diet (Heart Healthy)  AMBULATORY STATUS COMMUNICATION OF NEEDS Skin   Limited Assist (NWB LLE) Verbally Normal                       Personal Care Assistance Level of Assistance  Bathing, Feeding, Dressing Bathing Assistance: Limited assistance Feeding assistance: Limited assistance Dressing Assistance: Limited assistance     Functional Limitations Info  Sight, Hearing, Speech Sight Info: Adequate Hearing Info: Adequate Speech Info: Adequate    SPECIAL CARE FACTORS FREQUENCY  PT (By licensed PT), OT (By licensed OT)     PT Frequency: 3x week OT Frequency: 3x week            Contractures Contractures Info: Not present    Additional Factors Info  Code Status, Psychotropic Code Status Info:  (Full)   Psychotropic Info:  (Cymbalta,desyrel see MAR)         Current Medications (02/14/2020):  This is the current hospital active medication list Current Facility-Administered Medications  Medication Dose Route Frequency Provider Last Rate Last Admin  . 0.9 %  sodium chloride infusion  250 mL Intravenous  PRN Chotiner, Yevonne Aline, MD      . acetaminophen (TYLENOL) tablet 650 mg  650 mg Oral Q4H PRN Chotiner, Yevonne Aline, MD   650 mg at 02/13/20 0626  . albuterol (VENTOLIN HFA) 108 (90 Base) MCG/ACT inhaler 1-2 puff  1-2 puff Inhalation Q6H PRN Chotiner, Yevonne Aline, MD      . amLODipine (NORVASC) tablet 5 mg  5 mg Oral  Daily Chotiner, Yevonne Aline, MD   5 mg at 02/14/20 1004  . aspirin EC tablet 81 mg  81 mg Oral Daily Chotiner, Yevonne Aline, MD   81 mg at 02/14/20 1006  . carvedilol (COREG) tablet 12.5 mg  12.5 mg Oral BID WC Chotiner, Yevonne Aline, MD   12.5 mg at 02/14/20 0923  . docusate sodium (COLACE) capsule 100 mg  100 mg Oral Daily Shelly Coss, MD   100 mg at 02/14/20 1006  . dorzolamide-timolol (COSOPT) 22.3-6.8 MG/ML ophthalmic solution 1 drop  1 drop Both Eyes BID PRN Chotiner, Yevonne Aline, MD   1 drop at 02/12/20 2134  . DULoxetine (CYMBALTA) DR capsule 60 mg  60 mg Oral Daily Chotiner, Yevonne Aline, MD   60 mg at 02/14/20 1005  . enoxaparin (LOVENOX) injection 40 mg  40 mg Subcutaneous Q24H Chotiner, Yevonne Aline, MD   40 mg at 02/14/20 0924  . furosemide (LASIX) injection 40 mg  40 mg Intravenous Q12H Adhikari, Amrit, MD      . latanoprost (XALATAN) 0.005 % ophthalmic solution 1 drop  1 drop Both Eyes QHS Chotiner, Yevonne Aline, MD   1 drop at 02/13/20 2117  . levothyroxine (SYNTHROID) tablet 112 mcg  112 mcg Oral Q0600 Chotiner, Yevonne Aline, MD   112 mcg at 02/14/20 0517  . melatonin tablet 10 mg  10 mg Oral QHS Chotiner, Yevonne Aline, MD   10 mg at 02/13/20 2116  . mometasone-formoterol (DULERA) 100-5 MCG/ACT inhaler 2 puff  2 puff Inhalation BID Chotiner, Yevonne Aline, MD   2 puff at 02/14/20 0835  . oxyCODONE (Oxy IR/ROXICODONE) immediate release tablet 15 mg  15 mg Oral Q6H PRN Chotiner, Yevonne Aline, MD   15 mg at 02/14/20 0704  . polyethylene glycol (MIRALAX / GLYCOLAX) packet 17 g  17 g Oral Daily Shelly Coss, MD   17 g at 02/14/20 1006  . sodium chloride flush (NS) 0.9 % injection 3 mL  3 mL Intravenous Q12H Chotiner, Yevonne Aline, MD   3 mL at 02/13/20 2118  . sodium chloride flush (NS) 0.9 % injection 3 mL  3 mL Intravenous PRN Chotiner, Yevonne Aline, MD      . traZODone (DESYREL) tablet 50 mg  50 mg Oral QHS PRN Chotiner, Yevonne Aline, MD         Discharge Medications: Please see discharge summary for a list of  discharge medications.  Relevant Imaging Results:  Relevant Lab Results:   Additional Information ss#[id number]  Tammera Engert, Juliann Pulse, [place]

## 2020-02-14 NOTE — Progress Notes (Signed)
I reviewed the CT scan of her right calcaneus fracture.  We will plan to treat this nonoperatively.  On exam her skin was not compromised.  We will order a short leg splint for that side.  Recommend nonweightbearing for the right lower extremity and the left lower extremity unfortunately due to her bilateral injuries.  I had a discussion with her yesterday about nonoperative treatment for her left side too.  She is in agreement with this.  She has a minimal displacement of her fractures.  We will plan to see her back in the office in 2 weeks for repeat x-rays to ensure no interval displacement.  She is suitable for discharge when appropriate per hospitalist team. Nonweightbearing bilateral lower extremities.

## 2020-02-15 ENCOUNTER — Inpatient Hospital Stay (HOSPITAL_COMMUNITY): Payer: Medicare Other

## 2020-02-15 DIAGNOSIS — I5033 Acute on chronic diastolic (congestive) heart failure: Secondary | ICD-10-CM | POA: Diagnosis not present

## 2020-02-15 LAB — BASIC METABOLIC PANEL
Anion gap: 9 (ref 5–15)
BUN: 19 mg/dL (ref 8–23)
CO2: 39 mmol/L — ABNORMAL HIGH (ref 22–32)
Calcium: 8.8 mg/dL — ABNORMAL LOW (ref 8.9–10.3)
Chloride: 88 mmol/L — ABNORMAL LOW (ref 98–111)
Creatinine, Ser: 0.72 mg/dL (ref 0.44–1.00)
GFR, Estimated: >60 mL/min (ref >60)
Glucose, Bld: 103 mg/dL — ABNORMAL HIGH (ref 70–99)
Potassium: 4.1 mmol/L (ref 3.5–5.1)
Sodium: 136 mmol/L (ref 135–145)

## 2020-02-15 NOTE — Progress Notes (Signed)
PROGRESS NOTE    [name]  JXB:[id number] DOB: [birth year] DOA: 02/11/2020 PCP: Carolee Rota, NP   Chief Complain: Shortness of breath, fall  Brief Narrative: Patient is an 86 year old female with history of congestive heart failure, COPD, hypothyroidism, status post AICD who presented to the emergency department with complaint of fall at home.  Also reported increasing shortness of breath that worsened with exertion.  On presentation she was found to have elevated BNP, chest x-ray showed mild interstitial edema.  She was requiring 2 L of oxygen for maintenance of her saturation.  X-rays showed  left ankle showed fracture and avulsion type fracture involving the posterosuperior aspect of the calcaneus .  Orthopedics consulted who recommended nonoperative management and outpatient follow-up and not weightbearing on both lower extremities.  PT/OT recommended skilled nursing  facility on discharge. Patient is medically stable for discharge to skilled nursing facility as soon as bed is available.  Assessment & Plan:   Principal Problem:   Acute on chronic heart failure with preserved ejection fraction (HFpEF) (HCC) Active Problems:   COPD (chronic obstructive pulmonary disease) (HCC)   ICD (implantable cardioverter-defibrillator) battery depletion   Essential hypertension   Fracture of left ankle, closed, initial encounter   Fall at home, initial encounter   Prolonged QT interval   Fall/left ankle fracture/right calcaneal fracture: Splint placed in the emergency department on the left .Xray showed Minimally comminuted, mildly displaced trimalleolar fracture of the ankle and also avulsion type fracture involving the posterosuperior aspect of the calcaneus. Orthopedics consulted who recommended nonoperative management and outpatient follow-up, but awaiting further recommendation for right calcaneal fracture.  PT/OT recommended skilled nursing  facility on discharge.  Acute hypoxic  respiratory failure: Requiring 2 L of oxygen per minute.  Not on oxygen at home.  Hypoxia secondary to congestive heart failure.  Patient was on oxygen at home in the past  but not now.  She has bilateral basal crackles.  Will f/u CXR  Dyspnea/acute on chronic diastolic congestive heart failure: Elevated BNP on presentation.  She is complaining of dyspnea on exertion.  Echocardiogram .  On beta-blockers.    Continue to monitor daily weight, input/output. Echocardiogram showed left ventricular ejection fraction of 70 to 75%,inderterminate  left ventricular diastolic parameters.On Lasix 40 mg daily at home. She was started on high-dose Lasix on admission.  She doesnot have any lower extremity edema.  S/P few doses  of lasix IV.She has H/O nonischemic cardiomyopathy and is status post ICD placement several years ago.  COPD: Currently not in exacerbation.  Continue as needed bronchodilators, home inhalers  Hypertension: On Norvasc and Coreg at home which have been  resumed.  Continue to monitor blood pressure.  Hypothyroidism: Continue Synthyroid  Hyperlipidemia: Continue statin  Dementia: Intermittently confused at baseline.  Continue supportive care.  Delirium precautions.  Currently oriented          DVT prophylaxis:Lovenox Code Status: Full Family Communication: Called and discussed with daughter on phone 01/12/20. Status is: Inpatient  Remains inpatient appropriate because:Inpatient level of care appropriate due to severity of illness   Dispo: The patient is from: Home              Anticipated d/c is to: SNF              Anticipated d/c date is: 2 days              Patient currently is not medically stable to d/c.     Consultants: orthopedics  Procedures:None  Antimicrobials:  Anti-infectives (From admission, onward)   None      Subjective:  Patient seen and examined at the bedside this morning.  Hemodynamically stable.  Comfortable during my evaluation, eating her  breakfast.  Denies any shortness of breath.  Still has bilateral basal crackles.  She is alert and oriented .  Objective: Vitals:   02/14/20 2105 02/15/20 0019 02/15/20 0500 02/15/20 0514  BP: 136/71 (!) 122/99  137/86  Pulse: 72   73  Resp: 16   16  Temp: 99.6 F (37.6 C)   99.1 F (37.3 C)  TempSrc: Oral   Oral  SpO2: 96%   97%  Weight:   62.9 kg   Height:        Intake/Output Summary (Last 24 hours) at 02/15/2020 0810 Last data filed at 02/15/2020 0300 Gross per 24 hour  Intake 480 ml  Output 1400 ml  Net -920 ml   Filed Weights   02/13/20 0558 02/14/20 0541 02/15/20 0500  Weight: 63.6 kg 64.8 kg 62.9 kg    Examination:   General exam: Deconditioned, debilitated elderly female Respiratory system: Bilateral basal crackles  cardiovascular system: S1 & S2 heard, RRR. No JVD, murmurs, rubs, gallops or clicks. Gastrointestinal system: Abdomen is nondistended, soft and nontender. No organomegaly or masses felt. Normal bowel sounds heard. Central nervous system: Alert and oriented  Extremities: No edema, no clubbing ,no cyanosis,splints on both bilateral LEs Skin: No rashes, lesions or ulcers,no icterus ,no pallor   Data Reviewed: I have personally reviewed following labs and imaging studies  CBC: Recent Labs  Lab 02/11/20 1646  WBC 11.4*  HGB 13.7  HCT 45.2  MCV 97.4  PLT 638   Basic Metabolic Panel: Recent Labs  Lab 02/11/20 1646 02/12/20 0422 02/13/20 0541 02/14/20 0532 02/15/20 0645  NA 131* 134* 135 135 136  K 4.6 4.8 5.0 5.0 4.1  CL 93* 95* 96* 92* 88*  CO2 28 32 32 34* 39*  GLUCOSE 121* 122* 129* 89 103*  BUN 28* 28* 25* 23 19  CREATININE 0.86 0.83 0.71 0.70 0.72  CALCIUM 9.0 8.5* 8.6* 8.6* 8.8*   GFR: Estimated Creatinine Clearance: 41.5 mL/min (by C-G formula based on SCr of 0.72 mg/dL). Liver Function Tests: Recent Labs  Lab 02/11/20 1646  AST 30  ALT 37  ALKPHOS 97  BILITOT 0.5  PROT 8.0  ALBUMIN 3.4*   No results for input(s):  LIPASE, AMYLASE in the last 168 hours. No results for input(s): AMMONIA in the last 168 hours. Coagulation Profile: Recent Labs  Lab 02/11/20 1646  INR 1.1   Cardiac Enzymes: No results for input(s): CKTOTAL, CKMB, CKMBINDEX, TROPONINI in the last 168 hours. BNP (last 3 results) No results for input(s): PROBNP in the last 8760 hours. HbA1C: No results for input(s): HGBA1C in the last 72 hours. CBG: No results for input(s): GLUCAP in the last 168 hours. Lipid Profile: No results for input(s): CHOL, HDL, LDLCALC, TRIG, CHOLHDL, LDLDIRECT in the last 72 hours. Thyroid Function Tests: No results for input(s): TSH, T4TOTAL, FREET4, T3FREE, THYROIDAB in the last 72 hours. Anemia Panel: No results for input(s): VITAMINB12, FOLATE, FERRITIN, TIBC, IRON, RETICCTPCT in the last 72 hours. Sepsis Labs: No results for input(s): PROCALCITON, LATICACIDVEN in the last 168 hours.  Recent Results (from the past 240 hour(s))  Resp Panel by RT PCR (RSV, Flu A&B, Covid) - Nasopharyngeal Swab     Status: None   Collection Time: 02/11/20  7:47 PM  Specimen: Nasopharyngeal Swab  Result Value Ref Range Status   SARS Coronavirus 2 by RT PCR NEGATIVE NEGATIVE Final    Comment: (NOTE) SARS-CoV-2 target nucleic acids are NOT DETECTED.  The SARS-CoV-2 RNA is generally detectable in upper respiratoy specimens during the acute phase of infection. The lowest concentration of SARS-CoV-2 viral copies this assay can detect is 131 copies/mL. A negative result does not preclude SARS-Cov-2 infection and should not be used as the sole basis for treatment or other patient management decisions. A negative result may occur with  improper specimen collection/handling, submission of specimen other than nasopharyngeal swab, presence of viral mutation(s) within the areas targeted by this assay, and inadequate number of viral copies (<131 copies/mL). A negative result must be combined with clinical observations,  patient history, and epidemiological information. The expected result is Negative.  Fact Sheet for Patients:  PinkCheek.be  Fact Sheet for Healthcare Providers:  GravelBags.it  This test is no t yet approved or cleared by the [place] FDA and  has been authorized for detection and/or diagnosis of SARS-CoV-2 by FDA under an Emergency Use Authorization (EUA). This EUA will remain  in effect (meaning this test can be used) for the duration of the COVID-19 declaration under Section 564(b)(1) of the Act, 21 U.S.C. section 360bbb-3(b)(1), unless the authorization is terminated or revoked sooner.     Influenza A by PCR NEGATIVE NEGATIVE Final   Influenza B by PCR NEGATIVE NEGATIVE Final    Comment: (NOTE) The Xpert Xpress SARS-CoV-2/FLU/RSV assay is intended as an aid in  the diagnosis of influenza from Nasopharyngeal swab specimens and  should not be used as a sole basis for treatment. Nasal washings and  aspirates are unacceptable for Xpert Xpress SARS-CoV-2/FLU/RSV  testing.  Fact Sheet for Patients: PinkCheek.be  Fact Sheet for Healthcare Providers: GravelBags.it  This test is not yet approved or cleared by the [place] FDA and  has been authorized for detection and/or diagnosis of SARS-CoV-2 by  FDA under an Emergency Use Authorization (EUA). This EUA will remain  in effect (meaning this test can be used) for the duration of the  Covid-19 declaration under Section 564(b)(1) of the Act, 21  U.S.C. section 360bbb-3(b)(1), unless the authorization is  terminated or revoked.    Respiratory Syncytial Virus by PCR NEGATIVE NEGATIVE Final    Comment: (NOTE) Fact Sheet for Patients: PinkCheek.be  Fact Sheet for Healthcare Providers: GravelBags.it  This test is not yet approved or cleared by the  [place] FDA and  has been authorized for detection and/or diagnosis of SARS-CoV-2 by  FDA under an Emergency Use Authorization (EUA). This EUA will remain  in effect (meaning this test can be used) for the duration of the  COVID-19 declaration under Section 564(b)(1) of the Act, 21 U.S.C.  section 360bbb-3(b)(1), unless the authorization is terminated or  revoked. Performed at [facility], [place] [address] [place], [place] [address]          Radiology Studies: CT FOOT RIGHT WO CONTRAST  Result Date: 02/13/2020 CLINICAL DATA:  Right foot fracture. EXAM: CT OF THE RIGHT FOOT WITHOUT CONTRAST TECHNIQUE: Multidetector CT imaging of the right foot was performed according to the standard protocol. Multiplanar CT image reconstructions were also generated. COMPARISON:  Radiographs 02/12/2020 and 01/03/2012 FINDINGS: Bones/Joint/Cartilage The bones appear severely demineralized. As seen on the prior radiographs, there is a mildly displaced avulsion fracture of the superior aspect of the calcaneal tuberosity. There is no extension of this fracture  into the subtalar joint which appears intact. No other evidence of acute fracture or dislocation. There are mild midfoot and 1st MTP joint degenerative changes. Type 2 accessory navicular noted. Ligaments Suboptimally assessed by CT. Muscles and Tendons The Achilles tendon appears intact and inserts on the calcaneal avulsion fracture. The additional ankle tendons appear intact. Generalized atrophy throughout the foot musculature. Soft tissues No focal hematoma, soft tissue emphysema or foreign body. Minimal atherosclerosis noted. IMPRESSION: 1. Mildly displaced avulsion fracture of the superior aspect of the calcaneal tuberosity. No extension of this fracture into the subtalar joint which appears intact. Given underlying osteopenia, this is likely an insufficiency fracture. 2. No other evidence of acute fracture or dislocation. 3. Intact  Achilles tendon. Electronically Signed   By: Richardean Sale M.D.   On: 02/13/2020 19:31        Scheduled Meds: . amLODipine  5 mg Oral Daily  . aspirin EC  81 mg Oral Daily  . carvedilol  12.5 mg Oral BID WC  . docusate sodium  100 mg Oral Daily  . DULoxetine  60 mg Oral Daily  . enoxaparin (LOVENOX) injection  40 mg Subcutaneous Q24H  . latanoprost  1 drop Both Eyes QHS  . levothyroxine  112 mcg Oral Q0600  . melatonin  10 mg Oral QHS  . mometasone-formoterol  2 puff Inhalation BID  . polyethylene glycol  17 g Oral Daily  . sodium chloride flush  3 mL Intravenous Q12H   Continuous Infusions: . sodium chloride       LOS: 4 days    Time spent: 25 mins,More than 50% of that time was spent in counseling and/or coordination of care.      Shelly Coss, MD [facility] P11/19/2021, 8:10 AM

## 2020-02-15 NOTE — TOC Progression Note (Addendum)
Transition of Care [facility]) - Progression Note    Patient Details  Name: [name] MRN: [id number] Date of Birth: [birth year]  Transition of Care [facility]) CM/SW Contact  Amera Banos, Juliann Pulse, RN Phone Number: 02/15/2020, 1:55 PM  Clinical Narrative: Several TCto dtr Dawnbut tel# just cuts off-left bed offers in rm-await choice.   1. 1.7 mi [place] at [place], [place] [address] [phone] Overall rating Average 2. 2 mi [facility] at the [place], [place] [address] [phone] Overall rating Below average 3. 2.1 mi  [place] at [place], [place] [address] [phone] Overall rating Much below average 4. 2.2 mi [facility] [employer] [place], [place] [address] [phone] Overall rating Much above average 5. 2.2 mi [facility] [place], [place] [address] [phone] Overall rating Much below average 6. 2.7 [place], [place] [address] [phone] Overall rating Much above average 7. 3.2 mi [place] [address] [place], [place] [address] [phone] Overall rating Average 8. 3.3 mi [facility] [address] [place], [place] [address] [phone] Overall rating Much below average 9. 3.5 mi [facility] [place], [place] [address] [phone] Overall rating Below average 10. 4.3 [facility], [place] [address] [phone] Overall rating Below average 11. 4.7 mi [facility] at [place], [place] [address] [phone] Overall rating Much above average 12. 5.2 mi [facility] [address] [place], [place] 11/15/2021 [phone] Overall rating Much above average 13. 6.2 mi [place], [place] [address] [phone] Overall rating Average 14. 8.1 [place], [place] [address] [phone] Overall rating Above average 15. 8.1 mi [facility] and [place], [place] [address] [phone] Overall rating Much below average 16. 9.7 mi [facility] [address] [place], [place] [address] [phone] Overall rating Average 17. 10 mi [facility] [address] [place], [place] [address] [phone] Overall rating Much above average 18. 11.4 mi [place] at [facility] [address] [place], [place] [address] [phone] Overall rating Much above average 19. 13.5 [facility] [address] [place], [place] [address] [phone] Overall rating Much below average 20. 13.5 mi [facility] [address] [place], [place] [address] [phone] Overall rating Much below average 21. 14.5 [place] [address] [place], [place] [address] [phone] Overall rating Much below average 22. 14.9 mi [facility] [address] [place], [place] [address] [phone] Overall rating Much below average 23. 15.1 mi [facility] at [place], [place] [address] [phone] Overall rating Much below average 24. 15.3 mi [facility] [place], [place] [address] [phone] Overall rating Below average 25. [place], [place] [address] [phone] Overall rating Much above average 26. 16.2 [place] [address] [place],  [address] [phone]  Overall rating Much above average 27. 16.6 mi [place] [name] [place], [place] [address] [phone] Overall rating Below average 28. 17.2 mi [facility] [place], [place] [address] [phone] Overall rating Average 29. 51.7 [facility] [address] [place], [place] [address] [phone] Overall rating Much below average 30. 19.4 mi [address] at [employer] at [facility], [place] [address] [phone] Overall rating Much above average 31. 20.6 mi [place] [address] [place], [place] [address] [phone] Overall rating Much below average 32. 20.7 [place] and [facility] [place], [place] [address] [phone] Overall rating Below average 33. 42.3 [facility] [address] [place], [place] [address] [phone] Overall rating Below average 34. 21.1 [place], [place] [address] [phone] Overall rating Much above average 35. [place] [address] [place], [place] [address] [phone] Overall rating Below average 36. 22.4 mi [address] [place], [place] [address] [phone] Overall rating Average 37. 22.7 mi [facility] [address] [place], [place] [address] [phone] Overall rating Above average 38. 22.8 [address] [place], [place] [address] [phone] Overall rating Much above average 39. 22.9 [place], [place] [address] [phone] Overall rating Much below average 40. [place], [place] [address] [phone] Overall rating Much below average 41. 24.4 mi [facility] [address] [place], [place] [address] [phone] Overall rating Below average 42. 24.6 [facility] [address] [place], [place] [address] [phone] Overall rating Much below average 43. 24.6 mi [place], [place] [address] [phone] Overall rating Below average 44. 24.9 mi [place] [place],  [address] [phone] Overall rating Below average To explore and download nursing home data,visit the data catalog on CMS.gov    Expected Discharge Plan: [place] Barriers to Discharge: Continued Medical Work up, SNF Pending bed offer  Expected Discharge Plan and Services Expected Discharge Plan: [place]   Discharge Planning Services: CM Consult Post Acute Care Choice: [place] Living arrangements for the past 2 months: Single Family Home                                       Social Determinants of Health (SDOH) Interventions    Readmission Risk Interventions No flowsheet data found.

## 2020-02-15 NOTE — Progress Notes (Signed)
Physical Therapy Reevaluation  Patient Details Name: [name] MRN: [id number] DOB: 12-08-1928 Today's Date: 02/15/2020    History of Present Illness Pt is 86 yo female with hx including CHF, COPD, hypothyroidism, and AICD placement.  She presents s/p several falls at home.  She was found to have trimalleolar unstable ankle fx that is in splint and per ortho NWB with f/u in office for further tx options (no surgery at this time).  During initial PT evaluation, pt had c/o R heel pain, MD was notified and pt found to have calcaneal avulsion fx.  Ortho consulted with recommendations for non-surgical, NWB, splint management of R LE also.    PT Comments    Since initial PT evaluation pt with newly identified R calcaneal avulsion fx and now is NWB on bil LE.  Goals were adjusted to reflect inability to stand.  Despite inability to WB on either leg, pt demonstrates good potential to progress bed mobility and anterior/posterior and lateral scoot transfers to wheelchairs/recliners/or BSC.  Pt with excellent participation today requiring min-mod A of 2 for anterior posterior transfer with frequent multimodal cues for technique and relaxation.  Continue to express high fear of falling. Continue to recommend further rehab as pt has good potential to progress transfers.       Follow Up Recommendations  SNF     Equipment Recommendations  Wheelchair cushion (measurements PT);Wheelchair (measurements PT);3in1 (PT) (both with drop arms)    Recommendations for Other Services       Precautions / Restrictions Precautions Precautions: Fall Precaution Comments: NWB on BLE Required Braces or Orthoses: Splint/Cast Splint/Cast: Splint on L and R LE Restrictions Weight Bearing Restrictions: Yes RLE Weight Bearing: Non weight bearing LLE Weight Bearing: Non weight bearing    Mobility  Bed Mobility Overal bed mobility: Needs Assistance Bed Mobility: Sit to Supine;Sidelying to Sit   Sidelying to sit:  Min assist;+2 for safety/equipment   Sit to supine: Min assist   General bed mobility comments: Increased time and cues for sequencing.  To sit used log roll technique with use of rails and min A for legs. For return to supine pt was in long sitting position from A/P transfer so just lifted [facility]  Transfers Overall transfer level: Needs assistance   Transfers: Comptroller transfers: +2 safety/equipment;Min assist;Mod assist   General transfer comment: Performed posterior scoot transfer to [facility] with frequent cues and encouragement.  Pt requiring min A of 2 for legs and assist with weight shifting.  For anterior scoot back to bed required mod A of 2 when transitioning from bsc to bed and to spin once on bed using bed pad.  Cues to not pull with legs.  Ambulation/Gait                 Stairs             Wheelchair Mobility    Modified Rankin (Stroke Patients Only)       Balance Overall balance assessment: Needs assistance Sitting-balance support: No upper extremity supported Sitting balance-Leahy Scale: Good         Standing balance comment: NWB BIL LE                            Cognition Arousal/Alertness: Awake/alert Behavior During Therapy: Anxious Overall Cognitive Status: No family/caregiver present to determine baseline cognitive functioning Area of Impairment: Attention;Following commands;Problem solving  Current Attention Level: Sustained   Following Commands: Follows one step commands consistently     Problem Solving: Slow processing;Requires tactile cues;Requires verbal cues;Difficulty sequencing General Comments: Pt with significant anxiousness in regards to movement - required repeated and multimodal cues for transfer techniques and relaxation.  Pt with high fear of falling.      Exercises      General Comments General comments (skin integrity, edema, etc.):  Increased time on BSC - pt with 2 small pellets of stool but was unable to do more, states "i feel more, do they do enemas?"  - Notified RN      Pertinent Vitals/Pain Pain Assessment: Faces Faces Pain Scale: Hurts a little bit Pain Location: Back Pain Descriptors / Indicators: Discomfort;Guarding Pain Intervention(s): Limited activity within patient's tolerance;Monitored during session;Repositioned;Relaxation;Other (comment);Premedicated before session (followed back precautions for comfort)    Home Living                      Prior Function            PT Goals (current goals can now be found in the care plan section) Acute Rehab PT Goals Patient Stated Goal: to eventually go home PT Goal Formulation: With patient Time For Goal Achievement: 02/29/20 Potential to Achieve Goals: Good Progress towards PT goals: Goals downgraded-see care plan (due to newly identified injury)    Frequency    Min 3X/week      PT Plan Current plan remains appropriate    Co-evaluation              AM-PAC PT "6 Clicks" Mobility   Outcome Measure  Help needed turning from your back to your side while in a flat bed without using bedrails?: A Lot Help needed moving from lying on your back to sitting on the side of a flat bed without using bedrails?: A Lot Help needed moving to and from a bed to a chair (including a wheelchair)?: A Lot Help needed standing up from a chair using your arms (e.g., wheelchair or bedside chair)?: Total Help needed to walk in hospital room?: Total Help needed climbing 3-5 steps with a railing? : Total 6 Click Score: 9    End of Session Equipment Utilized During Treatment: Gait belt Activity Tolerance: Patient tolerated treatment well Patient left: in bed;with bed alarm set;with call bell/phone within reach Nurse Communication: Mobility status PT Visit Diagnosis: Unsteadiness on feet (R26.81);Repeated falls (R29.6);Muscle weakness (generalized)  (M62.81);Pain     Time: 3009-2330 PT Time Calculation (min) (ACUTE ONLY): 35 min  Charges:  $Therapeutic Activity: 8-22 mins And Milbert Coulter, PT Acute Rehab Services Pager [phone] [facility] [phone]     Karlton Lemon 02/15/2020, 1:02 PM

## 2020-02-16 DIAGNOSIS — I5033 Acute on chronic diastolic (congestive) heart failure: Secondary | ICD-10-CM | POA: Diagnosis not present

## 2020-02-16 LAB — BASIC METABOLIC PANEL
Anion gap: 10 (ref 5–15)
BUN: 19 mg/dL (ref 8–23)
CO2: 38 mmol/L — ABNORMAL HIGH (ref 22–32)
Calcium: 8.8 mg/dL — ABNORMAL LOW (ref 8.9–10.3)
Chloride: 87 mmol/L — ABNORMAL LOW (ref 98–111)
Creatinine, Ser: 0.58 mg/dL (ref 0.44–1.00)
GFR, Estimated: >60 mL/min (ref >60)
Glucose, Bld: 103 mg/dL — ABNORMAL HIGH (ref 70–99)
Potassium: 4.6 mmol/L (ref 3.5–5.1)
Sodium: 135 mmol/L (ref 135–145)

## 2020-02-16 MED ORDER — SENNOSIDES-DOCUSATE SODIUM 8.6-50 MG PO TABS
1.0000 | ORAL_TABLET | Freq: Two times a day (BID) | ORAL | Status: DC
  Administered 2020-02-16 – 2020-02-18 (×4): 1 via ORAL
  Filled 2020-02-16 (×4): qty 1

## 2020-02-16 NOTE — TOC Progression Note (Signed)
Transition of Care [facility]) - Progression Note    Patient Details  Name: [name] MRN: [id number] Date of Birth: [birth year]  Transition of Care [facility]) CM/SW [place], [place] Phone Number: 02/16/2020, 12:34 PM  Clinical Narrative:   Per RN request, spoke with daughter about bed choices.  [facility] wants [facility].  I explained why that was not an option.  She does not like any of the other bed offers.  I explained our process.  She stated [facility] in [place] would be convenient as she lives in [place], but does not want to select a facility without talking with someone at facility first. I tried reaching out to [place] in admissions, and called the facility number itself to ask for help.  No answer from either. TOC will continue to follow during the course of hospitalization.     Expected Discharge Plan: [place] Barriers to Discharge: Continued Medical Work up, SNF Pending bed offer  Expected Discharge Plan and Services Expected Discharge Plan: [place]   Discharge Planning Services: CM Consult Post Acute Care Choice: [place] Living arrangements for the past 2 months: Single Family Home                                       Social Determinants of Health (SDOH) Interventions    Readmission Risk Interventions No flowsheet data found.

## 2020-02-16 NOTE — Progress Notes (Signed)
PROGRESS NOTE    [name]  LDJ:[id number] DOB: 1928/12/19 DOA: 02/11/2020 PCP: Carolee Rota, NP   Chief Complain: Shortness of breath, fall  Brief Narrative: Patient is an 86 year old female with history of congestive heart failure, COPD, hypothyroidism, status post AICD who presented to the emergency department with complaint of fall at home.  Also reported increasing shortness of breath that worsened with exertion.  On presentation she was found to have elevated BNP, chest x-ray showed mild interstitial edema.  She was requiring 2 L of oxygen for maintenance of her saturation.  X-rays showed  left ankle showed fracture and avulsion type fracture involving the posterosuperior aspect of the calcaneus .  Orthopedics consulted who recommended nonoperative management and outpatient follow-up and not weightbearing on both lower extremities.  PT/OT recommended skilled nursing  facility on discharge. Patient is medically stable for discharge to skilled nursing facility as soon as bed is available.  Assessment & Plan:   Principal Problem:   Acute on chronic heart failure with preserved ejection fraction (HFpEF) (HCC) Active Problems:   COPD (chronic obstructive pulmonary disease) (HCC)   ICD (implantable cardioverter-defibrillator) battery depletion   Essential hypertension   Fracture of left ankle, closed, initial encounter   Fall at home, initial encounter   Prolonged QT interval   Fall/left ankle fracture/right calcaneal fracture: Splint placed in the emergency department on the left .Xray showed Minimally comminuted, mildly displaced trimalleolar fracture of the ankle and also avulsion type fracture involving the posterosuperior aspect of the calcaneus. Orthopedics consulted who recommended nonoperative management and outpatient follow-up,nonweight bearing status.  PT/OT recommended skilled nursing  facility on discharge.  Acute on chronic hypoxic respiratory failure: Requiring 2 L  of oxygen per minute.  Hypoxia secondary to congestive heart failure/COPD.  Patient was on oxygen at home in the past  but not now.  She has bilateral basal crackles.  Chest x-ray showed chronic changes.  Continue supplemental oxygen  Dyspnea/acute on chronic diastolic congestive heart failure: Elevated BNP on presentation.  She is complaining of dyspnea on exertion.  Echocardiogram .  On beta-blockers.    Continue to monitor daily weight, input/output. Echocardiogram showed left ventricular ejection fraction of 70 to 75%,inderterminate  left ventricular diastolic parameters.On Lasix 40 mg daily at home. She was started on high-dose Lasix on admission.  She doesnot have any lower extremity edema.  S/P few doses  of lasix IV.She has H/O nonischemic cardiomyopathy and is status post ICD placement several years ago.  Restarted home dose Lasix.  COPD: Currently stable.  Continue as needed bronchodilators, home inhalers  Hypertension: On Norvasc and Coreg at home which have been  resumed.  Continue to monitor blood pressure.  Hypothyroidism: Continue Synthyroid  Hyperlipidemia: Continue statin  Dementia: Intermittently confused at baseline.  Continue supportive care.  Delirium precautions.  Currently oriented          DVT prophylaxis:Lovenox Code Status: Full Family Communication: Called and discussed with daughter on phone 01/12/20. Status is: Inpatient  Remains inpatient appropriate because:Inpatient level of care appropriate due to severity of illness   Dispo: The patient is from: Home              Anticipated d/c is to: SNF              Anticipated d/c date is: As soon as bed is available              Patient currently is not medically stable to d/c.  Consultants: orthopedics  Procedures:None  Antimicrobials:  Anti-infectives (From admission, onward)   None      Subjective:  Patient seen and examined the bedside this morning.  Comfortable.  No active issues.   Eating her breakfast Objective: Vitals:   02/15/20 2002 02/15/20 2201 02/16/20 0402 02/16/20 0500  BP:  134/68 (!) 147/76   Pulse:  68 71   Resp:  16 16   Temp:  97.9 F (36.6 C) 98.8 F (37.1 C)   TempSrc:  Oral Oral   SpO2: 96% 96% 95%   Weight:    62.9 kg  Height:        Intake/Output Summary (Last 24 hours) at 02/16/2020 0805 Last data filed at 02/15/2020 1332 Gross per 24 hour  Intake --  Output 700 ml  Net -700 ml   Filed Weights   02/14/20 0541 02/15/20 0500 02/16/20 0500  Weight: 64.8 kg 62.9 kg 62.9 kg    Examination:   General exam: Pleasant elderly female, comfortable HEENT:PERRL,Oral mucosa moist, Ear/Nose normal on gross exam Respiratory system: Bilateral diminished air entry on the bases,basal crackles Cardiovascular system: S1 & S2 heard, RRR. No JVD, murmurs, rubs, gallops or clicks. Gastrointestinal system: Abdomen is nondistended, soft and nontender. No organomegaly or masses felt. Normal bowel sounds heard. Central nervous system: Alert and oriented. No focal neurological deficits. Extremities: No edema, no clubbing ,no cyanosis Skin: No rashes, lesions or ulcers,no icterus ,no pallor  Data Reviewed: I have personally reviewed following labs and imaging studies  CBC: Recent Labs  Lab 02/11/20 1646  WBC 11.4*  HGB 13.7  HCT 45.2  MCV 97.4  PLT 242   Basic Metabolic Panel: Recent Labs  Lab 02/12/20 0422 02/13/20 0541 02/14/20 0532 02/15/20 0645 02/16/20 0626  NA 134* 135 135 136 135  K 4.8 5.0 5.0 4.1 4.6  CL 95* 96* 92* 88* 87*  CO2 32 32 34* 39* 38*  GLUCOSE 122* 129* 89 103* 103*  BUN 28* 25* 23 19 19   CREATININE 0.83 0.71 0.70 0.72 0.58  CALCIUM 8.5* 8.6* 8.6* 8.8* 8.8*   GFR: Estimated Creatinine Clearance: 41.5 mL/min (by C-G formula based on SCr of 0.58 mg/dL). Liver Function Tests: Recent Labs  Lab 02/11/20 1646  AST 30  ALT 37  ALKPHOS 97  BILITOT 0.5  PROT 8.0  ALBUMIN 3.4*   No results for input(s): LIPASE,  AMYLASE in the last 168 hours. No results for input(s): AMMONIA in the last 168 hours. Coagulation Profile: Recent Labs  Lab 02/11/20 1646  INR 1.1   Cardiac Enzymes: No results for input(s): CKTOTAL, CKMB, CKMBINDEX, TROPONINI in the last 168 hours. BNP (last 3 results) No results for input(s): PROBNP in the last 8760 hours. HbA1C: No results for input(s): HGBA1C in the last 72 hours. CBG: No results for input(s): GLUCAP in the last 168 hours. Lipid Profile: No results for input(s): CHOL, HDL, LDLCALC, TRIG, CHOLHDL, LDLDIRECT in the last 72 hours. Thyroid Function Tests: No results for input(s): TSH, T4TOTAL, FREET4, T3FREE, THYROIDAB in the last 72 hours. Anemia Panel: No results for input(s): VITAMINB12, FOLATE, FERRITIN, TIBC, IRON, RETICCTPCT in the last 72 hours. Sepsis Labs: No results for input(s): PROCALCITON, LATICACIDVEN in the last 168 hours.  Recent Results (from the past 240 hour(s))  Resp Panel by RT PCR (RSV, Flu A&B, Covid) - Nasopharyngeal Swab     Status: None   Collection Time: 02/11/20  7:47 PM   Specimen: Nasopharyngeal Swab  Result Value Ref Range Status  SARS Coronavirus 2 by RT PCR NEGATIVE NEGATIVE Final    Comment: (NOTE) SARS-CoV-2 target nucleic acids are NOT DETECTED.  The SARS-CoV-2 RNA is generally detectable in upper respiratoy specimens during the acute phase of infection. The lowest concentration of SARS-CoV-2 viral copies this assay can detect is 131 copies/mL. A negative result does not preclude SARS-Cov-2 infection and should not be used as the sole basis for treatment or other patient management decisions. A negative result may occur with  improper specimen collection/handling, submission of specimen other than nasopharyngeal swab, presence of viral mutation(s) within the areas targeted by this assay, and inadequate number of viral copies (<131 copies/mL). A negative result must be combined with clinical observations, patient  history, and epidemiological information. The expected result is Negative.  Fact Sheet for Patients:  PinkCheek.be  Fact Sheet for Healthcare Providers:  GravelBags.it  This test is no t yet approved or cleared by the [place] FDA and  has been authorized for detection and/or diagnosis of SARS-CoV-2 by FDA under an Emergency Use Authorization (EUA). This EUA will remain  in effect (meaning this test can be used) for the duration of the COVID-19 declaration under Section 564(b)(1) of the Act, 21 U.S.C. section 360bbb-3(b)(1), unless the authorization is terminated or revoked sooner.     Influenza A by PCR NEGATIVE NEGATIVE Final   Influenza B by PCR NEGATIVE NEGATIVE Final    Comment: (NOTE) The Xpert Xpress SARS-CoV-2/FLU/RSV assay is intended as an aid in  the diagnosis of influenza from Nasopharyngeal swab specimens and  should not be used as a sole basis for treatment. Nasal washings and  aspirates are unacceptable for Xpert Xpress SARS-CoV-2/FLU/RSV  testing.  Fact Sheet for Patients: PinkCheek.be  Fact Sheet for Healthcare Providers: GravelBags.it  This test is not yet approved or cleared by the [place] FDA and  has been authorized for detection and/or diagnosis of SARS-CoV-2 by  FDA under an Emergency Use Authorization (EUA). This EUA will remain  in effect (meaning this test can be used) for the duration of the  Covid-19 declaration under Section 564(b)(1) of the Act, 21  U.S.C. section 360bbb-3(b)(1), unless the authorization is  terminated or revoked.    Respiratory Syncytial Virus by PCR NEGATIVE NEGATIVE Final    Comment: (NOTE) Fact Sheet for Patients: PinkCheek.be  Fact Sheet for Healthcare Providers: GravelBags.it  This test is not yet approved or cleared by the [place]  FDA and  has been authorized for detection and/or diagnosis of SARS-CoV-2 by  FDA under an Emergency Use Authorization (EUA). This EUA will remain  in effect (meaning this test can be used) for the duration of the  COVID-19 declaration under Section 564(b)(1) of the Act, 21 U.S.C.  section 360bbb-3(b)(1), unless the authorization is terminated or  revoked. Performed at [facility], [place] [address], [place], [place] [address]          Radiology Studies: DG CHEST PORT 1 VIEW  Result Date: 02/15/2020 CLINICAL DATA:  Shortness of breath EXAM: PORTABLE CHEST 1 VIEW COMPARISON:  02/11/2020 and earlier FINDINGS: Biventricular pacer from the left. Mild cardiomegaly. Stable mediastinal contours including hilar prominence. Chronic interstitial reticulation. There is no edema, consolidation, effusion, or pneumothorax. IMPRESSION: Chronic lung disease and cardiomegaly. No acute finding when compared to priors. Electronically Signed   By: Monte Fantasia M.D.   On: 02/15/2020 12:08        Scheduled Meds: . amLODipine  5 mg Oral Daily  . aspirin EC  81 mg Oral Daily  . carvedilol  12.5 mg Oral BID WC  . docusate sodium  100 mg Oral Daily  . DULoxetine  60 mg Oral Daily  . enoxaparin (LOVENOX) injection  40 mg Subcutaneous Q24H  . latanoprost  1 drop Both Eyes QHS  . levothyroxine  112 mcg Oral Q0600  . melatonin  10 mg Oral QHS  . mometasone-formoterol  2 puff Inhalation BID  . polyethylene glycol  17 g Oral Daily  . sodium chloride flush  3 mL Intravenous Q12H   Continuous Infusions: . sodium chloride       LOS: 5 days    Time spent: 15 mins,More than 50% of that time was spent in counseling and/or coordination of care.      Shelly Coss, MD [facility] P11/20/2021, 8:05 AM

## 2020-02-17 DIAGNOSIS — I5033 Acute on chronic diastolic (congestive) heart failure: Secondary | ICD-10-CM | POA: Diagnosis not present

## 2020-02-17 LAB — RESP PANEL BY RT-PCR (FLU A&B, COVID) ARPGX2
Influenza A by PCR: NEGATIVE
Influenza B by PCR: NEGATIVE
SARS Coronavirus 2 by RT PCR: NEGATIVE

## 2020-02-17 MED ORDER — SALINE SPRAY 0.65 % NA SOLN
1.0000 | NASAL | Status: DC | PRN
  Administered 2020-02-17 (×2): 1 via NASAL
  Filled 2020-02-17: qty 44

## 2020-02-17 MED ORDER — FUROSEMIDE 40 MG PO TABS
40.0000 mg | ORAL_TABLET | Freq: Every day | ORAL | Status: DC
  Administered 2020-02-17 – 2020-02-18 (×2): 40 mg via ORAL
  Filled 2020-02-17 (×2): qty 1

## 2020-02-17 MED ORDER — DORZOLAMIDE HCL-TIMOLOL MAL 2-0.5 % OP SOLN
1.0000 [drp] | Freq: Two times a day (BID) | OPHTHALMIC | Status: DC
  Administered 2020-02-17 – 2020-02-18 (×2): 1 [drp] via OPHTHALMIC

## 2020-02-17 MED ORDER — OXYCODONE HCL 15 MG PO TABS: 15.0000 mg | ORAL_TABLET | Freq: Four times a day (QID) | ORAL | 0 refills | Status: DC | PRN

## 2020-02-17 NOTE — Progress Notes (Signed)
Patient daughter inquiring about patients home medications. Per chart on admission no home medications were present and there is no form in the patients paper chart indicating medications were taken to the Pharmacy to be stored. Pharmacy called to verify, and there are no home medications for the patient in the vault. Called the ED to check lost and found and no medications were found.  Provided the [facility] number to call in the morning to see if they have record of the medications.

## 2020-02-17 NOTE — Progress Notes (Signed)
PROGRESS NOTE    [name]  YBO:[id number] DOB: 08/08/1928 DOA: 02/11/2020 PCP: Carolee Rota, NP   Chief Complain: Shortness of breath, fall  Brief Narrative: Patient is an 86 year old female with history of congestive heart failure, COPD, hypothyroidism, status post AICD who presented to the emergency department with complaint of fall at home.  Also reported increasing shortness of breath that worsened with exertion.  On presentation she was found to have elevated BNP, chest x-ray showed mild interstitial edema.  She was requiring 2 L of oxygen for maintenance of her saturation.  X-rays showed  left ankle showed fracture and avulsion type fracture involving the posterosuperior aspect of the calcaneus .  Orthopedics consulted who recommended nonoperative management and outpatient follow-up and not weightbearing on both lower extremities.  PT/OT recommended skilled nursing  facility on discharge. Patient is medically stable for discharge to skilled nursing facility as soon as bed is available.  Assessment & Plan:   Principal Problem:   Acute on chronic heart failure with preserved ejection fraction (HFpEF) (HCC) Active Problems:   COPD (chronic obstructive pulmonary disease) (HCC)   ICD (implantable cardioverter-defibrillator) battery depletion   Essential hypertension   Fracture of left ankle, closed, initial encounter   Fall at home, initial encounter   Prolonged QT interval   Fall/left ankle fracture/right calcaneal fracture: Splint placed in the emergency department on the left .Xray showed Minimally comminuted, mildly displaced trimalleolar fracture of the ankle and also avulsion type fracture involving the posterosuperior aspect of the calcaneus. Orthopedics consulted who recommended nonoperative management and outpatient follow-up,nonweight bearing status.  PT/OT recommended skilled nursing  facility on discharge.  Acute on chronic hypoxic respiratory failure: Requiring 2 L  of oxygen per minute.  Hypoxia secondary to congestive heart failure/COPD.  Patient was on oxygen at home in the past  but not now.  She has bilateral basal crackles.  Chest x-ray showed chronic changes.  Continue supplemental oxygen  Dyspnea/acute on chronic diastolic congestive heart failure: Elevated BNP on presentation.  She is complaining of dyspnea on exertion.  Echocardiogram .  On beta-blockers.    Continue to monitor daily weight, input/output. Echocardiogram showed left ventricular ejection fraction of 70 to 75%,inderterminate  left ventricular diastolic parameters.On Lasix 40 mg daily at home. She was started on high-dose Lasix on admission.  She doesnot have any lower extremity edema.  S/P few doses  of lasix IV.She has H/O nonischemic cardiomyopathy and is status post ICD placement several years ago.  Restarted home dose Lasix.  COPD: Currently stable.  Continue as needed bronchodilators, home inhalers  Hypertension: On Norvasc and Coreg at home which have been  resumed.  Continue to monitor blood pressure.  Hypothyroidism: Continue Synthyroid  Hyperlipidemia: Continue statin  Dementia: Intermittently confused at baseline.  Continue supportive care.  Delirium precautions.  Currently alert and oriented          DVT prophylaxis:Lovenox Code Status: Full Family Communication: Called and discussed with daughter on phone 02/17/20. Status is: Inpatient  Remains inpatient appropriate because:Inpatient level of care appropriate due to severity of illness   Dispo: The patient is from: Home              Anticipated d/c is to: SNF              Anticipated d/c date is: As soon as bed is available              Patient currently is not medically stable to d/c. Daughter hasnot  picked facility yet    Consultants: orthopedics  Procedures:None  Antimicrobials:  Anti-infectives (From admission, onward)   None      Subjective:  Patient seen and examined at the bedside this  morning.  Currently stable.  Comfortable.  No acute issues waiting for placement  Objective: Vitals:   02/16/20 2117 02/17/20 0500 02/17/20 0505 02/17/20 0744  BP: (!) 150/68  (!) 155/69   Pulse: 67  67   Resp: 18  18   Temp: 98.4 F (36.9 C)  98.5 F (36.9 C)   TempSrc: Oral     SpO2: 91%  94% 94%  Weight:  64.1 kg    Height:        Intake/Output Summary (Last 24 hours) at 02/17/2020 1011 Last data filed at 02/16/2020 2204 Gross per 24 hour  Intake --  Output 400 ml  Net -400 ml   Filed Weights   02/15/20 0500 02/16/20 0500 02/17/20 0500  Weight: 62.9 kg 62.9 kg 64.1 kg    Examination:   General exam: comfortable HEENT:PERRL,Oral mucosa moist, Ear/Nose normal on gross exam Respiratory system: Bilateral basal crackles Cardiovascular system: S1 & S2 heard, RRR. No JVD, murmurs, rubs, gallops or clicks. Gastrointestinal system: Abdomen is nondistended, soft and nontender. No organomegaly or masses felt. Normal bowel sounds heard. Central nervous system: Alert and oriented. No focal neurological deficits. Extremities: No edema, no clubbing ,no cyanosis, cast on bilateral lower extremities  skin: No rashes, lesions or ulcers,no icterus ,no pallor   Data Reviewed: I have personally reviewed following labs and imaging studies  CBC: Recent Labs  Lab 02/11/20 1646  WBC 11.4*  HGB 13.7  HCT 45.2  MCV 97.4  PLT 324   Basic Metabolic Panel: Recent Labs  Lab 02/12/20 0422 02/13/20 0541 02/14/20 0532 02/15/20 0645 02/16/20 0626  NA 134* 135 135 136 135  K 4.8 5.0 5.0 4.1 4.6  CL 95* 96* 92* 88* 87*  CO2 32 32 34* 39* 38*  GLUCOSE 122* 129* 89 103* 103*  BUN 28* 25* 23 19 19   CREATININE 0.83 0.71 0.70 0.72 0.58  CALCIUM 8.5* 8.6* 8.6* 8.8* 8.8*   GFR: Estimated Creatinine Clearance: 41.9 mL/min (by C-G formula based on SCr of 0.58 mg/dL). Liver Function Tests: Recent Labs  Lab 02/11/20 1646  AST 30  ALT 37  ALKPHOS 97  BILITOT 0.5  PROT 8.0  ALBUMIN  3.4*   No results for input(s): LIPASE, AMYLASE in the last 168 hours. No results for input(s): AMMONIA in the last 168 hours. Coagulation Profile: Recent Labs  Lab 02/11/20 1646  INR 1.1   Cardiac Enzymes: No results for input(s): CKTOTAL, CKMB, CKMBINDEX, TROPONINI in the last 168 hours. BNP (last 3 results) No results for input(s): PROBNP in the last 8760 hours. HbA1C: No results for input(s): HGBA1C in the last 72 hours. CBG: No results for input(s): GLUCAP in the last 168 hours. Lipid Profile: No results for input(s): CHOL, HDL, LDLCALC, TRIG, CHOLHDL, LDLDIRECT in the last 72 hours. Thyroid Function Tests: No results for input(s): TSH, T4TOTAL, FREET4, T3FREE, THYROIDAB in the last 72 hours. Anemia Panel: No results for input(s): VITAMINB12, FOLATE, FERRITIN, TIBC, IRON, RETICCTPCT in the last 72 hours. Sepsis Labs: No results for input(s): PROCALCITON, LATICACIDVEN in the last 168 hours.  Recent Results (from the past 240 hour(s))  Resp Panel by RT PCR (RSV, Flu A&B, Covid) - Nasopharyngeal Swab     Status: None   Collection Time: 02/11/20  7:47 PM  Specimen: Nasopharyngeal Swab  Result Value Ref Range Status   SARS Coronavirus 2 by RT PCR NEGATIVE NEGATIVE Final    Comment: (NOTE) SARS-CoV-2 target nucleic acids are NOT DETECTED.  The SARS-CoV-2 RNA is generally detectable in upper respiratoy specimens during the acute phase of infection. The lowest concentration of SARS-CoV-2 viral copies this assay can detect is 131 copies/mL. A negative result does not preclude SARS-Cov-2 infection and should not be used as the sole basis for treatment or other patient management decisions. A negative result may occur with  improper specimen collection/handling, submission of specimen other than nasopharyngeal swab, presence of viral mutation(s) within the areas targeted by this assay, and inadequate number of viral copies (<131 copies/mL). A negative result must be combined  with clinical observations, patient history, and epidemiological information. The expected result is Negative.  Fact Sheet for Patients:  PinkCheek.be  Fact Sheet for Healthcare Providers:  GravelBags.it  This test is no t yet approved or cleared by the [place] FDA and  has been authorized for detection and/or diagnosis of SARS-CoV-2 by FDA under an Emergency Use Authorization (EUA). This EUA will remain  in effect (meaning this test can be used) for the duration of the COVID-19 declaration under Section 564(b)(1) of the Act, 21 U.S.C. section 360bbb-3(b)(1), unless the authorization is terminated or revoked sooner.     Influenza A by PCR NEGATIVE NEGATIVE Final   Influenza B by PCR NEGATIVE NEGATIVE Final    Comment: (NOTE) The Xpert Xpress SARS-CoV-2/FLU/RSV assay is intended as an aid in  the diagnosis of influenza from Nasopharyngeal swab specimens and  should not be used as a sole basis for treatment. Nasal washings and  aspirates are unacceptable for Xpert Xpress SARS-CoV-2/FLU/RSV  testing.  Fact Sheet for Patients: PinkCheek.be  Fact Sheet for Healthcare Providers: GravelBags.it  This test is not yet approved or cleared by the [place] FDA and  has been authorized for detection and/or diagnosis of SARS-CoV-2 by  FDA under an Emergency Use Authorization (EUA). This EUA will remain  in effect (meaning this test can be used) for the duration of the  Covid-19 declaration under Section 564(b)(1) of the Act, 21  U.S.C. section 360bbb-3(b)(1), unless the authorization is  terminated or revoked.    Respiratory Syncytial Virus by PCR NEGATIVE NEGATIVE Final    Comment: (NOTE) Fact Sheet for Patients: PinkCheek.be  Fact Sheet for Healthcare Providers: GravelBags.it  This test is not yet  approved or cleared by the [place] FDA and  has been authorized for detection and/or diagnosis of SARS-CoV-2 by  FDA under an Emergency Use Authorization (EUA). This EUA will remain  in effect (meaning this test can be used) for the duration of the  COVID-19 declaration under Section 564(b)(1) of the Act, 21 U.S.C.  section 360bbb-3(b)(1), unless the authorization is terminated or  revoked. Performed at [facility], [place] [address] [place], [place] [address]          Radiology Studies: No results found.      Scheduled Meds: . amLODipine  5 mg Oral Daily  . aspirin EC  81 mg Oral Daily  . carvedilol  12.5 mg Oral BID WC  . docusate sodium  100 mg Oral Daily  . DULoxetine  60 mg Oral Daily  . enoxaparin (LOVENOX) injection  40 mg Subcutaneous Q24H  . latanoprost  1 drop Both Eyes QHS  . levothyroxine  112 mcg Oral Q0600  . melatonin  10 mg Oral  QHS  . mometasone-formoterol  2 puff Inhalation BID  . polyethylene glycol  17 g Oral Daily  . senna-docusate  1 tablet Oral BID  . sodium chloride flush  3 mL Intravenous Q12H   Continuous Infusions: . sodium chloride       LOS: 6 days    Time spent: 15 mins,More than 50% of that time was spent in counseling and/or coordination of care.      Shelly Coss, MD [facility] P11/21/2021, 10:11 AM

## 2020-02-17 NOTE — TOC Progression Note (Addendum)
Transition of Care [facility]) - Progression Note    Patient Details  Name: [name] MRN: [id number] Date of Birth: 03-11-1929  Transition of Care [facility] CM/SW Contact  [name], [place] Phone Number: 02/17/2020, 10:33 AM  Clinical Narrative:   Patients daughter chose [facility] ([facility] LCSW left voicemail for [name]. Spoke with [name], weekend supervisor who confirmed they have beds. PT needs COVID test. Patient can dc tomorrow pending COVID results.   12:53 PM Patients daughter had questions about location of patients at home meds that were brought in on admission. Daughter stated that a RN was helping to locate meds. LCSW did not see a note about missing meds. Meds will need to go with patient to facility.    Expected Discharge Plan: [place] Barriers to Discharge: Continued Medical Work up, SNF Pending bed offer  Expected Discharge Plan and Services Expected Discharge Plan: [place]   Discharge Planning Services: CM Consult Post Acute Care Choice: [place] Living arrangements for the past 2 months: Single Family Home                                       Social Determinants of Health (SDOH) Interventions    Readmission Risk Interventions No flowsheet data found.

## 2020-02-18 DIAGNOSIS — E785 Hyperlipidemia, unspecified: Secondary | ICD-10-CM | POA: Diagnosis not present

## 2020-02-18 DIAGNOSIS — K5900 Constipation, unspecified: Secondary | ICD-10-CM | POA: Diagnosis not present

## 2020-02-18 DIAGNOSIS — M159 Polyosteoarthritis, unspecified: Secondary | ICD-10-CM | POA: Diagnosis not present

## 2020-02-18 DIAGNOSIS — R062 Wheezing: Secondary | ICD-10-CM | POA: Diagnosis not present

## 2020-02-18 DIAGNOSIS — Z950 Presence of cardiac pacemaker: Secondary | ICD-10-CM | POA: Diagnosis not present

## 2020-02-18 DIAGNOSIS — E559 Vitamin D deficiency, unspecified: Secondary | ICD-10-CM | POA: Diagnosis not present

## 2020-02-18 DIAGNOSIS — J189 Pneumonia, unspecified organism: Secondary | ICD-10-CM | POA: Diagnosis not present

## 2020-02-18 DIAGNOSIS — M2550 Pain in unspecified joint: Secondary | ICD-10-CM | POA: Diagnosis not present

## 2020-02-18 DIAGNOSIS — F32A Depression, unspecified: Secondary | ICD-10-CM | POA: Diagnosis not present

## 2020-02-18 DIAGNOSIS — Z95810 Presence of automatic (implantable) cardiac defibrillator: Secondary | ICD-10-CM | POA: Diagnosis not present

## 2020-02-18 DIAGNOSIS — S92001D Unspecified fracture of right calcaneus, subsequent encounter for fracture with routine healing: Secondary | ICD-10-CM | POA: Diagnosis not present

## 2020-02-18 DIAGNOSIS — E039 Hypothyroidism, unspecified: Secondary | ICD-10-CM | POA: Diagnosis not present

## 2020-02-18 DIAGNOSIS — S82892D Other fracture of left lower leg, subsequent encounter for closed fracture with routine healing: Secondary | ICD-10-CM | POA: Diagnosis not present

## 2020-02-18 DIAGNOSIS — S82852D Displaced trimalleolar fracture of left lower leg, subsequent encounter for closed fracture with routine healing: Secondary | ICD-10-CM | POA: Diagnosis not present

## 2020-02-18 DIAGNOSIS — I110 Hypertensive heart disease with heart failure: Secondary | ICD-10-CM | POA: Diagnosis not present

## 2020-02-18 DIAGNOSIS — Z7982 Long term (current) use of aspirin: Secondary | ICD-10-CM | POA: Diagnosis not present

## 2020-02-18 DIAGNOSIS — R296 Repeated falls: Secondary | ICD-10-CM | POA: Diagnosis not present

## 2020-02-18 DIAGNOSIS — G629 Polyneuropathy, unspecified: Secondary | ICD-10-CM | POA: Diagnosis not present

## 2020-02-18 DIAGNOSIS — R601 Generalized edema: Secondary | ICD-10-CM | POA: Diagnosis not present

## 2020-02-18 DIAGNOSIS — S82842A Displaced bimalleolar fracture of left lower leg, initial encounter for closed fracture: Secondary | ICD-10-CM | POA: Diagnosis not present

## 2020-02-18 DIAGNOSIS — G253 Myoclonus: Secondary | ICD-10-CM | POA: Diagnosis not present

## 2020-02-18 DIAGNOSIS — J45909 Unspecified asthma, uncomplicated: Secondary | ICD-10-CM | POA: Diagnosis not present

## 2020-02-18 DIAGNOSIS — F0390 Unspecified dementia without behavioral disturbance: Secondary | ICD-10-CM | POA: Diagnosis not present

## 2020-02-18 DIAGNOSIS — G4700 Insomnia, unspecified: Secondary | ICD-10-CM | POA: Diagnosis not present

## 2020-02-18 DIAGNOSIS — R41841 Cognitive communication deficit: Secondary | ICD-10-CM | POA: Diagnosis not present

## 2020-02-18 DIAGNOSIS — R279 Unspecified lack of coordination: Secondary | ICD-10-CM | POA: Diagnosis not present

## 2020-02-18 DIAGNOSIS — S92001A Unspecified fracture of right calcaneus, initial encounter for closed fracture: Secondary | ICD-10-CM | POA: Diagnosis not present

## 2020-02-18 DIAGNOSIS — R0602 Shortness of breath: Secondary | ICD-10-CM | POA: Diagnosis not present

## 2020-02-18 DIAGNOSIS — M810 Age-related osteoporosis without current pathological fracture: Secondary | ICD-10-CM | POA: Diagnosis not present

## 2020-02-18 DIAGNOSIS — Z743 Need for continuous supervision: Secondary | ICD-10-CM | POA: Diagnosis not present

## 2020-02-18 DIAGNOSIS — G620 Drug-induced polyneuropathy: Secondary | ICD-10-CM | POA: Diagnosis not present

## 2020-02-18 DIAGNOSIS — H409 Unspecified glaucoma: Secondary | ICD-10-CM | POA: Diagnosis not present

## 2020-02-18 DIAGNOSIS — D649 Anemia, unspecified: Secondary | ICD-10-CM | POA: Diagnosis not present

## 2020-02-18 DIAGNOSIS — G250 Essential tremor: Secondary | ICD-10-CM | POA: Diagnosis not present

## 2020-02-18 DIAGNOSIS — I509 Heart failure, unspecified: Secondary | ICD-10-CM | POA: Diagnosis not present

## 2020-02-18 DIAGNOSIS — G894 Chronic pain syndrome: Secondary | ICD-10-CM | POA: Diagnosis not present

## 2020-02-18 DIAGNOSIS — J9611 Chronic respiratory failure with hypoxia: Secondary | ICD-10-CM | POA: Diagnosis not present

## 2020-02-18 DIAGNOSIS — R404 Transient alteration of awareness: Secondary | ICD-10-CM | POA: Diagnosis not present

## 2020-02-18 DIAGNOSIS — I5042 Chronic combined systolic (congestive) and diastolic (congestive) heart failure: Secondary | ICD-10-CM | POA: Diagnosis not present

## 2020-02-18 DIAGNOSIS — Z760 Encounter for issue of repeat prescription: Secondary | ICD-10-CM | POA: Diagnosis not present

## 2020-02-18 DIAGNOSIS — R9431 Abnormal electrocardiogram [ECG] [EKG]: Secondary | ICD-10-CM | POA: Diagnosis not present

## 2020-02-18 DIAGNOSIS — R2681 Unsteadiness on feet: Secondary | ICD-10-CM | POA: Diagnosis not present

## 2020-02-18 DIAGNOSIS — J449 Chronic obstructive pulmonary disease, unspecified: Secondary | ICD-10-CM | POA: Diagnosis not present

## 2020-02-18 DIAGNOSIS — Z9981 Dependence on supplemental oxygen: Secondary | ICD-10-CM | POA: Diagnosis not present

## 2020-02-18 DIAGNOSIS — I10 Essential (primary) hypertension: Secondary | ICD-10-CM | POA: Diagnosis not present

## 2020-02-18 DIAGNOSIS — K649 Unspecified hemorrhoids: Secondary | ICD-10-CM | POA: Diagnosis not present

## 2020-02-18 DIAGNOSIS — R52 Pain, unspecified: Secondary | ICD-10-CM | POA: Diagnosis not present

## 2020-02-18 DIAGNOSIS — R5381 Other malaise: Secondary | ICD-10-CM | POA: Diagnosis not present

## 2020-02-18 DIAGNOSIS — I5032 Chronic diastolic (congestive) heart failure: Secondary | ICD-10-CM | POA: Diagnosis not present

## 2020-02-18 DIAGNOSIS — Z9181 History of falling: Secondary | ICD-10-CM | POA: Diagnosis not present

## 2020-02-18 DIAGNOSIS — M6281 Muscle weakness (generalized): Secondary | ICD-10-CM | POA: Diagnosis not present

## 2020-02-18 DIAGNOSIS — I5033 Acute on chronic diastolic (congestive) heart failure: Secondary | ICD-10-CM | POA: Diagnosis not present

## 2020-02-18 LAB — CREATININE, SERUM
Creatinine, Ser: 0.76 mg/dL (ref 0.44–1.00)
GFR, Estimated: >60 mL/min (ref >60)

## 2020-02-18 MED ORDER — DORZOLAMIDE HCL-TIMOLOL MAL 2-0.5 % OP SOLN
1.0000 [drp] | Freq: Two times a day (BID) | OPHTHALMIC | 12 refills | Status: DC
Start: 2020-02-18 — End: 2022-10-06

## 2020-02-18 MED ORDER — LATANOPROST 0.005 % OP SOLN
1.0000 [drp] | Freq: Every day | OPHTHALMIC | 12 refills | Status: DC
Start: 2020-02-18 — End: 2022-10-06

## 2020-02-18 MED ORDER — POLYETHYLENE GLYCOL 3350 17 G PO PACK
17.0000 g | PACK | Freq: Every day | ORAL | 0 refills | Status: DC | PRN
Start: 2020-02-18 — End: 2020-05-23

## 2020-02-18 NOTE — Discharge Summary (Signed)
Physician Discharge Summary  EMALIE MCWETHY XNA:[id number] DOB: 09-22-1928 DOA: 02/11/2020  PCP: Carolee Rota, NP  Admit date: 02/11/2020 Discharge date: 02/18/2020  Admitted From: Home Disposition:  Home  Discharge Condition:Stable CODE STATUS:FULL Diet recommendation: Heart Healthy  Brief/Interim Summary:  Patient is an 86 year old female with history of congestive heart failure, COPD, hypothyroidism, status post AICD who presented to the emergency department with complaint of fall at home.  Also reported increasing shortness of breath that worsened with exertion.  On presentation she was found to have elevated BNP, chest x-ray showed mild interstitial edema.  She was requiring 2 L of oxygen for maintenance of her saturation.  X-rays showed  left ankle showed fracture and avulsion type fracture involving the posterosuperior aspect of the calcaneus .  Orthopedics consulted who recommended nonoperative management and outpatient follow-up and not weightbearing on both lower extremities until seen as an outpatient.  PT/OT recommended skilled nursing  facility on discharge.  Patient is medically stable for discharge to skilled nursing facility today.  Following problems were addressed during her hospitalization:   Fall/left ankle fracture/right calcaneal fracture: Splint placed in the emergency department on the left .Xray showed Minimally comminuted, mildly displaced trimalleolar fracture of the ankle and also avulsion type fracture involving the posterosuperior aspect of the calcaneus. Orthopedics consulted who recommended nonoperative management and outpatient follow-up,nonweight bearing status.  PT/OT recommended skilled nursing  facility on discharge.  Acute on chronic hypoxic respiratory failure: Requiring 2 L of oxygen per minute.  Hypoxia secondary to congestive heart failure/COPD.  Patient was on oxygen at home in the past  but not now.  She has bilateral basal crackles.  Chest x-ray  showed chronic changes.  Continue supplemental oxygen  Dyspnea/acute on chronic diastolic congestive heart failure: Elevated BNP on presentation.  She is complaining of dyspnea on exertion.  Echocardiogram .  On beta-blockers.    Continue to monitor daily weight, input/output. Echocardiogram showed left ventricular ejection fraction of 70 to 75%,inderterminate  left ventricular diastolic parameters.On Lasix 40 mg daily at home. She was started on high-dose Lasix on admission.  She doesnot have any lower extremity edema.  S/P few doses  of lasix IV.She has H/O nonischemic cardiomyopathy and is status post ICD placement several years ago.  Restarted home dose Lasix.  COPD: Currently stable.  Continue as needed bronchodilators, home inhalers  Hypertension: On Norvasc and Coreg at home which have been  resumed.  Continue to monitor blood pressure.  Hypothyroidism: Continue Synthyroid  Hyperlipidemia: Continue statin  Dementia: Intermittently confused at baseline.  Continue supportive care.  Delirium precautions.  Currently alert and oriented    Discharge Diagnoses:  Principal Problem:   Acute on chronic heart failure with preserved ejection fraction (HFpEF) (HCC) Active Problems:   COPD (chronic obstructive pulmonary disease) (HCC)   ICD (implantable cardioverter-defibrillator) battery depletion   Essential hypertension   Fracture of left ankle, closed, initial encounter   Fall at home, initial encounter   Prolonged QT interval    Discharge Instructions  Discharge Instructions    Diet - low sodium heart healthy   Complete by: As directed    Discharge instructions   Complete by: As directed    1)Take prescribed medications as instructed 2)Follow up with Dr. Lucia Gaskins, orthopedics, in 2 weeks.  Name and number the provider has been attached.  Nonweightbearing until you see the orthopedics 3)Do a CBC and BMP in a week.   Increase activity slowly   Complete by: As directed  No wound care   Complete by: As directed      Allergies as of 02/18/2020   No Known Allergies     Medication List    STOP taking these medications   Lumigan 0.01 % Soln Generic drug: bimatoprost Replaced by: latanoprost 0.005 % ophthalmic solution     TAKE these medications   albuterol 108 (90 Base) MCG/ACT inhaler Commonly known as: VENTOLIN HFA Inhale 1-2 puffs into the lungs every 6 (six) hours as needed for wheezing or shortness of breath.   alendronate 70 MG tablet Commonly known as: FOSAMAX Take 70 mg by mouth every Sunday.   amLODipine 5 MG tablet Commonly known as: NORVASC Take 1 tablet (5 mg total) by mouth daily.   aspirin EC 81 MG tablet Take 1 tablet (81 mg total) by mouth daily.   carvedilol 12.5 MG tablet Commonly known as: COREG Take 1 tablet (12.5 mg total) by mouth 2 (two) times daily with a meal.   dorzolamide-timolol 22.3-6.8 MG/ML ophthalmic solution Commonly known as: COSOPT Place 1 drop into both eyes 2 (two) times daily. What changed:   when to take this  reasons to take this   Dulera 100-5 MCG/ACT Aero Generic drug: mometasone-formoterol Inhale 2 puffs into the lungs every 6 (six) hours as needed for wheezing or shortness of breath.   DULoxetine 60 MG capsule Commonly known as: CYMBALTA Take 60 mg by mouth daily.   furosemide 20 MG tablet Commonly known as: LASIX Take 40 mg by mouth daily. What changed: Another medication with the same name was removed. Continue taking this medication, and follow the directions you see here.   gabapentin 100 MG capsule Commonly known as: NEURONTIN Take 100-300 mg by mouth at bedtime as needed (pain).   latanoprost 0.005 % ophthalmic solution Commonly known as: XALATAN Place 1 drop into both eyes at bedtime. Replaces: Lumigan 0.01 % Soln   liothyronine 25 MCG tablet Commonly known as: CYTOMEL Take 25 mcg by mouth daily.   Melatonin 10 MG Tabs Take 10 mg by mouth at bedtime.    multivitamin with minerals Tabs tablet Take 1 tablet by mouth daily.   oxyCODONE 15 MG immediate release tablet Commonly known as: ROXICODONE Take 1 tablet (15 mg total) by mouth every 6 (six) hours as needed for pain. What changed:   when to take this  reasons to take this   polyethylene glycol 17 g packet Commonly known as: MIRALAX / GLYCOLAX Take 17 g by mouth daily as needed.   Synthroid 112 MCG tablet Generic drug: levothyroxine Take 112 mcg by mouth daily before breakfast.   traZODone 50 MG tablet Commonly known as: DESYREL Take 50 mg by mouth at bedtime as needed for sleep.   VITAMIN D3 PO Take 1 tablet by mouth once a week.       Contact information for follow-up providers    Erle Crocker, MD. Schedule an appointment as soon as possible for a visit in 2 weeks.   Specialty: Orthopedic Surgery Contact information: [place] [address] [phone]            Contact information for after-discharge care    Destination    [facility] .   Service: Skilled Nursing Contact information: [name] [place]                 No Known Allergies  Consultations:  Orthopedics   Procedures/Studies:  DG Tibia/Fibula Left  Result Date: 02/11/2020 CLINICAL DATA:  Ankle fracture, fall, left lower leg, ankle and foot pain EXAM: LEFT TIBIA AND FIBULA - 2 VIEW LEFT FOOT - COMPLETE 3+ VIEW LEFT ANKLE COMPLETE - 3+ VIEW COMPARISON:  Left ankle radiograph 01/03/2012 FINDINGS: The osseous structures appear diffusely demineralized which may limit detection of small or nondisplaced fractures. Tiny crescentic radiodensity is seen along the posterior corner of the proximal tibia which is favored to be enthesopathic/degenerative in nature though could correlate for point tenderness. No additional proximal to mid tibia or fibular fractures are seen. Diffuse  edematous changes of the lower leg are noted with associated skin thickening. Minimally comminuted, mildly displaced trimalleolar fracture of the ankle including an obliquely oriented trans syndesmotic distal fibular fracture (Weber B), a transversely oriented medial malleolar avulsive type fracture, and a slightly superiorly displaced posterior malleolar fracture. Diffuse circumferential soft tissue swelling of the ankle is noted. Suboptimal mortise projection at the level of the ankle may limit complete accurate assessment however the constellation of fracture findings are compatible with a stage IV injury which is unstable. Possible avulsive type fracture seen of the lateral process of the talus and along the proximal lateral aspect of the cuboid. No additional fractures are seen in the foot. Alignment is grossly preserved though incompletely assessed on nonweightbearing view. Background of degenerative change throughout the foot. Bidirectional calcaneal spurs. Suspect enthesopathic changes near the proximal pole of the navicular though could correlate for point tenderness. IMPRESSION: 1. Diffuse bony demineralization may limit detection of subtle or nondisplaced fractures. 2. Crescentic radiodensity along the posterolateral corner of the tibial plateau. Possibly degenerative though could correlate for point tenderness. 3. Minimally comminuted, mildly displaced trimalleolar fracture of the ankle in a pattern compatible with a Weber B stage IV unstable ankle injury. 4. Suspect small avulsive type fractures seen of the lateral process of the talus and along the proximal lateral aspect of the cuboid. Additional fragmentation along the proximal pole navicular is favored to be enthesopathic though correlate for point tenderness to exclude fracture. Electronically Signed   By: Lovena Le M.D.   On: 02/11/2020 19:10   DG Ankle Complete Left  Result Date: 02/11/2020 CLINICAL DATA:  Ankle fracture, fall, left lower  leg, ankle and foot pain EXAM: LEFT TIBIA AND FIBULA - 2 VIEW LEFT FOOT - COMPLETE 3+ VIEW LEFT ANKLE COMPLETE - 3+ VIEW COMPARISON:  Left ankle radiograph 01/03/2012 FINDINGS: The osseous structures appear diffusely demineralized which may limit detection of small or nondisplaced fractures. Tiny crescentic radiodensity is seen along the posterior corner of the proximal tibia which is favored to be enthesopathic/degenerative in nature though could correlate for point tenderness. No additional proximal to mid tibia or fibular fractures are seen. Diffuse edematous changes of the lower leg are noted with associated skin thickening. Minimally comminuted, mildly displaced trimalleolar fracture of the ankle including an obliquely oriented trans syndesmotic distal fibular fracture (Weber B), a transversely oriented medial malleolar avulsive type fracture, and a slightly superiorly displaced posterior malleolar fracture. Diffuse circumferential soft tissue swelling of the ankle is noted. Suboptimal mortise projection at the level of the ankle may limit complete accurate assessment however the constellation of fracture findings are compatible with a stage IV injury which is unstable. Possible avulsive type fracture seen of the lateral process of the talus and along the proximal lateral aspect of the cuboid. No additional fractures are seen in the foot. Alignment is grossly preserved though incompletely assessed on nonweightbearing view. Background  of degenerative change throughout the foot. Bidirectional calcaneal spurs. Suspect enthesopathic changes near the proximal pole of the navicular though could correlate for point tenderness. IMPRESSION: 1. Diffuse bony demineralization may limit detection of subtle or nondisplaced fractures. 2. Crescentic radiodensity along the posterolateral corner of the tibial plateau. Possibly degenerative though could correlate for point tenderness. 3. Minimally comminuted, mildly displaced  trimalleolar fracture of the ankle in a pattern compatible with a Weber B stage IV unstable ankle injury. 4. Suspect small avulsive type fractures seen of the lateral process of the talus and along the proximal lateral aspect of the cuboid. Additional fragmentation along the proximal pole navicular is favored to be enthesopathic though correlate for point tenderness to exclude fracture. Electronically Signed   By: Lovena Le M.D.   On: 02/11/2020 19:10   CT FOOT RIGHT WO CONTRAST  Result Date: 02/13/2020 CLINICAL DATA:  Right foot fracture. EXAM: CT OF THE RIGHT FOOT WITHOUT CONTRAST TECHNIQUE: Multidetector CT imaging of the right foot was performed according to the standard protocol. Multiplanar CT image reconstructions were also generated. COMPARISON:  Radiographs 02/12/2020 and 01/03/2012 FINDINGS: Bones/Joint/Cartilage The bones appear severely demineralized. As seen on the prior radiographs, there is a mildly displaced avulsion fracture of the superior aspect of the calcaneal tuberosity. There is no extension of this fracture into the subtalar joint which appears intact. No other evidence of acute fracture or dislocation. There are mild midfoot and 1st MTP joint degenerative changes. Type 2 accessory navicular noted. Ligaments Suboptimally assessed by CT. Muscles and Tendons The Achilles tendon appears intact and inserts on the calcaneal avulsion fracture. The additional ankle tendons appear intact. Generalized atrophy throughout the foot musculature. Soft tissues No focal hematoma, soft tissue emphysema or foreign body. Minimal atherosclerosis noted. IMPRESSION: 1. Mildly displaced avulsion fracture of the superior aspect of the calcaneal tuberosity. No extension of this fracture into the subtalar joint which appears intact. Given underlying osteopenia, this is likely an insufficiency fracture. 2. No other evidence of acute fracture or dislocation. 3. Intact Achilles tendon. Electronically Signed   By:  Richardean Sale M.D.   On: 02/13/2020 19:31   DG CHEST PORT 1 VIEW  Result Date: 02/15/2020 CLINICAL DATA:  Shortness of breath EXAM: PORTABLE CHEST 1 VIEW COMPARISON:  02/11/2020 and earlier FINDINGS: Biventricular pacer from the left. Mild cardiomegaly. Stable mediastinal contours including hilar prominence. Chronic interstitial reticulation. There is no edema, consolidation, effusion, or pneumothorax. IMPRESSION: Chronic lung disease and cardiomegaly. No acute finding when compared to priors. Electronically Signed   By: Monte Fantasia M.D.   On: 02/15/2020 12:08   DG Chest Port 1 View  Result Date: 02/11/2020 CLINICAL DATA:  Shortness of breath EXAM: PORTABLE CHEST 1 VIEW COMPARISON:  March 14, 2019 FINDINGS: The heart size and mediastinal contours mildly enlarged. Aortic knob calcifications are seen. A left-sided pacemaker seen with the lead tips at the right ventricle. Mildly increased interstitial markings are seen throughout both lungs. No acute osseous abnormality. IMPRESSION: Mildly increased interstitial markings which could be due to atelectasis and/or interstitial edema. Electronically Signed   By: Prudencio Pair M.D.   On: 02/11/2020 17:09   DG Foot 2 Views Right  Result Date: 02/12/2020 CLINICAL DATA:  Pain and swelling along the posterior ankle. EXAM: RIGHT FOOT - 2 VIEW COMPARISON:  None. FINDINGS: There is an avulsion type fracture involving the posterosuperior aspect of the calcaneus. Difficult to identify the Achilles tendon. MRI may be helpful for further evaluation. The other bony structures appear  intact. IMPRESSION: Avulsion type fracture involving the posterosuperior aspect of the calcaneus. Poor definition of the Achilles tendon. Electronically Signed   By: Marijo Sanes M.D.   On: 02/12/2020 18:03   DG Foot Complete Left  Result Date: 02/11/2020 CLINICAL DATA:  Ankle fracture, fall, left lower leg, ankle and foot pain EXAM: LEFT TIBIA AND FIBULA - 2 VIEW LEFT FOOT -  COMPLETE 3+ VIEW LEFT ANKLE COMPLETE - 3+ VIEW COMPARISON:  Left ankle radiograph 01/03/2012 FINDINGS: The osseous structures appear diffusely demineralized which may limit detection of small or nondisplaced fractures. Tiny crescentic radiodensity is seen along the posterior corner of the proximal tibia which is favored to be enthesopathic/degenerative in nature though could correlate for point tenderness. No additional proximal to mid tibia or fibular fractures are seen. Diffuse edematous changes of the lower leg are noted with associated skin thickening. Minimally comminuted, mildly displaced trimalleolar fracture of the ankle including an obliquely oriented trans syndesmotic distal fibular fracture (Weber B), a transversely oriented medial malleolar avulsive type fracture, and a slightly superiorly displaced posterior malleolar fracture. Diffuse circumferential soft tissue swelling of the ankle is noted. Suboptimal mortise projection at the level of the ankle may limit complete accurate assessment however the constellation of fracture findings are compatible with a stage IV injury which is unstable. Possible avulsive type fracture seen of the lateral process of the talus and along the proximal lateral aspect of the cuboid. No additional fractures are seen in the foot. Alignment is grossly preserved though incompletely assessed on nonweightbearing view. Background of degenerative change throughout the foot. Bidirectional calcaneal spurs. Suspect enthesopathic changes near the proximal pole of the navicular though could correlate for point tenderness. IMPRESSION: 1. Diffuse bony demineralization may limit detection of subtle or nondisplaced fractures. 2. Crescentic radiodensity along the posterolateral corner of the tibial plateau. Possibly degenerative though could correlate for point tenderness. 3. Minimally comminuted, mildly displaced trimalleolar fracture of the ankle in a pattern compatible with a Weber B  stage IV unstable ankle injury. 4. Suspect small avulsive type fractures seen of the lateral process of the talus and along the proximal lateral aspect of the cuboid. Additional fragmentation along the proximal pole navicular is favored to be enthesopathic though correlate for point tenderness to exclude fracture. Electronically Signed   By: Lovena Le M.D.   On: 02/11/2020 19:10   ECHOCARDIOGRAM COMPLETE  Result Date: 02/12/2020    ECHOCARDIOGRAM REPORT   Patient Name:   TALAYAH PICARDI Date of Exam: 02/12/2020 Medical Rec #:  [id number]     Height:       62.0 in Accession #:    [id number]    Weight:       141.1 lb Date of Birth:  20-Aug-1928     BSA:          1.648 m Patient Age:    77 years      BP:           165/86 mmHg Patient Gender: F             HR:           75 bpm. Exam Location:  Inpatient Procedure: 2D Echo, Color Doppler and Cardiac Doppler Indications:    Q22.29 Acute diastolic (congestive) heart failure  History:        Patient has prior history of Echocardiogram examinations, most                 recent 10/18/2017. Pacemaker and Defibrillator, COPD,  Arrythmias:Atrial Flutter; Risk Factors:Hypertension.  Sonographer:    Raquel Sarna Senior RDCS Referring Phys: [id number] [place]  1. Left ventricular ejection fraction, by estimation, is 70 to 75%. The left ventricle has hyperdynamic function. The left ventricle has no regional wall motion abnormalities. There is moderate concentric left ventricular hypertrophy. Left ventricular diastolic parameters are indeterminate.  2. Right ventricular systolic function is normal. The right ventricular size is normal. There is mildly elevated pulmonary artery systolic pressure. The estimated right ventricular systolic pressure is 14.4 mmHg.  3. Left atrial size was severely dilated.  4. Right atrial size was mildly dilated.  5. The mitral valve is degenerative. Trivial mitral valve regurgitation. Mild mitral stenosis. The mean mitral  valve gradient is 5.2 mmHg with average heart rate of 73 bpm. Severe mitral annular calcification.  6. The aortic valve is abnormal. There is moderate calcification of the aortic valve. Aortic valve regurgitation is not visualized. Mild aortic valve stenosis. Aortic valve mean gradient measures 10.0 mmHg.  7. The inferior vena cava is dilated in size with <50% respiratory variability, suggesting right atrial pressure of 15 mmHg. FINDINGS  Left Ventricle: Left ventricular ejection fraction, by estimation, is 70 to 75%. The left ventricle has hyperdynamic function. The left ventricle has no regional wall motion abnormalities. The left ventricular internal cavity size was small. There is moderate concentric left ventricular hypertrophy. Left ventricular diastolic parameters are indeterminate. Right Ventricle: The right ventricular size is normal. No increase in right ventricular wall thickness. Right ventricular systolic function is normal. There is mildly elevated pulmonary artery systolic pressure. The tricuspid regurgitant velocity is 2.61  m/s, and with an assumed right atrial pressure of 15 mmHg, the estimated right ventricular systolic pressure is 31.5 mmHg. Left Atrium: Left atrial size was severely dilated. Right Atrium: Right atrial size was mildly dilated. Pericardium: There is no evidence of pericardial effusion. Mitral Valve: The mitral valve is degenerative in appearance. Severe mitral annular calcification. Trivial mitral valve regurgitation. Mild mitral valve stenosis. MV peak gradient, 11.9 mmHg. The mean mitral valve gradient is 5.2 mmHg with average heart rate of 73 bpm. Tricuspid Valve: The tricuspid valve is grossly normal. Tricuspid valve regurgitation is trivial. Aortic Valve: The aortic valve is abnormal. There is moderate calcification of the aortic valve. Aortic valve regurgitation is not visualized. Mild aortic stenosis is present. Aortic valve mean gradient measures 10.0 mmHg. Aortic valve  peak gradient measures 16.6 mmHg. Aortic valve area, by VTI measures 1.75 cm. Pulmonic Valve: The pulmonic valve was not well visualized. Pulmonic valve regurgitation is trivial. Aorta: The aortic root is normal in size and structure. Venous: The inferior vena cava is dilated in size with less than 50% respiratory variability, suggesting right atrial pressure of 15 mmHg. IAS/Shunts: No atrial level shunt detected by color flow Doppler. Additional Comments: A pacer wire is visualized in the right atrium and right ventricle.  LEFT VENTRICLE PLAX 2D LVIDd:         3.13 cm  Diastology LVIDs:         1.85 cm  LV e' medial:    4.90 cm/s LV PW:         1.44 cm  LV E/e' medial:  17.8 LV IVS:        1.46 cm  LV e' lateral:   4.79 cm/s LVOT diam:     1.80 cm  LV E/e' lateral: 18.2 LV SV:         77 LV SV Index:  47 LVOT Area:     2.54 cm  RIGHT VENTRICLE RV S prime:     10.70 cm/s TAPSE (M-mode): 2.6 cm LEFT ATRIUM             Index       RIGHT ATRIUM           Index LA diam:        4.40 cm 2.67 cm/m  RA Area:     18.60 cm LA Vol (A2C):   84.2 ml 51.08 ml/m RA Volume:   51.30 ml  31.12 ml/m LA Vol (A4C):   80.5 ml 48.84 ml/m LA Biplane Vol: 84.6 ml 51.33 ml/m  AORTIC VALVE AV Area (Vmax):    1.59 cm AV Area (Vmean):   1.72 cm AV Area (VTI):     1.75 cm AV Vmax:           203.67 cm/s AV Vmean:          147.333 cm/s AV VTI:            0.443 m AV Peak Grad:      16.6 mmHg AV Mean Grad:      10.0 mmHg LVOT Vmax:         127.00 cm/s LVOT Vmean:        99.400 cm/s LVOT VTI:          0.304 m LVOT/AV VTI ratio: 0.69  AORTA Ao Root diam: 2.90 cm Ao Asc diam:  3.10 cm MITRAL VALVE                TRICUSPID VALVE MV Area (PHT): 1.22 cm     TR Peak grad:   27.2 mmHg MV Peak grad:  11.9 mmHg    TR Vmax:        261.00 cm/s MV Mean grad:  5.2 mmHg MV Vmax:       1.72 m/s     SHUNTS MV Vmean:      108.2 cm/s   Systemic VTI:  0.30 m MV Decel Time: 623 msec     Systemic Diam: 1.80 cm MV E velocity: 87.00 cm/s MV A velocity: 149.00  cm/s MV E/A ratio:  0.58 Cherlynn Kaiser MD Electronically signed by Cherlynn Kaiser MD Signature Date/Time: 02/12/2020/2:02:18 PM    Final        Subjective: Patient seen and examined at the bedside this morning.  Hemodynamically stable for discharge today.  Discharge Exam: Vitals:   02/18/20 0503 02/18/20 0741  BP: 140/67   Pulse: 69   Resp: 16   Temp: 97.8 F (36.6 C)   SpO2: 97% 97%   Vitals:   02/18/20 0500 02/18/20 0503 02/18/20 0600 02/18/20 0741  BP:  140/67    Pulse:  69    Resp:  16    Temp:  97.8 F (36.6 C)    TempSrc:  Oral    SpO2:  97%  97%  Weight: 64.7 kg  64.5 kg   Height:        General: Pt is alert, awake, not in acute distress Cardiovascular: RRR, S1/S2 +, no rubs, no gallops Respiratory: Mild bibasilar crackles Abdominal: Soft, NT, ND, bowel sounds + Extremities: no edema, no cyanosis    The results of significant diagnostics from this hospitalization (including imaging, microbiology, ancillary and laboratory) are listed below for reference.     Microbiology: Recent Results (from the past 240 hour(s))  Resp Panel by RT PCR (RSV, Flu A&B, Covid) - Nasopharyngeal Swab  Status: None   Collection Time: 02/11/20  7:47 PM   Specimen: Nasopharyngeal Swab  Result Value Ref Range Status   SARS Coronavirus 2 by RT PCR NEGATIVE NEGATIVE Final    Comment: (NOTE) SARS-CoV-2 target nucleic acids are NOT DETECTED.  The SARS-CoV-2 RNA is generally detectable in upper respiratoy specimens during the acute phase of infection. The lowest concentration of SARS-CoV-2 viral copies this assay can detect is 131 copies/mL. A negative result does not preclude SARS-Cov-2 infection and should not be used as the sole basis for treatment or other patient management decisions. A negative result may occur with  improper specimen collection/handling, submission of specimen other than nasopharyngeal swab, presence of viral mutation(s) within the areas targeted by  this assay, and inadequate number of viral copies (<131 copies/mL). A negative result must be combined with clinical observations, patient history, and epidemiological information. The expected result is Negative.  Fact Sheet for Patients:  PinkCheek.be  Fact Sheet for Healthcare Providers:  GravelBags.it  This test is no t yet approved or cleared by the [place] FDA and  has been authorized for detection and/or diagnosis of SARS-CoV-2 by FDA under an Emergency Use Authorization (EUA). This EUA will remain  in effect (meaning this test can be used) for the duration of the COVID-19 declaration under Section 564(b)(1) of the Act, 21 U.S.C. section 360bbb-3(b)(1), unless the authorization is terminated or revoked sooner.     Influenza A by PCR NEGATIVE NEGATIVE Final   Influenza B by PCR NEGATIVE NEGATIVE Final    Comment: (NOTE) The Xpert Xpress SARS-CoV-2/FLU/RSV assay is intended as an aid in  the diagnosis of influenza from Nasopharyngeal swab specimens and  should not be used as a sole basis for treatment. Nasal washings and  aspirates are unacceptable for Xpert Xpress SARS-CoV-2/FLU/RSV  testing.  Fact Sheet for Patients: PinkCheek.be  Fact Sheet for Healthcare Providers: GravelBags.it  This test is not yet approved or cleared by the [place] FDA and  has been authorized for detection and/or diagnosis of SARS-CoV-2 by  FDA under an Emergency Use Authorization (EUA). This EUA will remain  in effect (meaning this test can be used) for the duration of the  Covid-19 declaration under Section 564(b)(1) of the Act, 21  U.S.C. section 360bbb-3(b)(1), unless the authorization is  terminated or revoked.    Respiratory Syncytial Virus by PCR NEGATIVE NEGATIVE Final    Comment: (NOTE) Fact Sheet for  Patients: PinkCheek.be  Fact Sheet for Healthcare Providers: GravelBags.it  This test is not yet approved or cleared by the [place] FDA and  has been authorized for detection and/or diagnosis of SARS-CoV-2 by  FDA under an Emergency Use Authorization (EUA). This EUA will remain  in effect (meaning this test can be used) for the duration of the  COVID-19 declaration under Section 564(b)(1) of the Act, 21 U.S.C.  section 360bbb-3(b)(1), unless the authorization is terminated or  revoked. Performed at [facility], [place] [address] [place], [place] [address]   Resp Panel by RT-PCR (Flu A&B, Covid) Nasopharyngeal Swab     Status: None   Collection Time: 02/17/20 12:38 PM   Specimen: Nasopharyngeal Swab; Nasopharyngeal(NP) swabs in vial transport medium  Result Value Ref Range Status   SARS Coronavirus 2 by RT PCR NEGATIVE NEGATIVE Final    Comment: (NOTE) SARS-CoV-2 target nucleic acids are NOT DETECTED.  The SARS-CoV-2 RNA is generally detectable in upper respiratory specimens during the acute phase of infection. The lowest concentration of  SARS-CoV-2 viral copies this assay can detect is 138 copies/mL. A negative result does not preclude SARS-Cov-2 infection and should not be used as the sole basis for treatment or other patient management decisions. A negative result may occur with  improper specimen collection/handling, submission of specimen other than nasopharyngeal swab, presence of viral mutation(s) within the areas targeted by this assay, and inadequate number of viral copies(<138 copies/mL). A negative result must be combined with clinical observations, patient history, and epidemiological information. The expected result is Negative.  Fact Sheet for Patients:  EntrepreneurPulse.com.au  Fact Sheet for Healthcare Providers:  IncredibleEmployment.be  This  test is no t yet approved or cleared by the [place] FDA and  has been authorized for detection and/or diagnosis of SARS-CoV-2 by FDA under an Emergency Use Authorization (EUA). This EUA will remain  in effect (meaning this test can be used) for the duration of the COVID-19 declaration under Section 564(b)(1) of the Act, 21 U.S.C.section 360bbb-3(b)(1), unless the authorization is terminated  or revoked sooner.       Influenza A by PCR NEGATIVE NEGATIVE Final   Influenza B by PCR NEGATIVE NEGATIVE Final    Comment: (NOTE) The Xpert Xpress SARS-CoV-2/FLU/RSV plus assay is intended as an aid in the diagnosis of influenza from Nasopharyngeal swab specimens and should not be used as a sole basis for treatment. Nasal washings and aspirates are unacceptable for Xpert Xpress SARS-CoV-2/FLU/RSV testing.  Fact Sheet for Patients: EntrepreneurPulse.com.au  Fact Sheet for Healthcare Providers: IncredibleEmployment.be  This test is not yet approved or cleared by the [place] FDA and has been authorized for detection and/or diagnosis of SARS-CoV-2 by FDA under an Emergency Use Authorization (EUA). This EUA will remain in effect (meaning this test can be used) for the duration of the COVID-19 declaration under Section 564(b)(1) of the Act, 21 U.S.C. section 360bbb-3(b)(1), unless the authorization is terminated or revoked.  Performed at [facility], [place] [address] [place], [place] [address]      Labs: BNP (last 3 results) Recent Labs    03/14/19 2359 02/11/20 1715  BNP 167.7* 518.8*   Basic Metabolic Panel: Recent Labs  Lab 02/12/20 0422 02/12/20 0422 02/13/20 0541 02/14/20 0532 02/15/20 0645 02/16/20 0626 02/18/20 0521  NA 134*  --  135 135 136 135  --   K 4.8  --  5.0 5.0 4.1 4.6  --   CL 95*  --  96* 92* 88* 87*  --   CO2 32  --  32 34* 39* 38*  --   GLUCOSE 122*  --  129* 89 103* 103*  --   BUN 28*   --  25* 23 19 19   --   CREATININE 0.83   < > 0.71 0.70 0.72 0.58 0.76  CALCIUM 8.5*  --  8.6* 8.6* 8.8* 8.8*  --    < > = values in this interval not displayed.   Liver Function Tests: Recent Labs  Lab 02/11/20 1646  AST 30  ALT 37  ALKPHOS 97  BILITOT 0.5  PROT 8.0  ALBUMIN 3.4*   No results for input(s): LIPASE, AMYLASE in the last 168 hours. No results for input(s): AMMONIA in the last 168 hours. CBC: Recent Labs  Lab 02/11/20 1646  WBC 11.4*  HGB 13.7  HCT 45.2  MCV 97.4  PLT 211   Cardiac Enzymes: No results for input(s): CKTOTAL, CKMB, CKMBINDEX, TROPONINI in the last 168 hours. BNP: Invalid input(s): POCBNP CBG: No results for  input(s): GLUCAP in the last 168 hours. D-Dimer No results for input(s): DDIMER in the last 72 hours. Hgb A1c No results for input(s): HGBA1C in the last 72 hours. Lipid Profile No results for input(s): CHOL, HDL, LDLCALC, TRIG, CHOLHDL, LDLDIRECT in the last 72 hours. Thyroid function studies No results for input(s): TSH, T4TOTAL, T3FREE, THYROIDAB in the last 72 hours.  Invalid input(s): FREET3 Anemia work up No results for input(s): VITAMINB12, FOLATE, FERRITIN, TIBC, IRON, RETICCTPCT in the last 72 hours. Urinalysis    Component Value Date/Time   COLORURINE AMBER (A) 02/11/2020 1950   APPEARANCEUR HAZY (A) 02/11/2020 1950   LABSPEC 1.025 02/11/2020 1950   PHURINE 5.0 02/11/2020 1950   GLUCOSEU NEGATIVE 02/11/2020 1950   [place] NEGATIVE 02/11/2020 [place] NEGATIVE 02/11/2020 1950   KETONESUR 5 (A) 02/11/2020 1950   PROTEINUR 30 (A) 02/11/2020 1950   NITRITE NEGATIVE 02/11/2020 1950   LEUKOCYTESUR TRACE (A) 02/11/2020 1950   Sepsis Labs Invalid input(s): PROCALCITONIN,  WBC,  LACTICIDVEN Microbiology Recent Results (from the past 240 hour(s))  Resp Panel by RT PCR (RSV, Flu A&B, Covid) - Nasopharyngeal Swab     Status: None   Collection Time: 02/11/20  7:47 PM   Specimen: Nasopharyngeal Swab  Result Value  Ref Range Status   SARS Coronavirus 2 by RT PCR NEGATIVE NEGATIVE Final    Comment: (NOTE) SARS-CoV-2 target nucleic acids are NOT DETECTED.  The SARS-CoV-2 RNA is generally detectable in upper respiratoy specimens during the acute phase of infection. The lowest concentration of SARS-CoV-2 viral copies this assay can detect is 131 copies/mL. A negative result does not preclude SARS-Cov-2 infection and should not be used as the sole basis for treatment or other patient management decisions. A negative result may occur with  improper specimen collection/handling, submission of specimen other than nasopharyngeal swab, presence of viral mutation(s) within the areas targeted by this assay, and inadequate number of viral copies (<131 copies/mL). A negative result must be combined with clinical observations, patient history, and epidemiological information. The expected result is Negative.  Fact Sheet for Patients:  PinkCheek.be  Fact Sheet for Healthcare Providers:  GravelBags.it  This test is no t yet approved or cleared by the [place] FDA and  has been authorized for detection and/or diagnosis of SARS-CoV-2 by FDA under an Emergency Use Authorization (EUA). This EUA will remain  in effect (meaning this test can be used) for the duration of the COVID-19 declaration under Section 564(b)(1) of the Act, 21 U.S.C. section 360bbb-3(b)(1), unless the authorization is terminated or revoked sooner.     Influenza A by PCR NEGATIVE NEGATIVE Final   Influenza B by PCR NEGATIVE NEGATIVE Final    Comment: (NOTE) The Xpert Xpress SARS-CoV-2/FLU/RSV assay is intended as an aid in  the diagnosis of influenza from Nasopharyngeal swab specimens and  should not be used as a sole basis for treatment. Nasal washings and  aspirates are unacceptable for Xpert Xpress SARS-CoV-2/FLU/RSV  testing.  Fact Sheet for  Patients: PinkCheek.be  Fact Sheet for Healthcare Providers: GravelBags.it  This test is not yet approved or cleared by the [place] FDA and  has been authorized for detection and/or diagnosis of SARS-CoV-2 by  FDA under an Emergency Use Authorization (EUA). This EUA will remain  in effect (meaning this test can be used) for the duration of the  Covid-19 declaration under Section 564(b)(1) of the Act, 21  U.S.C. section 360bbb-3(b)(1), unless the authorization is  terminated or revoked.  Respiratory Syncytial Virus by PCR NEGATIVE NEGATIVE Final    Comment: (NOTE) Fact Sheet for Patients: PinkCheek.be  Fact Sheet for Healthcare Providers: GravelBags.it  This test is not yet approved or cleared by the [place] FDA and  has been authorized for detection and/or diagnosis of SARS-CoV-2 by  FDA under an Emergency Use Authorization (EUA). This EUA will remain  in effect (meaning this test can be used) for the duration of the  COVID-19 declaration under Section 564(b)(1) of the Act, 21 U.S.C.  section 360bbb-3(b)(1), unless the authorization is terminated or  revoked. Performed at [facility], [place] [address], [place], [place] [address]   Resp Panel by RT-PCR (Flu A&B, Covid) Nasopharyngeal Swab     Status: None   Collection Time: 02/17/20 12:38 PM   Specimen: Nasopharyngeal Swab; Nasopharyngeal(NP) swabs in vial transport medium  Result Value Ref Range Status   SARS Coronavirus 2 by RT PCR NEGATIVE NEGATIVE Final    Comment: (NOTE) SARS-CoV-2 target nucleic acids are NOT DETECTED.  The SARS-CoV-2 RNA is generally detectable in upper respiratory specimens during the acute phase of infection. The lowest concentration of SARS-CoV-2 viral copies this assay can detect is 138 copies/mL. A negative result does not preclude SARS-Cov-2 infection  and should not be used as the sole basis for treatment or other patient management decisions. A negative result may occur with  improper specimen collection/handling, submission of specimen other than nasopharyngeal swab, presence of viral mutation(s) within the areas targeted by this assay, and inadequate number of viral copies(<138 copies/mL). A negative result must be combined with clinical observations, patient history, and epidemiological information. The expected result is Negative.  Fact Sheet for Patients:  EntrepreneurPulse.com.au  Fact Sheet for Healthcare Providers:  IncredibleEmployment.be  This test is no t yet approved or cleared by the [place] FDA and  has been authorized for detection and/or diagnosis of SARS-CoV-2 by FDA under an Emergency Use Authorization (EUA). This EUA will remain  in effect (meaning this test can be used) for the duration of the COVID-19 declaration under Section 564(b)(1) of the Act, 21 U.S.C.section 360bbb-3(b)(1), unless the authorization is terminated  or revoked sooner.       Influenza A by PCR NEGATIVE NEGATIVE Final   Influenza B by PCR NEGATIVE NEGATIVE Final    Comment: (NOTE) The Xpert Xpress SARS-CoV-2/FLU/RSV plus assay is intended as an aid in the diagnosis of influenza from Nasopharyngeal swab specimens and should not be used as a sole basis for treatment. Nasal washings and aspirates are unacceptable for Xpert Xpress SARS-CoV-2/FLU/RSV testing.  Fact Sheet for Patients: EntrepreneurPulse.com.au  Fact Sheet for Healthcare Providers: IncredibleEmployment.be  This test is not yet approved or cleared by the [place] FDA and has been authorized for detection and/or diagnosis of SARS-CoV-2 by FDA under an Emergency Use Authorization (EUA). This EUA will remain in effect (meaning this test can be used) for the duration of the COVID-19 declaration  under Section 564(b)(1) of the Act, 21 U.S.C. section 360bbb-3(b)(1), unless the authorization is terminated or revoked.  Performed at [facility], [place] [address], [place],  [address]     Please note: You were cared for by a hospitalist during your hospital stay. Once you are discharged, your primary care physician will handle any further medical issues. Please note that NO REFILLS for any discharge medications will be authorized once you are discharged, as it is imperative that you return to your primary care physician (or establish a  relationship with a primary care physician if you do not have one) for your post hospital discharge needs so that they can reassess your need for medications and monitor your lab values.    Time coordinating discharge: 40 minutes  SIGNED:   Shelly Coss, MD  [facility] 02/18/2020, 9:55 AM Pager [phone]  If 7PM-7AM, please contact night-coverage [email or web address] Password [email or web address]

## 2020-02-18 NOTE — Care Management Important Message (Signed)
Important Message  Patient Details IM Letter given to the Patient. Name: [name] MRN: [id number] Date of Birth: 1928/07/02   Medicare Important Message Given:  Yes     Kerin Salen 02/18/2020, 11:24 AM

## 2020-02-18 NOTE — Progress Notes (Signed)
Chaplain offered visit to patient while rounding on floor. Patient brightly accepted offer saying she was a [place] and would enjoy talking to a chaplain. Chaplain heard story of patient's life-growing up in [place], in a [employer] and then deciding later in life to become Pentecostal, driving 2 hours to a special church where she and her husband discovered "their people."  Patient is active reader. Her daughter  And granddaughter live with her. Her son also lives nearby and is a Insurance underwriter. Chaplain offered prayer for patient before departing.  Rev. Tamsen Snider Pager [phone]

## 2020-02-18 NOTE — Progress Notes (Signed)
Pt to be discharged to [facility]. Report called to this facility and Earleen Newport RN accepting report for this facility. VSS and no acute changes noted with Pt's assessment at time of discharge

## 2020-02-18 NOTE — TOC Transition Note (Addendum)
Transition of Care [facility]) - CM/SW Discharge Note   Patient Details  Name: [name] MRN: [id number] Date of Birth: February 18, 1929  Transition of Care [facility]) CM/SW Contact:  Dessa Phi, RN Phone Number: 02/18/2020, 10:13 AM   Clinical Narrative: d/c today to [facility] nsg call report to [phone],awaiting rm#. PTAR for transport.   Received rm#[id number], unable to reach dtr [name] tel # cuts off. PTAR called.No further CM needs.   Final next level of care: Skilled Nursing Facility Barriers to Discharge: No Barriers Identified   Patient Goals and CMS Choice Patient states their goals for this hospitalization and ongoing recovery are:: go to rehab CMS Medicare.gov Compare Post Acute Care list provided to:: Patient Represenative (must comment) Choice offered to / list presented to : Patient, Adult Children  Discharge Placement PASRR number recieved: 02/15/20            Patient chooses bed at: [facility] Patient to be transferred to facility by: [facility] Name of family member notified: [name] dtr [phone] Patient and family notified of of transfer: 02/18/20  Discharge Plan and Services   Discharge Planning Services: CM Consult Post Acute Care Choice: [place]                               Social Determinants of Health (SDOH) Interventions     Readmission Risk Interventions No flowsheet data found.

## 2020-02-20 DIAGNOSIS — I509 Heart failure, unspecified: Secondary | ICD-10-CM | POA: Diagnosis not present

## 2020-02-20 DIAGNOSIS — J449 Chronic obstructive pulmonary disease, unspecified: Secondary | ICD-10-CM | POA: Diagnosis not present

## 2020-02-20 DIAGNOSIS — K5900 Constipation, unspecified: Secondary | ICD-10-CM | POA: Diagnosis not present

## 2020-02-20 DIAGNOSIS — I10 Essential (primary) hypertension: Secondary | ICD-10-CM | POA: Diagnosis not present

## 2020-02-27 DIAGNOSIS — S82892D Other fracture of left lower leg, subsequent encounter for closed fracture with routine healing: Secondary | ICD-10-CM | POA: Diagnosis not present

## 2020-02-27 DIAGNOSIS — J449 Chronic obstructive pulmonary disease, unspecified: Secondary | ICD-10-CM | POA: Diagnosis not present

## 2020-02-27 DIAGNOSIS — S92001A Unspecified fracture of right calcaneus, initial encounter for closed fracture: Secondary | ICD-10-CM | POA: Diagnosis not present

## 2020-02-27 DIAGNOSIS — D649 Anemia, unspecified: Secondary | ICD-10-CM | POA: Diagnosis not present

## 2020-02-27 DIAGNOSIS — S82842A Displaced bimalleolar fracture of left lower leg, initial encounter for closed fracture: Secondary | ICD-10-CM | POA: Diagnosis not present

## 2020-02-27 DIAGNOSIS — J189 Pneumonia, unspecified organism: Secondary | ICD-10-CM | POA: Diagnosis not present

## 2020-02-28 DIAGNOSIS — R0602 Shortness of breath: Secondary | ICD-10-CM | POA: Diagnosis not present

## 2020-02-28 DIAGNOSIS — I10 Essential (primary) hypertension: Secondary | ICD-10-CM | POA: Diagnosis not present

## 2020-02-28 DIAGNOSIS — D649 Anemia, unspecified: Secondary | ICD-10-CM | POA: Diagnosis not present

## 2020-02-28 DIAGNOSIS — M810 Age-related osteoporosis without current pathological fracture: Secondary | ICD-10-CM | POA: Diagnosis not present

## 2020-02-28 DIAGNOSIS — J189 Pneumonia, unspecified organism: Secondary | ICD-10-CM | POA: Diagnosis not present

## 2020-02-28 DIAGNOSIS — J449 Chronic obstructive pulmonary disease, unspecified: Secondary | ICD-10-CM | POA: Diagnosis not present

## 2020-03-04 ENCOUNTER — Other Ambulatory Visit: Payer: Self-pay | Admitting: *Deleted

## 2020-03-04 NOTE — Patient Outreach (Signed)
[facility] Post- Acute Care Coordinator follow up. Member screened for potential [facility] Care Management needs as a benefit of [place] Medicare.  [name] resides in [facility]. Update from SNF SW indicating member's transition plan is to return home. Continues to receive therapy.   Will continue to follow for transition plans and potential [facility] Care Management needs while member resides in SNF.   Katie Rolling, MSN, RN,BSN [place] Acute Care Coordinator [phone] [facility]) [phone]  (Toll free office)

## 2020-03-05 DIAGNOSIS — K5900 Constipation, unspecified: Secondary | ICD-10-CM | POA: Diagnosis not present

## 2020-03-05 DIAGNOSIS — I10 Essential (primary) hypertension: Secondary | ICD-10-CM | POA: Diagnosis not present

## 2020-03-05 DIAGNOSIS — J449 Chronic obstructive pulmonary disease, unspecified: Secondary | ICD-10-CM | POA: Diagnosis not present

## 2020-03-05 DIAGNOSIS — R062 Wheezing: Secondary | ICD-10-CM | POA: Diagnosis not present

## 2020-03-05 DIAGNOSIS — D649 Anemia, unspecified: Secondary | ICD-10-CM | POA: Diagnosis not present

## 2020-03-05 DIAGNOSIS — J189 Pneumonia, unspecified organism: Secondary | ICD-10-CM | POA: Diagnosis not present

## 2020-03-05 DIAGNOSIS — E039 Hypothyroidism, unspecified: Secondary | ICD-10-CM | POA: Diagnosis not present

## 2020-03-05 NOTE — Patient Outreach (Signed)
[facility] Post- Acute care Coordinator follow up. Member screened for potential [facility] Care Management needs as a benefit of [place] Medicare.  Communication sent to [facility] SNF SW to inquire about transition plans and potential [facility] Care Management needs.   Will continue to follow while member resides in SNF.   Marthenia Rolling, MSN, RN,BSN [place] Acute Care Coordinator [phone] [facility]) [phone]  (Toll free office)

## 2020-03-10 DIAGNOSIS — J189 Pneumonia, unspecified organism: Secondary | ICD-10-CM | POA: Diagnosis not present

## 2020-03-10 DIAGNOSIS — I10 Essential (primary) hypertension: Secondary | ICD-10-CM | POA: Diagnosis not present

## 2020-03-10 DIAGNOSIS — J449 Chronic obstructive pulmonary disease, unspecified: Secondary | ICD-10-CM | POA: Diagnosis not present

## 2020-03-10 DIAGNOSIS — K5900 Constipation, unspecified: Secondary | ICD-10-CM | POA: Diagnosis not present

## 2020-03-10 DIAGNOSIS — M810 Age-related osteoporosis without current pathological fracture: Secondary | ICD-10-CM | POA: Diagnosis not present

## 2020-03-10 DIAGNOSIS — D649 Anemia, unspecified: Secondary | ICD-10-CM | POA: Diagnosis not present

## 2020-03-10 DIAGNOSIS — I509 Heart failure, unspecified: Secondary | ICD-10-CM | POA: Diagnosis not present

## 2020-03-19 DIAGNOSIS — G629 Polyneuropathy, unspecified: Secondary | ICD-10-CM | POA: Diagnosis not present

## 2020-03-19 DIAGNOSIS — M810 Age-related osteoporosis without current pathological fracture: Secondary | ICD-10-CM | POA: Diagnosis not present

## 2020-03-19 DIAGNOSIS — J449 Chronic obstructive pulmonary disease, unspecified: Secondary | ICD-10-CM | POA: Diagnosis not present

## 2020-03-19 DIAGNOSIS — D649 Anemia, unspecified: Secondary | ICD-10-CM | POA: Diagnosis not present

## 2020-03-19 DIAGNOSIS — G4700 Insomnia, unspecified: Secondary | ICD-10-CM | POA: Diagnosis not present

## 2020-03-19 DIAGNOSIS — I10 Essential (primary) hypertension: Secondary | ICD-10-CM | POA: Diagnosis not present

## 2020-03-19 DIAGNOSIS — I509 Heart failure, unspecified: Secondary | ICD-10-CM | POA: Diagnosis not present

## 2020-03-19 DIAGNOSIS — E559 Vitamin D deficiency, unspecified: Secondary | ICD-10-CM | POA: Diagnosis not present

## 2020-03-19 DIAGNOSIS — E039 Hypothyroidism, unspecified: Secondary | ICD-10-CM | POA: Diagnosis not present

## 2020-03-19 DIAGNOSIS — K5900 Constipation, unspecified: Secondary | ICD-10-CM | POA: Diagnosis not present

## 2020-03-19 DIAGNOSIS — G253 Myoclonus: Secondary | ICD-10-CM | POA: Diagnosis not present

## 2020-03-20 ENCOUNTER — Other Ambulatory Visit: Payer: Self-pay

## 2020-03-20 MED ORDER — CARVEDILOL 12.5 MG PO TABS
12.5000 mg | ORAL_TABLET | Freq: Two times a day (BID) | ORAL | 3 refills | Status: DC
Start: 2020-03-20 — End: 2020-08-04

## 2020-03-26 DIAGNOSIS — J449 Chronic obstructive pulmonary disease, unspecified: Secondary | ICD-10-CM | POA: Diagnosis not present

## 2020-03-26 DIAGNOSIS — R5381 Other malaise: Secondary | ICD-10-CM | POA: Diagnosis not present

## 2020-03-26 DIAGNOSIS — E559 Vitamin D deficiency, unspecified: Secondary | ICD-10-CM | POA: Diagnosis not present

## 2020-03-26 DIAGNOSIS — I509 Heart failure, unspecified: Secondary | ICD-10-CM | POA: Diagnosis not present

## 2020-03-26 DIAGNOSIS — S82842A Displaced bimalleolar fracture of left lower leg, initial encounter for closed fracture: Secondary | ICD-10-CM | POA: Diagnosis not present

## 2020-03-26 DIAGNOSIS — D649 Anemia, unspecified: Secondary | ICD-10-CM | POA: Diagnosis not present

## 2020-03-26 DIAGNOSIS — S92001A Unspecified fracture of right calcaneus, initial encounter for closed fracture: Secondary | ICD-10-CM | POA: Diagnosis not present

## 2020-03-26 DIAGNOSIS — G250 Essential tremor: Secondary | ICD-10-CM | POA: Diagnosis not present

## 2020-03-26 DIAGNOSIS — F32A Depression, unspecified: Secondary | ICD-10-CM | POA: Diagnosis not present

## 2020-03-26 DIAGNOSIS — K5900 Constipation, unspecified: Secondary | ICD-10-CM | POA: Diagnosis not present

## 2020-03-26 DIAGNOSIS — G629 Polyneuropathy, unspecified: Secondary | ICD-10-CM | POA: Diagnosis not present

## 2020-03-27 DIAGNOSIS — R5381 Other malaise: Secondary | ICD-10-CM | POA: Diagnosis not present

## 2020-03-27 DIAGNOSIS — J449 Chronic obstructive pulmonary disease, unspecified: Secondary | ICD-10-CM | POA: Diagnosis not present

## 2020-03-27 DIAGNOSIS — F32A Depression, unspecified: Secondary | ICD-10-CM | POA: Diagnosis not present

## 2020-03-27 DIAGNOSIS — I10 Essential (primary) hypertension: Secondary | ICD-10-CM | POA: Diagnosis not present

## 2020-04-03 ENCOUNTER — Other Ambulatory Visit: Payer: Self-pay | Admitting: Cardiovascular Disease

## 2020-04-07 DIAGNOSIS — R062 Wheezing: Secondary | ICD-10-CM | POA: Diagnosis not present

## 2020-04-07 DIAGNOSIS — G4700 Insomnia, unspecified: Secondary | ICD-10-CM | POA: Diagnosis not present

## 2020-04-07 DIAGNOSIS — I509 Heart failure, unspecified: Secondary | ICD-10-CM | POA: Diagnosis not present

## 2020-04-07 DIAGNOSIS — D649 Anemia, unspecified: Secondary | ICD-10-CM | POA: Diagnosis not present

## 2020-04-07 DIAGNOSIS — E039 Hypothyroidism, unspecified: Secondary | ICD-10-CM | POA: Diagnosis not present

## 2020-04-07 DIAGNOSIS — I10 Essential (primary) hypertension: Secondary | ICD-10-CM | POA: Diagnosis not present

## 2020-04-07 DIAGNOSIS — K5900 Constipation, unspecified: Secondary | ICD-10-CM | POA: Diagnosis not present

## 2020-04-07 DIAGNOSIS — E559 Vitamin D deficiency, unspecified: Secondary | ICD-10-CM | POA: Diagnosis not present

## 2020-04-07 DIAGNOSIS — J449 Chronic obstructive pulmonary disease, unspecified: Secondary | ICD-10-CM | POA: Diagnosis not present

## 2020-04-17 ENCOUNTER — Other Ambulatory Visit: Payer: Self-pay | Admitting: Internal Medicine

## 2020-04-21 DIAGNOSIS — I10 Essential (primary) hypertension: Secondary | ICD-10-CM | POA: Diagnosis not present

## 2020-04-21 DIAGNOSIS — M810 Age-related osteoporosis without current pathological fracture: Secondary | ICD-10-CM | POA: Diagnosis not present

## 2020-04-21 DIAGNOSIS — G4700 Insomnia, unspecified: Secondary | ICD-10-CM | POA: Diagnosis not present

## 2020-04-21 DIAGNOSIS — D649 Anemia, unspecified: Secondary | ICD-10-CM | POA: Diagnosis not present

## 2020-04-21 DIAGNOSIS — E039 Hypothyroidism, unspecified: Secondary | ICD-10-CM | POA: Diagnosis not present

## 2020-04-21 DIAGNOSIS — E559 Vitamin D deficiency, unspecified: Secondary | ICD-10-CM | POA: Diagnosis not present

## 2020-04-21 DIAGNOSIS — K5900 Constipation, unspecified: Secondary | ICD-10-CM | POA: Diagnosis not present

## 2020-04-23 DIAGNOSIS — R5381 Other malaise: Secondary | ICD-10-CM | POA: Diagnosis not present

## 2020-04-23 DIAGNOSIS — G4700 Insomnia, unspecified: Secondary | ICD-10-CM | POA: Diagnosis not present

## 2020-04-23 DIAGNOSIS — D649 Anemia, unspecified: Secondary | ICD-10-CM | POA: Diagnosis not present

## 2020-04-23 DIAGNOSIS — M810 Age-related osteoporosis without current pathological fracture: Secondary | ICD-10-CM | POA: Diagnosis not present

## 2020-04-23 DIAGNOSIS — E039 Hypothyroidism, unspecified: Secondary | ICD-10-CM | POA: Diagnosis not present

## 2020-04-23 DIAGNOSIS — G629 Polyneuropathy, unspecified: Secondary | ICD-10-CM | POA: Diagnosis not present

## 2020-04-23 DIAGNOSIS — J449 Chronic obstructive pulmonary disease, unspecified: Secondary | ICD-10-CM | POA: Diagnosis not present

## 2020-04-23 DIAGNOSIS — S82842A Displaced bimalleolar fracture of left lower leg, initial encounter for closed fracture: Secondary | ICD-10-CM | POA: Diagnosis not present

## 2020-04-23 DIAGNOSIS — I509 Heart failure, unspecified: Secondary | ICD-10-CM | POA: Diagnosis not present

## 2020-04-23 DIAGNOSIS — I10 Essential (primary) hypertension: Secondary | ICD-10-CM | POA: Diagnosis not present

## 2020-04-23 DIAGNOSIS — S92001A Unspecified fracture of right calcaneus, initial encounter for closed fracture: Secondary | ICD-10-CM | POA: Diagnosis not present

## 2020-04-23 DIAGNOSIS — E559 Vitamin D deficiency, unspecified: Secondary | ICD-10-CM | POA: Diagnosis not present

## 2020-04-23 DIAGNOSIS — K649 Unspecified hemorrhoids: Secondary | ICD-10-CM | POA: Diagnosis not present

## 2020-04-24 DIAGNOSIS — J449 Chronic obstructive pulmonary disease, unspecified: Secondary | ICD-10-CM | POA: Diagnosis not present

## 2020-04-24 DIAGNOSIS — D649 Anemia, unspecified: Secondary | ICD-10-CM | POA: Diagnosis not present

## 2020-04-24 DIAGNOSIS — I10 Essential (primary) hypertension: Secondary | ICD-10-CM | POA: Diagnosis not present

## 2020-04-24 DIAGNOSIS — E559 Vitamin D deficiency, unspecified: Secondary | ICD-10-CM | POA: Diagnosis not present

## 2020-04-24 DIAGNOSIS — I509 Heart failure, unspecified: Secondary | ICD-10-CM | POA: Diagnosis not present

## 2020-04-24 DIAGNOSIS — R52 Pain, unspecified: Secondary | ICD-10-CM | POA: Diagnosis not present

## 2020-04-24 DIAGNOSIS — R5381 Other malaise: Secondary | ICD-10-CM | POA: Diagnosis not present

## 2020-04-28 DIAGNOSIS — J45909 Unspecified asthma, uncomplicated: Secondary | ICD-10-CM | POA: Diagnosis not present

## 2020-04-28 DIAGNOSIS — R296 Repeated falls: Secondary | ICD-10-CM | POA: Diagnosis not present

## 2020-04-28 DIAGNOSIS — R2681 Unsteadiness on feet: Secondary | ICD-10-CM | POA: Diagnosis not present

## 2020-04-28 DIAGNOSIS — I5042 Chronic combined systolic (congestive) and diastolic (congestive) heart failure: Secondary | ICD-10-CM | POA: Diagnosis not present

## 2020-04-28 DIAGNOSIS — G894 Chronic pain syndrome: Secondary | ICD-10-CM | POA: Diagnosis not present

## 2020-04-28 DIAGNOSIS — G620 Drug-induced polyneuropathy: Secondary | ICD-10-CM | POA: Diagnosis not present

## 2020-04-28 DIAGNOSIS — I10 Essential (primary) hypertension: Secondary | ICD-10-CM | POA: Diagnosis not present

## 2020-04-28 DIAGNOSIS — M159 Polyosteoarthritis, unspecified: Secondary | ICD-10-CM | POA: Diagnosis not present

## 2020-04-28 DIAGNOSIS — M2550 Pain in unspecified joint: Secondary | ICD-10-CM | POA: Diagnosis not present

## 2020-05-06 DIAGNOSIS — Z7982 Long term (current) use of aspirin: Secondary | ICD-10-CM | POA: Diagnosis not present

## 2020-05-06 DIAGNOSIS — D649 Anemia, unspecified: Secondary | ICD-10-CM | POA: Diagnosis not present

## 2020-05-06 DIAGNOSIS — J189 Pneumonia, unspecified organism: Secondary | ICD-10-CM | POA: Diagnosis not present

## 2020-05-06 DIAGNOSIS — I5033 Acute on chronic diastolic (congestive) heart failure: Secondary | ICD-10-CM | POA: Diagnosis not present

## 2020-05-06 DIAGNOSIS — G629 Polyneuropathy, unspecified: Secondary | ICD-10-CM | POA: Diagnosis not present

## 2020-05-06 DIAGNOSIS — G253 Myoclonus: Secondary | ICD-10-CM | POA: Diagnosis not present

## 2020-05-06 DIAGNOSIS — F0280 Dementia in other diseases classified elsewhere without behavioral disturbance: Secondary | ICD-10-CM | POA: Diagnosis not present

## 2020-05-06 DIAGNOSIS — M80071D Age-related osteoporosis with current pathological fracture, right ankle and foot, subsequent encounter for fracture with routine healing: Secondary | ICD-10-CM | POA: Diagnosis not present

## 2020-05-06 DIAGNOSIS — Z8543 Personal history of malignant neoplasm of ovary: Secondary | ICD-10-CM | POA: Diagnosis not present

## 2020-05-06 DIAGNOSIS — Z9181 History of falling: Secondary | ICD-10-CM | POA: Diagnosis not present

## 2020-05-06 DIAGNOSIS — F32A Depression, unspecified: Secondary | ICD-10-CM | POA: Diagnosis not present

## 2020-05-06 DIAGNOSIS — E039 Hypothyroidism, unspecified: Secondary | ICD-10-CM | POA: Diagnosis not present

## 2020-05-06 DIAGNOSIS — I110 Hypertensive heart disease with heart failure: Secondary | ICD-10-CM | POA: Diagnosis not present

## 2020-05-06 DIAGNOSIS — J440 Chronic obstructive pulmonary disease with acute lower respiratory infection: Secondary | ICD-10-CM | POA: Diagnosis not present

## 2020-05-06 DIAGNOSIS — M80072D Age-related osteoporosis with current pathological fracture, left ankle and foot, subsequent encounter for fracture with routine healing: Secondary | ICD-10-CM | POA: Diagnosis not present

## 2020-05-06 DIAGNOSIS — J9621 Acute and chronic respiratory failure with hypoxia: Secondary | ICD-10-CM | POA: Diagnosis not present

## 2020-05-06 DIAGNOSIS — Z95810 Presence of automatic (implantable) cardiac defibrillator: Secondary | ICD-10-CM | POA: Diagnosis not present

## 2020-05-16 ENCOUNTER — Emergency Department (HOSPITAL_COMMUNITY): Payer: Medicare Other

## 2020-05-16 ENCOUNTER — Other Ambulatory Visit: Payer: Self-pay

## 2020-05-16 ENCOUNTER — Encounter (HOSPITAL_COMMUNITY): Payer: Self-pay | Admitting: Emergency Medicine

## 2020-05-16 ENCOUNTER — Inpatient Hospital Stay (HOSPITAL_COMMUNITY)
Admission: EM | Admit: 2020-05-16 | Discharge: 2020-05-23 | DRG: 682 | Disposition: A | Discharge status: Home / Self Care | Payer: Medicare Other | Attending: Internal Medicine | Admitting: Internal Medicine

## 2020-05-16 DIAGNOSIS — N179 Acute kidney failure, unspecified: Principal | ICD-10-CM

## 2020-05-16 DIAGNOSIS — G894 Chronic pain syndrome: Secondary | ICD-10-CM | POA: Diagnosis present

## 2020-05-16 DIAGNOSIS — G9340 Encephalopathy, unspecified: Secondary | ICD-10-CM | POA: Diagnosis not present

## 2020-05-16 DIAGNOSIS — I428 Other cardiomyopathies: Secondary | ICD-10-CM | POA: Diagnosis present

## 2020-05-16 DIAGNOSIS — E538 Deficiency of other specified B group vitamins: Secondary | ICD-10-CM | POA: Diagnosis present

## 2020-05-16 DIAGNOSIS — I5032 Chronic diastolic (congestive) heart failure: Secondary | ICD-10-CM | POA: Diagnosis present

## 2020-05-16 DIAGNOSIS — Z743 Need for continuous supervision: Secondary | ICD-10-CM | POA: Diagnosis not present

## 2020-05-16 DIAGNOSIS — Y92009 Unspecified place in unspecified non-institutional (private) residence as the place of occurrence of the external cause: Secondary | ICD-10-CM

## 2020-05-16 DIAGNOSIS — I110 Hypertensive heart disease with heart failure: Secondary | ICD-10-CM | POA: Diagnosis present

## 2020-05-16 DIAGNOSIS — M879 Osteonecrosis, unspecified: Secondary | ICD-10-CM | POA: Diagnosis present

## 2020-05-16 DIAGNOSIS — I4891 Unspecified atrial fibrillation: Secondary | ICD-10-CM | POA: Diagnosis not present

## 2020-05-16 DIAGNOSIS — Z66 Do not resuscitate: Secondary | ICD-10-CM | POA: Diagnosis present

## 2020-05-16 DIAGNOSIS — K5909 Other constipation: Secondary | ICD-10-CM | POA: Diagnosis present

## 2020-05-16 DIAGNOSIS — F0390 Unspecified dementia without behavioral disturbance: Secondary | ICD-10-CM | POA: Diagnosis present

## 2020-05-16 DIAGNOSIS — M47812 Spondylosis without myelopathy or radiculopathy, cervical region: Secondary | ICD-10-CM | POA: Diagnosis present

## 2020-05-16 DIAGNOSIS — G9341 Metabolic encephalopathy: Secondary | ICD-10-CM | POA: Diagnosis present

## 2020-05-16 DIAGNOSIS — E039 Hypothyroidism, unspecified: Secondary | ICD-10-CM | POA: Diagnosis present

## 2020-05-16 DIAGNOSIS — R269 Unspecified abnormalities of gait and mobility: Secondary | ICD-10-CM | POA: Diagnosis present

## 2020-05-16 DIAGNOSIS — Z8249 Family history of ischemic heart disease and other diseases of the circulatory system: Secondary | ICD-10-CM | POA: Diagnosis not present

## 2020-05-16 DIAGNOSIS — R296 Repeated falls: Secondary | ICD-10-CM | POA: Diagnosis present

## 2020-05-16 DIAGNOSIS — Z9181 History of falling: Secondary | ICD-10-CM | POA: Diagnosis not present

## 2020-05-16 DIAGNOSIS — Z95810 Presence of automatic (implantable) cardiac defibrillator: Secondary | ICD-10-CM

## 2020-05-16 DIAGNOSIS — W19XXXA Unspecified fall, initial encounter: Secondary | ICD-10-CM | POA: Diagnosis present

## 2020-05-16 DIAGNOSIS — M542 Cervicalgia: Secondary | ICD-10-CM | POA: Diagnosis not present

## 2020-05-16 DIAGNOSIS — E860 Dehydration: Secondary | ICD-10-CM | POA: Diagnosis present

## 2020-05-16 DIAGNOSIS — R339 Retention of urine, unspecified: Secondary | ICD-10-CM

## 2020-05-16 DIAGNOSIS — Z20822 Contact with and (suspected) exposure to covid-19: Secondary | ICD-10-CM | POA: Diagnosis present

## 2020-05-16 DIAGNOSIS — J449 Chronic obstructive pulmonary disease, unspecified: Secondary | ICD-10-CM | POA: Diagnosis present

## 2020-05-16 DIAGNOSIS — I447 Left bundle-branch block, unspecified: Secondary | ICD-10-CM | POA: Diagnosis not present

## 2020-05-16 DIAGNOSIS — D7589 Other specified diseases of blood and blood-forming organs: Secondary | ICD-10-CM

## 2020-05-16 DIAGNOSIS — M79671 Pain in right foot: Secondary | ICD-10-CM | POA: Diagnosis not present

## 2020-05-16 DIAGNOSIS — M25572 Pain in left ankle and joints of left foot: Secondary | ICD-10-CM | POA: Diagnosis not present

## 2020-05-16 DIAGNOSIS — Z8543 Personal history of malignant neoplasm of ovary: Secondary | ICD-10-CM | POA: Diagnosis not present

## 2020-05-16 DIAGNOSIS — E785 Hyperlipidemia, unspecified: Secondary | ICD-10-CM | POA: Diagnosis present

## 2020-05-16 DIAGNOSIS — Z043 Encounter for examination and observation following other accident: Secondary | ICD-10-CM | POA: Diagnosis not present

## 2020-05-16 DIAGNOSIS — R279 Unspecified lack of coordination: Secondary | ICD-10-CM | POA: Diagnosis not present

## 2020-05-16 DIAGNOSIS — I10 Essential (primary) hypertension: Secondary | ICD-10-CM | POA: Diagnosis not present

## 2020-05-16 DIAGNOSIS — M25551 Pain in right hip: Secondary | ICD-10-CM | POA: Diagnosis not present

## 2020-05-16 MED ORDER — ACETAMINOPHEN 325 MG PO TABS
650.0000 mg | ORAL_TABLET | Freq: Once | ORAL | Status: AC
  Administered 2020-05-16: 650 mg via ORAL
  Filled 2020-05-16: qty 2

## 2020-05-16 NOTE — ED Notes (Signed)
Patient transported to X-ray and CT 

## 2020-05-16 NOTE — ED Triage Notes (Addendum)
Pt was d/cd from facility [facility]) on 1/31. Has been living at home with family. Was doing well until 2 days ago when she started falling. States that she has fallen 3 times today. R foot is swollen. Alert and confused at baseline.

## 2020-05-16 NOTE — ED Provider Notes (Signed)
[facility] DEPT Provider Note   CSN: [id number] Arrival date & time: 05/16/20  2129     History Chief Complaint  Patient presents with  . Fall    [name] is a 87 y.o. female with history of AICD, chronic diastolic congestive heart failure, chronic respiratory failure on 2 L home O2, COPD, HTN who presents to the emergency department from home with a chief complaint of falls.  Per EMS, patient was discharged from [facility] on January 31 and has since been living at home with family.  She was doing well until 2 days ago when she started falling.  She has fallen three times today.  In the ER, patient is oriented to person only.  She is endorsing upper back and bilateral shoulder pain as well as left ankle and right foot pain.  Level five caveat secondary to altered mental status.   The history is provided by the patient, the EMS personnel and medical records. No language interpreter was used.       Past Medical History:  Diagnosis Date  . AICD (automatic cardioverter/defibrillator) present 06/27/2007   medtronic  . ARF (acute renal failure) ([place]) 09/2017  . Cancer ([place])    ovarian--stage 4  . Cardiomyopathy ([place])   . CHF (congestive heart failure) ([place])   . COPD (chronic obstructive pulmonary disease) ([place])   . Dyspnea   . Hypothyroidism   . Peripheral neuropathy   . Systemic hypertension   . Thyroid disease     Patient Active Problem List   Diagnosis Date Noted  . Acute on chronic heart failure with preserved ejection fraction (HFpEF) ([place]) 02/11/2020  . Essential hypertension 02/11/2020  . Fracture of left ankle, closed, initial encounter 02/11/2020  . Pacemaker 02/11/2020  . Fall at home, initial encounter 02/11/2020  . Prolonged QT interval 02/11/2020  . ICD (implantable cardioverter-defibrillator) battery depletion   . Pacemaker battery depletion 09/09/2019  . Acute respiratory failure ([place]) 03/15/2019  . COPD  exacerbation ([place]) 03/15/2019  . Acute respiratory failure with hypoxia ([place]) 03/14/2019  . COPD with acute exacerbation ([place]) 03/14/2019  . Digoxin toxicity 10/04/2017  . Closed fracture of distal end of radius 06/29/2017  . Acute encephalopathy   . AKI (acute kidney injury) ([place])   . Dysarthria 09/29/2015  . Abnormal liver function 09/29/2015  . Acute renal failure (ARF) ([place]) 09/29/2015  . Hyponatremia 09/29/2015  . Small bowel obstruction ([place]) 05/17/2015  . Hypothyroidism 05/17/2015  . Uncontrolled hypertension 05/17/2015  . Hypocalcemia 05/17/2015  . Leukocytosis 05/17/2015  . Paroxysmal atrial flutter ([place]) 04/24/2015  . Incarcerated incisional hernia 03/28/2015  . SBO (small bowel obstruction) ([place]) 03/28/2015  . Nonischemic cardiomyopathy ([place]) 11/27/2012  . Biventricular ICD  11/27/2012  . Chronic diastolic heart failure ([place]) 11/27/2012  . Peripheral neuropathy secondary to chemotherapy 11/27/2012  . Hyperlipidemia 11/27/2012  . COPD (chronic obstructive pulmonary disease) ([place]) 12/03/2011  . DOE (dyspnea on exertion) 11/05/2011    Past Surgical History:  Procedure Laterality Date  . ABDOMINAL HYSTERECTOMY  oct 2000   along with ovarian ca surg  . APPENDECTOMY    . BIV PACEMAKER GENERATOR CHANGEOUT N/A 09/10/2019   Procedure: BIV PACEMAKER GENERATOR CHANGEOUT;  Surgeon: Sanda Klein, MD;  Location: [facility];  Service: Cardiovascular;  Laterality: N/A;  . CARDIAC CATHETERIZATION  09/27/1990   normal coronaries,dilated cardiomyopathy  . CARDIAC DEFIBRILLATOR PLACEMENT  06/27/2007   medtronic concerto  . CHOLECYSTECTOMY    . IMPLANTABLE CARDIOVERTER DEFIBRILLATOR GENERATOR  CHANGE N/A 07/31/2013   Procedure: IMPLANTABLE CARDIOVERTER DEFIBRILLATOR GENERATOR CHANGE;  Surgeon: Sanda Klein, MD;  Location: [facility] CATH LAB;  Service: Cardiovascular;  Laterality: N/A;  . NM MYOCAR PERF WALL MOTION  06/05/2007   no ischemia     OB History    Gravida  2   Para       Term      Preterm      AB      Living  2     SAB      IAB      Ectopic      Multiple      Live Births              Family History  Problem Relation Age of Onset  . Lung cancer Mother        died from cancer  . Heart attack Father   . Lung cancer Father   . Heart disease Paternal Grandmother     Social History   Tobacco Use  . Smoking status: Never Smoker  . Smokeless tobacco: Never Used  Vaping Use  . Vaping Use: Never used  Substance Use Topics  . Alcohol use: No  . Drug use: No    Home Medications Prior to Admission medications   Medication Sig Start Date End Date Taking? Authorizing Provider  albuterol (VENTOLIN HFA) 108 (90 Base) MCG/ACT inhaler Inhale 1-2 puffs into the lungs every 6 (six) hours as needed for wheezing or shortness of breath.    [provider]  alendronate (FOSAMAX) 70 MG tablet Take 70 mg by mouth every Sunday.  09/01/15   [provider]  amLODipine (NORVASC) 5 MG tablet Take 1 tablet (5 mg total) by mouth daily. 01/29/20   Ledora Bottcher, PA  aspirin EC 81 MG tablet Take 1 tablet (81 mg total) by mouth daily. 10/03/17   Croitoru, Mihai, MD  carvedilol (COREG) 12.5 MG tablet Take 1 tablet (12.5 mg total) by mouth 2 (two) times daily with a meal. 03/20/20   Deboraha Sprang, MD  Cholecalciferol (VITAMIN D3 PO) Take 1 tablet by mouth once a week.     [provider]  dorzolamide-timolol (COSOPT) 22.3-6.8 MG/ML ophthalmic solution Place 1 drop into both eyes 2 (two) times daily. 02/18/20   Shelly Coss, MD  DULERA 100-5 MCG/ACT AERO Inhale 2 puffs into the lungs every 6 (six) hours as needed for wheezing or shortness of breath.  08/29/19   [provider]  DULoxetine (CYMBALTA) 60 MG capsule Take 60 mg by mouth daily.  08/25/15   [provider]  furosemide (LASIX) 20 MG tablet TAKE 2 TABLETS (40 MG TOTAL) BY MOUTH DAILY. 04/17/20   Deboraha Sprang, MD  gabapentin (NEURONTIN) 100 MG capsule  Take 100-300 mg by mouth at bedtime as needed (pain).     [provider]  latanoprost (XALATAN) 0.005 % ophthalmic solution Place 1 drop into both eyes at bedtime. 02/18/20   Shelly Coss, MD  liothyronine (CYTOMEL) 25 MCG tablet Take 25 mcg by mouth daily. 07/02/15   [provider]  Melatonin 10 MG TABS Take 10 mg by mouth at bedtime.     [provider]  Multiple Vitamin (MULTIVITAMIN WITH MINERALS) TABS tablet Take 1 tablet by mouth daily.    [provider]  oxyCODONE (ROXICODONE) 15 MG immediate release tablet Take 1 tablet (15 mg total) by mouth every 6 (six) hours as needed for pain. 02/17/20   Shelly Coss,  MD  polyethylene glycol (MIRALAX / GLYCOLAX) 17 g packet Take 17 g by mouth daily as needed. 02/18/20   Shelly Coss, MD  SYNTHROID 112 MCG tablet Take 112 mcg by mouth daily before breakfast.  09/12/15   [provider]  traZODone (DESYREL) 50 MG tablet Take 50 mg by mouth at bedtime as needed for sleep.  01/10/20   [provider]    Allergies    Patient has no known allergies.  Review of Systems   Review of Systems  Unable to perform ROS: Dementia    Physical Exam Updated Vital Signs BP (!) 152/76 (BP Location: Right Arm)   Pulse 84   Temp 97.7 F (36.5 C) (Oral)   Resp 18   Ht 5\' 2"  (1.575 m)   Wt 64.4 kg   SpO2 99%   BMI 25.97 kg/m   Physical Exam Vitals and nursing note reviewed.  Constitutional:      General: She is not in acute distress. HENT:     Head: Normocephalic.     Nose: Nose normal.  Eyes:     Conjunctiva/sclera: Conjunctivae normal.  Neck:     Comments: No JVD Cardiovascular:     Rate and Rhythm: Normal rate and regular rhythm.     Heart sounds: Murmur heard.  No friction rub. No gallop.   Pulmonary:     Effort: Pulmonary effort is normal. No respiratory distress.     Comments: Crackles in the bilateral bases Abdominal:     General: There is no distension.     Palpations:  Abdomen is soft.  Musculoskeletal:     Cervical back: Neck supple.     Right lower leg: Edema present.     Left lower leg: Edema present.     Comments: Non-pitting edema to the bilateral lower extremities.  2+ pitting edema noted to the bilateral feet.  Bilateral calves are nontender to palpation. There appears to be mild shortening to the right lower extremity with mild rotation and increased pain to the left hp with ROM.  No tenderness to the left lower extremity.  Skin:    General: Skin is warm.     Findings: No rash.     Comments: There is a wound noted to the left heel. Ecchymosis noted to the dorsum of the right hand.  Neurological:     Mental Status: She is alert.     Comments: Oriented to self only.  Sensation is intact and equal to the bilateral upper and lower extremities.  Psychiatric:        Behavior: Behavior normal.     ED Results / Procedures / Treatments   Labs (all labs ordered are listed, but only abnormal results are displayed) Labs Reviewed - No data to display  EKG None  Radiology No results found.  Procedures Procedures   Medications Ordered in ED Medications - No data to display  ED Course  I have reviewed the triage vital signs and the nursing notes.  Pertinent labs & imaging results that were available during my care of the patient were reviewed by me and considered in my medical decision making (see chart for details).  Clinical Course as of 05/17/20 3007  Fri May 16, 2020  2230 Attempted to call the patient's daughter, her emergency contact without success. VM left. [MM]    Clinical Course User Index [MM] McDonald, Laymond Purser, PA-C   MDM Rules/Calculators/A&P  87 year old female with history of AICD, chronic diastolic congestive heart failure, chronic respiratory failure on 2 L home O2, COPD, HTN brought in by EMS from home with a chief complaint of frequent falls over the last 3 days.  Mildly hypertensive.  Vital  signs are otherwise unremarkable.  No family is at bedside and patient has a history of dementia. Her only complaint is upper back and bilateral shoulder pain.  Labs and imaging have been reviewed and independently interpreted by me. The patient was discussed with Dr. Ron Parker, attending physician.   On exam, she does have mild volume overload with peripheral edema and crackles in the bilateral bases.  No JVD.  She is endorsing pain in her bilateral lower extremities and does have a history of a calcaneal and trimalleolar fracture, which have healed on repeat imaging.  In regards to her upper back pain, chest x-ray is unremarkable.  She does not have focal tenderness palpation to this spinous processes of the thoracic, lumbar, or cervical spine.  Full active and passive range of motion of the bilateral shoulders.  However, she does have shortening noted to the right lower extremity and pain with range of motion of the right hip.  X-ray of the right hip with signs of advanced avascular necrosis with subchondral collapse and secondary osteoarthritis of the right hip.  Radiology recommends further evaluation with MRI if patient refuses to weight-bear.  Patient also noted to have a new AKI.  She was given 500 cc fluid bolus in the ER.  She did not have obvious orthostatic hypotension with vital signs from laying to sitting, but patient was unable to tolerate standing for orthostatic vital signs.  UA with trace leukocyte esterase, but otherwise not concerning for infection.  Urine culture sent.  Notably, after 500 cc fluid bolus patient had not produced any urine out, and on bedside bladder scan demonstrating 510 cc.  Foley catheter was placed for urinary retention.  Because there is unclear etiology of falling episodes, EKG was obtained with no acute findings.  However, while patient was on cardiac monitor she did have a 5 beat run of ventricular tachycardia.  Printout obtained.  The patient's biventricular  Medtronic AICD was interrogated and appeared to be working appropriately.  No metabolic derangements.  Labs are otherwise reassuring.  Consult to the hospitalist team and Dr. Jeannine Kitten accept the patient for admission. The patient appears reasonably stabilized for admission considering the current resources, flow, and capabilities available in the ED at this time, and I doubt any other [facility] requiring further screening and/or treatment in the ED prior to admission.  Final Clinical Impression(s) / ED Diagnoses Final diagnoses:  None    Rx / DC Orders ED Discharge Orders    None       McDonald, Mia A, PA-C 05/17/20 9233    Breck Coons, MD 05/17/20 [phone]

## 2020-05-17 ENCOUNTER — Other Ambulatory Visit: Payer: Self-pay

## 2020-05-17 ENCOUNTER — Encounter (HOSPITAL_COMMUNITY): Payer: Self-pay | Admitting: Internal Medicine

## 2020-05-17 ENCOUNTER — Emergency Department (HOSPITAL_COMMUNITY): Payer: Medicare Other

## 2020-05-17 DIAGNOSIS — Y92009 Unspecified place in unspecified non-institutional (private) residence as the place of occurrence of the external cause: Secondary | ICD-10-CM | POA: Diagnosis not present

## 2020-05-17 DIAGNOSIS — Z66 Do not resuscitate: Secondary | ICD-10-CM | POA: Diagnosis present

## 2020-05-17 DIAGNOSIS — E538 Deficiency of other specified B group vitamins: Secondary | ICD-10-CM | POA: Diagnosis present

## 2020-05-17 DIAGNOSIS — F0390 Unspecified dementia without behavioral disturbance: Secondary | ICD-10-CM | POA: Diagnosis present

## 2020-05-17 DIAGNOSIS — R296 Repeated falls: Secondary | ICD-10-CM

## 2020-05-17 DIAGNOSIS — N179 Acute kidney failure, unspecified: Secondary | ICD-10-CM | POA: Diagnosis present

## 2020-05-17 DIAGNOSIS — Z9181 History of falling: Secondary | ICD-10-CM | POA: Diagnosis not present

## 2020-05-17 DIAGNOSIS — I110 Hypertensive heart disease with heart failure: Secondary | ICD-10-CM | POA: Diagnosis present

## 2020-05-17 DIAGNOSIS — K5909 Other constipation: Secondary | ICD-10-CM | POA: Diagnosis present

## 2020-05-17 DIAGNOSIS — G9340 Encephalopathy, unspecified: Secondary | ICD-10-CM | POA: Diagnosis not present

## 2020-05-17 DIAGNOSIS — I5032 Chronic diastolic (congestive) heart failure: Secondary | ICD-10-CM | POA: Diagnosis present

## 2020-05-17 DIAGNOSIS — R339 Retention of urine, unspecified: Secondary | ICD-10-CM | POA: Diagnosis present

## 2020-05-17 DIAGNOSIS — J449 Chronic obstructive pulmonary disease, unspecified: Secondary | ICD-10-CM | POA: Diagnosis present

## 2020-05-17 DIAGNOSIS — Z95810 Presence of automatic (implantable) cardiac defibrillator: Secondary | ICD-10-CM | POA: Diagnosis not present

## 2020-05-17 DIAGNOSIS — D7589 Other specified diseases of blood and blood-forming organs: Secondary | ICD-10-CM

## 2020-05-17 DIAGNOSIS — Z20822 Contact with and (suspected) exposure to covid-19: Secondary | ICD-10-CM | POA: Diagnosis present

## 2020-05-17 DIAGNOSIS — Z8543 Personal history of malignant neoplasm of ovary: Secondary | ICD-10-CM | POA: Diagnosis not present

## 2020-05-17 DIAGNOSIS — G9341 Metabolic encephalopathy: Secondary | ICD-10-CM | POA: Diagnosis present

## 2020-05-17 DIAGNOSIS — R269 Unspecified abnormalities of gait and mobility: Secondary | ICD-10-CM | POA: Diagnosis present

## 2020-05-17 DIAGNOSIS — E039 Hypothyroidism, unspecified: Secondary | ICD-10-CM | POA: Diagnosis present

## 2020-05-17 DIAGNOSIS — I428 Other cardiomyopathies: Secondary | ICD-10-CM | POA: Diagnosis present

## 2020-05-17 DIAGNOSIS — G894 Chronic pain syndrome: Secondary | ICD-10-CM | POA: Diagnosis present

## 2020-05-17 DIAGNOSIS — E785 Hyperlipidemia, unspecified: Secondary | ICD-10-CM | POA: Diagnosis present

## 2020-05-17 DIAGNOSIS — E860 Dehydration: Secondary | ICD-10-CM | POA: Diagnosis present

## 2020-05-17 DIAGNOSIS — M879 Osteonecrosis, unspecified: Secondary | ICD-10-CM | POA: Diagnosis present

## 2020-05-17 DIAGNOSIS — M47812 Spondylosis without myelopathy or radiculopathy, cervical region: Secondary | ICD-10-CM | POA: Diagnosis present

## 2020-05-17 DIAGNOSIS — Z8249 Family history of ischemic heart disease and other diseases of the circulatory system: Secondary | ICD-10-CM | POA: Diagnosis not present

## 2020-05-17 DIAGNOSIS — W19XXXA Unspecified fall, initial encounter: Secondary | ICD-10-CM | POA: Diagnosis present

## 2020-05-17 LAB — COMPREHENSIVE METABOLIC PANEL
ALT: 17 U/L (ref 0–44)
AST: 40 U/L (ref 15–41)
Albumin: 3.6 g/dL (ref 3.5–5.0)
Alkaline Phosphatase: 92 U/L (ref 38–126)
Anion gap: 11 (ref 5–15)
BUN: 39 mg/dL — ABNORMAL HIGH (ref 8–23)
CO2: 32 mmol/L (ref 22–32)
Calcium: 8.9 mg/dL (ref 8.9–10.3)
Chloride: 97 mmol/L — ABNORMAL LOW (ref 98–111)
Creatinine, Ser: 1.29 mg/dL — ABNORMAL HIGH (ref 0.44–1.00)
GFR, Estimated: 40 mL/min — ABNORMAL LOW (ref >60)
Glucose, Bld: 131 mg/dL — ABNORMAL HIGH (ref 70–99)
Potassium: 4.7 mmol/L (ref 3.5–5.1)
Sodium: 140 mmol/L (ref 135–145)
Total Bilirubin: 1 mg/dL (ref 0.3–1.2)
Total Protein: 7.2 g/dL (ref 6.5–8.1)

## 2020-05-17 LAB — URINALYSIS, ROUTINE W REFLEX MICROSCOPIC
Bacteria, UA: NONE SEEN
Bilirubin Urine: NEGATIVE
Glucose, UA: NEGATIVE mg/dL
Hgb urine dipstick: NEGATIVE
Ketones, ur: 5 mg/dL — AB
Nitrite: NEGATIVE
Protein, ur: NEGATIVE mg/dL
Specific Gravity, Urine: 1.014 (ref 1.005–1.030)
pH: 5 (ref 5.0–8.0)

## 2020-05-17 LAB — CBC
HCT: 40.2 % (ref 36.0–46.0)
Hemoglobin: 12.5 g/dL (ref 12.0–15.0)
MCH: 31.2 pg (ref 26.0–34.0)
MCHC: 31.1 g/dL (ref 30.0–36.0)
MCV: 100.2 fL — ABNORMAL HIGH (ref 80.0–100.0)
Platelets: 151 10*3/uL (ref 150–400)
RBC: 4.01 MIL/uL (ref 3.87–5.11)
RDW: 13.9 % (ref 11.5–15.5)
WBC: 9.4 10*3/uL (ref 4.0–10.5)
nRBC: 0 % (ref 0.0–0.2)

## 2020-05-17 LAB — TROPONIN I (HIGH SENSITIVITY)
Troponin I (High Sensitivity): 18 ng/L — ABNORMAL HIGH (ref <18)
Troponin I (High Sensitivity): 19 ng/L — ABNORMAL HIGH (ref <18)

## 2020-05-17 LAB — RESP PANEL BY RT-PCR (FLU A&B, COVID) ARPGX2
Influenza A by PCR: NEGATIVE
Influenza B by PCR: NEGATIVE
SARS Coronavirus 2 by RT PCR: NEGATIVE

## 2020-05-17 LAB — AMMONIA: Ammonia: 10 umol/L (ref 9–35)

## 2020-05-17 LAB — VITAMIN B12: Vitamin B-12: 144 pg/mL — ABNORMAL LOW (ref 180–914)

## 2020-05-17 LAB — FOLATE: Folate: 50.1 ng/mL (ref >5.9)

## 2020-05-17 LAB — BRAIN NATRIURETIC PEPTIDE: B Natriuretic Peptide: 80 pg/mL (ref 0.0–100.0)

## 2020-05-17 MED ORDER — ENOXAPARIN SODIUM 30 MG/0.3ML ~~LOC~~ SOLN
30.0000 mg | SUBCUTANEOUS | Status: DC
  Administered 2020-05-17 – 2020-05-18 (×2): 30 mg via SUBCUTANEOUS
  Filled 2020-05-17 (×2): qty 0.3

## 2020-05-17 MED ORDER — SODIUM CHLORIDE 0.9 % IV SOLN: INTRAVENOUS | Status: AC

## 2020-05-17 MED ORDER — CHLORHEXIDINE GLUCONATE CLOTH 2 % EX PADS
6.0000 | MEDICATED_PAD | Freq: Every day | CUTANEOUS | Status: DC
  Administered 2020-05-18 – 2020-05-22 (×5): 6 via TOPICAL

## 2020-05-17 MED ORDER — SODIUM CHLORIDE 0.9 % IV BOLUS
500.0000 mL | Freq: Once | INTRAVENOUS | Status: AC
  Administered 2020-05-17: 500 mL via INTRAVENOUS

## 2020-05-17 MED ORDER — ACETAMINOPHEN 325 MG PO TABS: 650.0000 mg | ORAL_TABLET | Freq: Four times a day (QID) | ORAL | Status: DC | PRN

## 2020-05-17 MED ORDER — OXYCODONE HCL 5 MG PO TABS
15.0000 mg | ORAL_TABLET | Freq: Four times a day (QID) | ORAL | Status: DC | PRN
  Administered 2020-05-17 – 2020-05-22 (×19): 15 mg via ORAL
  Filled 2020-05-17 (×19): qty 3

## 2020-05-17 MED ORDER — ACETAMINOPHEN 650 MG RE SUPP: 650.0000 mg | Freq: Four times a day (QID) | RECTAL | Status: DC | PRN

## 2020-05-17 MED ORDER — FUROSEMIDE 10 MG/ML IJ SOLN
20.0000 mg | Freq: Once | INTRAMUSCULAR | Status: AC
  Administered 2020-05-17: 20 mg via INTRAVENOUS
  Filled 2020-05-17: qty 4

## 2020-05-17 NOTE — Progress Notes (Signed)
[name] is a 87 y.o. female with medical history significant of nonischemic cardiomyopathy status post BiV ICD (downgraded from CRT-D to CRT-P on 09/10/2019), chronic diastolic heart failure, COPD, hypothyroidism, hypertension, hyperlipidemia, Dementia, hospital admission in November 2021 for ankle and calcaneal fractures secondary to a fall presenting to the ED via EMS for evaluation of 3 falls in the past 2 days.  Patient states she fell at home but does not exactly remember how, she thinks she might have lost her balance.  Denies hitting her head or loss of consciousness.  States she is not vaccinated against COVID because she stays at home all the time.  Denies fevers, chills, cough, shortness of breath, chest pain, nausea, vomiting, abdominal pain, or diarrhea. Reports having pain all over her body and repeatedly requesting her home oxycodone.  No additional history could be obtained.  05/17/20: Seen and examined at her bedside in the ED.  Reports pain all over as well as constipation.  She is currently on IV fluids for AKI.  Please refer to H&P dictated by my partner Dr. Marlowe Sax on 05/17/2020 for further details of the assessment and plan.

## 2020-05-17 NOTE — H&P (Signed)
History and Physical    [name]:[id number] DOB: [birth year] DOA: 05/16/2020  PCP: Carolee Rota, NP Patient coming from: Home  Chief Complaint: Falls  HPI: [name] is a 87 y.o. female with medical history significant of nonischemic cardiomyopathy status post BiV ICD (downgraded from CRT-D to CRT-P on 09/10/2019), chronic diastolic heart failure, COPD, hypothyroidism, hypertension, hyperlipidemia, Dementia, hospital admission in November 2021 for ankle and calcaneal fractures secondary to a fall presenting to the ED via EMS for evaluation of 3 falls in the past 2 days.  Patient states she fell at home but does not exactly remember how, she thinks she might have lost her balance.  Denies hitting her head or loss of consciousness.  States she is not vaccinated against COVID because she stays at home all the time.  Denies fevers, chills, cough, shortness of breath, chest pain, nausea, vomiting, abdominal pain, or diarrhea. Reports having pain all over her body and repeatedly requesting her home oxycodone.  No additional history could be obtained.  ED Course: Mildly hypertensive, remainder of vital signs stable.  WBC 9.4, hemoglobin 12.5, MCV 100.2, platelet count 151K.  Sodium 140, potassium 4.7, chloride 97, bicarb 32, BUN 39, creatinine 1.2 (baseline 0.5-0.7), glucose 131.  LFTs normal.  BNP normal.  High-sensitivity troponin mildly elevated but stable (19 > 18).  UA with negative nitrite, trace amount of leukocytes, 6-10 WBCs, and no bacteria.  Urine culture pending.  Chest x-ray showing no acute process.  X-ray of left ankle showing severe osteopenia and no definite acute fracture.  X-ray of right foot showing severe osteopenia without acute fracture.  Prior healed calcaneal avulsion fracture.  Marked soft tissue swelling greatest in the dorsal aspect of the forefoot.  Head CT negative for acute intracranial abnormality.  CT C-spine negative for acute finding.  X-ray of right  hip showing signs of advanced avascular necrosis with subchondral collapse and secondary osteoarthritis.  X-ray of left hand showing osteopenia and moderate osteoarthritis involving the PIP and DIP joints.  No acute findings.  Patient was given Tylenol, IV Lasix 20 mg, and a 500 cc normal saline bolus.  Foley catheter placed for acute urinary retention.  Review of Systems:  All systems reviewed and apart from history of presenting illness, are negative.  Past Medical History:  Diagnosis Date  . AICD (automatic cardioverter/defibrillator) present 06/27/2007   medtronic  . ARF (acute renal failure) ([place]) 09/2017  . Cancer ([place])    ovarian--stage 4  . Cardiomyopathy ([place])   . CHF (congestive heart failure) ([place])   . COPD (chronic obstructive pulmonary disease) ([place])   . Dyspnea   . Hypothyroidism   . Peripheral neuropathy   . Systemic hypertension   . Thyroid disease     Past Surgical History:  Procedure Laterality Date  . ABDOMINAL HYSTERECTOMY  oct 2000   along with ovarian ca surg  . APPENDECTOMY    . BIV PACEMAKER GENERATOR CHANGEOUT N/A 09/10/2019   Procedure: BIV PACEMAKER GENERATOR CHANGEOUT;  Surgeon: Sanda Klein, MD;  Location: [facility];  Service: Cardiovascular;  Laterality: N/A;  . CARDIAC CATHETERIZATION  09/27/1990   normal coronaries,dilated cardiomyopathy  . CARDIAC DEFIBRILLATOR PLACEMENT  06/27/2007   medtronic concerto  . CHOLECYSTECTOMY    . IMPLANTABLE CARDIOVERTER DEFIBRILLATOR GENERATOR CHANGE N/A 07/31/2013   Procedure: IMPLANTABLE CARDIOVERTER DEFIBRILLATOR GENERATOR CHANGE;  Surgeon: Sanda Klein, MD;  Location: [facility];  Service: Cardiovascular;  Laterality: N/A;  . NM MYOCAR PERF WALL MOTION  06/05/2007  no ischemia     reports that she has never smoked. She has never used smokeless tobacco. She reports that she does not drink alcohol and does not use drugs.  No Known Allergies  Family History  Problem Relation Age of Onset  .  Lung cancer Mother        died from cancer  . Heart attack Father   . Lung cancer Father   . Heart disease Paternal Grandmother     Prior to Admission medications   Medication Sig Start Date End Date Taking? Authorizing Provider  albuterol (VENTOLIN HFA) 108 (90 Base) MCG/ACT inhaler Inhale 1-2 puffs into the lungs every 6 (six) hours as needed for wheezing or shortness of breath.    [provider]  alendronate (FOSAMAX) 70 MG tablet Take 70 mg by mouth every Sunday.  09/01/15   [provider]  amLODipine (NORVASC) 5 MG tablet Take 1 tablet (5 mg total) by mouth daily. 01/29/20   Ledora Bottcher, PA  aspirin EC 81 MG tablet Take 1 tablet (81 mg total) by mouth daily. 10/03/17   Croitoru, Mihai, MD  carvedilol (COREG) 12.5 MG tablet Take 1 tablet (12.5 mg total) by mouth 2 (two) times daily with a meal. 03/20/20   Deboraha Sprang, MD  Cholecalciferol (VITAMIN D3 PO) Take 1 tablet by mouth once a week.     [provider]  dorzolamide-timolol (COSOPT) 22.3-6.8 MG/ML ophthalmic solution Place 1 drop into both eyes 2 (two) times daily. 02/18/20   Shelly Coss, MD  DULERA 100-5 MCG/ACT AERO Inhale 2 puffs into the lungs every 6 (six) hours as needed for wheezing or shortness of breath.  08/29/19   [provider]  DULoxetine (CYMBALTA) 60 MG capsule Take 60 mg by mouth daily.  08/25/15   [provider]  furosemide (LASIX) 20 MG tablet TAKE 2 TABLETS (40 MG TOTAL) BY MOUTH DAILY. 04/17/20   Deboraha Sprang, MD  gabapentin (NEURONTIN) 100 MG capsule Take 100-300 mg by mouth at bedtime as needed (pain).     [provider]  latanoprost (XALATAN) 0.005 % ophthalmic solution Place 1 drop into both eyes at bedtime. 02/18/20   Shelly Coss, MD  liothyronine (CYTOMEL) 25 MCG tablet Take 25 mcg by mouth daily. 07/02/15   [provider]  Melatonin 10 MG TABS Take 10 mg by mouth at bedtime.    [provider]  Multiple Vitamin  (MULTIVITAMIN WITH MINERALS) TABS tablet Take 1 tablet by mouth daily.    [provider]  oxyCODONE (ROXICODONE) 15 MG immediate release tablet Take 1 tablet (15 mg total) by mouth every 6 (six) hours as needed for pain. 02/17/20   Shelly Coss, MD  polyethylene glycol (MIRALAX / GLYCOLAX) 17 g packet Take 17 g by mouth daily as needed. 02/18/20   Shelly Coss, MD  SYNTHROID 112 MCG tablet Take 112 mcg by mouth daily before breakfast.  09/12/15   [provider]  traZODone (DESYREL) 50 MG tablet Take 50 mg by mouth at bedtime as needed for sleep.  01/10/20   [provider]    Physical Exam: Vitals:   05/17/20 0315 05/17/20 0330 05/17/20 0345 05/17/20 0430  BP: (!) 174/85 140/60 (!) 168/73 (!) 157/56  Pulse: 87 79 83 79  Resp: 18 15 19 16   Temp:      TempSrc:      SpO2: 99% 98% 100% 100%  Weight:      Height:  Physical Exam Constitutional:      General: She is not in acute distress. HENT:     Head: Normocephalic and atraumatic.     Mouth/Throat:     Comments: Very dry mucous membranes Eyes:     Extraocular Movements: Extraocular movements intact.     Conjunctiva/sclera: Conjunctivae normal.  Cardiovascular:     Rate and Rhythm: Normal rate and regular rhythm.     Pulses: Normal pulses.  Pulmonary:     Effort: Pulmonary effort is normal. No respiratory distress.     Breath sounds: No wheezing or rales.  Abdominal:     General: Bowel sounds are normal. There is no distension.     Palpations: Abdomen is soft.     Tenderness: There is no abdominal tenderness.  Musculoskeletal:     Cervical back: Normal range of motion and neck supple.     Right lower leg: Edema present.     Left lower leg: Edema present.     Comments: +4 pedal edema bilaterally  Skin:    General: Skin is warm and dry.  Neurological:     General: No focal deficit present.     Mental Status: She is alert and oriented to person, place, and time.     Labs on  Admission: I have personally reviewed following labs and imaging studies  CBC: Recent Labs  Lab 05/16/20 2344  WBC 9.4  HGB 12.5  HCT 40.2  MCV 100.2*  PLT 185   Basic Metabolic Panel: Recent Labs  Lab 05/16/20 2344  NA 140  K 4.7  CL 97*  CO2 32  GLUCOSE 131*  BUN 39*  CREATININE 1.29*  CALCIUM 8.9   GFR: Estimated Creatinine Clearance: 26 mL/min (A) (by C-G formula based on SCr of 1.29 mg/dL (H)). Liver Function Tests: Recent Labs  Lab 05/16/20 2344  AST 40  ALT 17  ALKPHOS 92  BILITOT 1.0  PROT 7.2  ALBUMIN 3.6   No results for input(s): LIPASE, AMYLASE in the last 168 hours. No results for input(s): AMMONIA in the last 168 hours. Coagulation Profile: No results for input(s): INR, PROTIME in the last 168 hours. Cardiac Enzymes: No results for input(s): CKTOTAL, CKMB, CKMBINDEX, TROPONINI in the last 168 hours. BNP (last 3 results) No results for input(s): PROBNP in the last 8760 hours. HbA1C: No results for input(s): HGBA1C in the last 72 hours. CBG: No results for input(s): GLUCAP in the last 168 hours. Lipid Profile: No results for input(s): CHOL, HDL, LDLCALC, TRIG, CHOLHDL, LDLDIRECT in the last 72 hours. Thyroid Function Tests: No results for input(s): TSH, T4TOTAL, FREET4, T3FREE, THYROIDAB in the last 72 hours. Anemia Panel: No results for input(s): VITAMINB12, FOLATE, FERRITIN, TIBC, IRON, RETICCTPCT in the last 72 hours. Urine analysis:    Component Value Date/Time   COLORURINE YELLOW 05/17/2020 0400   APPEARANCEUR CLEAR 05/17/2020 0400   LABSPEC 1.014 05/17/2020 0400   PHURINE 5.0 05/17/2020 0400   GLUCOSEU NEGATIVE 05/17/2020 0400   HGBUR NEGATIVE 05/17/2020 0400   BILIRUBINUR NEGATIVE 05/17/2020 0400   KETONESUR 5 (A) 05/17/2020 0400   PROTEINUR NEGATIVE 05/17/2020 0400   NITRITE NEGATIVE 05/17/2020 0400   LEUKOCYTESUR TRACE (A) 05/17/2020 0400    Radiological Exams on Admission: DG Chest 2 View  Result Date:  05/16/2020 CLINICAL DATA:  Frequent falls EXAM: CHEST - 2 VIEW COMPARISON:  02/15/2020 FINDINGS: Frontal and lateral views of the chest demonstrate stable AICD. Cardiac silhouette is stable. Chronic prominence of the pulmonary arteries, without airspace  disease, effusion, or pneumothorax. Chronic elevation of the left hemidiaphragm. No acute bony abnormalities. IMPRESSION: 1. Stable exam, no acute process. Electronically Signed   By: Randa Ngo M.D.   On: 05/16/2020 23:40   DG Ankle Complete Left  Result Date: 05/16/2020 CLINICAL DATA:  87 year old female with frequent fall. Left ankle pain. EXAM: LEFT ANKLE COMPLETE - 3+ VIEW COMPARISON:  Left foot radiograph dated 02/11/2020. FINDINGS: Evaluation is very limited due to severe osteopenia. No definite acute fracture. Old fracture of the distal fibula with bridging callus. Old fracture of the posterior malleolus. No dislocation. The ankle mortise is intact. IMPRESSION: No definite acute fracture.  Severe osteopenia. Electronically Signed   By: Anner Crete M.D.   On: 05/16/2020 23:44   CT Head Wo Contrast  Result Date: 05/16/2020 CLINICAL DATA:  Increased falling.  Chronic neck pain. EXAM: CT HEAD WITHOUT CONTRAST CT CERVICAL SPINE WITHOUT CONTRAST TECHNIQUE: Multidetector CT imaging of the head and cervical spine was performed following the standard protocol without intravenous contrast. Multiplanar CT image reconstructions of the cervical spine were also generated. COMPARISON:  03/14/2019 FINDINGS: CT HEAD FINDINGS Brain: Age related atrophy. Chronic small-vessel ischemic changes of the cerebral hemispheric white matter. Chronic arachnoid cyst at the anterior middle cranial fossa on right as seen previously. No sign acute infarction, mass lesion, hemorrhage, hydrocephalus or extra-axial collection. Vascular: There is atherosclerotic calcification of the major vessels at the base of the brain. Skull: Negative Sinuses/Orbits: Clear/normal Other: None  CT CERVICAL SPINE FINDINGS Alignment: 3 mm degenerative anterolisthesis C3-4. 2 mm degenerative anterolisthesis C4-5 and C7-T1. Skull base and vertebrae: No evidence of recent fracture or worrisome focal bone lesion. Old appearing minor superior endplate depression at T1. Soft tissues and spinal canal: No significant soft tissue neck lesion seen. Atherosclerotic calcification at both carotid bifurcations. Scarring at the lung apices. Disc levels: Ordinary osteoarthritis at the C1-2 articulation but without encroachment upon the neural structures. C2-3: Facet osteoarthritis.  No significant stenosis. C3-4: Facet osteoarthritis right more than left. 3 mm of anterolisthesis. Right foraminal narrowing. C4-5: Facet osteoarthritis right more than left. 2 mm of anterolisthesis. Right foraminal narrowing. C5-6: Spondylosis with endplate osteophytes. Bilateral foraminal narrowing. C6-7: Small endplate osteophytes.  No significant stenosis. C7-T1: Facet arthritis with 2 mm of anterolisthesis.  No stenosis. Upper chest: Ordinary scarring in the upper lobes. Other: None IMPRESSION: HEAD CT: No acute or traumatic finding. Age related atrophy and chronic small-vessel ischemic changes. Chronic arachnoid cyst at the anterior middle cranial fossa on the right. No change since the prior exam. CERVICAL SPINE CT: No acute or traumatic finding. Chronic degenerative spondylosis and facet arthropathy as outlined above. Foraminal narrowing on the right at C3-4 and C4-5, and bilaterally at C5-6. Electronically Signed   By: Nelson Chimes M.D.   On: 05/16/2020 23:47   CT CERVICAL SPINE WO CONTRAST  Result Date: 05/16/2020 CLINICAL DATA:  Increased falling.  Chronic neck pain. EXAM: CT HEAD WITHOUT CONTRAST CT CERVICAL SPINE WITHOUT CONTRAST TECHNIQUE: Multidetector CT imaging of the head and cervical spine was performed following the standard protocol without intravenous contrast. Multiplanar CT image reconstructions of the cervical spine  were also generated. COMPARISON:  03/14/2019 FINDINGS: CT HEAD FINDINGS Brain: Age related atrophy. Chronic small-vessel ischemic changes of the cerebral hemispheric white matter. Chronic arachnoid cyst at the anterior middle cranial fossa on right as seen previously. No sign acute infarction, mass lesion, hemorrhage, hydrocephalus or extra-axial collection. Vascular: There is atherosclerotic calcification of the major vessels at the base  of the brain. Skull: Negative Sinuses/Orbits: Clear/normal Other: None CT CERVICAL SPINE FINDINGS Alignment: 3 mm degenerative anterolisthesis C3-4. 2 mm degenerative anterolisthesis C4-5 and C7-T1. Skull base and vertebrae: No evidence of recent fracture or worrisome focal bone lesion. Old appearing minor superior endplate depression at T1. Soft tissues and spinal canal: No significant soft tissue neck lesion seen. Atherosclerotic calcification at both carotid bifurcations. Scarring at the lung apices. Disc levels: Ordinary osteoarthritis at the C1-2 articulation but without encroachment upon the neural structures. C2-3: Facet osteoarthritis.  No significant stenosis. C3-4: Facet osteoarthritis right more than left. 3 mm of anterolisthesis. Right foraminal narrowing. C4-5: Facet osteoarthritis right more than left. 2 mm of anterolisthesis. Right foraminal narrowing. C5-6: Spondylosis with endplate osteophytes. Bilateral foraminal narrowing. C6-7: Small endplate osteophytes.  No significant stenosis. C7-T1: Facet arthritis with 2 mm of anterolisthesis.  No stenosis. Upper chest: Ordinary scarring in the upper lobes. Other: None IMPRESSION: HEAD CT: No acute or traumatic finding. Age related atrophy and chronic small-vessel ischemic changes. Chronic arachnoid cyst at the anterior middle cranial fossa on the right. No change since the prior exam. CERVICAL SPINE CT: No acute or traumatic finding. Chronic degenerative spondylosis and facet arthropathy as outlined above. Foraminal  narrowing on the right at C3-4 and C4-5, and bilaterally at C5-6. Electronically Signed   By: Nelson Chimes M.D.   On: 05/16/2020 23:47   DG Hand Complete Left  Result Date: 05/17/2020 CLINICAL DATA:  Status post fall. EXAM: LEFT HAND - COMPLETE 3+ VIEW COMPARISON:  01/03/2012 FINDINGS: Previous ORIF of the distal radius. The bones appear diffusely osteopenic. Moderate degenerative changes are noted involving the PIP joints and DIP joints. No fractures or dislocation identified. IMPRESSION: 1. No acute findings. 2. Osteopenia. 3. Moderate osteoarthritis involving the PIP and D IP joints. Electronically Signed   By: Kerby Moors M.D.   On: 05/17/2020 05:31   DG Foot Complete Right  Result Date: 05/16/2020 CLINICAL DATA:  Frequent falls, right foot pain EXAM: RIGHT FOOT COMPLETE - 3+ VIEW COMPARISON:  02/12/2020 FINDINGS: Frontal, oblique, lateral views of the right foot are obtained. Bones are severely osteopenic. Previous calcaneal avulsion fracture at the Achilles tendon insertion has healed in the interim. There are no acute displaced fractures. Prominent osteoarthritis at the first metatarsophalangeal joint and throughout the interphalangeal joints unchanged. There is severe soft tissue swelling of the right foot, greatest in the dorsal aspect of the forefoot. IMPRESSION: 1. Marked soft tissue swelling greatest in the dorsal aspect of the forefoot. 2. Severe osteopenia without acute fracture. 3. Prior healed calcaneal avulsion fracture. Electronically Signed   By: Randa Ngo M.D.   On: 05/16/2020 23:42   DG Hip Unilat W or Wo Pelvis 2-3 Views Right  Result Date: 05/17/2020 CLINICAL DATA:  Right hip pain status post fall. EXAM: DG HIP (WITH OR WITHOUT PELVIS) 2-3V RIGHT COMPARISON:  None. FINDINGS: Exam detail is limited due to non anatomic position knee of the right hip. No definite fracture identified. Signs of advanced avascular necrosis with subchondral collapse and secondary osteoarthritis of  the right hip is noted. Left hip appears located. Scoliosis and degenerative disc disease identified within the lumbar spine. IMPRESSION: 1. No definite acute findings. If there is high clinical suspicion for occult fracture or the patient refuses to weightbear, consider further evaluation with MRI. Although CT is expeditious, evidence is lacking regarding accuracy of CT over plain film radiography. 2. Signs of advanced avascular necrosis involving the right hip with subchondral collapse and  secondary osteoarthritis. Electronically Signed   By: Kerby Moors M.D.   On: 05/17/2020 05:29    EKG: Independently reviewed.  Interpretation limited secondary to paced rhythm.  Assessment/Plan Principal Problem:   Frequent falls Active Problems:   Acute encephalopathy   AKI (acute kidney injury) ([place])   Macrocytosis   Chronic pain syndrome   Frequent falls: No fever or leukocytosis.  Chest x-ray not suggestive of pneumonia.  UA not strongly suggestive of infection (negative nitrite, trace amount of leukocytes, 6-10 WBCs, no bacteria).  Urine culture pending.  Head CT negative for acute intracranial abnormality.  Likely due to polypharmacy -  Lasix/antihypertensives, gabapentin, oxycodone, and trazodone listed in home meds (pharmacy med rec pending). -Screen for Covid, follow airborne and contact precautions.  Check B12 level.  Check orthostatics.  PT/OT, fall precautions.  AKI, acute urinary retention: BUN 39, creatinine 1.2 (baseline 0.5-0.7).  Appears dehydrated with very dry mucous membranes.  Foley catheter placed in the ED for acute urinary retention. -Gentle IV fluid hydration.  Check urine sodium and creatinine.  Avoid nephrotoxic agents.  Continue to monitor renal function and urine output.  Dementia, mild acute metabolic encephalopathy: Suspect due to dehydration, AKI/uremia.  Head CT negative for acute intracranial abnormality.  It seems she was disoriented when ED provider evaluated her.   Currently AAO x3 and answering questions appropriately. -Gentle IV fluid hydration.  Check B12, TSH, and ammonia levels.  Macrocytosis without anemia -Check B12 and folate levels  Chronic pain syndrome -Patient is complaining of pain all over and repeatedly requesting her home oxycodone which she takes chronically. Will continue oxycodone and gabapentin, however, medication regimen will have to be adjusted given frequent falls.  Chronic diastolic heart failure: Does have significant pedal edema.  However, BNP normal and chest x-ray not suggestive of pulmonary edema. -Patient was given IV Lasix 20 mg in the ED. Will hold additional doses given AKI and concern for dehydration in setting of frequent falls.  COPD: Stable.  No signs of acute exacerbation at this time. -Resume home inhalers after pharmacy med rec is done  Hypothyroidism -Resume home med after pharmacy med rec is done  Hypertension: Systolic currently in the 150s. -Orthostatics ordered.  Resume home meds after pharmacy med rec is done.  Concern for avascular necrosis of the right hip: X-ray of showing signs of advanced avascular necrosis with subchondral collapse and secondary osteoarthritis. -MRI department not sure whether her ICD is compatible.  They will do some more research to find out.  DVT prophylaxis: Lovenox Code Status: Patient wishes to be DNR. Family Communication: No family available at this time. Disposition Plan: Status is: Inpatient  Remains inpatient appropriate because:IV treatments appropriate due to intensity of illness or inability to take PO and Inpatient level of care appropriate due to severity of illness   Dispo: The patient is from: Home              Anticipated d/c is to: SNF              Anticipated d/c date is: 3 days              Patient currently is not medically stable to d/c.   Difficult to place patient No  Level of care: Level of care: Telemetry   The medical decision making on  this patient was of high complexity and the patient is at high risk for clinical deterioration, therefore this is a level 3 visit.  Shela Leff MD [facility]  If 7PM-7AM, please contact night-coverage [email or web address]  05/17/2020, 7:27 AM

## 2020-05-17 NOTE — ED Notes (Signed)
ED TO INPATIENT HANDOFF REPORT  ED Nurse Name and Phone #: [phone]  S Name/Age/Gender [name] 87 y.o. female Room/Bed: WA14/WA14  Code Status   Code Status: DNR  Home/SNF/Other Home Patient oriented to: self, place, time and situation Is this baseline? Yes   Triage Complete: Triage complete  Chief Complaint Acute encephalopathy [G93.40] Frequent falls [R29.6]  Triage Note Pt was d/cd from facility [facility]) on 1/31. Has been living at home with family. Was doing well until 2 days ago when she started falling. States that she has fallen 3 times today. R foot is swollen. Alert and confused at baseline.     Allergies No Known Allergies  Level of Care/Admitting Diagnosis ED Disposition    ED Disposition Condition Comment   Admit  Hospital Area: [facility] [[id number]]  Level of Care: Telemetry [5]  Admit to tele based on following criteria: Monitor for Ischemic changes  May admit patient to [facility] or [facility] if equivalent level of care is available:: No  Covid Evaluation: Confirmed COVID Negative  Diagnosis: Frequent falls [540086]  Admitting Physician: Kayleen Memos [[id number]]  Attending Physician: Kayleen Memos [[id number]]  Estimated length of stay: past midnight tomorrow  Certification:: I certify this patient will need inpatient services for at least 2 midnights       B Medical/Surgery History Past Medical History:  Diagnosis Date  . AICD (automatic cardioverter/defibrillator) present 06/27/2007   medtronic  . ARF (acute renal failure) ([place]) 09/2017  . Cancer ([place])    ovarian--stage 4  . Cardiomyopathy ([place])   . CHF (congestive heart failure) ([place])   . COPD (chronic obstructive pulmonary disease) ([place])   . Dyspnea   . Hypothyroidism   . Peripheral neuropathy   . Systemic hypertension   . Thyroid disease    Past Surgical History:  Procedure Laterality Date  . ABDOMINAL HYSTERECTOMY  oct 2000   along with  ovarian ca surg  . APPENDECTOMY    . BIV PACEMAKER GENERATOR CHANGEOUT N/A 09/10/2019   Procedure: BIV PACEMAKER GENERATOR CHANGEOUT;  Surgeon: Sanda Klein, MD;  Location: [facility];  Service: Cardiovascular;  Laterality: N/A;  . CARDIAC CATHETERIZATION  09/27/1990   normal coronaries,dilated cardiomyopathy  . CARDIAC DEFIBRILLATOR PLACEMENT  06/27/2007   medtronic concerto  . CHOLECYSTECTOMY    . IMPLANTABLE CARDIOVERTER DEFIBRILLATOR GENERATOR CHANGE N/A 07/31/2013   Procedure: IMPLANTABLE CARDIOVERTER DEFIBRILLATOR GENERATOR CHANGE;  Surgeon: Sanda Klein, MD;  Location: [facility] C[facility];  Service: Cardiovascular;  Laterality: N/A;  . NM MYOCAR PERF WALL MOTION  06/05/2007   no ischemia     A IV Location/Drains/Wounds Patient Lines/Drains/Airways Status    Active Line/Drains/Airways    Name Placement date Placement time Site Days   Peripheral IV 05/17/20 Anterior;Left Forearm 05/17/20  0115  Forearm  less than 1   Urethral Catheter Clarise Cruz, RN Double-lumen;Straight-tip;Non-latex 14 Fr. 05/17/20  0425  Double-lumen;Straight-tip;Non-latex  less than 1   External Urinary Catheter 02/11/20  2330  --  96   Wound / Incision (Open or Dehisced) 02/13/20 (MASD) Moisture Associated Skin Damage Buttocks Left;Right 02/13/20  --  Buttocks  94          Intake/Output Last 24 hours  Intake/Output Summary (Last 24 hours) at 05/17/2020 1746 Last data filed at 05/17/2020 0431 Gross per 24 hour  Intake --  Output 1000 ml  Net -1000 ml    Labs/Imaging Results for orders placed or performed during the hospital encounter of 05/16/20 (  from the past 48 hour(s))  Brain natriuretic peptide     Status: None   Collection Time: 05/16/20 11:44 PM  Result Value Ref Range   B Natriuretic Peptide 80.0 0.0 - 100.0 pg/mL    Comment: Performed at [facility], [place] [address], [place], [place] [address]  CBC     Status: Abnormal   Collection Time: 05/16/20 11:44 PM  Result Value Ref  Range   WBC 9.4 4.0 - 10.5 K/uL   RBC 4.01 3.87 - 5.11 MIL/uL   Hemoglobin 12.5 12.0 - 15.0 g/dL   HCT 40.2 36.0 - 46.0 %   MCV 100.2 (H) 80.0 - 100.0 fL   MCH 31.2 26.0 - 34.0 pg   MCHC 31.1 30.0 - 36.0 g/dL   RDW 13.9 11.5 - 15.5 %   Platelets 151 150 - 400 K/uL   nRBC 0.0 0.0 - 0.2 %    Comment: Performed at [facility], [place] [address], [place], [place] [address]  Comprehensive metabolic panel     Status: Abnormal   Collection Time: 05/16/20 11:44 PM  Result Value Ref Range   Sodium 140 135 - 145 mmol/L   Potassium 4.7 3.5 - 5.1 mmol/L   Chloride 97 (L) 98 - 111 mmol/L   CO2 32 22 - 32 mmol/L   Glucose, Bld 131 (H) 70 - 99 mg/dL    Comment: Glucose reference range applies only to samples taken after fasting for at least 8 hours.   BUN 39 (H) 8 - 23 mg/dL   Creatinine, Ser 1.29 (H) 0.44 - 1.00 mg/dL   Calcium 8.9 8.9 - 10.3 mg/dL   Total Protein 7.2 6.5 - 8.1 g/dL   Albumin 3.6 3.5 - 5.0 g/dL   AST 40 15 - 41 U/L   ALT 17 0 - 44 U/L   Alkaline Phosphatase 92 38 - 126 U/L   Total Bilirubin 1.0 0.3 - 1.2 mg/dL   GFR, Estimated 40 (L) >60 mL/min    Comment: (NOTE) Calculated using the CKD-EPI Creatinine Equation (2021)    Anion gap 11 5 - 15    Comment: Performed at [facility], [place] [address] [place], [place] [address]  Troponin I (High Sensitivity)     Status: Abnormal   Collection Time: 05/16/20 11:44 PM  Result Value Ref Range   Troponin I (High Sensitivity) 19 (H) <18 ng/L    Comment: (NOTE) Elevated high sensitivity troponin I (hsTnI) values and significant  changes across serial measurements may suggest ACS but many other  chronic and acute conditions are known to elevate hsTnI results.  Refer to the "Links" section for chest pain algorithms and additional  guidance. Performed at [facility], [place] [address] [place], [place] [address]   Troponin I (High Sensitivity)     Status: Abnormal   Collection  Time: 05/16/20 11:45 PM  Result Value Ref Range   Troponin I (High Sensitivity) 18 (H) <18 ng/L    Comment: (NOTE) Elevated high sensitivity troponin I (hsTnI) values and significant  changes across serial measurements may suggest ACS but many other  chronic and acute conditions are known to elevate hsTnI results.  Refer to the "Links" section for chest pain algorithms and additional  guidance. Performed at [facility], [place] [address], [place], [place] [address]   Urinalysis, Routine w reflex microscopic Urine, Catheterized     Status: Abnormal   Collection Time: 05/17/20  4:00 AM  Result  Value Ref Range   Color, Urine YELLOW YELLOW   APPearance CLEAR CLEAR   Specific Gravity, Urine 1.014 1.005 - 1.030   pH 5.0 5.0 - 8.0   Glucose, UA NEGATIVE NEGATIVE mg/dL   Hgb urine dipstick NEGATIVE NEGATIVE   Bilirubin Urine NEGATIVE NEGATIVE   Ketones, ur 5 (A) NEGATIVE mg/dL   Protein, ur NEGATIVE NEGATIVE mg/dL   Nitrite NEGATIVE NEGATIVE   Leukocytes,Ua TRACE (A) NEGATIVE   RBC / HPF 0-5 0 - 5 RBC/hpf   WBC, UA 6-10 0 - 5 WBC/hpf   Bacteria, UA NONE SEEN NONE SEEN   Mucus PRESENT     Comment: Performed at [facility], [place] [address], [place], [place] [address]  Resp Panel by RT-PCR (Flu A&B, Covid) Nasopharyngeal Swab     Status: None   Collection Time: 05/17/20  7:16 AM   Specimen: Nasopharyngeal Swab; Nasopharyngeal(NP) swabs in vial transport medium  Result Value Ref Range   SARS Coronavirus 2 by RT PCR NEGATIVE NEGATIVE    Comment: (NOTE) SARS-CoV-2 target nucleic acids are NOT DETECTED.  The SARS-CoV-2 RNA is generally detectable in upper respiratory specimens during the acute phase of infection. The lowest concentration of SARS-CoV-2 viral copies this assay can detect is 138 copies/mL. A negative result does not preclude SARS-Cov-2 infection and should not be used as the sole basis for treatment or other patient management decisions.  A negative result may occur with  improper specimen collection/handling, submission of specimen other than nasopharyngeal swab, presence of viral mutation(s) within the areas targeted by this assay, and inadequate number of viral copies(<138 copies/mL). A negative result must be combined with clinical observations, patient history, and epidemiological information. The expected result is Negative.  Fact Sheet for Patients:  EntrepreneurPulse.com.au  Fact Sheet for Healthcare Providers:  IncredibleEmployment.be  This test is no t yet approved or cleared by the [place] FDA and  has been authorized for detection and/or diagnosis of SARS-CoV-2 by FDA under an Emergency Use Authorization (EUA). This EUA will remain  in effect (meaning this test can be used) for the duration of the COVID-19 declaration under Section 564(b)(1) of the Act, 21 U.S.C.section 360bbb-3(b)(1), unless the authorization is terminated  or revoked sooner.       Influenza A by PCR NEGATIVE NEGATIVE   Influenza B by PCR NEGATIVE NEGATIVE    Comment: (NOTE) The Xpert Xpress SARS-CoV-2/FLU/RSV plus assay is intended as an aid in the diagnosis of influenza from Nasopharyngeal swab specimens and should not be used as a sole basis for treatment. Nasal washings and aspirates are unacceptable for Xpert Xpress SARS-CoV-2/FLU/RSV testing.  Fact Sheet for Patients: EntrepreneurPulse.com.au  Fact Sheet for Healthcare Providers: IncredibleEmployment.be  This test is not yet approved or cleared by the [place] FDA and has been authorized for detection and/or diagnosis of SARS-CoV-2 by FDA under an Emergency Use Authorization (EUA). This EUA will remain in effect (meaning this test can be used) for the duration of the COVID-19 declaration under Section 564(b)(1) of the Act, 21 U.S.C. section 360bbb-3(b)(1), unless the authorization is  terminated or revoked.  Performed at [facility], [place] [address] [place], [place] [address]   Vitamin B12     Status: Abnormal   Collection Time: 05/17/20  8:00 AM  Result Value Ref Range   Vitamin B-12 144 (L) 180 - 914 pg/mL    Comment: (NOTE) This assay is not validated for testing neonatal or myeloproliferative syndrome specimens for Vitamin B12  levels. Performed at [facility], [place] [address] [place], [place] [address]   Folate, serum, performed at [facility] lab     Status: None   Collection Time: 05/17/20  8:00 AM  Result Value Ref Range   Folate 50.1 >5.9 ng/mL    Comment: RESULTS CONFIRMED BY MANUAL DILUTION Performed at [place] [address] [place], [place] [address]   Ammonia     Status: None   Collection Time: 05/17/20  8:00 AM  Result Value Ref Range   Ammonia 10 9 - 35 umol/L    Comment: Performed at [facility], [place] [address] [place], [place] [address]   DG Chest 2 View  Result Date: 05/16/2020 CLINICAL DATA:  Frequent falls EXAM: CHEST - 2 VIEW COMPARISON:  02/15/2020 FINDINGS: Frontal and lateral views of the chest demonstrate stable AICD. Cardiac silhouette is stable. Chronic prominence of the pulmonary arteries, without airspace disease, effusion, or pneumothorax. Chronic elevation of the left hemidiaphragm. No acute bony abnormalities. IMPRESSION: 1. Stable exam, no acute process. Electronically Signed   By: Randa Ngo M.D.   On: 05/16/2020 23:40   DG Ankle Complete Left  Result Date: 05/16/2020 CLINICAL DATA:  87 year old female with frequent fall. Left ankle pain. EXAM: LEFT ANKLE COMPLETE - 3+ VIEW COMPARISON:  Left foot radiograph dated 02/11/2020. FINDINGS: Evaluation is very limited due to severe osteopenia. No definite acute fracture. Old fracture of the distal fibula with bridging callus. Old fracture of the posterior malleolus. No dislocation. The ankle  mortise is intact. IMPRESSION: No definite acute fracture.  Severe osteopenia. Electronically Signed   By: Anner Crete M.D.   On: 05/16/2020 23:44   CT Head Wo Contrast  Result Date: 05/16/2020 CLINICAL DATA:  Increased falling.  Chronic neck pain. EXAM: CT HEAD WITHOUT CONTRAST CT CERVICAL SPINE WITHOUT CONTRAST TECHNIQUE: Multidetector CT imaging of the head and cervical spine was performed following the standard protocol without intravenous contrast. Multiplanar CT image reconstructions of the cervical spine were also generated. COMPARISON:  03/14/2019 FINDINGS: CT HEAD FINDINGS Brain: Age related atrophy. Chronic small-vessel ischemic changes of the cerebral hemispheric white matter. Chronic arachnoid cyst at the anterior middle cranial fossa on right as seen previously. No sign acute infarction, mass lesion, hemorrhage, hydrocephalus or extra-axial collection. Vascular: There is atherosclerotic calcification of the major vessels at the base of the brain. Skull: Negative Sinuses/Orbits: Clear/normal Other: None CT CERVICAL SPINE FINDINGS Alignment: 3 mm degenerative anterolisthesis C3-4. 2 mm degenerative anterolisthesis C4-5 and C7-T1. Skull base and vertebrae: No evidence of recent fracture or worrisome focal bone lesion. Old appearing minor superior endplate depression at T1. Soft tissues and spinal canal: No significant soft tissue neck lesion seen. Atherosclerotic calcification at both carotid bifurcations. Scarring at the lung apices. Disc levels: Ordinary osteoarthritis at the C1-2 articulation but without encroachment upon the neural structures. C2-3: Facet osteoarthritis.  No significant stenosis. C3-4: Facet osteoarthritis right more than left. 3 mm of anterolisthesis. Right foraminal narrowing. C4-5: Facet osteoarthritis right more than left. 2 mm of anterolisthesis. Right foraminal narrowing. C5-6: Spondylosis with endplate osteophytes. Bilateral foraminal narrowing. C6-7: Small endplate  osteophytes.  No significant stenosis. C7-T1: Facet arthritis with 2 mm of anterolisthesis.  No stenosis. Upper chest: Ordinary scarring in the upper lobes. Other: None IMPRESSION: HEAD CT: No acute or traumatic finding. Age related atrophy and chronic small-vessel ischemic changes. Chronic arachnoid cyst at the anterior middle cranial fossa on the right. No change since the prior  exam. CERVICAL SPINE CT: No acute or traumatic finding. Chronic degenerative spondylosis and facet arthropathy as outlined above. Foraminal narrowing on the right at C3-4 and C4-5, and bilaterally at C5-6. Electronically Signed   By: Nelson Chimes M.D.   On: 05/16/2020 23:47   CT CERVICAL SPINE WO CONTRAST  Result Date: 05/16/2020 CLINICAL DATA:  Increased falling.  Chronic neck pain. EXAM: CT HEAD WITHOUT CONTRAST CT CERVICAL SPINE WITHOUT CONTRAST TECHNIQUE: Multidetector CT imaging of the head and cervical spine was performed following the standard protocol without intravenous contrast. Multiplanar CT image reconstructions of the cervical spine were also generated. COMPARISON:  03/14/2019 FINDINGS: CT HEAD FINDINGS Brain: Age related atrophy. Chronic small-vessel ischemic changes of the cerebral hemispheric white matter. Chronic arachnoid cyst at the anterior middle cranial fossa on right as seen previously. No sign acute infarction, mass lesion, hemorrhage, hydrocephalus or extra-axial collection. Vascular: There is atherosclerotic calcification of the major vessels at the base of the brain. Skull: Negative Sinuses/Orbits: Clear/normal Other: None CT CERVICAL SPINE FINDINGS Alignment: 3 mm degenerative anterolisthesis C3-4. 2 mm degenerative anterolisthesis C4-5 and C7-T1. Skull base and vertebrae: No evidence of recent fracture or worrisome focal bone lesion. Old appearing minor superior endplate depression at T1. Soft tissues and spinal canal: No significant soft tissue neck lesion seen. Atherosclerotic calcification at both  carotid bifurcations. Scarring at the lung apices. Disc levels: Ordinary osteoarthritis at the C1-2 articulation but without encroachment upon the neural structures. C2-3: Facet osteoarthritis.  No significant stenosis. C3-4: Facet osteoarthritis right more than left. 3 mm of anterolisthesis. Right foraminal narrowing. C4-5: Facet osteoarthritis right more than left. 2 mm of anterolisthesis. Right foraminal narrowing. C5-6: Spondylosis with endplate osteophytes. Bilateral foraminal narrowing. C6-7: Small endplate osteophytes.  No significant stenosis. C7-T1: Facet arthritis with 2 mm of anterolisthesis.  No stenosis. Upper chest: Ordinary scarring in the upper lobes. Other: None IMPRESSION: HEAD CT: No acute or traumatic finding. Age related atrophy and chronic small-vessel ischemic changes. Chronic arachnoid cyst at the anterior middle cranial fossa on the right. No change since the prior exam. CERVICAL SPINE CT: No acute or traumatic finding. Chronic degenerative spondylosis and facet arthropathy as outlined above. Foraminal narrowing on the right at C3-4 and C4-5, and bilaterally at C5-6. Electronically Signed   By: Nelson Chimes M.D.   On: 05/16/2020 23:47   DG Hand Complete Left  Result Date: 05/17/2020 CLINICAL DATA:  Status post fall. EXAM: LEFT HAND - COMPLETE 3+ VIEW COMPARISON:  01/03/2012 FINDINGS: Previous ORIF of the distal radius. The bones appear diffusely osteopenic. Moderate degenerative changes are noted involving the PIP joints and DIP joints. No fractures or dislocation identified. IMPRESSION: 1. No acute findings. 2. Osteopenia. 3. Moderate osteoarthritis involving the PIP and D IP joints. Electronically Signed   By: Kerby Moors M.D.   On: 05/17/2020 05:31   DG Foot Complete Right  Result Date: 05/16/2020 CLINICAL DATA:  Frequent falls, right foot pain EXAM: RIGHT FOOT COMPLETE - 3+ VIEW COMPARISON:  02/12/2020 FINDINGS: Frontal, oblique, lateral views of the right foot are obtained.  Bones are severely osteopenic. Previous calcaneal avulsion fracture at the Achilles tendon insertion has healed in the interim. There are no acute displaced fractures. Prominent osteoarthritis at the first metatarsophalangeal joint and throughout the interphalangeal joints unchanged. There is severe soft tissue swelling of the right foot, greatest in the dorsal aspect of the forefoot. IMPRESSION: 1. Marked soft tissue swelling greatest in the dorsal aspect of the forefoot. 2. Severe osteopenia without acute  fracture. 3. Prior healed calcaneal avulsion fracture. Electronically Signed   By: Randa Ngo M.D.   On: 05/16/2020 23:42   DG Hip Unilat W or Wo Pelvis 2-3 Views Right  Result Date: 05/17/2020 CLINICAL DATA:  Right hip pain status post fall. EXAM: DG HIP (WITH OR WITHOUT PELVIS) 2-3V RIGHT COMPARISON:  None. FINDINGS: Exam detail is limited due to non anatomic position knee of the right hip. No definite fracture identified. Signs of advanced avascular necrosis with subchondral collapse and secondary osteoarthritis of the right hip is noted. Left hip appears located. Scoliosis and degenerative disc disease identified within the lumbar spine. IMPRESSION: 1. No definite acute findings. If there is high clinical suspicion for occult fracture or the patient refuses to weightbear, consider further evaluation with MRI. Although CT is expeditious, evidence is lacking regarding accuracy of CT over plain film radiography. 2. Signs of advanced avascular necrosis involving the right hip with subchondral collapse and secondary osteoarthritis. Electronically Signed   By: Kerby Moors M.D.   On: 05/17/2020 05:29    Pending Labs Unresulted Labs (From admission, onward)          Start     Ordered   05/18/20 1540  Basic metabolic panel  Tomorrow morning,   R        05/17/20 0724   05/17/20 0725  Creatinine, urine, random  Once,   STAT        05/17/20 0724   05/17/20 0724  Sodium, urine, random  Once,   STAT         05/17/20 0724   05/16/20 2233  Urine culture  ONCE - STAT,   STAT        05/16/20 2232          Vitals/Pain Today's Vitals   05/17/20 1330 05/17/20 1600 05/17/20 1630 05/17/20 1652  BP: (!) 139/53 (!) 159/66 132/72   Pulse: 87 86 84   Resp: 18  18   Temp:      TempSrc:      SpO2: 96% 97% 97%   Weight:      Height:      PainSc:    9     Isolation Precautions Airborne and Contact precautions  Medications Medications  enoxaparin (LOVENOX) injection 30 mg (30 mg Subcutaneous Given 05/17/20 0758)  acetaminophen (TYLENOL) tablet 650 mg (has no administration in time range)    Or  acetaminophen (TYLENOL) suppository 650 mg (has no administration in time range)  0.9 %  sodium chloride infusion ( Intravenous New Bag/Given 05/17/20 1654)  oxyCODONE (Oxy IR/ROXICODONE) immediate release tablet 15 mg (15 mg Oral Given 05/17/20 1653)  acetaminophen (TYLENOL) tablet 650 mg (650 mg Oral Given 05/16/20 2343)  sodium chloride 0.9 % bolus 500 mL (0 mLs Intravenous Stopped 05/17/20 0200)  furosemide (LASIX) injection 20 mg (20 mg Intravenous Given 05/17/20 0530)    Mobility walks with device High fall risk   Focused Assessments .   R Recommendations: See Admitting Provider Note  Report given to:   Additional Notes: n/a

## 2020-05-17 NOTE — ED Notes (Signed)
Pt is unable to stand for orthostatic vitals

## 2020-05-18 LAB — BASIC METABOLIC PANEL
Anion gap: 8 (ref 5–15)
BUN: 25 mg/dL — ABNORMAL HIGH (ref 8–23)
CO2: 32 mmol/L (ref 22–32)
Calcium: 7.9 mg/dL — ABNORMAL LOW (ref 8.9–10.3)
Chloride: 101 mmol/L (ref 98–111)
Creatinine, Ser: 0.72 mg/dL (ref 0.44–1.00)
GFR, Estimated: >60 mL/min (ref >60)
Glucose, Bld: 134 mg/dL — ABNORMAL HIGH (ref 70–99)
Potassium: 3.7 mmol/L (ref 3.5–5.1)
Sodium: 141 mmol/L (ref 135–145)

## 2020-05-18 LAB — SODIUM, URINE, RANDOM: Sodium, Ur: 20 mmol/L

## 2020-05-18 LAB — URINE CULTURE

## 2020-05-18 LAB — TSH: TSH: 5.148 u[IU]/mL — ABNORMAL HIGH (ref 0.350–4.500)

## 2020-05-18 LAB — CREATININE, URINE, RANDOM: Creatinine, Urine: 82.33 mg/dL

## 2020-05-18 MED ORDER — MOMETASONE FURO-FORMOTEROL FUM 100-5 MCG/ACT IN AERO
2.0000 | INHALATION_SPRAY | Freq: Two times a day (BID) | RESPIRATORY_TRACT | Status: DC
  Administered 2020-05-18 – 2020-05-22 (×9): 2 via RESPIRATORY_TRACT
  Filled 2020-05-18: qty 8.8

## 2020-05-18 MED ORDER — MOMETASONE FURO-FORMOTEROL FUM 100-5 MCG/ACT IN AERO
2.0000 | INHALATION_SPRAY | Freq: Two times a day (BID) | RESPIRATORY_TRACT | Status: DC
  Filled 2020-05-18: qty 8.8

## 2020-05-18 MED ORDER — ENOXAPARIN SODIUM 40 MG/0.4ML ~~LOC~~ SOLN
40.0000 mg | SUBCUTANEOUS | Status: DC
  Administered 2020-05-19 – 2020-05-22 (×4): 40 mg via SUBCUTANEOUS
  Filled 2020-05-18 (×3): qty 0.4

## 2020-05-18 MED ORDER — GABAPENTIN 100 MG PO CAPS
100.0000 mg | ORAL_CAPSULE | Freq: Every evening | ORAL | Status: DC | PRN
  Administered 2020-05-19 – 2020-05-20 (×2): 100 mg via ORAL
  Administered 2020-05-21: 300 mg via ORAL
  Filled 2020-05-18: qty 1
  Filled 2020-05-18: qty 2
  Filled 2020-05-18 (×2): qty 1

## 2020-05-18 MED ORDER — ADULT MULTIVITAMIN W/MINERALS CH
1.0000 | ORAL_TABLET | Freq: Every day | ORAL | Status: DC
  Administered 2020-05-18 – 2020-05-22 (×5): 1 via ORAL
  Filled 2020-05-18 (×5): qty 1

## 2020-05-18 MED ORDER — SENNOSIDES-DOCUSATE SODIUM 8.6-50 MG PO TABS
2.0000 | ORAL_TABLET | Freq: Two times a day (BID) | ORAL | Status: DC
  Administered 2020-05-18 – 2020-05-20 (×4): 2 via ORAL
  Filled 2020-05-18 (×4): qty 2

## 2020-05-18 MED ORDER — CARVEDILOL 12.5 MG PO TABS
12.5000 mg | ORAL_TABLET | Freq: Two times a day (BID) | ORAL | Status: DC
  Administered 2020-05-18 – 2020-05-22 (×9): 12.5 mg via ORAL
  Filled 2020-05-18 (×9): qty 1

## 2020-05-18 MED ORDER — AMLODIPINE BESYLATE 5 MG PO TABS
5.0000 mg | ORAL_TABLET | Freq: Every day | ORAL | Status: DC
Start: 2020-05-18 — End: 2020-05-20
  Administered 2020-05-18 – 2020-05-20 (×3): 5 mg via ORAL
  Filled 2020-05-18 (×3): qty 1

## 2020-05-18 MED ORDER — DORZOLAMIDE HCL-TIMOLOL MAL 2-0.5 % OP SOLN
1.0000 [drp] | Freq: Two times a day (BID) | OPHTHALMIC | Status: DC
  Administered 2020-05-18 – 2020-05-22 (×8): 1 [drp] via OPHTHALMIC
  Filled 2020-05-18: qty 10

## 2020-05-18 MED ORDER — CYANOCOBALAMIN 1000 MCG/ML IJ SOLN
1000.0000 ug | Freq: Once | INTRAMUSCULAR | Status: AC
  Administered 2020-05-18: 1000 ug via INTRAMUSCULAR
  Filled 2020-05-18: qty 1

## 2020-05-18 MED ORDER — SORBITOL 70 % SOLN
960.0000 mL | TOPICAL_OIL | Freq: Once | ORAL | Status: DC
  Filled 2020-05-18: qty 473

## 2020-05-18 MED ORDER — VITAMIN B-12 1000 MCG PO TABS
1000.0000 ug | ORAL_TABLET | Freq: Every day | ORAL | Status: DC
  Administered 2020-05-19 – 2020-05-22 (×4): 1000 ug via ORAL
  Filled 2020-05-18 (×5): qty 1

## 2020-05-18 MED ORDER — LEVOTHYROXINE SODIUM 112 MCG PO TABS
112.0000 ug | ORAL_TABLET | Freq: Every day | ORAL | Status: DC
  Administered 2020-05-19 – 2020-05-22 (×4): 112 ug via ORAL
  Filled 2020-05-18 (×4): qty 1

## 2020-05-18 MED ORDER — ASPIRIN EC 81 MG PO TBEC
81.0000 mg | DELAYED_RELEASE_TABLET | Freq: Every day | ORAL | Status: DC
  Administered 2020-05-18 – 2020-05-22 (×5): 81 mg via ORAL
  Filled 2020-05-18 (×5): qty 1

## 2020-05-18 MED ORDER — LATANOPROST 0.005 % OP SOLN
1.0000 [drp] | Freq: Every day | OPHTHALMIC | Status: DC
  Administered 2020-05-18 – 2020-05-21 (×4): 1 [drp] via OPHTHALMIC
  Filled 2020-05-18: qty 2.5

## 2020-05-18 MED ORDER — DULOXETINE HCL 60 MG PO CPEP
60.0000 mg | ORAL_CAPSULE | Freq: Every day | ORAL | Status: DC
Start: 2020-05-18 — End: 2020-05-23
  Administered 2020-05-18 – 2020-05-22 (×5): 60 mg via ORAL
  Filled 2020-05-18 (×5): qty 1

## 2020-05-18 MED ORDER — ALENDRONATE SODIUM 70 MG PO TABS: 70.0000 mg | ORAL_TABLET | ORAL | Status: DC

## 2020-05-18 MED ORDER — POLYETHYLENE GLYCOL 3350 17 G PO PACK: 17.0000 g | PACK | Freq: Every day | ORAL | Status: DC | PRN

## 2020-05-18 MED ORDER — VITAMIN D3 25 MCG (1000 UNIT) PO TABS
1000.0000 [IU] | ORAL_TABLET | Freq: Every day | ORAL | Status: DC
  Administered 2020-05-18 – 2020-05-22 (×5): 1000 [IU] via ORAL
  Filled 2020-05-18 (×5): qty 1

## 2020-05-18 MED ORDER — TRAZODONE HCL 50 MG PO TABS
50.0000 mg | ORAL_TABLET | Freq: Every evening | ORAL | Status: DC | PRN
  Administered 2020-05-19 – 2020-05-22 (×2): 50 mg via ORAL
  Filled 2020-05-18 (×2): qty 1

## 2020-05-18 NOTE — Progress Notes (Signed)
PHARMACIST - PHYSICIAN COMMUNICATION  CONCERNING: P&T Medication Policy Regarding Oral Bisphosphonates  RECOMMENDATION: Order for alendronate (Fosamax), ibandronate (Boniva), or risedronate (Actonel) has been discontinued at this time.  If the patient's post-hospital medical condition warrants safe use of this class of drugs, please resume the pre-hospital regimen upon discharge.  DESCRIPTION:  Alendronate (Fosamax), ibandronate (Boniva), and risedronate (Actonel) can cause severe esophageal erosions in patients who are unable to remain upright at least 30 minutes after taking this medication.   Since brief interruptions in therapy are thought to have minimal impact on bone mineral density, the [facility] has established that bisphosphonate orders should be routinely discontinued during hospitalization.   To override this safety policy and permit administration of Boniva, Fosamax, or Actonel in the hospital, prescribers must write "DO NOT HOLD" in the comments section when placing the order for this class of medications.   Lindell Spar, PharmD, BCPS 05/18/2020 3:31 PM

## 2020-05-18 NOTE — Progress Notes (Signed)
OT Cancellation Note  Patient Details Name: [name] MRN: [id number] DOB: June 16, 1928   Cancelled Treatment:    Reason Eval/Treat Not Completed: Patient declined, no reason specified Patient reports being "worn out" however has not been OOB today, just used bed pan multiple times for diarrhea per nursing. Provided max encouragement and education re: importance of mobility however patient adamantly refusing. Patient has only been home since 1/31, after D/C from rehab and is already falling at home, pt told RN she has just been "laying on the couch." At this point would have to recommend return to SNF due to patient inability to care for herself, may benefit from palliative consult. Will re-attempt 2/21 as schedule permits.  Delbert Phenix OT OT pager: [phone] 05/18/2020, 2:05 PM

## 2020-05-18 NOTE — Progress Notes (Signed)
Patient disimpacted per MD order. Moderate to large amount of stool removed, patient tolerated procedure well. Will continue to monitor.

## 2020-05-18 NOTE — Progress Notes (Signed)
PROGRESS NOTE  LORRAYNE ISMAEL GYI:[id number] DOB: 1928-12-07 DOA: 05/16/2020 PCP: Carolee Rota, NP  HPI/Recap of past 24 hours:  [name] a 87 y.o.femalewith medical history significant ofnonischemic cardiomyopathy status postBiV ICD(downgraded from CRT-D to CRT-Pon 3/50/0938),HWEXHBZ diastolic heart failure, COPD, hypothyroidism, hypertension, hyperlipidemia, Dementia,hospital admission in November 2021 for ankle and calcaneal fractures secondary to a fall presenting to the ED via EMS for evaluation of 3 falls in the past 2 days.Patient states she fell at home but does not exactly remember how, she thinks she might have lost her balance. Denies hitting her head or loss of consciousness. States she is not vaccinated against COVID because she staysat home all the time. Reports having pain all over her body and repeatedly requesting her home oxycodone.   05/18/20:  Main complaint this morning is mid to lower back pain which is chronic.  Concerned for inability to care for herself.  She is receptive to going back to previous SNF at discharge.  Assessment/Plan: Principal Problem:   Frequent falls Active Problems:   Acute encephalopathy   AKI (acute kidney injury) ([place])   Macrocytosis   Chronic pain syndrome  Frequent falls, unclear etiology CT head nonacute. Chest x-ray not consistent with pneumonia COVID-19 screening test negative. UA not strongly suggestive of infection. UA is pending. Continue fall precautions  Resolved nonoliguric AKI. Last urine output recorded 800 cc in the last 24 hours. Renal function is back to baseline Continue to avoid nephrotoxins, dehydration and hypotension  Concern for avascular necrosis of the right hip X-ray showing signs of vascular necrosis with subchondral collapse secondary osteoarthritis. ICD not compatible.  Physical debility Unable to care for self, she lives with her daughter who works full-time. TOC consulted to  assist with SNF placement.  B12 deficiency Repleted Repeat level in the morning.  Hypothyroidism Continue home regimen.  Essential hypertension Continue home regimen  Chronic pain syndrome Continue home regimen  COPD Continue home regimen      Code Status: DNR.  Family Communication: None at bedside.  Disposition Plan: Likely will discharge to SNF.   Consultants:  TOC.  Procedures:  None.  Antimicrobials:  None.  DVT prophylaxis: Subcu Lovenox daily.  Status is: Inpatient   Dispo:  Patient From: Home  Planned Disposition: [place]  Expected discharge date: 05/20/2020  Medically stable for discharge: No, ongoing management of symptomatology.          Objective: Vitals:   05/17/20 2159 05/18/20 0216 05/18/20 0516 05/18/20 1417  BP: 138/78 (!) 142/80 (!) 143/87 (!) 154/64  Pulse: 100 91 92 86  Resp: 15 18 19 20   Temp: 98.1 F (36.7 C) 98.5 F (36.9 C) 99.3 F (37.4 C) 97.9 F (36.6 C)  TempSrc: Oral Oral Oral Oral  SpO2: 93% 90% 96% 97%  Weight:      Height:        Intake/Output Summary (Last 24 hours) at 05/18/2020 1455 Last data filed at 05/18/2020 1300 Gross per 24 hour  Intake 480 ml  Output 800 ml  Net -320 ml   Filed Weights   05/16/20 2136  Weight: 64.4 kg    Exam:  . General: 87 y.o. year-old female well developed well nourished in no acute distress.  Alert and oriented x3. . Cardiovascular: Regular rate and rhythm with no rubs or gallops.  No thyromegaly or JVD noted.   Marland Kitchen Respiratory: Clear to auscultation with no wheezes or rales. Good inspiratory effort. . Abdomen: Soft nontender nondistended with  normal bowel sounds x4 quadrants. . Musculoskeletal: No lower extremity edema bilaterally. . Skin: No ulcerative lesions noted or rashes. . Psychiatry: Mood is appropriate for condition and setting   Data Reviewed: CBC: Recent Labs  Lab 05/16/20 2344  WBC 9.4  HGB 12.5  HCT 40.2  MCV 100.2*  PLT  725   Basic Metabolic Panel: Recent Labs  Lab 05/16/20 2344 05/18/20 0557  NA 140 141  K 4.7 3.7  CL 97* 101  CO2 32 32  GLUCOSE 131* 134*  BUN 39* 25*  CREATININE 1.29* 0.72  CALCIUM 8.9 7.9*   GFR: Estimated Creatinine Clearance: 42 mL/min (by C-G formula based on SCr of 0.72 mg/dL). Liver Function Tests: Recent Labs  Lab 05/16/20 2344  AST 40  ALT 17  ALKPHOS 92  BILITOT 1.0  PROT 7.2  ALBUMIN 3.6   No results for input(s): LIPASE, AMYLASE in the last 168 hours. Recent Labs  Lab 05/17/20 0800  AMMONIA 10   Coagulation Profile: No results for input(s): INR, PROTIME in the last 168 hours. Cardiac Enzymes: No results for input(s): CKTOTAL, CKMB, CKMBINDEX, TROPONINI in the last 168 hours. BNP (last 3 results) No results for input(s): PROBNP in the last 8760 hours. HbA1C: No results for input(s): HGBA1C in the last 72 hours. CBG: No results for input(s): GLUCAP in the last 168 hours. Lipid Profile: No results for input(s): CHOL, HDL, LDLCALC, TRIG, CHOLHDL, LDLDIRECT in the last 72 hours. Thyroid Function Tests: No results for input(s): TSH, T4TOTAL, FREET4, T3FREE, THYROIDAB in the last 72 hours. Anemia Panel: Recent Labs    05/17/20 0800  VITAMINB12 144*  FOLATE 50.1   Urine analysis:    Component Value Date/Time   COLORURINE YELLOW 05/17/2020 0400   APPEARANCEUR CLEAR 05/17/2020 0400   LABSPEC 1.014 05/17/2020 0400   PHURINE 5.0 05/17/2020 0400   GLUCOSEU NEGATIVE 05/17/2020 0400   HGBUR NEGATIVE 05/17/2020 0400   BILIRUBINUR NEGATIVE 05/17/2020 0400   KETONESUR 5 (A) 05/17/2020 0400   PROTEINUR NEGATIVE 05/17/2020 0400   NITRITE NEGATIVE 05/17/2020 0400   LEUKOCYTESUR TRACE (A) 05/17/2020 0400   Sepsis Labs: @LABRCNTIP (procalcitonin:4,lacticidven:4)  ) Recent Results (from the past 240 hour(s))  Urine culture     Status: Abnormal   Collection Time: 05/17/20  4:00 AM   Specimen: Urine, Random  Result Value Ref Range Status   Specimen  Description   Final    URINE, RANDOM Performed at [facility], [place] [address], [place], [place] [address]    Special Requests   Final    NONE Performed at [facility], [place] [address] [place], [place] [address]    Culture MULTIPLE SPECIES PRESENT, SUGGEST RECOLLECTION (A)  Final   Report Status 05/18/2020 FINAL  Final  Resp Panel by RT-PCR (Flu A&B, Covid) Nasopharyngeal Swab     Status: None   Collection Time: 05/17/20  7:16 AM   Specimen: Nasopharyngeal Swab; Nasopharyngeal(NP) swabs in vial transport medium  Result Value Ref Range Status   SARS Coronavirus 2 by RT PCR NEGATIVE NEGATIVE Final    Comment: (NOTE) SARS-CoV-2 target nucleic acids are NOT DETECTED.  The SARS-CoV-2 RNA is generally detectable in upper respiratory specimens during the acute phase of infection. The lowest concentration of SARS-CoV-2 viral copies this assay can detect is 138 copies/mL. A negative result does not preclude SARS-Cov-2 infection and should not be used as the sole basis for treatment or other patient management decisions. A negative result may occur with  improper  specimen collection/handling, submission of specimen other than nasopharyngeal swab, presence of viral mutation(s) within the areas targeted by this assay, and inadequate number of viral copies(<138 copies/mL). A negative result must be combined with clinical observations, patient history, and epidemiological information. The expected result is Negative.  Fact Sheet for Patients:  EntrepreneurPulse.com.au  Fact Sheet for Healthcare Providers:  IncredibleEmployment.be  This test is no t yet approved or cleared by the [place] FDA and  has been authorized for detection and/or diagnosis of SARS-CoV-2 by FDA under an Emergency Use Authorization (EUA). This EUA will remain  in effect (meaning this test can be used) for the duration of the COVID-19  declaration under Section 564(b)(1) of the Act, 21 U.S.C.section 360bbb-3(b)(1), unless the authorization is terminated  or revoked sooner.       Influenza A by PCR NEGATIVE NEGATIVE Final   Influenza B by PCR NEGATIVE NEGATIVE Final    Comment: (NOTE) The Xpert Xpress SARS-CoV-2/FLU/RSV plus assay is intended as an aid in the diagnosis of influenza from Nasopharyngeal swab specimens and should not be used as a sole basis for treatment. Nasal washings and aspirates are unacceptable for Xpert Xpress SARS-CoV-2/FLU/RSV testing.  Fact Sheet for Patients: EntrepreneurPulse.com.au  Fact Sheet for Healthcare Providers: IncredibleEmployment.be  This test is not yet approved or cleared by the [place] FDA and has been authorized for detection and/or diagnosis of SARS-CoV-2 by FDA under an Emergency Use Authorization (EUA). This EUA will remain in effect (meaning this test can be used) for the duration of the COVID-19 declaration under Section 564(b)(1) of the Act, 21 U.S.C. section 360bbb-3(b)(1), unless the authorization is terminated or revoked.  Performed at [facility], [place] [address] [place], [place] [address]       Studies: No results found.  Scheduled Meds: . Chlorhexidine Gluconate Cloth  6 each Topical Daily  . [START ON 05/19/2020] enoxaparin (LOVENOX) injection  40 mg Subcutaneous Q24H  . [START ON 05/19/2020] vitamin B-12  1,000 mcg Oral Daily    Continuous Infusions:   LOS: 1 day     Kayleen Memos, MD [facility] Pager [phone]  If 7PM-7AM, please contact night-coverage [email or web address] Password [facility] 05/18/2020, 2:55 PM

## 2020-05-19 NOTE — Progress Notes (Signed)
OT Cancellation Note  Patient Details Name: [name] MRN: [id number] DOB: [birth year]   Cancelled Treatment:    Reason Eval/Treat Not Completed: Other (comment)Pt adamantly stating she is not going to get up right now she has been too busy this morning and needs to rest. Will try back later as schedule allows.  [place], OTR/L Acute Rehab Services Pager [phone] Office [phone]     Almon Register 05/19/2020, 11:38 AM

## 2020-05-19 NOTE — Progress Notes (Signed)
PROGRESS NOTE  BIANCE [name] FWY:[id number] DOB: 04/08/1928 DOA: 05/16/2020 PCP: Carolee Rota, NP  HPI/Recap of past 24 hours:  Katie Curet Jarvisis a 87 y.o.femalewith medical history significant ofnonischemic cardiomyopathy status postBiV ICD(downgraded from CRT-D to CRT-Pon 2/77/4128),NOMVEHM diastolic heart failure, COPD, hypothyroidism, hypertension, hyperlipidemia, Dementia,hospital admission in November 2021 for ankle and calcaneal fractures secondary to a fall presenting to the ED via EMS for evaluation of 3 falls in the past 2 days.Patient states she fell at home but does not exactly remember how, she thinks she might have lost her balance. Denies hitting her head or loss of consciousness. States she is not vaccinated against COVID because she staysat home all the time. Reports having pain all over her body and repeatedly requesting her home oxycodone.   05/19/20:  Main complaint this morning is regarding her constipation which is being addressed.  Assessment/Plan: Principal Problem:   Frequent falls Active Problems:   Acute encephalopathy   AKI (acute kidney injury) ([place])   Macrocytosis   Chronic pain syndrome  Frequent falls, unclear etiology CT head nonacute. Chest x-ray not consistent with pneumonia COVID-19 screening test negative. UA not strongly suggestive of infection. UA is pending. Continue fall precautions  Resolved nonoliguric AKI. Last urine output recorded 800 cc in the last 24 hours. Renal function is back to baseline Continue to avoid nephrotoxins, dehydration and hypotension  Concern for avascular necrosis of the right hip X-ray showing signs of vascular necrosis with subchondral collapse secondary osteoarthritis. ICD not compatible. Pain control as needed  Physical debility Unable to care for self, she lives with her daughter who works full-time. TOC consulted to assist with SNF placement.  B12 deficiency Repleted Repeat level in the  morning.  Hypothyroidism Continue home regimen.  Essential hypertension Continue home regimen  Chronic pain syndrome Continue home regimen  COPD Continue home regimen  Chronic constipation with impaction She was disimpacted on 05/18/2020 Continue bowel regimen  Ambulatory dysfunction She has declined PT OT Continue to encourage mobility Continue fall precautions.      Code Status: DNR.  Family Communication: None at bedside.  Disposition Plan: Likely will discharge to SNF.   Consultants:  TOC.  Procedures:  None.  Antimicrobials:  None.  DVT prophylaxis: Subcu Lovenox daily.  Status is: Inpatient   Dispo:  Patient From: Home  Planned Disposition: [place]  Expected discharge date: 05/20/2020.  Medically stable for discharge: Yes, ongoing management of symptomatology.          Objective: Vitals:   05/18/20 2134 05/19/20 0300 05/19/20 0754 05/19/20 1438  BP: 129/60 (!) 141/87  (!) 169/82  Pulse: 76   69  Resp: 15   19  Temp: 98 F (36.7 C) 97.6 F (36.4 C)  98.4 F (36.9 C)  TempSrc: Oral Axillary  Oral  SpO2: 94% 96% 94% 97%  Weight:      Height:        Intake/Output Summary (Last 24 hours) at 05/19/2020 1827 Last data filed at 05/19/2020 1623 Gross per 24 hour  Intake 480 ml  Output 902 ml  Net -422 ml   Filed Weights   05/16/20 2136  Weight: 64.4 kg    Exam:  . General: 87 y.o. year-old female well-developed well-nourished in no acute distress.  Alert oriented x3.   . Cardiovascular: Regular rate and rhythm no rubs or gallops.   Marland Kitchen Respiratory: Clear to auscultation no wheezing no rales.   . Abdomen: Soft nontender normal bowel sounds present.   Marland Kitchen  Musculoskeletal: No lower extremity edema bilaterally.   . Skin: No ulcerative lesions or bruising in lower extremities. Marland Kitchen Psychiatry: Mood is appropriate for condition and setting.   Data Reviewed: CBC: Recent Labs  Lab 05/16/20 2344  WBC 9.4  HGB 12.5   HCT 40.2  MCV 100.2*  PLT 476   Basic Metabolic Panel: Recent Labs  Lab 05/16/20 2344 05/18/20 0557  NA 140 141  K 4.7 3.7  CL 97* 101  CO2 32 32  GLUCOSE 131* 134*  BUN 39* 25*  CREATININE 1.29* 0.72  CALCIUM 8.9 7.9*   GFR: Estimated Creatinine Clearance: 42 mL/min (by C-G formula based on SCr of 0.72 mg/dL). Liver Function Tests: Recent Labs  Lab 05/16/20 2344  AST 40  ALT 17  ALKPHOS 92  BILITOT 1.0  PROT 7.2  ALBUMIN 3.6   No results for input(s): LIPASE, AMYLASE in the last 168 hours. Recent Labs  Lab 05/17/20 0800  AMMONIA 10   Coagulation Profile: No results for input(s): INR, PROTIME in the last 168 hours. Cardiac Enzymes: No results for input(s): CKTOTAL, CKMB, CKMBINDEX, TROPONINI in the last 168 hours. BNP (last 3 results) No results for input(s): PROBNP in the last 8760 hours. HbA1C: No results for input(s): HGBA1C in the last 72 hours. CBG: No results for input(s): GLUCAP in the last 168 hours. Lipid Profile: No results for input(s): CHOL, HDL, LDLCALC, TRIG, CHOLHDL, LDLDIRECT in the last 72 hours. Thyroid Function Tests: Recent Labs    05/18/20 1544  TSH 5.148*   Anemia Panel: Recent Labs    05/17/20 0800  VITAMINB12 144*  FOLATE 50.1   Urine analysis:    Component Value Date/Time   COLORURINE YELLOW 05/17/2020 0400   APPEARANCEUR CLEAR 05/17/2020 0400   LABSPEC 1.014 05/17/2020 0400   PHURINE 5.0 05/17/2020 0400   GLUCOSEU NEGATIVE 05/17/2020 0400   HGBUR NEGATIVE 05/17/2020 0400   BILIRUBINUR NEGATIVE 05/17/2020 0400   KETONESUR 5 (A) 05/17/2020 0400   PROTEINUR NEGATIVE 05/17/2020 0400   NITRITE NEGATIVE 05/17/2020 0400   LEUKOCYTESUR TRACE (A) 05/17/2020 0400   Sepsis Labs: @LABRCNTIP (procalcitonin:4,lacticidven:4)  ) Recent Results (from the past 240 hour(s))  Urine culture     Status: Abnormal   Collection Time: 05/17/20  4:00 AM   Specimen: Urine, Random  Result Value Ref Range Status   Specimen Description    Final    URINE, RANDOM Performed at [facility], [place] [address], [place], [place] [address]    Special Requests   Final    NONE Performed at [facility], [place] [address], [place], [place] [address]    Culture MULTIPLE SPECIES PRESENT, SUGGEST RECOLLECTION (A)  Final   Report Status 05/18/2020 FINAL  Final  Resp Panel by RT-PCR (Flu A&B, Covid) Nasopharyngeal Swab     Status: None   Collection Time: 05/17/20  7:16 AM   Specimen: Nasopharyngeal Swab; Nasopharyngeal(NP) swabs in vial transport medium  Result Value Ref Range Status   SARS Coronavirus 2 by RT PCR NEGATIVE NEGATIVE Final    Comment: (NOTE) SARS-CoV-2 target nucleic acids are NOT DETECTED.  The SARS-CoV-2 RNA is generally detectable in upper respiratory specimens during the acute phase of infection. The lowest concentration of SARS-CoV-2 viral copies this assay can detect is 138 copies/mL. A negative result does not preclude SARS-Cov-2 infection and should not be used as the sole basis for treatment or other patient management decisions. A negative result may occur with  improper specimen collection/handling, submission of  specimen other than nasopharyngeal swab, presence of viral mutation(s) within the areas targeted by this assay, and inadequate number of viral copies(<138 copies/mL). A negative result must be combined with clinical observations, patient history, and epidemiological information. The expected result is Negative.  Fact Sheet for Patients:  EntrepreneurPulse.com.au  Fact Sheet for Healthcare Providers:  IncredibleEmployment.be  This test is no t yet approved or cleared by the [place] FDA and  has been authorized for detection and/or diagnosis of SARS-CoV-2 by FDA under an Emergency Use Authorization (EUA). This EUA will remain  in effect (meaning this test can be used) for the duration of the COVID-19 declaration under  Section 564(b)(1) of the Act, 21 U.S.C.section 360bbb-3(b)(1), unless the authorization is terminated  or revoked sooner.       Influenza A by PCR NEGATIVE NEGATIVE Final   Influenza B by PCR NEGATIVE NEGATIVE Final    Comment: (NOTE) The Xpert Xpress SARS-CoV-2/FLU/RSV plus assay is intended as an aid in the diagnosis of influenza from Nasopharyngeal swab specimens and should not be used as a sole basis for treatment. Nasal washings and aspirates are unacceptable for Xpert Xpress SARS-CoV-2/FLU/RSV testing.  Fact Sheet for Patients: EntrepreneurPulse.com.au  Fact Sheet for Healthcare Providers: IncredibleEmployment.be  This test is not yet approved or cleared by the [place] FDA and has been authorized for detection and/or diagnosis of SARS-CoV-2 by FDA under an Emergency Use Authorization (EUA). This EUA will remain in effect (meaning this test can be used) for the duration of the COVID-19 declaration under Section 564(b)(1) of the Act, 21 U.S.C. section 360bbb-3(b)(1), unless the authorization is terminated or revoked.  Performed at [facility], [place] [address], [place],  [address]       Studies: No results found.  Scheduled Meds: . amLODipine  5 mg Oral Daily  . aspirin EC  81 mg Oral Daily  . carvedilol  12.5 mg Oral BID WC  . Chlorhexidine Gluconate Cloth  6 each Topical Daily  . cholecalciferol  1,000 Units Oral Daily  . dorzolamide-timolol  1 drop Both Eyes BID  . DULoxetine  60 mg Oral Daily  . enoxaparin (LOVENOX) injection  40 mg Subcutaneous Q24H  . latanoprost  1 drop Both Eyes QHS  . levothyroxine  112 mcg Oral QAC breakfast  . mometasone-formoterol  2 puff Inhalation BID  . multivitamin with minerals  1 tablet Oral Daily  . senna-docusate  2 tablet Oral BID  . vitamin B-12  1,000 mcg Oral Daily    Continuous Infusions:   LOS: 2 days     Kayleen Memos, MD [facility] Pager [phone]  If 7PM-7AM, please contact night-coverage [email or web address] Password [facility] 05/19/2020, 6:27 PM

## 2020-05-19 NOTE — Progress Notes (Signed)
PT Cancellation Note  Patient Details Name: [name] MRN: [id number] DOB: 1928-11-16   Cancelled Treatment:      Reason Eval/Treat Not Completed: Other (comment)Pt adamantly stating she is not going to get up right now she has been too busy this morning and needs to rest. Will try back later as schedule allows.   Nykole Matos 05/19/2020, 1:41 PM

## 2020-05-20 LAB — CBC
HCT: 32.8 % — ABNORMAL LOW (ref 36.0–46.0)
Hemoglobin: 10.1 g/dL — ABNORMAL LOW (ref 12.0–15.0)
MCH: 31.4 pg (ref 26.0–34.0)
MCHC: 30.8 g/dL (ref 30.0–36.0)
MCV: 101.9 fL — ABNORMAL HIGH (ref 80.0–100.0)
Platelets: 145 10*3/uL — ABNORMAL LOW (ref 150–400)
RBC: 3.22 MIL/uL — ABNORMAL LOW (ref 3.87–5.11)
RDW: 13.6 % (ref 11.5–15.5)
WBC: 6.2 10*3/uL (ref 4.0–10.5)
nRBC: 0 % (ref 0.0–0.2)

## 2020-05-20 LAB — BASIC METABOLIC PANEL
Anion gap: 8 (ref 5–15)
BUN: 15 mg/dL (ref 8–23)
CO2: 33 mmol/L — ABNORMAL HIGH (ref 22–32)
Calcium: 8.4 mg/dL — ABNORMAL LOW (ref 8.9–10.3)
Chloride: 97 mmol/L — ABNORMAL LOW (ref 98–111)
Creatinine, Ser: 0.67 mg/dL (ref 0.44–1.00)
GFR, Estimated: >60 mL/min (ref >60)
Glucose, Bld: 105 mg/dL — ABNORMAL HIGH (ref 70–99)
Potassium: 4.2 mmol/L (ref 3.5–5.1)
Sodium: 138 mmol/L (ref 135–145)

## 2020-05-20 LAB — T4, FREE: Free T4: 0.63 ng/dL (ref 0.61–1.12)

## 2020-05-20 LAB — PHOSPHORUS: Phosphorus: 2.6 mg/dL (ref 2.5–4.6)

## 2020-05-20 LAB — MAGNESIUM: Magnesium: 2.2 mg/dL (ref 1.7–2.4)

## 2020-05-20 MED ORDER — AMLODIPINE BESYLATE 10 MG PO TABS
10.0000 mg | ORAL_TABLET | Freq: Every day | ORAL | Status: DC
  Administered 2020-05-21 – 2020-05-22 (×3): 10 mg via ORAL
  Filled 2020-05-20 (×3): qty 1

## 2020-05-20 MED ORDER — SENNOSIDES-DOCUSATE SODIUM 8.6-50 MG PO TABS
1.0000 | ORAL_TABLET | Freq: Every day | ORAL | Status: DC
  Administered 2020-05-22: 1 via ORAL
  Filled 2020-05-20: qty 1

## 2020-05-20 MED ORDER — AMLODIPINE BESYLATE 5 MG PO TABS
5.0000 mg | ORAL_TABLET | Freq: Once | ORAL | Status: AC
  Administered 2020-05-20: 5 mg via ORAL
  Filled 2020-05-20: qty 1

## 2020-05-20 NOTE — Evaluation (Signed)
Physical Therapy Evaluation Patient Details Name: [name] MRN: [id number] DOB: 12-Jun-1928 Today's Date: 05/20/2020   History of Present Illness  [name] is a 87 y.o. female with medical history significant of nonischemic cardiomyopathy status post BiV ICD, chronic diastolic heart failure, COPD, hypothyroidism, hypertension, hyperlipidemia, Dementia, hospital admission in November 2021 for ankle and calcaneal fractures secondary to a fall presenting to the ED via EMS for evaluation of 3 falls in the past 2 days.  Clinical Impression  Pt with recent falls at home, and unclear of family;s ability to assist and pt's PLOF. Pt currently with very little AROM for DF B ankles and very weak planter flexion as well. States she was walking using a rollator, but also states I didn't move much I just laid around. Pt does have a great fear of falling however was able to transfer to a [facility] and also to the recliner today with min-ModA ( 2 person for safety and due to patient's fear). Will continue to follow while here.     Follow Up Recommendations SNF (depending on pt's prgress and what family is able to assist with in home. Will need 24/7 and assist ability. unclear of PLOF.)    Equipment Recommendations  None recommended by PT    Recommendations for Other Services       Precautions / Restrictions Precautions Precautions: Fall Restrictions Weight Bearing Restrictions: No      Mobility  Bed Mobility Overal bed mobility: Needs Assistance Bed Mobility: Supine to Sit;Sit to Supine     Supine to sit: Min assist Sit to supine: Min assist        Transfers   Equipment used: Rolling walker (2 wheeled)             General transfer comment: si to stand 2 times at EOB then steps to pivot to [facility], [facility], [facility] . Pt with great fear of falling thereofre more secure with 2 person assisting for transfer. then trasnfer with about 5 steps to pivot to the recliner.  Ambulation/Gait                 Stairs            Wheelchair Mobility    Modified Rankin (Stroke Patients Only)       Balance Overall balance assessment: Needs assistance Sitting-balance support: No upper extremity supported;Feet supported Sitting balance-Leahy Scale: Fair     Standing balance support: During functional activity;Bilateral upper extremity supported Standing balance-Leahy Scale: Poor                               Pertinent Vitals/Pain Pain Assessment: 0-10 Pain Score: 6  Pain Location: my back and all over, I do better with therapy if I take pain medicine first Pain Descriptors / Indicators: Aching;Sore Pain Intervention(s): Monitored during session;RN gave pain meds during session    [place] expects to be discharged to:: Private residence Living Arrangements: Children Available Help at Discharge: Family;Available PRN/intermittently Type of Home: House Home Access: Stairs to enter Entrance Stairs-Rails: Right;Left;Can reach both Entrance Stairs-Number of Steps: 2 Home Layout: Two level;Able to live on main level with bedroom/bathroom Home Equipment: Walker - 4 wheels;Shower seat;Grab bars - tub/shower;Grab bars - toilet;Cane - single point Additional Comments: Pt reports lives with daughter and granddaughter (but granddaughter unable to assist).  Daughter works.    Prior Function Level of Independence: Independent with assistive device(s);Needs assistance   Gait /  Transfers Assistance Needed: Reports could ambulate in home and short community distances with rollator or RW; several recent falls  ADL's / Homemaking Assistance Needed: Reports able to do ADLs but with "great difficulty"and increased time; family assisted with IADLs  Comments: rollator; pt reports able to do ADLs but was difficult     Hand Dominance   Dominant Hand: Right    Extremity/Trunk Assessment        Lower Extremity Assessment Lower Extremity Assessment: Generalized  weakness;RLE deficits/detail;LLE deficits/detail RLE Deficits / Details: 1/5 acitve DF and 3-/5 PF, knee extension 3-/5 , hip flexion 3-/5 LLE Deficits / Details: 1/5 acitve DF and 3-/5 PF, knee extension 3-/5 , hip flexion 3-/5       Communication   Communication: No difficulties  Cognition     Overall Cognitive Status: Within Functional Limits for tasks assessed                                 General Comments: unsure of cognitive status at baseline, pt was a little unclear of her PLOF      General Comments      Exercises     Assessment/Plan    PT Assessment Patient needs continued PT services  PT Problem List Decreased strength;Decreased range of motion;Decreased activity tolerance;Decreased balance;Decreased mobility       PT Treatment Interventions Gait training;DME instruction;Functional mobility training;Therapeutic activities;Patient/family education;Therapeutic exercise    PT Goals (Current goals can be found in the Care Plan section)  Acute Rehab PT Goals Patient Stated Goal: I would lie to be able to go home PT Goal Formulation: With patient Time For Goal Achievement: 06/02/20 Potential to Achieve Goals: Good    Frequency Min 3X/week   Barriers to discharge        Co-evaluation               AM-PAC PT "6 Clicks" Mobility  Outcome Measure Help needed turning from your back to your side while in a flat bed without using bedrails?: A Little Help needed moving from lying on your back to sitting on the side of a flat bed without using bedrails?: A Little Help needed moving to and from a bed to a chair (including a wheelchair)?: A Lot Help needed standing up from a chair using your arms (e.g., wheelchair or bedside chair)?: A Lot Help needed to walk in hospital room?: A Lot Help needed climbing 3-5 steps with a railing? : A Lot 6 Click Score: 14    End of Session Equipment Utilized During Treatment: Gait belt Activity Tolerance:  Patient tolerated treatment well Patient left: in chair;with call bell/phone within reach;with chair alarm set Nurse Communication: Mobility status PT Visit Diagnosis: Unsteadiness on feet (R26.81);Repeated falls (R29.6);Muscle weakness (generalized) (M62.81)    Time: 2956-2130 PT Time Calculation (min) (ACUTE ONLY): 40 min   Charges:   PT Evaluation $PT Eval Low Complexity: 1 Low PT Treatments $Therapeutic Activity: 23-37 mins        Morrigan Wickens, PT, MPT Acute Rehabilitation Services Office: [phone] Pager: [phone] 05/20/2020   Clide Dales 05/20/2020, 9:22 PM

## 2020-05-20 NOTE — Progress Notes (Signed)
PROGRESS NOTE  [name] YSA:[id number] DOB: Oct 09, 1928 DOA: 05/16/2020 PCP: Carolee Rota, NP  HPI/Recap of past 24 hours:  [name] a 87 y.o.femalewith medical history significant ofnonischemic cardiomyopathy status postBiV ICD(downgraded from CRT-D to CRT-Pon 06/19/5571),UKGURKY diastolic heart failure, COPD, hypothyroidism, hypertension, hyperlipidemia, Dementia,hospital admission in November 2021 for ankle and calcaneal fractures secondary to a fall presenting to the ED via EMS for evaluation of 3 falls in the past 2 days.Patient states she fell at home but does not exactly remember how, she thinks she might have lost her balance. Denies hitting her head or loss of consciousness. States she is not vaccinated against COVID because she staysat home all the time. Reports having pain all over her body and repeatedly requesting her home oxycodone.   05/20/20:  Feels better this AM.  She agrees that she will work with PT and OT.  Evaluation is pending prior to SNF placement.  Assessment/Plan: Principal Problem:   Frequent falls Active Problems:   Acute encephalopathy   AKI (acute kidney injury) ([place])   Macrocytosis   Chronic pain syndrome  Frequent falls, unclear etiology CT head on admission nonacute. Chest x-ray not consistent with pneumonia COVID-19 screening test negative. No UTI. Continue fall precautions PT OT to assess, pending evaluation prior to SNF placement.  Resolved nonoliguric AKI. Her creatinine is back to her baseline 0.6 with GFR greater than 60 Continue to avoid nephrotoxins, dehydration and hypotension  Concern for avascular necrosis of the right hip X-ray showing signs of vascular necrosis with subchondral collapse secondary osteoarthritis. ICD not compatible with MRI. Pain control as needed She is on chronic opiate use  Physical debility Unable to care for self, she lives with her daughter who works full-time. TOC consulted to  assist with SNF placement. Pending PT OT assessment.  Declined PT OT assessment yesterday, stated she was too tired.  Receptive to physical evaluation today.  B12 deficiency Repleted Repeat B12 level in the morning.  Hypothyroidism Elevated TSH 5.148, free T4 normal at 0.63 Continue home dose of levothyroxine.  Essential hypertension BP is not at goal Increase Norvasc to 10 mg daily Continue to monitor vital signs.  Chronic pain syndrome Continue home regimen  COPD Continue home regimen  Chronic constipation with impaction She was disimpacted on 05/18/2020 Continue bowel regimen  Ambulatory dysfunction Pending PT OT assessment. Continue to encourage mobility Continue fall precautions.      Code Status: DNR.  Family Communication: None at bedside.  Disposition Plan: Likely will discharge to SNF.   Consultants:  TOC.  Procedures:  None.  Antimicrobials:  None.  DVT prophylaxis: Subcu Lovenox daily.  Status is: Inpatient   Dispo:  Patient From: Home  Planned Disposition: [place]  Expected discharge date: 05/21/2020.  Medically stable for discharge: Yes      Objective: Vitals:   05/19/20 1944 05/19/20 2154 05/20/20 0757 05/20/20 1428  BP:  140/74  (!) 170/77  Pulse:  66  72  Resp:  17  16  Temp:  98.3 F (36.8 C)  98.8 F (37.1 C)  TempSrc:  Oral  Oral  SpO2: 97% 96% 93% 95%  Weight:      Height:        Intake/Output Summary (Last 24 hours) at 05/20/2020 1604 Last data filed at 05/20/2020 1300 Gross per 24 hour  Intake 480 ml  Output 1000 ml  Net -520 ml   Filed Weights   05/16/20 2136  Weight: 64.4 kg  Exam:  . General: 87 y.o. year-old female well-developed well-nourished in no distress.  She is alert and oriented x3.   . Cardiovascular: Regular Rate and Rhythm No Rubs or Gallops.   Marland Kitchen Respiratory: Clear to auscultation no wheezes no rales.   . Abdomen: Soft nontender normal bowel sounds  present . Musculoskeletal: No lower extremity edema bilaterally. . Skin: Bruising involving lower extremities and upper extremities bilaterally. Marland Kitchen Psychiatry: Mood is appropriate for condition and setting.   Data Reviewed: CBC: Recent Labs  Lab 05/16/20 2344 05/20/20 0734  WBC 9.4 6.2  HGB 12.5 10.1*  HCT 40.2 32.8*  MCV 100.2* 101.9*  PLT 151 573*   Basic Metabolic Panel: Recent Labs  Lab 05/16/20 2344 05/18/20 0557 05/20/20 0734  NA 140 141 138  K 4.7 3.7 4.2  CL 97* 101 97*  CO2 32 32 33*  GLUCOSE 131* 134* 105*  BUN 39* 25* 15  CREATININE 1.29* 0.72 0.67  CALCIUM 8.9 7.9* 8.4*  MG  --   --  2.2  PHOS  --   --  2.6   GFR: Estimated Creatinine Clearance: 42 mL/min (by C-G formula based on SCr of 0.67 mg/dL). Liver Function Tests: Recent Labs  Lab 05/16/20 2344  AST 40  ALT 17  ALKPHOS 92  BILITOT 1.0  PROT 7.2  ALBUMIN 3.6   No results for input(s): LIPASE, AMYLASE in the last 168 hours. Recent Labs  Lab 05/17/20 0800  AMMONIA 10   Coagulation Profile: No results for input(s): INR, PROTIME in the last 168 hours. Cardiac Enzymes: No results for input(s): CKTOTAL, CKMB, CKMBINDEX, TROPONINI in the last 168 hours. BNP (last 3 results) No results for input(s): PROBNP in the last 8760 hours. HbA1C: No results for input(s): HGBA1C in the last 72 hours. CBG: No results for input(s): GLUCAP in the last 168 hours. Lipid Profile: No results for input(s): CHOL, HDL, LDLCALC, TRIG, CHOLHDL, LDLDIRECT in the last 72 hours. Thyroid Function Tests: Recent Labs    05/18/20 1544 05/20/20 0734  TSH 5.148*  --   FREET4  --  0.63   Anemia Panel: No results for input(s): VITAMINB12, FOLATE, FERRITIN, TIBC, IRON, RETICCTPCT in the last 72 hours. Urine analysis:    Component Value Date/Time   COLORURINE YELLOW 05/17/2020 0400   APPEARANCEUR CLEAR 05/17/2020 0400   LABSPEC 1.014 05/17/2020 0400   PHURINE 5.0 05/17/2020 0400   GLUCOSEU NEGATIVE 05/17/2020  0400   HGBUR NEGATIVE 05/17/2020 0400   BILIRUBINUR NEGATIVE 05/17/2020 0400   KETONESUR 5 (A) 05/17/2020 0400   PROTEINUR NEGATIVE 05/17/2020 0400   NITRITE NEGATIVE 05/17/2020 0400   LEUKOCYTESUR TRACE (A) 05/17/2020 0400   Sepsis Labs: @LABRCNTIP (procalcitonin:4,lacticidven:4)  ) Recent Results (from the past 240 hour(s))  Urine culture     Status: Abnormal   Collection Time: 05/17/20  4:00 AM   Specimen: Urine, Random  Result Value Ref Range Status   Specimen Description   Final    URINE, RANDOM Performed at [facility], [place] [address], [place], [place] [address]    Special Requests   Final    NONE Performed at [facility], [place] [address] [place], [place] [address]    Culture MULTIPLE SPECIES PRESENT, SUGGEST RECOLLECTION (A)  Final   Report Status 05/18/2020 FINAL  Final  Resp Panel by RT-PCR (Flu A&B, Covid) Nasopharyngeal Swab     Status: None   Collection Time: 05/17/20  7:16 AM   Specimen: Nasopharyngeal Swab; Nasopharyngeal(NP) swabs in vial  transport medium  Result Value Ref Range Status   SARS Coronavirus 2 by RT PCR NEGATIVE NEGATIVE Final    Comment: (NOTE) SARS-CoV-2 target nucleic acids are NOT DETECTED.  The SARS-CoV-2 RNA is generally detectable in upper respiratory specimens during the acute phase of infection. The lowest concentration of SARS-CoV-2 viral copies this assay can detect is 138 copies/mL. A negative result does not preclude SARS-Cov-2 infection and should not be used as the sole basis for treatment or other patient management decisions. A negative result may occur with  improper specimen collection/handling, submission of specimen other than nasopharyngeal swab, presence of viral mutation(s) within the areas targeted by this assay, and inadequate number of viral copies(<138 copies/mL). A negative result must be combined with clinical observations, patient history, and epidemiological information.  The expected result is Negative.  Fact Sheet for Patients:  EntrepreneurPulse.com.au  Fact Sheet for Healthcare Providers:  IncredibleEmployment.be  This test is no t yet approved or cleared by the [place] FDA and  has been authorized for detection and/or diagnosis of SARS-CoV-2 by FDA under an Emergency Use Authorization (EUA). This EUA will remain  in effect (meaning this test can be used) for the duration of the COVID-19 declaration under Section 564(b)(1) of the Act, 21 U.S.C.section 360bbb-3(b)(1), unless the authorization is terminated  or revoked sooner.       Influenza A by PCR NEGATIVE NEGATIVE Final   Influenza B by PCR NEGATIVE NEGATIVE Final    Comment: (NOTE) The Xpert Xpress SARS-CoV-2/FLU/RSV plus assay is intended as an aid in the diagnosis of influenza from Nasopharyngeal swab specimens and should not be used as a sole basis for treatment. Nasal washings and aspirates are unacceptable for Xpert Xpress SARS-CoV-2/FLU/RSV testing.  Fact Sheet for Patients: EntrepreneurPulse.com.au  Fact Sheet for Healthcare Providers: IncredibleEmployment.be  This test is not yet approved or cleared by the [place] FDA and has been authorized for detection and/or diagnosis of SARS-CoV-2 by FDA under an Emergency Use Authorization (EUA). This EUA will remain in effect (meaning this test can be used) for the duration of the COVID-19 declaration under Section 564(b)(1) of the Act, 21 U.S.C. section 360bbb-3(b)(1), unless the authorization is terminated or revoked.  Performed at [facility], [place] [address] [place], [place] [address]       Studies: No results found.  Scheduled Meds: . amLODipine  5 mg Oral Daily  . aspirin EC  81 mg Oral Daily  . carvedilol  12.5 mg Oral BID WC  . Chlorhexidine Gluconate Cloth  6 each Topical Daily  . cholecalciferol  1,000 Units Oral  Daily  . dorzolamide-timolol  1 drop Both Eyes BID  . DULoxetine  60 mg Oral Daily  . enoxaparin (LOVENOX) injection  40 mg Subcutaneous Q24H  . latanoprost  1 drop Both Eyes QHS  . levothyroxine  112 mcg Oral QAC breakfast  . mometasone-formoterol  2 puff Inhalation BID  . multivitamin with minerals  1 tablet Oral Daily  . senna-docusate  2 tablet Oral BID  . vitamin B-12  1,000 mcg Oral Daily    Continuous Infusions:   LOS: 3 days     Kayleen Memos, MD [facility] Pager [phone]  If 7PM-7AM, please contact night-coverage [email or web address] Password [facility] 05/20/2020, 4:04 PM

## 2020-05-20 NOTE — Progress Notes (Signed)
PT Note  Patient Details Name: [name] MRN: [id number] DOB: January 18, 1929       PT assessment complete, full write up to follow. Pt with great fear of falling and required encouragement for OOB activity, however was happy she did so after the assessment. Pt still having diarrhea with movement and also embarrassed about this. Stood pivot with RW to [facility] and took some pivot steps with RW to recliner as well . Min/ModA of 2 due to pt's fear. Pt also has 1/5 B DF so unsure what her baseline mobility really was . Recommending continued rehab at SNF due to unsure of how often family is there to help care for her. Pt would prefer to go home if able but understands she is not doing a lot at this time and needs to get stronger.    Clide Dales 05/20/2020, 4:43 PM  Gatha Mayer, PT, MPT Acute Rehabilitation Services Office: [phone] Pager: [phone] 05/20/2020

## 2020-05-20 NOTE — Care Management Important Message (Signed)
Important Message  Patient Details IM Letter given to the Patient. Name: [name] MRN: [id number] Date of Birth: 1928/09/19   Medicare Important Message Given:  Yes     Kerin Salen 05/20/2020, 10:01 AM

## 2020-05-21 DIAGNOSIS — D7589 Other specified diseases of blood and blood-forming organs: Secondary | ICD-10-CM

## 2020-05-21 DIAGNOSIS — G894 Chronic pain syndrome: Secondary | ICD-10-CM

## 2020-05-21 DIAGNOSIS — N179 Acute kidney failure, unspecified: Principal | ICD-10-CM

## 2020-05-21 DIAGNOSIS — G9340 Encephalopathy, unspecified: Secondary | ICD-10-CM

## 2020-05-21 LAB — VITAMIN B12: Vitamin B-12: 933 pg/mL — ABNORMAL HIGH (ref 180–914)

## 2020-05-21 NOTE — TOC Initial Note (Signed)
Transition of Care [facility]) - Initial/Assessment Note    Patient Details  Name: [name] MRN: [id number] Date of Birth: 1929/02/01  [facility]) CM/SW Contact:    Lynnell Catalan, RN Phone Number: 05/21/2020, 2:21 PM  Clinical Narrative:                 Spoke with pt at bedside for dc planning. Went over PT recommendations with pt. She states that she had recently been at [facility]) SNF and wouldn't mind going back there. She gave permission to fax out [place] for facility beds. She did ask that I call her daughter. Left message on daughter's VM with no return call. TOC will continue to follow.  Expected Discharge Plan: Skilled Nursing Facility Barriers to Discharge: Continued Medical Work up   Patient Goals and CMS Choice Patient states their goals for this hospitalization and ongoing recovery are:: To get home      Expected Discharge Plan and Services Expected Discharge Plan: [facility]   Discharge Planning Services: CM Consult   Living arrangements for the past 2 months: Single Family Home Expected Discharge Date: 05/19/20                                    Prior Living Arrangements/Services Living arrangements for the past 2 months: Single Family Home Lives with:: Adult Children Patient language and need for interpreter reviewed:: Yes        Need for Family Participation in Patient Care: Yes (Comment) Care giver support system in place?: Yes (comment)   Criminal Activity/Legal Involvement Pertinent to Current Situation/Hospitalization: No - Comment as needed  Activities of Daily Living Home Assistive Devices/Equipment: Walker (specify type),Wheelchair ADL Screening (condition at time of admission) Patient's cognitive ability adequate to safely complete daily activities?: No Is the patient deaf or have difficulty hearing?: No Does the patient have difficulty seeing, even when wearing glasses/contacts?: No Does the  patient have difficulty concentrating, remembering, or making decisions?: Yes Patient able to express need for assistance with ADLs?: Yes Does the patient have difficulty dressing or bathing?: Yes Independently performs ADLs?: No Communication: Independent Dressing (OT): Dependent Is this a change from baseline?: Pre-admission baseline Grooming: Dependent Is this a change from baseline?: Pre-admission baseline Feeding: Independent Bathing: Dependent Is this a change from baseline?: Change from baseline, expected to last <3 days Toileting: Dependent Is this a change from baseline?: Pre-admission baseline In/Out Bed: Dependent Is this a change from baseline?: Pre-admission baseline Walks in Home: Dependent Is this a change from baseline?: Pre-admission baseline Does the patient have difficulty walking or climbing stairs?: Yes Weakness of Legs: Both Weakness of Arms/Hands: Both  Permission Sought/Granted                  Emotional Assessment Appearance:: Appears stated age Attitude/Demeanor/Rapport: Gracious Affect (typically observed): Calm Orientation: : Oriented to Self,Oriented to Place,Oriented to  Time,Oriented to Situation Alcohol / Substance Use: Not Applicable    Admission diagnosis:  Urinary retention [R33.9] Acute encephalopathy [G93.40] AKI (acute kidney injury) ([place]) [N17.9] Frequent falls [R29.6] Repeated falls [R29.6] Patient Active Problem List   Diagnosis Date Noted  . Frequent falls 05/17/2020  . Macrocytosis 05/17/2020  . Chronic pain syndrome 05/17/2020  . Acute on chronic heart failure with preserved ejection fraction (HFpEF) ([place]) 02/11/2020  . Essential hypertension 02/11/2020  . Fracture of left ankle, closed, initial encounter 02/11/2020  .  Pacemaker 02/11/2020  . Fall at home, initial encounter 02/11/2020  . Prolonged QT interval 02/11/2020  . ICD (implantable cardioverter-defibrillator) battery depletion   . Pacemaker battery depletion  09/09/2019  . Acute respiratory failure ([place]) 03/15/2019  . COPD exacerbation ([place]) 03/15/2019  . Acute respiratory failure with hypoxia ([place]) 03/14/2019  . COPD with acute exacerbation ([place]) 03/14/2019  . Digoxin toxicity 10/04/2017  . Closed fracture of distal end of radius 06/29/2017  . Acute encephalopathy   . AKI (acute kidney injury) ([place])   . Dysarthria 09/29/2015  . Abnormal liver function 09/29/2015  . Acute renal failure (ARF) ([place]) 09/29/2015  . Hyponatremia 09/29/2015  . Small bowel obstruction ([place]) 05/17/2015  . Hypothyroidism 05/17/2015  . Uncontrolled hypertension 05/17/2015  . Hypocalcemia 05/17/2015  . Leukocytosis 05/17/2015  . Paroxysmal atrial flutter ([place]) 04/24/2015  . Incarcerated incisional hernia 03/28/2015  . SBO (small bowel obstruction) ([place]) 03/28/2015  . Nonischemic cardiomyopathy ([place]) 11/27/2012  . Biventricular ICD  11/27/2012  . Chronic diastolic heart failure ([place]) 11/27/2012  . Peripheral neuropathy secondary to chemotherapy 11/27/2012  . Hyperlipidemia 11/27/2012  . COPD (chronic obstructive pulmonary disease) ([place]) 12/03/2011  . DOE (dyspnea on exertion) 11/05/2011   PCP:  Carolee Rota, NP Pharmacy:   [id number] [place], [place] [address] Phone: [phone] Fax: [phone]  [facility] [place], [place] [address] [place] [address] Phone: [phone] Fax: [phone]  [id number] [place], [address] [place] [address] Phone: [phone] Fax: [phone]     Social Determinants of Health ([place]) Interventions    Readmission Risk Interventions Readmission Risk Prevention Plan 05/21/2020  Transportation Screening Complete  PCP or Specialist Appt within 3-5 Days Complete  HRI or [place] Complete  Social Work Consult for  [place] Planning/Counseling Complete  Medication Review Press photographer) Complete  Some recent data might be hidden

## 2020-05-21 NOTE — NC FL2 (Signed)
[place] LEVEL OF CARE SCREENING TOOL     IDENTIFICATION  Patient Name: [name] Birthdate: 1929-02-14 Sex: female Admission Date (Current Location): 05/16/2020  [facility] and [place] Number:  Herbalist and Address:  [facility],  [place], [place]      Provider Number: [id number]  Attending Physician Name and Address:  Tawni Millers  Relative Name and Phone Number:       Current Level of Care: Hospital Recommended Level of Care: [place] Prior Approval Number:    Date Approved/Denied:   PASRR Number: [id number]  Discharge Plan: SNF    Current Diagnoses: Patient Active Problem List   Diagnosis Date Noted  . Frequent falls 05/17/2020  . Macrocytosis 05/17/2020  . Chronic pain syndrome 05/17/2020  . Acute on chronic heart failure with preserved ejection fraction (HFpEF) ([place]) 02/11/2020  . Essential hypertension 02/11/2020  . Fracture of left ankle, closed, initial encounter 02/11/2020  . Pacemaker 02/11/2020  . Fall at home, initial encounter 02/11/2020  . Prolonged QT interval 02/11/2020  . ICD (implantable cardioverter-defibrillator) battery depletion   . Pacemaker battery depletion 09/09/2019  . Acute respiratory failure ([place]) 03/15/2019  . COPD exacerbation ([place]) 03/15/2019  . Acute respiratory failure with hypoxia ([place]) 03/14/2019  . COPD with acute exacerbation ([place]) 03/14/2019  . Digoxin toxicity 10/04/2017  . Closed fracture of distal end of radius 06/29/2017  . Acute encephalopathy   . AKI (acute kidney injury) ([place])   . Dysarthria 09/29/2015  . Abnormal liver function 09/29/2015  . Acute renal failure (ARF) ([place]) 09/29/2015  . Hyponatremia 09/29/2015  . Small bowel obstruction ([place]) 05/17/2015  . Hypothyroidism 05/17/2015  . Uncontrolled hypertension 05/17/2015  . Hypocalcemia 05/17/2015  . Leukocytosis 05/17/2015  . Paroxysmal atrial flutter ([place]) 04/24/2015   . Incarcerated incisional hernia 03/28/2015  . SBO (small bowel obstruction) ([place]) 03/28/2015  . Nonischemic cardiomyopathy ([place]) 11/27/2012  . Biventricular ICD  11/27/2012  . Chronic diastolic heart failure ([place]) 11/27/2012  . Peripheral neuropathy secondary to chemotherapy 11/27/2012  . Hyperlipidemia 11/27/2012  . COPD (chronic obstructive pulmonary disease) ([place]) 12/03/2011  . DOE (dyspnea on exertion) 11/05/2011    [place] RESPIRATION BLADDER Height & Weight     Self,Time,Situation,Place  Normal Continent Weight: 64.4 kg Height:  5\' 2"  (157.5 cm)  BEHAVIORAL SYMPTOMS/MOOD NEUROLOGICAL BOWEL NUTRITION STATUS      Continent Diet (Heart Healthy)  AMBULATORY STATUS COMMUNICATION OF NEEDS Skin   Extensive Assist Verbally Bruising,Skin abrasions                       Personal Care Assistance Level of Assistance  Bathing,Dressing Bathing Assistance: Limited assistance   Dressing Assistance: Limited assistance     Functional Limitations Info  Sight Sight Info: Impaired        SPECIAL CARE FACTORS FREQUENCY  PT (By licensed PT),OT (By licensed OT)     PT Frequency: 5 x weekly OT Frequency: 5 x weekly            Contractures Contractures Info: Not present    Additional Factors Info  Code Status,Allergies Code Status Info: DNR Allergies Info: None Known           Current Medications (05/21/2020):  This is the current hospital active medication list Current Facility-Administered Medications  Medication Dose Route Frequency Provider Last Rate Last Admin  . acetaminophen (TYLENOL) tablet 650 mg  650 mg Oral Q6H PRN Shela Leff, MD  Or  . acetaminophen (TYLENOL) suppository 650 mg  650 mg Rectal Q6H PRN Shela Leff, MD      . amLODipine (NORVASC) tablet 10 mg  10 mg Oral Daily Irene Pap N, DO   10 mg at 05/21/20 7616  . aspirin EC tablet 81 mg  81 mg Oral Daily Irene Pap N, DO   81 mg at 05/21/20 0737  . carvedilol (COREG)  tablet 12.5 mg  12.5 mg Oral BID WC Hall, Carole N, DO   12.5 mg at 05/21/20 0725  . Chlorhexidine Gluconate Cloth 2 % PADS 6 each  6 each Topical Daily Kayleen Memos, DO   6 each at 05/21/20 1062  . cholecalciferol (VITAMIN D) tablet 1,000 Units  1,000 Units Oral Daily Kayleen Memos, DO   1,000 Units at 05/21/20 6948  . dorzolamide-timolol (COSOPT) 22.3-6.8 MG/ML ophthalmic solution 1 drop  1 drop Both Eyes BID Irene Pap N, DO   1 drop at 05/21/20 5462  . DULoxetine (CYMBALTA) DR capsule 60 mg  60 mg Oral Daily Irene Pap N, DO   60 mg at 05/21/20 7035  . enoxaparin (LOVENOX) injection 40 mg  40 mg Subcutaneous Q24H Pham, Anh P, RPH   40 mg at 05/21/20 0725  . gabapentin (NEURONTIN) capsule 100-300 mg  100-300 mg Oral QHS PRN Irene Pap N, DO   100 mg at 05/20/20 2005  . latanoprost (XALATAN) 0.005 % ophthalmic solution 1 drop  1 drop Both Eyes QHS Hall, Davis N, DO   1 drop at 05/20/20 2005  . levothyroxine (SYNTHROID) tablet 112 mcg  112 mcg Oral QAC breakfast Kayleen Memos, DO   112 mcg at 05/21/20 0531  . mometasone-formoterol (DULERA) 100-5 MCG/ACT inhaler 2 puff  2 puff Inhalation BID Irene Pap N, DO   2 puff at 05/21/20 0747  . multivitamin with minerals tablet 1 tablet  1 tablet Oral Daily Kayleen Memos, DO   1 tablet at 05/21/20 0093  . oxyCODONE (Oxy IR/ROXICODONE) immediate release tablet 15 mg  15 mg Oral Q6H PRN Irene Pap N, DO   15 mg at 05/21/20 1026  . polyethylene glycol (MIRALAX / GLYCOLAX) packet 17 g  17 g Oral Daily PRN Irene Pap N, DO      . senna-docusate (Senokot-S) tablet 1 tablet  1 tablet Oral QHS Irene Pap N, DO      . traZODone (DESYREL) tablet 50 mg  50 mg Oral QHS PRN Irene Pap N, DO   50 mg at 05/19/20 2109  . vitamin B-12 (CYANOCOBALAMIN) tablet 1,000 mcg  1,000 mcg Oral Daily Irene Pap N, DO   1,000 mcg at 05/21/20 8182     Discharge Medications: Please see discharge summary for a list of discharge medications.  Relevant Imaging  Results:  Relevant Lab Results:   Additional Information [id number]  Leilany Digeronimo, Marjie Skiff, RN

## 2020-05-21 NOTE — Progress Notes (Signed)
Physical Therapy Treatment Patient Details Name: [name] MRN: [id number] DOB: March 03, 1929 Today's Date: 05/21/2020    History of Present Illness [name] is a 87 y.o. female with medical history significant of nonischemic cardiomyopathy status post BiV ICD, chronic diastolic heart failure, COPD, hypothyroidism, hypertension, hyperlipidemia, Dementia, hospital admission in November 2021 for ankle and calcaneal fractures secondary to a fall presenting to the ED via EMS for evaluation of 3 falls in the past 2 days.    PT Comments    Pt able to walk some in room today with flexed posture. She reports fear of falling, especially with turns. At this time, continue to recommend SNF.   Follow Up Recommendations  SNF     Equipment Recommendations  None recommended by PT    Recommendations for Other Services       Precautions / Restrictions Precautions Precautions: Fall    Mobility  Bed Mobility               General bed mobility comments: up in recliner upon arrival    Transfers Overall transfer level: Needs assistance Equipment used: Rolling walker (2 wheeled) Transfers: Sit to/from Stand Sit to Stand: Min assist;+2 physical assistance         General transfer comment: Took 2 attempts to get to standing from recliner and 1 from [facility], but required use of verbal and tactile cues for her proper hand position. some fear noted.  Ambulation/Gait Ambulation/Gait assistance: Min assist;+2 safety/equipment Gait Distance (Feet): 12 Feet Assistive device: Rolling walker (2 wheeled) Gait Pattern/deviations: Decreased step length - right;Decreased step length - left;Trunk flexed     General Gait Details: Trunk flexed with L forearm down on handle. cues to use hand and for upright posture.  Fearful at times of falling, more so noted during tones.   Stairs             Wheelchair Mobility    Modified Rankin (Stroke Patients Only)       Balance              Standing balance-Leahy Scale: Poor                              Cognition Arousal/Alertness: Awake/alert Behavior During Therapy: WFL for tasks assessed/performed Overall Cognitive Status: Within Functional Limits for tasks assessed                                        Exercises      General Comments        Pertinent Vitals/Pain Pain Assessment: No/denies pain    Home Living                      Prior Function            PT Goals (current goals can now be found in the care plan section) Acute Rehab PT Goals Patient Stated Goal: to go home PT Goal Formulation: With patient Time For Goal Achievement: 06/02/20 Potential to Achieve Goals: Good Progress towards PT goals: Progressing toward goals    Frequency    Min 3X/week      PT Plan Current plan remains appropriate    Co-evaluation              AM-PAC PT "6 Clicks" Mobility   Outcome Measure  Help needed turning from your back to your side while in a flat bed without using bedrails?: A Little Help needed moving from lying on your back to sitting on the side of a flat bed without using bedrails?: A Little Help needed moving to and from a bed to a chair (including a wheelchair)?: A Lot Help needed standing up from a chair using your arms (e.g., wheelchair or bedside chair)?: A Lot Help needed to walk in hospital room?: A Lot Help needed climbing 3-5 steps with a railing? : A Lot 6 Click Score: 14    End of Session Equipment Utilized During Treatment: Gait belt;Oxygen Activity Tolerance: Patient tolerated treatment well Patient left: in chair;with call bell/phone within reach;with chair alarm set Nurse Communication: Mobility status PT Visit Diagnosis: Unsteadiness on feet (R26.81);Repeated falls (R29.6);Muscle weakness (generalized) (M62.81)     Time: 4268-3419 PT Time Calculation (min) (ACUTE ONLY): 21 min  Charges:  $Gait Training: 8-22 mins                      Shain Pauwels L. Tamala Julian, [place] Pager [phone] 05/21/2020    Galen Manila 05/21/2020, 12:21 PM

## 2020-05-21 NOTE — Evaluation (Signed)
Occupational Therapy Evaluation Patient Details Name: [name] MRN: [id number] DOB: Jul 04, 1928 Today's Date: 05/21/2020    History of Present Illness [name] is a 87 y.o. female with medical history significant of nonischemic cardiomyopathy status post BiV ICD, chronic diastolic heart failure, COPD, hypothyroidism, hypertension, hyperlipidemia, Dementia, hospital admission in November 2021 for ankle and calcaneal fractures secondary to a fall presenting to the ED via EMS for evaluation of 3 falls in the past 2 days.   Clinical Impression   [name] is an 87 year old woman who presents with above medical history with generalized weakness, decreased activity tolerance and impaired balance. On evaluation patient supervision to transfer to side of bed, min assist to stand and transfer to [facility] and then take steps to the recliner needing steadying assistance and assistance to manage walker. Patient needing set up assistance and sitting position for most ADLs - needing min assist for toileting, LB bathing and mod assist for LB dressing. Patient's mobility limited to just several feet. Patient really wanting to go home at discharge reporting her granddaughter is home 24/7 but not always able to provide physical assistance. If patient improves functional mobility and self care abilities while in hospital may be able to go home at discharge but currently needs short term rehab at discharge. Patient agreeable to POC.    Follow Up Recommendations  SNF;Home health OT    Equipment Recommendations  3 in 1 bedside commode    Recommendations for Other Services       Precautions / Restrictions Precautions Precautions: Fall Precaution Comments: hx of falls Restrictions Weight Bearing Restrictions: No      Mobility Bed Mobility Overal bed mobility: Needs Assistance Bed Mobility: Supine to Sit     Supine to sit: HOB elevated;Min assist     General bed mobility comments: up in  recliner upon arrival    Transfers Overall transfer level: Needs assistance Equipment used: Rolling walker (2 wheeled) Transfers: Sit to/from [facility] Sit to Stand: Min assist Stand pivot transfers: Min assist       General transfer comment: Min assist for squat pivot to [facility]. Min assis to take steps to recliner after toileting - assistance for walker management.    Balance Overall balance assessment: Needs assistance Sitting-balance support: No upper extremity supported;Feet unsupported Sitting balance-Leahy Scale: Good     Standing balance support: During functional activity;Bilateral upper extremity supported Standing balance-Leahy Scale: Poor Standing balance comment: reliant on walker                           ADL either performed or assessed with clinical judgement   ADL Overall ADL's : Needs assistance/impaired Eating/Feeding: Independent   Grooming: Set up;Sitting   Upper Body Bathing: Set up;Sitting   Lower Body Bathing: Moderate assistance;Sit to/from stand Lower Body Bathing Details (indicate cue type and reason): mod assist for buttocks and back of legs Upper Body Dressing : Set up;Sitting   Lower Body Dressing: Moderate assistance;Sit to/from stand Lower Body Dressing Details (indicate cue type and reason): patient able to don clothing with figure 4 method but needs assistance for pulling clothing up as patient reliant on holding onto walker Toilet Transfer: Minimal assistance;Squat-pivot;BSC   Toileting- Clothing Manipulation and Hygiene: Sitting/lateral lean;Sit to/from stand;Moderate assistance Toileting - Clothing Manipulation Details (indicate cue type and reason): assistance for pericare after BM.             Vision Patient  Visual Report: No change from baseline       Perception     Praxis      Pertinent Vitals/Pain Pain Assessment: Faces Faces Pain Scale: Hurts a little bit Pain Location: low back Pain  Intervention(s): Monitored during session;Patient requesting pain meds-RN notified     Hand Dominance Right   Extremity/Trunk Assessment Upper Extremity Assessment Upper Extremity Assessment: Generalized weakness   Lower Extremity Assessment Lower Extremity Assessment: Defer to PT evaluation   Cervical / Trunk Assessment Cervical / Trunk Assessment: Kyphotic   Communication Communication Communication: No difficulties   Cognition Arousal/Alertness: Awake/alert Behavior During Therapy: WFL for tasks assessed/performed Overall Cognitive Status: Within Functional Limits for tasks assessed                                     General Comments       Exercises     Shoulder Instructions      Home Living Family/patient expects to be discharged to:: Private residence Living Arrangements: Children Available Help at Discharge: Family;Available PRN/intermittently Type of Home: House Home Access: Stairs to enter [employer] of Steps: 2 Entrance Stairs-Rails: Right;Left;Can reach both Home Layout: Two level;Able to live on main level with bedroom/bathroom     Bathroom Shower/Tub: Occupational psychologist: Standard     Home Equipment: Environmental consultant - 4 wheels;Shower seat;Grab bars - tub/shower;Grab bars - toilet;Cane - single point   Additional Comments: Pt reports lives with daughter and granddaughter (but granddaughter unable to assist).  Daughter works.      Prior Functioning/Environment Level of Independence: Independent with assistive device(s);Needs assistance  Gait / Transfers Assistance Needed: Reports could ambulate in home and short community distances with rollator or RW; several recent falls ADL's / Homemaking Assistance Needed: Reports able to do ADLs but with "great difficulty"and increased time; family assisted with IADLs   Comments: rollator; pt reports able to do ADLs but was difficult        OT Problem List: Decreased  strength;Decreased activity tolerance;Impaired balance (sitting and/or standing);Pain;Decreased knowledge of use of DME or AE      OT Treatment/Interventions: Self-care/ADL training;Therapeutic exercise;DME and/or AE instruction;Balance training;Patient/family education;Therapeutic activities    OT Goals(Current goals can be found in the care plan section) Acute Rehab OT Goals Patient Stated Goal: to go home OT Goal Formulation: With patient Time For Goal Achievement: 06/04/20 Potential to Achieve Goals: Good  OT Frequency: Min 2X/week   Barriers to D/C:            Co-evaluation              AM-PAC OT "6 Clicks" Daily Activity     Outcome Measure Help from another person eating meals?: None Help from another person taking care of personal grooming?: A Little Help from another person toileting, which includes using toliet, bedpan, or urinal?: A Little Help from another person bathing (including washing, rinsing, drying)?: A Little Help from another person to put on and taking off regular upper body clothing?: A Little Help from another person to put on and taking off regular lower body clothing?: A Lot 6 Click Score: 18   End of Session Equipment Utilized During Treatment: Rolling walker Nurse Communication: Mobility status  Activity Tolerance: Patient tolerated treatment well Patient left: in chair;with call bell/phone within reach;with chair alarm set  OT Visit Diagnosis: Unsteadiness on feet (R26.81);History of falling (Z91.81);Muscle weakness (generalized) (M62.81)  Time: 0037-0488 OT Time Calculation (min): 28 min Charges:  OT General Charges $OT Visit: 1 Visit OT Evaluation $OT Eval Moderate Complexity: 1 Mod OT Treatments $Self Care/Home Management : 8-22 mins  Nyshaun Standage, OTR/L Acute Care Rehab Services  Office [phone] Pager: [phone]   Lenward Chancellor 05/21/2020, 2:07 PM

## 2020-05-21 NOTE — Progress Notes (Signed)
PROGRESS NOTE    [name]  ION:[id number] DOB: Dec 03, 1928 DOA: 05/16/2020 PCP: Carolee Rota, NP    Brief Narrative:  [name] was admitted to the hospital with a working diagnosis of ambulatory dysfunction with frequent falls, in the setting of acute metabolic encephalopathy.  87 year old female past medical history for nonischemic cardiomyopathy status post BiV-ICD, chronic diastolic heart failure, COPD, hypothyroidism, hypertension, dyslipidemia and dementia.  She was brought to the hospital due to frequent falls, 3 episodes during the 2 days prior to hospitalization.  On her initial physical examination, she was disoriented, her blood pressure was 174/85, heart rate 87, respiratory rate 18, oxygen saturation 98%, she had dry mucous membranes, lungs clear to auscultation bilaterally, heart S1-S2, present rhythm, soft abdomen, 4+  bilateral lower extremity edema.   Sodium 140, potassium 4.7, chloride 97, bicarb 32, glucose 131, BUN 39, creatinine 1.29, white count 9.4, hemoglobin 12.5, hematocrit 40.2, platelets 151, B12 144. SARS COVID-19 negative.  Patient was placed on supportive medical therapy including intravenous fluids.  Patient very weak and deconditioned, recommendations to continue care at the skilled nursing facility.  Assessment & Plan:   Principal Problem:   Frequent falls Active Problems:   Acute encephalopathy   AKI (acute kidney injury) (HCC)   Macrocytosis   Chronic pain syndrome   1. Ambulatory dysfunction, acute metabolic encephalopathy. Patient with improved mentation but continue to be very weak and deconditioned.  PT/OT have recommended to SNF placement, to continue care.  B12 933   2. AKI. Renal function with serum cr down to 0,67 with K at 4,7 and bicarbonate at 33. Continue to avoid nephrotoxic medications or hypotension. Hold on IV fluids for now.   3. Hypothyroid. Continue with levothyroxine.   4. HTN. Continue blood pressure control with  amlodipine and carvedilol.   5. COPD. No signs of exacerbation.   6. Osteoarthritis with ambulatory dysfunction/ avascular necrosis right hip/ chronic pain syndrome/ constipation with impaction. At home uses a walker, continue to have ambulatory dysfunction and fall risk. Pain control with: duloxetine, gabapentin, oxycodone and trazodone,  Continue PT and OT,   Patient with disimpaction on 05/18/20.   Status is: Inpatient  Remains inpatient appropriate because:Unsafe d/c plan   Dispo:  Patient From: Home  Planned Disposition: [place]  Expected discharge date: 05/22/2020  Medically stable for discharge: Yes     DVT prophylaxis: Enoxaparin   Code Status:   DNR   Family Communication:  I spoke over the phone with the patient's daughter about patient's  condition, plan of care, prognosis and all questions were addressed.      Subjective: Patient continue to be very weak and deconditioned, no nausea or vomiting, no chest pain or dyspnea,   Objective: Vitals:   05/20/20 1945 05/20/20 2021 05/21/20 0528 05/21/20 0748  BP:  136/76 (!) 186/84   Pulse:  75 79   Resp:  14 14   Temp:  98.6 F (37 C) 98.3 F (36.8 C)   TempSrc:  Oral Oral   SpO2: 96% 95% 96% 97%  Weight:      Height:        Intake/Output Summary (Last 24 hours) at 05/21/2020 1331 Last data filed at 05/21/2020 0844 Gross per 24 hour  Intake 480 ml  Output 650 ml  Net -170 ml   Filed Weights   05/16/20 2136  Weight: 64.4 kg    Examination:   General: Not in pain or dyspnea, deconditioned  Neurology: Awake and alert,  non focal  E ENT: mild pallor, no icterus, oral mucosa moist Cardiovascular: No JVD. S1-S2 present, rhythmic, no gallops, rubs, or murmurs. Trace non pitting lower extremity edema. Pulmonary: positive breath sounds bilaterally, adequate air movement, no wheezing, rhonchi or rales. Gastrointestinal. Abdomen soft and non tender Skin. No rashes Musculoskeletal: no joint  deformities     Data Reviewed: I have personally reviewed following labs and imaging studies  CBC: Recent Labs  Lab 05/16/20 2344 05/20/20 0734  WBC 9.4 6.2  HGB 12.5 10.1*  HCT 40.2 32.8*  MCV 100.2* 101.9*  PLT 151 144*   Basic Metabolic Panel: Recent Labs  Lab 05/16/20 2344 05/18/20 0557 05/20/20 0734  NA 140 141 138  K 4.7 3.7 4.2  CL 97* 101 97*  CO2 32 32 33*  GLUCOSE 131* 134* 105*  BUN 39* 25* 15  CREATININE 1.29* 0.72 0.67  CALCIUM 8.9 7.9* 8.4*  MG  --   --  2.2  PHOS  --   --  2.6   GFR: Estimated Creatinine Clearance: 42 mL/min (by C-G formula based on SCr of 0.67 mg/dL). Liver Function Tests: Recent Labs  Lab 05/16/20 2344  AST 40  ALT 17  ALKPHOS 92  BILITOT 1.0  PROT 7.2  ALBUMIN 3.6   No results for input(s): LIPASE, AMYLASE in the last 168 hours. Recent Labs  Lab 05/17/20 0800  AMMONIA 10   Coagulation Profile: No results for input(s): INR, PROTIME in the last 168 hours. Cardiac Enzymes: No results for input(s): CKTOTAL, CKMB, CKMBINDEX, TROPONINI in the last 168 hours. BNP (last 3 results) No results for input(s): PROBNP in the last 8760 hours. HbA1C: No results for input(s): HGBA1C in the last 72 hours. CBG: No results for input(s): GLUCAP in the last 168 hours. Lipid Profile: No results for input(s): CHOL, HDL, LDLCALC, TRIG, CHOLHDL, LDLDIRECT in the last 72 hours. Thyroid Function Tests: Recent Labs    05/18/20 1544 05/20/20 0734  TSH 5.148*  --   FREET4  --  0.63   Anemia Panel: Recent Labs    05/21/20 0544  VITAMINB12 933*      Radiology Studies: I have reviewed all of the imaging during this hospital visit personally     Scheduled Meds: . amLODipine  10 mg Oral Daily  . aspirin EC  81 mg Oral Daily  . carvedilol  12.5 mg Oral BID WC  . Chlorhexidine Gluconate Cloth  6 each Topical Daily  . cholecalciferol  1,000 Units Oral Daily  . dorzolamide-timolol  1 drop Both Eyes BID  . DULoxetine  60 mg  Oral Daily  . enoxaparin (LOVENOX) injection  40 mg Subcutaneous Q24H  . latanoprost  1 drop Both Eyes QHS  . levothyroxine  112 mcg Oral QAC breakfast  . mometasone-formoterol  2 puff Inhalation BID  . multivitamin with minerals  1 tablet Oral Daily  . senna-docusate  1 tablet Oral QHS  . vitamin B-12  1,000 mcg Oral Daily   Continuous Infusions:   LOS: 4 days        Fouad Taul Gerome Apley, MD

## 2020-05-22 LAB — SARS CORONAVIRUS 2 (TAT 6-24 HRS): SARS Coronavirus 2: NEGATIVE

## 2020-05-22 NOTE — TOC Transition Note (Signed)
Transition of Care [facility]) - CM/SW Discharge Note   Patient Details  Name: [name] MRN: [id number] Date of Birth: 20-Nov-1928  Transition of Care [facility]) CM/SW Contact:  Lynnell Catalan, RN Phone Number: 05/22/2020, 3:04 PM   Clinical Narrative:    Spoke with pt at bedside and daughter [name] via phone. They have decided for pt to come back home at dc. [name] states that pt is active with [facility] for home health PT/OT and would like to resume services at dc. [facility] liaison contacted for referral. Arrie Aran also requested a hospital bed for home. [facility] contacted for hospital bed. Pt already has home 02. Will need ambulance transport home. [name] asks that [facility] be set up for 6:00pm. [facility] contacted and transport requested. RN made aware.   Final next level of care: Home w Home Health Services Barriers to Discharge: No Barriers Identified   Patient Goals and CMS Choice Patient states their goals for this hospitalization and ongoing recovery are:: To get home      Discharge Placement                Patient to be transferred to facility by: [facility] Name of family member notified: Daughter [name] Patient and family notified of of transfer: 05/22/20  Discharge Plan and Services   Discharge Planning Services: CM Consult            DME Arranged: Hospital bed DME Agency: [facility] Date DME Agency Contacted: 05/22/20 Time DME Agency Contacted: 49 Representative spoke with at DME Agency: Freda Munro HH Arranged: PT,OT,Nurse's Aide [place]: [place] Date [place]: 05/22/20 Time HH Agency Contacted: 1300 Representative spoke with at [place]: [place] ([place]) Interventions     Readmission Risk Interventions Readmission Risk Prevention Plan 05/21/2020  Transportation Screening Complete  PCP or Specialist Appt within 3-5 Days Complete  HRI or [place] Complete  Social Work Consult for [place] Planning/Counseling  Complete  Medication Review Press photographer) Complete  Some recent data might be hidden

## 2020-05-22 NOTE — Discharge Summary (Addendum)
Physician Discharge Summary  [name] DXA:[id number] DOB: [birth year] DOA: 05/16/2020  PCP: Carolee Rota, NP  Admit date: 05/16/2020 Discharge date: 05/22/2020  Admitted From: SNF Disposition:  Home   Recommendations for Outpatient Follow-up and new medication changes:  1. Follow up with Carolee Rota NP in 7 days.  2. Patient's daughter has decided to take patient home, with home health services.   I spoke over the phone with the patient's daughter about patient's  condition, plan of care, prognosis and all questions were addressed.  Home Health: yes   Equipment/Devices: hospital bed    Discharge Condition: stable  CODE STATUS: DNR   Diet recommendation: heart healthy   Brief/Interim Summary: [name] was admitted to the hospital with a working diagnosis of ambulatory dysfunction with frequent falls, in the setting of acute metabolic encephalopathy.  87 year old female past medical history for nonischemic cardiomyopathy status post BiV-ICD, chronic diastolic heart failure, COPD, hypothyroidism, hypertension, dyslipidemia and dementia.  She was brought to the hospital due to frequent falls, 3 episodes during the 2 days prior to hospitalization.  On her initial physical examination, she was disoriented different than her baseline, her blood pressure was 174/85, heart rate 87, respiratory rate 18, oxygen saturation 98%, she had dry mucous membranes, lungs clear to auscultation bilaterally, heart S1-S2, present rhythm, soft abdomen, 4+  bilateral lower extremity edema.   Sodium 140, potassium 4.7, chloride 97, bicarb 32, glucose 131, BUN 39, creatinine 1.29, white count 9.4, hemoglobin 12.5, hematocrit 40.2, platelets 151, B12 144. SARS COVID-19 negative.  Patient was placed on supportive medical therapy including intravenous fluids.  Patient very weak and deconditioned, recommendations to continue care at the skilled nursing facility. Her daughter decided to take patient home  with home health services.   1.  Ambulatory dysfunction, acute metabolic encephalopathy.  On admission her mentation was different than her baseline, with supportive medical therapy she had clinical improvement.  Her B12 was 933. Patient was seen by physical therapy/Occupational Therapy, recommendations to return to skilled nursing facility. Her daughter has decided to take her home with home health services.  2.  Acute kidney injury.  She received intravenous fluids with improvement of kidney function, she is tolerating p.o. diet adequately, no nausea or vomiting. At discharge sodium 138, potassium 4.2, chloride 97, bicarb 33, glucose 105, BUN 15, creatinine 0.67.  3.  Hypothyroidism.  Continue levothyroxine.  4.  Hypertension.  Continue blood pressure control with carvedilol and amlodipine.  5.  COPD.  No signs of acute exacerbation.  Continue with dulera and as needed albuterol.   6.  Osteoarthritis with amatory dysfunction, avascular necrosis right hip, chronic pain syndrome, constipation with impaction.  Patient has poor mobility, uses a walker for ambulation.   She underwent disimpaction 05/18/2020 with good toleration. Patient will continue duloxetine, gabapentin, oxycodone p.o. Continue PT/OT at home.  7. Diastolic heart failure. Tolerated well Iv fluids, at discharge resume furosemide, follow as outpatient.  To consider change as needed for volume overload.     Discharge Diagnoses:  Principal Problem:   Frequent falls Active Problems:   Acute encephalopathy   AKI (acute kidney injury) ([place])   Macrocytosis   Chronic pain syndrome    Discharge Instructions   Allergies as of 05/22/2020   No Known Allergies     Medication List    STOP taking these medications   polyethylene glycol 17 g packet Commonly known as: MIRALAX / GLYCOLAX     TAKE these medications   albuterol 108 (  90 Base) MCG/ACT inhaler Commonly known as: VENTOLIN HFA Inhale 1-2 puffs into the  lungs every 6 (six) hours as needed for wheezing or shortness of breath.   alendronate 70 MG tablet Commonly known as: FOSAMAX Take 70 mg by mouth every Sunday.   amLODipine 5 MG tablet Commonly known as: NORVASC Take 1 tablet (5 mg total) by mouth daily.   aspirin EC 81 MG tablet Take 1 tablet (81 mg total) by mouth daily. What changed: how much to take   carvedilol 12.5 MG tablet Commonly known as: COREG Take 1 tablet (12.5 mg total) by mouth 2 (two) times daily with a meal.   dorzolamide-timolol 22.3-6.8 MG/ML ophthalmic solution Commonly known as: COSOPT Place 1 drop into both eyes 2 (two) times daily.   Dulera 100-5 MCG/ACT Aero Generic drug: mometasone-formoterol Inhale 2 puffs into the lungs every 6 (six) hours as needed for wheezing or shortness of breath.   DULoxetine 60 MG capsule Commonly known as: CYMBALTA Take 60 mg by mouth daily.   furosemide 20 MG tablet Commonly known as: LASIX TAKE 2 TABLETS (40 MG TOTAL) BY MOUTH DAILY.   gabapentin 100 MG capsule Commonly known as: NEURONTIN Take 100-300 mg by mouth at bedtime as needed (pain).   latanoprost 0.005 % ophthalmic solution Commonly known as: XALATAN Place 1 drop into both eyes at bedtime.   liothyronine 25 MCG tablet Commonly known as: CYTOMEL Take 25 mcg by mouth daily.   multivitamin with minerals Tabs tablet Take 1 tablet by mouth daily.   oxyCODONE 15 MG immediate release tablet Commonly known as: ROXICODONE Take 1 tablet (15 mg total) by mouth every 6 (six) hours as needed for pain.   Synthroid 112 MCG tablet Generic drug: levothyroxine Take 112 mcg by mouth daily before breakfast.   traZODone 50 MG tablet Commonly known as: DESYREL Take 50 mg by mouth at bedtime as needed for sleep.   VITAMIN D3 PO Take 1 tablet by mouth daily.            Durable Medical Equipment  (From admission, onward)         Start     Ordered   05/22/20 1101  For home use only DME Hospital bed   Once       Question Answer Comment  Length of Need 6 Months   The above medical condition requires: Patient requires the ability to reposition frequently   Head must be elevated greater than: 45 degrees   Bed type Semi-electric      02 /24/22 1101          No Known Allergies     Procedures/Studies: DG Chest 2 View  Result Date: 05/16/2020 CLINICAL DATA:  Frequent falls EXAM: CHEST - 2 VIEW COMPARISON:  02/15/2020 FINDINGS: Frontal and lateral views of the chest demonstrate stable AICD. Cardiac silhouette is stable. Chronic prominence of the pulmonary arteries, without airspace disease, effusion, or pneumothorax. Chronic elevation of the left hemidiaphragm. No acute bony abnormalities. IMPRESSION: 1. Stable exam, no acute process. Electronically Signed   By: Randa Ngo M.D.   On: 05/16/2020 23:40   DG Ankle Complete Left  Result Date: 05/16/2020 CLINICAL DATA:  87 year old female with frequent fall. Left ankle pain. EXAM: LEFT ANKLE COMPLETE - 3+ VIEW COMPARISON:  Left foot radiograph dated 02/11/2020. FINDINGS: Evaluation is very limited due to severe osteopenia. No definite acute fracture. Old fracture of the distal fibula with bridging callus. Old fracture of the posterior malleolus. No dislocation. The ankle  mortise is intact. IMPRESSION: No definite acute fracture.  Severe osteopenia. Electronically Signed   By: Anner Crete M.D.   On: 05/16/2020 23:44   CT Head Wo Contrast  Result Date: 05/16/2020 CLINICAL DATA:  Increased falling.  Chronic neck pain. EXAM: CT HEAD WITHOUT CONTRAST CT CERVICAL SPINE WITHOUT CONTRAST TECHNIQUE: Multidetector CT imaging of the head and cervical spine was performed following the standard protocol without intravenous contrast. Multiplanar CT image reconstructions of the cervical spine were also generated. COMPARISON:  03/14/2019 FINDINGS: CT HEAD FINDINGS Brain: Age related atrophy. Chronic small-vessel ischemic changes of the cerebral  hemispheric white matter. Chronic arachnoid cyst at the anterior middle cranial fossa on right as seen previously. No sign acute infarction, mass lesion, hemorrhage, hydrocephalus or extra-axial collection. Vascular: There is atherosclerotic calcification of the major vessels at the base of the brain. Skull: Negative Sinuses/Orbits: Clear/normal Other: None CT CERVICAL SPINE FINDINGS Alignment: 3 mm degenerative anterolisthesis C3-4. 2 mm degenerative anterolisthesis C4-5 and C7-T1. Skull base and vertebrae: No evidence of recent fracture or worrisome focal bone lesion. Old appearing minor superior endplate depression at T1. Soft tissues and spinal canal: No significant soft tissue neck lesion seen. Atherosclerotic calcification at both carotid bifurcations. Scarring at the lung apices. Disc levels: Ordinary osteoarthritis at the C1-2 articulation but without encroachment upon the neural structures. C2-3: Facet osteoarthritis.  No significant stenosis. C3-4: Facet osteoarthritis right more than left. 3 mm of anterolisthesis. Right foraminal narrowing. C4-5: Facet osteoarthritis right more than left. 2 mm of anterolisthesis. Right foraminal narrowing. C5-6: Spondylosis with endplate osteophytes. Bilateral foraminal narrowing. C6-7: Small endplate osteophytes.  No significant stenosis. C7-T1: Facet arthritis with 2 mm of anterolisthesis.  No stenosis. Upper chest: Ordinary scarring in the upper lobes. Other: None IMPRESSION: HEAD CT: No acute or traumatic finding. Age related atrophy and chronic small-vessel ischemic changes. Chronic arachnoid cyst at the anterior middle cranial fossa on the right. No change since the prior exam. CERVICAL SPINE CT: No acute or traumatic finding. Chronic degenerative spondylosis and facet arthropathy as outlined above. Foraminal narrowing on the right at C3-4 and C4-5, and bilaterally at C5-6. Electronically Signed   By: Nelson Chimes M.D.   On: 05/16/2020 23:47   CT CERVICAL SPINE WO  CONTRAST  Result Date: 05/16/2020 CLINICAL DATA:  Increased falling.  Chronic neck pain. EXAM: CT HEAD WITHOUT CONTRAST CT CERVICAL SPINE WITHOUT CONTRAST TECHNIQUE: Multidetector CT imaging of the head and cervical spine was performed following the standard protocol without intravenous contrast. Multiplanar CT image reconstructions of the cervical spine were also generated. COMPARISON:  03/14/2019 FINDINGS: CT HEAD FINDINGS Brain: Age related atrophy. Chronic small-vessel ischemic changes of the cerebral hemispheric white matter. Chronic arachnoid cyst at the anterior middle cranial fossa on right as seen previously. No sign acute infarction, mass lesion, hemorrhage, hydrocephalus or extra-axial collection. Vascular: There is atherosclerotic calcification of the major vessels at the base of the brain. Skull: Negative Sinuses/Orbits: Clear/normal Other: None CT CERVICAL SPINE FINDINGS Alignment: 3 mm degenerative anterolisthesis C3-4. 2 mm degenerative anterolisthesis C4-5 and C7-T1. Skull base and vertebrae: No evidence of recent fracture or worrisome focal bone lesion. Old appearing minor superior endplate depression at T1. Soft tissues and spinal canal: No significant soft tissue neck lesion seen. Atherosclerotic calcification at both carotid bifurcations. Scarring at the lung apices. Disc levels: Ordinary osteoarthritis at the C1-2 articulation but without encroachment upon the neural structures. C2-3: Facet osteoarthritis.  No significant stenosis. C3-4: Facet osteoarthritis right more than left. 3 mm  of anterolisthesis. Right foraminal narrowing. C4-5: Facet osteoarthritis right more than left. 2 mm of anterolisthesis. Right foraminal narrowing. C5-6: Spondylosis with endplate osteophytes. Bilateral foraminal narrowing. C6-7: Small endplate osteophytes.  No significant stenosis. C7-T1: Facet arthritis with 2 mm of anterolisthesis.  No stenosis. Upper chest: Ordinary scarring in the upper lobes. Other: None  IMPRESSION: HEAD CT: No acute or traumatic finding. Age related atrophy and chronic small-vessel ischemic changes. Chronic arachnoid cyst at the anterior middle cranial fossa on the right. No change since the prior exam. CERVICAL SPINE CT: No acute or traumatic finding. Chronic degenerative spondylosis and facet arthropathy as outlined above. Foraminal narrowing on the right at C3-4 and C4-5, and bilaterally at C5-6. Electronically Signed   By: Nelson Chimes M.D.   On: 05/16/2020 23:47   DG Hand Complete Left  Result Date: 05/17/2020 CLINICAL DATA:  Status post fall. EXAM: LEFT HAND - COMPLETE 3+ VIEW COMPARISON:  01/03/2012 FINDINGS: Previous ORIF of the distal radius. The bones appear diffusely osteopenic. Moderate degenerative changes are noted involving the PIP joints and DIP joints. No fractures or dislocation identified. IMPRESSION: 1. No acute findings. 2. Osteopenia. 3. Moderate osteoarthritis involving the PIP and D IP joints. Electronically Signed   By: Kerby Moors M.D.   On: 05/17/2020 05:31   DG Foot Complete Right  Result Date: 05/16/2020 CLINICAL DATA:  Frequent falls, right foot pain EXAM: RIGHT FOOT COMPLETE - 3+ VIEW COMPARISON:  02/12/2020 FINDINGS: Frontal, oblique, lateral views of the right foot are obtained. Bones are severely osteopenic. Previous calcaneal avulsion fracture at the Achilles tendon insertion has healed in the interim. There are no acute displaced fractures. Prominent osteoarthritis at the first metatarsophalangeal joint and throughout the interphalangeal joints unchanged. There is severe soft tissue swelling of the right foot, greatest in the dorsal aspect of the forefoot. IMPRESSION: 1. Marked soft tissue swelling greatest in the dorsal aspect of the forefoot. 2. Severe osteopenia without acute fracture. 3. Prior healed calcaneal avulsion fracture. Electronically Signed   By: Randa Ngo M.D.   On: 05/16/2020 23:42   DG Hip Unilat W or Wo Pelvis 2-3 Views  Right  Result Date: 05/17/2020 CLINICAL DATA:  Right hip pain status post fall. EXAM: DG HIP (WITH OR WITHOUT PELVIS) 2-3V RIGHT COMPARISON:  None. FINDINGS: Exam detail is limited due to non anatomic position knee of the right hip. No definite fracture identified. Signs of advanced avascular necrosis with subchondral collapse and secondary osteoarthritis of the right hip is noted. Left hip appears located. Scoliosis and degenerative disc disease identified within the lumbar spine. IMPRESSION: 1. No definite acute findings. If there is high clinical suspicion for occult fracture or the patient refuses to weightbear, consider further evaluation with MRI. Although CT is expeditious, evidence is lacking regarding accuracy of CT over plain film radiography. 2. Signs of advanced avascular necrosis involving the right hip with subchondral collapse and secondary osteoarthritis. Electronically Signed   By: Kerby Moors M.D.   On: 05/17/2020 05:29        Subjective: Patient is feeling better, no nausea or vomiting, her mentation is back to her baseline, no chest pain or dyspnea.   Discharge Exam: Vitals:   05/22/20 0539 05/22/20 0731  BP: (!) 184/81   Pulse: 80   Resp: 14   Temp: 98 F (36.7 C)   SpO2: 97% 97%   Vitals:   05/21/20 1917 05/21/20 1944 05/22/20 0539 05/22/20 0731  BP: (!) 142/77  (!) 184/81   Pulse:  79  80   Resp: 16  14   Temp: 98.1 F (36.7 C)  98 F (36.7 C)   TempSrc: Oral  Oral   SpO2: 96% 98% 97% 97%  Weight:      Height:        General: Not in pain or dyspnea  Neurology: Awake and alert, non focal  E ENT: mild pallor, no icterus, oral mucosa moist Cardiovascular: No JVD. S1-S2 present, rhythmic, no gallops, rubs, or murmurs. Trace non pitting bilateral lower extremity edema. Pulmonary: positive breath sounds bilaterally Gastrointestinal. Abdomen soft and non tender Skin. No rashes Musculoskeletal: no joint deformities   The results of significant  diagnostics from this hospitalization (including imaging, microbiology, ancillary and laboratory) are listed below for reference.     Microbiology: Recent Results (from the past 240 hour(s))  Urine culture     Status: Abnormal   Collection Time: 05/17/20  4:00 AM   Specimen: Urine, Random  Result Value Ref [place] Status   Specimen Description   Final    URINE, RANDOM Performed at [facility] [address], [place], [place] [address]    Special Requests   Final    NONE Performed at [facility], [place] [address], [place], [place] [address]    Culture MULTIPLE SPECIES PRESENT, SUGGEST RECOLLECTION (A)  Final   Report Status 05/18/2020 FINAL  Final  Resp Panel by RT-PCR (Flu A&B, Covid) Nasopharyngeal Swab     Status: None   Collection Time: 05/17/20  7:16 AM   Specimen: Nasopharyngeal Swab; Nasopharyngeal(NP) swabs in vial transport medium  Result Value Ref Range Status   SARS Coronavirus 2 by RT PCR NEGATIVE NEGATIVE Final    Comment: (NOTE) SARS-CoV-2 target nucleic acids are NOT DETECTED.  The SARS-CoV-2 RNA is generally detectable in upper respiratory specimens during the acute phase of infection. The lowest concentration of SARS-CoV-2 viral copies this assay can detect is 138 copies/mL. A negative result does not preclude SARS-Cov-2 infection and should not be used as the sole basis for treatment or other patient management decisions. A negative result may occur with  improper specimen collection/handling, submission of specimen other than nasopharyngeal swab, presence of viral mutation(s) within the areas targeted by this assay, and inadequate number of viral copies(<138 copies/mL). A negative result must be combined with clinical observations, patient history, and epidemiological information. The expected result is Negative.  Fact Sheet for Patients:  EntrepreneurPulse.com.au  Fact Sheet for Healthcare Providers:   IncredibleEmployment.be  This test is no t yet approved or cleared by the [place] FDA and  has been authorized for detection and/or diagnosis of SARS-CoV-2 by FDA under an Emergency Use Authorization (EUA). This EUA will remain  in effect (meaning this test can be used) for the duration of the COVID-19 declaration under Section 564(b)(1) of the Act, 21 U.S.C.section 360bbb-3(b)(1), unless the authorization is terminated  or revoked sooner.       Influenza A by PCR NEGATIVE NEGATIVE Final   Influenza B by PCR NEGATIVE NEGATIVE Final    Comment: (NOTE) The Xpert Xpress SARS-CoV-2/FLU/RSV plus assay is intended as an aid in the diagnosis of influenza from Nasopharyngeal swab specimens and should not be used as a sole basis for treatment. Nasal washings and aspirates are unacceptable for Xpert Xpress SARS-CoV-2/FLU/RSV testing.  Fact Sheet for Patients: EntrepreneurPulse.com.au  Fact Sheet for Healthcare Providers: IncredibleEmployment.be  This test is not yet approved or cleared by the [place] FDA and has been  authorized for detection and/or diagnosis of SARS-CoV-2 by FDA under an Emergency Use Authorization (EUA). This EUA will remain in effect (meaning this test can be used) for the duration of the COVID-19 declaration under Section 564(b)(1) of the Act, 21 U.S.C. section 360bbb-3(b)(1), unless the authorization is terminated or revoked.  Performed at [facility], [place] [address] [place], [place] [address]   SARS CORONAVIRUS 2 (TAT 6-24 HRS) Nasopharyngeal Nasopharyngeal Swab     Status: None   Collection Time: 05/21/20  2:10 PM   Specimen: Nasopharyngeal Swab  Result Value Ref [place] Status   SARS Coronavirus 2 NEGATIVE NEGATIVE Final    Comment: (NOTE) SARS-CoV-2 target nucleic acids are NOT DETECTED.  The SARS-CoV-2 RNA is generally detectable in upper and lower respiratory specimens  during the acute phase of infection. Negative results do not preclude SARS-CoV-2 infection, do not rule out co-infections with other pathogens, and should not be used as the sole basis for treatment or other patient management decisions. Negative results must be combined with clinical observations, patient history, and epidemiological information. The expected result is Negative.  Fact Sheet for Patients: SugarRoll.be  Fact Sheet for Healthcare Providers: https://www.woods-mathews.com/  This test is not yet approved or cleared by the [place] FDA and  has been authorized for detection and/or diagnosis of SARS-CoV-2 by FDA under an Emergency Use Authorization (EUA). This EUA will remain  in effect (meaning this test can be used) for the duration of the COVID-19 declaration under Se ction 564(b)(1) of the Act, 21 U.S.C. section 360bbb-3(b)(1), unless the authorization is terminated or revoked sooner.  Performed at [facility], [place] [address], [place], [place] [address]      Labs: BNP (last 3 results) Recent Labs    02/11/20 1715 05/16/20 2344  BNP 226.8* 31.5   Basic Metabolic Panel: Recent Labs  Lab 05/16/20 2344 05/18/20 0557 05/20/20 0734  NA 140 141 138  K 4.7 3.7 4.2  CL 97* 101 97*  CO2 32 32 33*  GLUCOSE 131* 134* 105*  BUN 39* 25* 15  CREATININE 1.29* 0.72 0.67  CALCIUM 8.9 7.9* 8.4*  MG  --   --  2.2  PHOS  --   --  2.6   Liver Function Tests: Recent Labs  Lab 05/16/20 2344  AST 40  ALT 17  ALKPHOS 92  BILITOT 1.0  PROT 7.2  ALBUMIN 3.6   No results for input(s): LIPASE, AMYLASE in the last 168 hours. Recent Labs  Lab 05/17/20 0800  AMMONIA 10   CBC: Recent Labs  Lab 05/16/20 2344 05/20/20 0734  WBC 9.4 6.2  HGB 12.5 10.1*  HCT 40.2 32.8*  MCV 100.2* 101.9*  PLT 151 145*   Cardiac Enzymes: No results for input(s): CKTOTAL, CKMB, CKMBINDEX, TROPONINI in the last 168  hours. BNP: Invalid input(s): POCBNP CBG: No results for input(s): GLUCAP in the last 168 hours. D-Dimer No results for input(s): DDIMER in the last 72 hours. Hgb A1c No results for input(s): HGBA1C in the last 72 hours. Lipid Profile No results for input(s): CHOL, HDL, LDLCALC, TRIG, CHOLHDL, LDLDIRECT in the last 72 hours. Thyroid function studies No results for input(s): TSH, T4TOTAL, T3FREE, THYROIDAB in the last 72 hours.  Invalid input(s): FREET3 Anemia work up Recent Labs    05/21/20 0544  VITAMINB12 933*   Urinalysis    Component Value Date/Time   COLORURINE YELLOW 05/17/2020 0400   APPEARANCEUR CLEAR 05/17/2020 0400   LABSPEC 1.014 05/17/2020 0400  PHURINE 5.0 05/17/2020 0400   GLUCOSEU NEGATIVE 05/17/2020 0400   HGBUR NEGATIVE 05/17/2020 0400   BILIRUBINUR NEGATIVE 05/17/2020 0400   KETONESUR 5 (A) 05/17/2020 0400   PROTEINUR NEGATIVE 05/17/2020 0400   NITRITE NEGATIVE 05/17/2020 0400   LEUKOCYTESUR TRACE (A) 05/17/2020 0400   Sepsis Labs Invalid input(s): PROCALCITONIN,  WBC,  LACTICIDVEN Microbiology Recent Results (from the past 240 hour(s))  Urine culture     Status: Abnormal   Collection Time: 05/17/20  4:00 AM   Specimen: Urine, Random  Result Value Ref [place] Status   Specimen Description   Final    URINE, RANDOM Performed at [facility], [place] [address], [place], [place] [address]    Special Requests   Final    NONE Performed at [facility], [place] [address], [place], [place] [address]    Culture MULTIPLE SPECIES PRESENT, SUGGEST RECOLLECTION (A)  Final   Report Status 05/18/2020 FINAL  Final  Resp Panel by RT-PCR (Flu A&B, Covid) Nasopharyngeal Swab     Status: None   Collection Time: 05/17/20  7:16 AM   Specimen: Nasopharyngeal Swab; Nasopharyngeal(NP) swabs in vial transport medium  Result Value Ref [place] Status   SARS Coronavirus 2 by RT PCR NEGATIVE NEGATIVE Final    Comment: (NOTE) SARS-CoV-2  target nucleic acids are NOT DETECTED.  The SARS-CoV-2 RNA is generally detectable in upper respiratory specimens during the acute phase of infection. The lowest concentration of SARS-CoV-2 viral copies this assay can detect is 138 copies/mL. A negative result does not preclude SARS-Cov-2 infection and should not be used as the sole basis for treatment or other patient management decisions. A negative result may occur with  improper specimen collection/handling, submission of specimen other than nasopharyngeal swab, presence of viral mutation(s) within the areas targeted by this assay, and inadequate number of viral copies(<138 copies/mL). A negative result must be combined with clinical observations, patient history, and epidemiological information. The expected result is Negative.  Fact Sheet for Patients:  EntrepreneurPulse.com.au  Fact Sheet for Healthcare Providers:  IncredibleEmployment.be  This test is no t yet approved or cleared by the [place] FDA and  has been authorized for detection and/or diagnosis of SARS-CoV-2 by FDA under an Emergency Use Authorization (EUA). This EUA will remain  in effect (meaning this test can be used) for the duration of the COVID-19 declaration under Section 564(b)(1) of the Act, 21 U.S.C.section 360bbb-3(b)(1), unless the authorization is terminated  or revoked sooner.       Influenza A by PCR NEGATIVE NEGATIVE Final   Influenza B by PCR NEGATIVE NEGATIVE Final    Comment: (NOTE) The Xpert Xpress SARS-CoV-2/FLU/RSV plus assay is intended as an aid in the diagnosis of influenza from Nasopharyngeal swab specimens and should not be used as a sole basis for treatment. Nasal washings and aspirates are unacceptable for Xpert Xpress SARS-CoV-2/FLU/RSV testing.  Fact Sheet for Patients: EntrepreneurPulse.com.au  Fact Sheet for Healthcare  Providers: IncredibleEmployment.be  This test is not yet approved or cleared by the [place] FDA and has been authorized for detection and/or diagnosis of SARS-CoV-2 by FDA under an Emergency Use Authorization (EUA). This EUA will remain in effect (meaning this test can be used) for the duration of the COVID-19 declaration under Section 564(b)(1) of the Act, 21 U.S.C. section 360bbb-3(b)(1), unless the authorization is terminated or revoked.  Performed at [facility], [place] [address], [place], [place] [address]   SARS CORONAVIRUS 2 (TAT 6-24 HRS) Nasopharyngeal  Nasopharyngeal Swab     Status: None   Collection Time: 05/21/20  2:10 PM   Specimen: Nasopharyngeal Swab  Result Value Ref Range Status   SARS Coronavirus 2 NEGATIVE NEGATIVE Final    Comment: (NOTE) SARS-CoV-2 target nucleic acids are NOT DETECTED.  The SARS-CoV-2 RNA is generally detectable in upper and lower respiratory specimens during the acute phase of infection. Negative results do not preclude SARS-CoV-2 infection, do not rule out co-infections with other pathogens, and should not be used as the sole basis for treatment or other patient management decisions. Negative results must be combined with clinical observations, patient history, and epidemiological information. The expected result is Negative.  Fact Sheet for Patients: SugarRoll.be  Fact Sheet for Healthcare Providers: https://www.woods-mathews.com/  This test is not yet approved or cleared by the [place] FDA and  has been authorized for detection and/or diagnosis of SARS-CoV-2 by FDA under an Emergency Use Authorization (EUA). This EUA will remain  in effect (meaning this test can be used) for the duration of the COVID-19 declaration under Se ction 564(b)(1) of the Act, 21 U.S.C. section 360bbb-3(b)(1), unless the authorization is terminated or revoked  sooner.  Performed at [facility], [place] [address], [place], [place] [address]      Time coordinating discharge: 45 minutes  SIGNED:   Tawni Millers, MD  [facility] 05/22/2020, 11:27 AM

## 2020-05-22 NOTE — Progress Notes (Signed)
    Durable Medical Equipment  (From admission, onward)         Start     Ordered   05/22/20 1101  For home use only DME Hospital bed  Once       Question Answer Comment  Length of Need 6 Months   The above medical condition requires: Patient requires the ability to reposition frequently   Head must be elevated greater than: 45 degrees   Bed type Semi-electric      05/22/20 1101

## 2020-05-23 NOTE — Progress Notes (Signed)
Pt was discharged home via EMS at [place] .

## 2020-05-23 NOTE — Plan of Care (Signed)
  Problem: Pain Managment: Goal: General experience of comfort will improve Outcome: Completed/Met   Problem: Safety: Goal: Ability to remain free from injury will improve Outcome: Completed/Met   Problem: Skin Integrity: Goal: Risk for impaired skin integrity will decrease Outcome: Completed/Met   Problem: Pain Managment: Goal: General experience of comfort will improve Outcome: Completed/Met   Problem: Safety: Goal: Ability to remain free from injury will improve Outcome: Completed/Met   Problem: Skin Integrity: Goal: Risk for impaired skin integrity will decrease Outcome: Completed/Met

## 2020-05-26 DIAGNOSIS — G894 Chronic pain syndrome: Secondary | ICD-10-CM | POA: Diagnosis not present

## 2020-05-26 DIAGNOSIS — J45909 Unspecified asthma, uncomplicated: Secondary | ICD-10-CM | POA: Diagnosis not present

## 2020-06-05 DIAGNOSIS — E039 Hypothyroidism, unspecified: Secondary | ICD-10-CM | POA: Diagnosis not present

## 2020-06-05 DIAGNOSIS — I5033 Acute on chronic diastolic (congestive) heart failure: Secondary | ICD-10-CM | POA: Diagnosis not present

## 2020-06-05 DIAGNOSIS — F32A Depression, unspecified: Secondary | ICD-10-CM | POA: Diagnosis not present

## 2020-06-05 DIAGNOSIS — G629 Polyneuropathy, unspecified: Secondary | ICD-10-CM | POA: Diagnosis not present

## 2020-06-05 DIAGNOSIS — Z7982 Long term (current) use of aspirin: Secondary | ICD-10-CM | POA: Diagnosis not present

## 2020-06-05 DIAGNOSIS — Z95810 Presence of automatic (implantable) cardiac defibrillator: Secondary | ICD-10-CM | POA: Diagnosis not present

## 2020-06-05 DIAGNOSIS — G9341 Metabolic encephalopathy: Secondary | ICD-10-CM | POA: Diagnosis not present

## 2020-06-05 DIAGNOSIS — M80072D Age-related osteoporosis with current pathological fracture, left ankle and foot, subsequent encounter for fracture with routine healing: Secondary | ICD-10-CM | POA: Diagnosis not present

## 2020-06-05 DIAGNOSIS — G253 Myoclonus: Secondary | ICD-10-CM | POA: Diagnosis not present

## 2020-06-05 DIAGNOSIS — I110 Hypertensive heart disease with heart failure: Secondary | ICD-10-CM | POA: Diagnosis not present

## 2020-06-05 DIAGNOSIS — Z9181 History of falling: Secondary | ICD-10-CM | POA: Diagnosis not present

## 2020-06-05 DIAGNOSIS — J9621 Acute and chronic respiratory failure with hypoxia: Secondary | ICD-10-CM | POA: Diagnosis not present

## 2020-06-05 DIAGNOSIS — Z8543 Personal history of malignant neoplasm of ovary: Secondary | ICD-10-CM | POA: Diagnosis not present

## 2020-06-05 DIAGNOSIS — J189 Pneumonia, unspecified organism: Secondary | ICD-10-CM | POA: Diagnosis not present

## 2020-06-05 DIAGNOSIS — F0280 Dementia in other diseases classified elsewhere without behavioral disturbance: Secondary | ICD-10-CM | POA: Diagnosis not present

## 2020-06-05 DIAGNOSIS — J440 Chronic obstructive pulmonary disease with acute lower respiratory infection: Secondary | ICD-10-CM | POA: Diagnosis not present

## 2020-06-05 DIAGNOSIS — N179 Acute kidney failure, unspecified: Secondary | ICD-10-CM | POA: Diagnosis not present

## 2020-06-05 DIAGNOSIS — D649 Anemia, unspecified: Secondary | ICD-10-CM | POA: Diagnosis not present

## 2020-06-05 DIAGNOSIS — M80071D Age-related osteoporosis with current pathological fracture, right ankle and foot, subsequent encounter for fracture with routine healing: Secondary | ICD-10-CM | POA: Diagnosis not present

## 2020-06-18 DIAGNOSIS — S90229A Contusion of unspecified lesser toe(s) with damage to nail, initial encounter: Secondary | ICD-10-CM | POA: Diagnosis not present

## 2020-07-01 DIAGNOSIS — G894 Chronic pain syndrome: Secondary | ICD-10-CM | POA: Diagnosis not present

## 2020-07-01 DIAGNOSIS — L609 Nail disorder, unspecified: Secondary | ICD-10-CM | POA: Diagnosis not present

## 2020-07-01 DIAGNOSIS — F329 Major depressive disorder, single episode, unspecified: Secondary | ICD-10-CM | POA: Diagnosis not present

## 2020-07-01 DIAGNOSIS — I10 Essential (primary) hypertension: Secondary | ICD-10-CM | POA: Diagnosis not present

## 2020-08-04 ENCOUNTER — Telehealth: Payer: Self-pay | Admitting: *Deleted

## 2020-08-04 ENCOUNTER — Telehealth: Payer: Self-pay

## 2020-08-04 ENCOUNTER — Encounter: Payer: Self-pay | Admitting: Cardiovascular Disease

## 2020-08-04 ENCOUNTER — Telehealth (INDEPENDENT_AMBULATORY_CARE_PROVIDER_SITE_OTHER): Payer: Medicare Other | Admitting: Cardiovascular Disease

## 2020-08-04 VITALS — BP 124/71 | HR 86 | Ht 62.0 in | Wt 140.0 lb

## 2020-08-04 DIAGNOSIS — J9611 Chronic respiratory failure with hypoxia: Secondary | ICD-10-CM | POA: Diagnosis not present

## 2020-08-04 DIAGNOSIS — Z950 Presence of cardiac pacemaker: Secondary | ICD-10-CM | POA: Diagnosis not present

## 2020-08-04 DIAGNOSIS — I10 Essential (primary) hypertension: Secondary | ICD-10-CM

## 2020-08-04 DIAGNOSIS — I5032 Chronic diastolic (congestive) heart failure: Secondary | ICD-10-CM | POA: Diagnosis not present

## 2020-08-04 DIAGNOSIS — J449 Chronic obstructive pulmonary disease, unspecified: Secondary | ICD-10-CM

## 2020-08-04 DIAGNOSIS — I428 Other cardiomyopathies: Secondary | ICD-10-CM

## 2020-08-04 DIAGNOSIS — E039 Hypothyroidism, unspecified: Secondary | ICD-10-CM | POA: Diagnosis not present

## 2020-08-04 DIAGNOSIS — I4892 Unspecified atrial flutter: Secondary | ICD-10-CM

## 2020-08-04 MED ORDER — BISOPROLOL FUMARATE 10 MG PO TABS: 10.0000 mg | ORAL_TABLET | Freq: Every day | ORAL | 11 refills | Status: DC

## 2020-08-04 MED ORDER — FUROSEMIDE 20 MG PO TABS: ORAL_TABLET | ORAL | 11 refills | Status: DC

## 2020-08-04 NOTE — Progress Notes (Signed)
Patient ID: [name], female   DOB: Jun 08, 1928, 87 y.o.   MRN: [id number]    Cardiology TELEMEDICINE Note       Virtual Visit via Video Note   This visit type was conducted due to national recommendations for restrictions regarding the COVID-19 Pandemic (e.g. social distancing) in an effort to limit this patient's exposure and mitigate transmission in our community.  Due to her co-morbid illnesses, this patient is at least at moderate risk for complications without adequate follow up.  This format is felt to be most appropriate for this patient at this time.  All issues noted in this document were discussed and addressed.  A limited physical exam was performed with this format.  Please refer to the patient's chart for her consent to telehealth for [facility].       Date:  08/05/2020   ID:  [name], DOB 28-Dec-1928, MRN [id number] The patient was identified using 2 identifiers.  Patient Location: Home Provider Location: Office/Clinic   PCP:  Carolee Rota, NP   [facility] Providers Cardiologist:  Virl Axe, MD Electrophysiologist:  Sanda Klein, MD      Date:  08/05/2020   ID:  [name], DOB [birth year], MRN [id number]  PCP:  Carolee Rota, NP  Cardiologist:  Jolyn Nap, MD;  Sanda Klein, MD   Chief Complaint  Patient presents with  . Congestive Heart Failure    History of Present Illness:  CAROLLE ISHII is a 87 y.o. female with CHF due to nonischemic cardiomyopathy, CRT hyperresponder here for CHF follow-up.  Had repeated falls.  Was hospitalized in November 2021 with a left ankle and right calcaneal fractures.  She had a visit to the emergency room and she was hospitalized for a week in February with metabolic encephalopathy and severe constipation requiring disimpaction.  She did not have any problems with heart failure during that admission and and in fact improved with IV fluids.  She has had an extremely slow recovery.  He has severe  avascular necrosis of the right hip with subchondral collapse and secondary osteoarthritis.  She uses a motorized wheelchair around the house.  She is still getting some physical and occupational therapy.  She is on oxygen at 2 L most of the day.  She uses Symbicort but has not required any rescue inhalers.  She is short of breath with light activity.  She is taking furosemide and has not had any problems with edema.  She also denies orthopnea and PND, has not had palpitations, dizziness or syncope.  She does not complain of angina, new focal neurological events or claudication.  No new falls.  She has not been able to weigh herself.  We have not received any downloads from her device since last summer, when she underwent a generator change out.  It appears that her new transmitter has not been appropriately logged in.  We will try to solve that problem today.  The transmitter serial number is [id number] (model [id number]).  Her initial CRT-D-D device was implanted in 2009, generator change out in 2015, change out again in 2021 with a downgrade to a CRT-P Medtronic Percepta device.  Her most recent echocardiogram performed in November 2021, roughly 4 months after her last device change out, showed unremarkable LVEF of 70-75%.  There was mild elevation in systolic PAP at 42 mmHg.  The left atrium was severely dilated.  There was a report of mild mitral stenosis due to mitral and calcification (mean gradient  5 mmHg at 73 bpm).  Reported left ventricular ejection fraction as low as 20% in the past. She had normal coronary arteries by catheterization in 1992. After implementation of resynchronization pacemaker therapy in 2009 her ejection fraction has returned to normal.  Last echo June 2017 showed EF 65-70%.  She had a Nature conservation officer in May 2015 (Medtronic Viva XT).  She then had a generator change I would downgrade to CRT-P in July 2021 with a Medtronic Percepta device she is not pacemaker dependent. She has  COPD with chronic dyspnea, hyperlipidemia and a history of neuropathy secondary to chemotherapy.  Past Medical History:  Diagnosis Date  . AICD (automatic cardioverter/defibrillator) present 06/27/2007   medtronic  . ARF (acute renal failure) ([place]) 09/2017  . Cancer ([place])    ovarian--stage 4  . Cardiomyopathy ([place])   . CHF (congestive heart failure) ([place])   . COPD (chronic obstructive pulmonary disease) ([place])   . Dyspnea   . Hypothyroidism   . Peripheral neuropathy   . Systemic hypertension   . Thyroid disease     Past Surgical History:  Procedure Laterality Date  . ABDOMINAL HYSTERECTOMY  oct 2000   along with ovarian ca surg  . APPENDECTOMY    . BIV PACEMAKER GENERATOR CHANGEOUT N/A 09/10/2019   Procedure: BIV PACEMAKER GENERATOR CHANGEOUT;  Surgeon: Sanda Klein, MD;  Location: [facility];  Service: Cardiovascular;  Laterality: N/A;  . CARDIAC CATHETERIZATION  09/27/1990   normal coronaries,dilated cardiomyopathy  . CARDIAC DEFIBRILLATOR PLACEMENT  06/27/2007   medtronic concerto  . CHOLECYSTECTOMY    . IMPLANTABLE CARDIOVERTER DEFIBRILLATOR GENERATOR CHANGE N/A 07/31/2013   Procedure: IMPLANTABLE CARDIOVERTER DEFIBRILLATOR GENERATOR CHANGE;  Surgeon: Sanda Klein, MD;  Location: [facility];  Service: Cardiovascular;  Laterality: N/A;  . NM MYOCAR PERF WALL MOTION  06/05/2007   no ischemia    Outpatient Medications Prior to Visit  Medication Sig Dispense Refill  . alendronate (FOSAMAX) 70 MG tablet Take 70 mg by mouth every Sunday.     . budesonide-formoterol (SYMBICORT) 160-4.5 MCG/ACT inhaler Inhale into the lungs.    . Cholecalciferol (VITAMIN D3 PO) Take 1 tablet by mouth daily.    . dorzolamide-timolol (COSOPT) 22.3-6.8 MG/ML ophthalmic solution Place 1 drop into both eyes 2 (two) times daily. 10 mL 12  . DULERA 100-5 MCG/ACT AERO Inhale 2 puffs into the lungs every 6 (six) hours as needed for wheezing or shortness of breath.     . DULoxetine (CYMBALTA) 60 MG  capsule Take 60 mg by mouth daily.   2  . gabapentin (NEURONTIN) 100 MG capsule Take 100-300 mg by mouth at bedtime as needed (pain).     Marland Kitchen latanoprost (XALATAN) 0.005 % ophthalmic solution Place 1 drop into both eyes at bedtime. 2.5 mL 12  . liothyronine (CYTOMEL) 25 MCG tablet Take 25 mcg by mouth daily.    . Multiple Vitamin (MULTIVITAMIN WITH MINERALS) TABS tablet Take 1 tablet by mouth daily.    Marland Kitchen oxyCODONE (ROXICODONE) 15 MG immediate release tablet Take 1 tablet (15 mg total) by mouth every 6 (six) hours as needed for pain. 15 tablet 0  . SYNTHROID 112 MCG tablet Take 112 mcg by mouth daily before breakfast.     . traZODone (DESYREL) 50 MG tablet Take 50 mg by mouth at bedtime as needed for sleep.     . carvedilol (COREG) 12.5 MG tablet Take 1 tablet (12.5 mg total) by mouth 2 (two) times daily with a meal. 180 tablet 3  .  furosemide (LASIX) 20 MG tablet TAKE 2 TABLETS (40 MG TOTAL) BY MOUTH DAILY. 180 tablet 1  . aspirin EC 81 MG tablet Take 1 tablet (81 mg total) by mouth daily. (Patient not taking: Reported on 08/04/2020) 90 tablet 3  . albuterol (VENTOLIN HFA) 108 (90 Base) MCG/ACT inhaler Inhale 1-2 puffs into the lungs every 6 (six) hours as needed for wheezing or shortness of breath.    Marland Kitchen amLODipine (NORVASC) 5 MG tablet Take 1 tablet (5 mg total) by mouth daily. (Patient not taking: Reported on 08/04/2020) 90 tablet 2   No facility-administered medications prior to visit.     Allergies:   Patient has no known allergies.   Social History   Socioeconomic History  . Marital status: Widowed    Spouse name: Not on file  . Number of children: 2  . Years of education: Not on file  . Highest education level: Not on file  Occupational History  . Occupation: housewife  Tobacco Use  . Smoking status: Never Smoker  . Smokeless tobacco: Never Used  Vaping Use  . Vaping Use: Never used  Substance and Sexual Activity  . Alcohol use: No  . Drug use: No  . Sexual activity: Not on file   Other Topics Concern  . Not on file  Social History Narrative  . Not on file   Social Determinants of Health   Financial Resource Strain: Not on file  Food Insecurity: Not on file  Transportation Needs: Not on file  Physical Activity: Not on file  Stress: Not on file  Social Connections: Not on file     Family History:  The patient's family history includes Heart attack in her father; Heart disease in her paternal grandmother; Lung cancer in her father and mother.   ROS:   Please see the history of present illness.    ROS All other systems reviewed and are negative.   PHYSICAL EXAM:   VS:  BP 124/71   Pulse 86   Ht 5\' 2"  (1.575 m)   Wt 140 lb (63.5 kg)   BMI 25.61 kg/m      Unable to examine except visually.  No obvious evidence of fluid overload by video exam (no jugular venous distention, no edema), appears to be breathing comfortably wearing an oxygen cannula.  Does not appear particularly pale or jaundiced.  She is not tachypneic.  Her voice is strong.  Wt Readings from Last 3 Encounters:  08/04/20 140 lb (63.5 kg)  05/16/20 142 lb (64.4 kg)  02/18/20 142 lb 3.2 oz (64.5 kg)      Studies/Labs Reviewed:   EKG: The most recent ECG tracing from 05/16/2020 shows atrial sensed, biventricular paced rhythm with a unipolar spike, small positive R wave in V1 and relatively narrow QRS at 142 ms.  QTc 505 ms.  Recent Labs: BMET    Component Value Date/Time   NA 138 05/20/2020 0734   NA 137 01/15/2019 1350   K 4.2 05/20/2020 0734   CL 97 (L) 05/20/2020 0734   CO2 33 (H) 05/20/2020 0734   GLUCOSE 105 (H) 05/20/2020 0734   BUN 15 05/20/2020 0734   BUN 22 01/15/2019 1350   CREATININE 0.67 05/20/2020 0734   CREATININE 0.82 07/26/2013 1238   CALCIUM 8.4 (L) 05/20/2020 0734   GFRNONAA >60 05/20/2020 0734   GFRAA >60 09/10/2019 1230   Lipid Panel    Component Value Date/Time   CHOL 135 09/30/2015 0946   TRIG 113 09/30/2015 0946  HDL 32 (L) 09/30/2015 0946    CHOLHDL 4.2 09/30/2015 0946   VLDL 23 09/30/2015 0946   LDLCALC 80 09/30/2015 0946   LDLDIRECT 111 (H) 03/06/2013 1054    Additional studies/ records that were reviewed today include:  Extensive review of notes, labs, ECG and imaging studies from February 2022 admission ASSESSMENT:    1. Chronic diastolic heart failure ([place])   2. Nonischemic cardiomyopathy ([place])   3. Status post biventricular pacemaker   4. Paroxysmal atrial flutter ([facility])   5. Chronic obstructive pulmonary disease, unspecified COPD type ([place])   6. Chronic respiratory failure with hypoxia ([facility])   7. Essential hypertension   8. Acquired hypothyroidism      PLAN:  In order of problems listed above:  1. CHF: With the limitations of a video exam, as far as I can tell she is euvolemic.  She has not been able to weigh.  It is were normal when she most recently was hospitalized in February.  PE during that admission was only 80.  She has had a remarkable normalization of left ventricular systolic function, but there may be mild mitral stenosis due to annular calcification but this does not appear to be clinically important.  A very low-dose of loop diuretic appears appropriate. 2. NICMP: Now with normal LVEF. 3. CRT-P: As far as I can tell from the ECG from February the device was functioning normally, but we need to reestablish remote device downloads.  She is unable to come to the office for in person visits.  We will try to figure that out today with our device clinic. 4. Episode of atrial flutter was only 48 seconds in duration and occurred 3 years ago.  She has had numerous falls and I think the risk of anticoagulation exceeds any possible benefit. 5. COPD: With chronic respiratory failure on home O2 at 2 L.  Appears to be stable at this time. 6. HTN: Has been on carvedilol for a long time.  She does not require rescue inhalers at all.  Nevertheless, it appears appropriate to switch to a more selective beta-blocker.  We  will start bisoprolol 10 mg daily instead of the carvedilol. 7. Hypothyroidism: Most recent TSH was borderline elevated in February, but her free T4 was normal, suspect sick euthyroid syndrome.  Time:   Today, I have spent 23 minutes with the patient with telehealth technology discussing the above problems.      Medication Adjustments/Labs and Tests Ordered: Current medicines are reviewed at length with the patient today.  Concerns regarding medicines are outlined above.  Medication changes, Labs and Tests ordered today are listed in the Patient Instructions below. Patient Instructions  Medication Instructions:  STOP the Carvedilol  START Bisoprolol 10 mg once daily TAKE Furosemide 40 mg (two of the 20 mg tablets) in the morning and one tablet in the afternoon  *If you need a refill on your cardiac medications before your next appointment, please call your pharmacy*   Lab Work: None ordered If you have labs (blood work) drawn today and your tests are completely normal, you will receive your results only by: Marland Kitchen MyChart Message (if you have MyChart) OR . A paper copy in the mail If you have any lab test that is abnormal or we need to change your treatment, we will call you to review the results.   Testing/Procedures: None ordered   Follow-Up: At [facility], you and your health needs are our priority.  As part of our continuing mission  to provide you with exceptional heart care, we have created designated Provider Care Teams.  These Care Teams include your primary Cardiologist (physician) and Advanced Practice Providers (APPs -  Physician Assistants and Nurse Practitioners) who all work together to provide you with the care you need, when you need it.  We recommend signing up for the patient portal called "MyChart".  Sign up information is provided on this After Visit Summary.  MyChart is used to connect with patients for Virtual Visits (Telemedicine).  Patients are able to view  lab/test results, encounter notes, upcoming appointments, etc.  Non-urgent messages can be sent to your provider as well.   To learn more about what you can do with MyChart, go to [email or web address].    Your next appointment:   6 month(s)  The format for your next appointment:   In Person  Provider:   Sanda Klein, MD          Signed, Sanda Klein, MD  08/05/2020 6:23 PM    [place], [place], [place]  [address] Phone: [phone]; Fax: [phone]

## 2020-08-04 NOTE — Telephone Encounter (Signed)
Unsuccessful telephone encounter to [name] to follow up on remote monitor and failure to transmit since 08/2019. Given number was daughter [name]'s cell. Hipaa compliant VM message left requesting call back to [phone] to assess reason for missed transmissions.

## 2020-08-04 NOTE — Patient Instructions (Signed)
Medication Instructions:  STOP the Carvedilol  START Bisoprolol 10 mg once daily TAKE Furosemide 40 mg (two of the 20 mg tablets) in the morning and one tablet in the afternoon  *If you need a refill on your cardiac medications before your next appointment, please call your pharmacy*   Lab Work: None ordered If you have labs (blood work) drawn today and your tests are completely normal, you will receive your results only by: Marland Kitchen MyChart Message (if you have MyChart) OR . A paper copy in the mail If you have any lab test that is abnormal or we need to change your treatment, we will call you to review the results.   Testing/Procedures: None ordered   Follow-Up: At [facility], you and your health needs are our priority.  As part of our continuing mission to provide you with exceptional heart care, we have created designated Provider Care Teams.  These Care Teams include your primary Cardiologist (physician) and Advanced Practice Providers (APPs -  Physician Assistants and Nurse Practitioners) who all work together to provide you with the care you need, when you need it.  We recommend signing up for the patient portal called "MyChart".  Sign up information is provided on this After Visit Summary.  MyChart is used to connect with patients for Virtual Visits (Telemedicine).  Patients are able to view lab/test results, encounter notes, upcoming appointments, etc.  Non-urgent messages can be sent to your provider as well.   To learn more about what you can do with MyChart, go to [email or web address].    Your next appointment:   6 month(s)  The format for your next appointment:   In Person  Provider:   Sanda Klein, MD

## 2020-08-04 NOTE — Telephone Encounter (Signed)
The patient has been called about the virtual appointment today with Dr. Sallyanne Kuster. Instructions provided. The AVS will be mailed. The patient verbalized their understanding.     Patient Consent for Virtual Visit         [name] has provided verbal consent on 08/04/2020 for a virtual visit (video or telephone).   CONSENT FOR VIRTUAL VISIT FOR:  [name]  By participating in this virtual visit I agree to the following:  I hereby voluntarily request, consent and authorize [facility] and its employed or contracted physicians, physician assistants, nurse practitioners or other licensed health care professionals (the Practitioner), to provide me with telemedicine health care services (the "Services") as deemed necessary by the treating Practitioner. I acknowledge and consent to receive the Services by the Practitioner via telemedicine. I understand that the telemedicine visit will involve communicating with the Practitioner through live audiovisual communication technology and the disclosure of certain medical information by electronic transmission. I acknowledge that I have been given the opportunity to request an in-person assessment or other available alternative prior to the telemedicine visit and am voluntarily participating in the telemedicine visit.  I understand that I have the right to withhold or withdraw my consent to the use of telemedicine in the course of my care at any time, without affecting my right to future care or treatment, and that the Practitioner or I may terminate the telemedicine visit at any time. I understand that I have the right to inspect all information obtained and/or recorded in the course of the telemedicine visit and may receive copies of available information for a reasonable fee.  I understand that some of the potential risks of receiving the Services via telemedicine include:  Marland Kitchen Delay or interruption in medical evaluation due to technological equipment  failure or disruption; . Information transmitted may not be sufficient (e.g. poor resolution of images) to allow for appropriate medical decision making by the Practitioner; and/or  . In rare instances, security protocols could fail, causing a breach of personal health information.  Furthermore, I acknowledge that it is my responsibility to provide information about my medical history, conditions and care that is complete and accurate to the best of my ability. I acknowledge that Practitioner's advice, recommendations, and/or decision may be based on factors not within their control, such as incomplete or inaccurate data provided by me or distortions of diagnostic images or specimens that may result from electronic transmissions. I understand that the practice of medicine is not an exact science and that Practitioner makes no warranties or guarantees regarding treatment outcomes. I acknowledge that a copy of this consent can be made available to me via my patient portal ([facility]), or I can request a printed copy by calling the office of [facility].    I understand that my insurance will be billed for this visit.   I have read or had this consent read to me. . I understand the contents of this consent, which adequately explains the benefits and risks of the Services being provided via telemedicine.  . I have been provided ample opportunity to ask questions regarding this consent and the Services and have had my questions answered to my satisfaction. . I give my informed consent for the services to be provided through the use of telemedicine in my medical care

## 2020-08-04 NOTE — Telephone Encounter (Signed)
-----   Message from Sanda Klein, MD sent at 08/04/2020  9:57 AM EDT ----- We have not had a download since the device was replaced. Home monitor model # [id number]. Serial number [id number] Please resume downloads Let me know if I need to call MDT rep.if not working

## 2020-08-05 ENCOUNTER — Encounter: Payer: Self-pay | Admitting: Cardiovascular Disease

## 2020-08-12 ENCOUNTER — Telehealth: Payer: Self-pay

## 2020-08-12 NOTE — Telephone Encounter (Signed)
I left a voicemail for the patient daughter n law. Patient do not have a monitor listed in [place]. We need to know if patient has a monitor? If she does then we will need the model and serial number of the monitor to put in [facility]. If she do not we will need to order a monitor.

## 2020-08-12 NOTE — Telephone Encounter (Signed)
I left a voicemail on the daughter in law phone. I need to know if the patient has a monitor. There is no monitor information listed in [facility]. If she do have a monitor we would need the monitor model / serial number.

## 2020-08-25 ENCOUNTER — Other Ambulatory Visit: Payer: Self-pay | Admitting: Internal Medicine

## 2020-09-03 NOTE — Telephone Encounter (Signed)
Letter sent.

## 2020-11-05 ENCOUNTER — Ambulatory Visit (INDEPENDENT_AMBULATORY_CARE_PROVIDER_SITE_OTHER): Payer: Medicare Other

## 2020-11-05 DIAGNOSIS — I428 Other cardiomyopathies: Secondary | ICD-10-CM

## 2020-11-06 LAB — CUP PACEART REMOTE DEVICE CHECK
Battery Remaining Longevity: 106 mo
Battery Voltage: 3.02 V
Brady Statistic AP VP Percent: 5.17 %
Brady Statistic AP VS Percent: 0.11 %
Brady Statistic AS VP Percent: 92.4 %
Brady Statistic AS VS Percent: 2.31 %
Brady Statistic RA Percent Paced: 5.3 %
Brady Statistic RV Percent Paced: 1.6 %
Date Time Interrogation Session: 20220809160121
Implantable Lead Implant Date: 20090331
Implantable Lead Implant Date: 20090331
Implantable Lead Implant Date: 20090401
Implantable Lead Location: 753858
Implantable Lead Location: 753859
Implantable Lead Location: 753860
Implantable Lead Model: 4194
Implantable Lead Model: 5076
Implantable Lead Model: 6947
Implantable Pulse Generator Implant Date: 20210614
Lead Channel Impedance Value: 342 Ohm
Lead Channel Impedance Value: 399 Ohm
Lead Channel Impedance Value: 418 Ohm
Lead Channel Impedance Value: 494 Ohm
Lead Channel Impedance Value: 494 Ohm
Lead Channel Impedance Value: 589 Ohm
Lead Channel Impedance Value: 608 Ohm
Lead Channel Impedance Value: 608 Ohm
Lead Channel Impedance Value: 836 Ohm
Lead Channel Pacing Threshold Amplitude: 0.875 V
Lead Channel Pacing Threshold Amplitude: 1.5 V
Lead Channel Pacing Threshold Amplitude: 1.625 V
Lead Channel Pacing Threshold Pulse Width: 0.4 ms
Lead Channel Pacing Threshold Pulse Width: 0.4 ms
Lead Channel Pacing Threshold Pulse Width: 0.8 ms
Lead Channel Sensing Intrinsic Amplitude: 16 mV
Lead Channel Sensing Intrinsic Amplitude: 16 mV
Lead Channel Sensing Intrinsic Amplitude: 4.375 mV
Lead Channel Sensing Intrinsic Amplitude: 4.375 mV
Lead Channel Setting Pacing Amplitude: 1.75 V
Lead Channel Setting Pacing Amplitude: 2.25 V
Lead Channel Setting Pacing Amplitude: 2.5 V
Lead Channel Setting Pacing Pulse Width: 0.8 ms
Lead Channel Setting Pacing Pulse Width: 0.8 ms
Lead Channel Setting Sensing Sensitivity: 1.2 mV

## 2020-11-28 NOTE — Progress Notes (Signed)
Remote pacemaker transmission.   

## 2020-12-15 DIAGNOSIS — F329 Major depressive disorder, single episode, unspecified: Secondary | ICD-10-CM | POA: Diagnosis not present

## 2020-12-15 DIAGNOSIS — I10 Essential (primary) hypertension: Secondary | ICD-10-CM | POA: Diagnosis not present

## 2020-12-15 DIAGNOSIS — J449 Chronic obstructive pulmonary disease, unspecified: Secondary | ICD-10-CM | POA: Diagnosis not present

## 2020-12-15 DIAGNOSIS — M810 Age-related osteoporosis without current pathological fracture: Secondary | ICD-10-CM | POA: Diagnosis not present

## 2020-12-15 DIAGNOSIS — G894 Chronic pain syndrome: Secondary | ICD-10-CM | POA: Diagnosis not present

## 2020-12-15 DIAGNOSIS — E039 Hypothyroidism, unspecified: Secondary | ICD-10-CM | POA: Diagnosis not present

## 2020-12-15 DIAGNOSIS — G620 Drug-induced polyneuropathy: Secondary | ICD-10-CM | POA: Diagnosis not present

## 2020-12-15 DIAGNOSIS — I5042 Chronic combined systolic (congestive) and diastolic (congestive) heart failure: Secondary | ICD-10-CM | POA: Diagnosis not present

## 2020-12-15 DIAGNOSIS — F0280 Dementia in other diseases classified elsewhere without behavioral disturbance: Secondary | ICD-10-CM | POA: Diagnosis not present

## 2020-12-16 DIAGNOSIS — R609 Edema, unspecified: Secondary | ICD-10-CM | POA: Diagnosis not present

## 2020-12-16 DIAGNOSIS — I4892 Unspecified atrial flutter: Secondary | ICD-10-CM | POA: Diagnosis not present

## 2020-12-16 DIAGNOSIS — S82845A Nondisplaced bimalleolar fracture of left lower leg, initial encounter for closed fracture: Secondary | ICD-10-CM | POA: Diagnosis not present

## 2020-12-16 DIAGNOSIS — G8929 Other chronic pain: Secondary | ICD-10-CM | POA: Diagnosis not present

## 2020-12-16 DIAGNOSIS — S82842A Displaced bimalleolar fracture of left lower leg, initial encounter for closed fracture: Secondary | ICD-10-CM | POA: Diagnosis not present

## 2020-12-16 DIAGNOSIS — R296 Repeated falls: Secondary | ICD-10-CM | POA: Diagnosis not present

## 2020-12-16 DIAGNOSIS — G8911 Acute pain due to trauma: Secondary | ICD-10-CM | POA: Diagnosis not present

## 2020-12-16 DIAGNOSIS — S99922A Unspecified injury of left foot, initial encounter: Secondary | ICD-10-CM | POA: Diagnosis not present

## 2020-12-16 DIAGNOSIS — Z743 Need for continuous supervision: Secondary | ICD-10-CM | POA: Diagnosis not present

## 2020-12-16 DIAGNOSIS — I5032 Chronic diastolic (congestive) heart failure: Secondary | ICD-10-CM | POA: Diagnosis not present

## 2020-12-16 DIAGNOSIS — T451X5A Adverse effect of antineoplastic and immunosuppressive drugs, initial encounter: Secondary | ICD-10-CM | POA: Diagnosis not present

## 2020-12-16 DIAGNOSIS — R531 Weakness: Secondary | ICD-10-CM | POA: Diagnosis not present

## 2020-12-16 DIAGNOSIS — W19XXXA Unspecified fall, initial encounter: Secondary | ICD-10-CM | POA: Diagnosis not present

## 2020-12-16 DIAGNOSIS — N179 Acute kidney failure, unspecified: Secondary | ICD-10-CM | POA: Diagnosis not present

## 2020-12-16 DIAGNOSIS — J9611 Chronic respiratory failure with hypoxia: Secondary | ICD-10-CM | POA: Diagnosis not present

## 2020-12-16 DIAGNOSIS — Z993 Dependence on wheelchair: Secondary | ICD-10-CM | POA: Diagnosis not present

## 2020-12-16 DIAGNOSIS — Z9181 History of falling: Secondary | ICD-10-CM | POA: Diagnosis not present

## 2020-12-16 DIAGNOSIS — I110 Hypertensive heart disease with heart failure: Secondary | ICD-10-CM | POA: Diagnosis not present

## 2020-12-16 DIAGNOSIS — S82841A Displaced bimalleolar fracture of right lower leg, initial encounter for closed fracture: Secondary | ICD-10-CM | POA: Diagnosis not present

## 2020-12-16 DIAGNOSIS — Z7401 Bed confinement status: Secondary | ICD-10-CM | POA: Diagnosis not present

## 2020-12-16 DIAGNOSIS — J449 Chronic obstructive pulmonary disease, unspecified: Secondary | ICD-10-CM | POA: Diagnosis not present

## 2020-12-16 DIAGNOSIS — R0902 Hypoxemia: Secondary | ICD-10-CM | POA: Diagnosis not present

## 2020-12-16 DIAGNOSIS — D7589 Other specified diseases of blood and blood-forming organs: Secondary | ICD-10-CM | POA: Diagnosis not present

## 2020-12-16 DIAGNOSIS — E785 Hyperlipidemia, unspecified: Secondary | ICD-10-CM | POA: Diagnosis not present

## 2020-12-16 DIAGNOSIS — S99921A Unspecified injury of right foot, initial encounter: Secondary | ICD-10-CM | POA: Diagnosis not present

## 2020-12-16 DIAGNOSIS — I10 Essential (primary) hypertension: Secondary | ICD-10-CM | POA: Diagnosis not present

## 2020-12-16 DIAGNOSIS — F0390 Unspecified dementia without behavioral disturbance: Secondary | ICD-10-CM | POA: Diagnosis not present

## 2020-12-16 DIAGNOSIS — S82844A Nondisplaced bimalleolar fracture of right lower leg, initial encounter for closed fracture: Secondary | ICD-10-CM | POA: Diagnosis not present

## 2020-12-16 DIAGNOSIS — Z9049 Acquired absence of other specified parts of digestive tract: Secondary | ICD-10-CM | POA: Diagnosis not present

## 2020-12-16 DIAGNOSIS — I428 Other cardiomyopathies: Secondary | ICD-10-CM | POA: Diagnosis not present

## 2020-12-16 DIAGNOSIS — Z66 Do not resuscitate: Secondary | ICD-10-CM | POA: Diagnosis not present

## 2020-12-16 DIAGNOSIS — S92134A Nondisplaced fracture of posterior process of right talus, initial encounter for closed fracture: Secondary | ICD-10-CM | POA: Diagnosis not present

## 2020-12-16 DIAGNOSIS — G894 Chronic pain syndrome: Secondary | ICD-10-CM | POA: Diagnosis not present

## 2020-12-16 DIAGNOSIS — E039 Hypothyroidism, unspecified: Secondary | ICD-10-CM | POA: Diagnosis not present

## 2020-12-16 DIAGNOSIS — I959 Hypotension, unspecified: Secondary | ICD-10-CM | POA: Diagnosis not present

## 2020-12-16 DIAGNOSIS — G620 Drug-induced polyneuropathy: Secondary | ICD-10-CM | POA: Diagnosis not present

## 2020-12-23 DIAGNOSIS — T451X5D Adverse effect of antineoplastic and immunosuppressive drugs, subsequent encounter: Secondary | ICD-10-CM | POA: Diagnosis not present

## 2020-12-23 DIAGNOSIS — E039 Hypothyroidism, unspecified: Secondary | ICD-10-CM | POA: Diagnosis not present

## 2020-12-23 DIAGNOSIS — I10 Essential (primary) hypertension: Secondary | ICD-10-CM | POA: Diagnosis not present

## 2020-12-23 DIAGNOSIS — G894 Chronic pain syndrome: Secondary | ICD-10-CM | POA: Diagnosis not present

## 2020-12-23 DIAGNOSIS — Z9181 History of falling: Secondary | ICD-10-CM | POA: Diagnosis not present

## 2020-12-23 DIAGNOSIS — J449 Chronic obstructive pulmonary disease, unspecified: Secondary | ICD-10-CM | POA: Diagnosis not present

## 2020-12-23 DIAGNOSIS — J9611 Chronic respiratory failure with hypoxia: Secondary | ICD-10-CM | POA: Diagnosis not present

## 2020-12-23 DIAGNOSIS — N179 Acute kidney failure, unspecified: Secondary | ICD-10-CM | POA: Diagnosis not present

## 2020-12-23 DIAGNOSIS — I4892 Unspecified atrial flutter: Secondary | ICD-10-CM | POA: Diagnosis not present

## 2020-12-23 DIAGNOSIS — Z9981 Dependence on supplemental oxygen: Secondary | ICD-10-CM | POA: Diagnosis not present

## 2020-12-23 DIAGNOSIS — S82841D Displaced bimalleolar fracture of right lower leg, subsequent encounter for closed fracture with routine healing: Secondary | ICD-10-CM | POA: Diagnosis not present

## 2020-12-23 DIAGNOSIS — Z8543 Personal history of malignant neoplasm of ovary: Secondary | ICD-10-CM | POA: Diagnosis not present

## 2020-12-23 DIAGNOSIS — Z950 Presence of cardiac pacemaker: Secondary | ICD-10-CM | POA: Diagnosis not present

## 2020-12-23 DIAGNOSIS — I428 Other cardiomyopathies: Secondary | ICD-10-CM | POA: Diagnosis not present

## 2020-12-23 DIAGNOSIS — S82842D Displaced bimalleolar fracture of left lower leg, subsequent encounter for closed fracture with routine healing: Secondary | ICD-10-CM | POA: Diagnosis not present

## 2020-12-23 DIAGNOSIS — G620 Drug-induced polyneuropathy: Secondary | ICD-10-CM | POA: Diagnosis not present

## 2020-12-25 DIAGNOSIS — S82892D Other fracture of left lower leg, subsequent encounter for closed fracture with routine healing: Secondary | ICD-10-CM | POA: Diagnosis not present

## 2020-12-25 DIAGNOSIS — S82891D Other fracture of right lower leg, subsequent encounter for closed fracture with routine healing: Secondary | ICD-10-CM | POA: Diagnosis not present

## 2021-01-06 DIAGNOSIS — S82891D Other fracture of right lower leg, subsequent encounter for closed fracture with routine healing: Secondary | ICD-10-CM | POA: Diagnosis not present

## 2021-01-06 DIAGNOSIS — L89159 Pressure ulcer of sacral region, unspecified stage: Secondary | ICD-10-CM | POA: Diagnosis not present

## 2021-01-06 DIAGNOSIS — S82892D Other fracture of left lower leg, subsequent encounter for closed fracture with routine healing: Secondary | ICD-10-CM | POA: Diagnosis not present

## 2021-01-12 ENCOUNTER — Encounter (HOSPITAL_COMMUNITY): Payer: Self-pay

## 2021-01-12 ENCOUNTER — Emergency Department (HOSPITAL_COMMUNITY)
Admission: EM | Admit: 2021-01-12 | Discharge: 2021-01-12 | Disposition: A | Discharge status: Home / Self Care | Payer: Medicare Other | Attending: Emergency Medicine | Admitting: Emergency Medicine

## 2021-01-12 ENCOUNTER — Other Ambulatory Visit: Payer: Self-pay

## 2021-01-12 ENCOUNTER — Emergency Department (HOSPITAL_COMMUNITY): Payer: Medicare Other

## 2021-01-12 DIAGNOSIS — K5641 Fecal impaction: Secondary | ICD-10-CM | POA: Insufficient documentation

## 2021-01-12 DIAGNOSIS — Z950 Presence of cardiac pacemaker: Secondary | ICD-10-CM | POA: Insufficient documentation

## 2021-01-12 DIAGNOSIS — I5032 Chronic diastolic (congestive) heart failure: Secondary | ICD-10-CM | POA: Insufficient documentation

## 2021-01-12 DIAGNOSIS — Z8543 Personal history of malignant neoplasm of ovary: Secondary | ICD-10-CM | POA: Diagnosis not present

## 2021-01-12 DIAGNOSIS — E039 Hypothyroidism, unspecified: Secondary | ICD-10-CM | POA: Diagnosis not present

## 2021-01-12 DIAGNOSIS — J441 Chronic obstructive pulmonary disease with (acute) exacerbation: Secondary | ICD-10-CM | POA: Diagnosis not present

## 2021-01-12 DIAGNOSIS — Z79899 Other long term (current) drug therapy: Secondary | ICD-10-CM | POA: Diagnosis not present

## 2021-01-12 DIAGNOSIS — R1084 Generalized abdominal pain: Secondary | ICD-10-CM | POA: Diagnosis not present

## 2021-01-12 DIAGNOSIS — I110 Hypertensive heart disease with heart failure: Secondary | ICD-10-CM | POA: Insufficient documentation

## 2021-01-12 DIAGNOSIS — Z7982 Long term (current) use of aspirin: Secondary | ICD-10-CM | POA: Insufficient documentation

## 2021-01-12 DIAGNOSIS — R0902 Hypoxemia: Secondary | ICD-10-CM | POA: Diagnosis not present

## 2021-01-12 DIAGNOSIS — K5900 Constipation, unspecified: Secondary | ICD-10-CM | POA: Diagnosis not present

## 2021-01-12 LAB — COMPREHENSIVE METABOLIC PANEL
ALT: 11 U/L (ref 0–44)
AST: 22 U/L (ref 15–41)
Albumin: 3.8 g/dL (ref 3.5–5.0)
Alkaline Phosphatase: 73 U/L (ref 38–126)
Anion gap: 9 (ref 5–15)
BUN: 38 mg/dL — ABNORMAL HIGH (ref 8–23)
CO2: 34 mmol/L — ABNORMAL HIGH (ref 22–32)
Calcium: 9.3 mg/dL (ref 8.9–10.3)
Chloride: 95 mmol/L — ABNORMAL LOW (ref 98–111)
Creatinine, Ser: 0.93 mg/dL (ref 0.44–1.00)
GFR, Estimated: 58 mL/min — ABNORMAL LOW (ref >60)
Glucose, Bld: 133 mg/dL — ABNORMAL HIGH (ref 70–99)
Potassium: 4.6 mmol/L (ref 3.5–5.1)
Sodium: 138 mmol/L (ref 135–145)
Total Bilirubin: 0.7 mg/dL (ref 0.3–1.2)
Total Protein: 7.8 g/dL (ref 6.5–8.1)

## 2021-01-12 LAB — CBC WITH DIFFERENTIAL/PLATELET
Abs Immature Granulocytes: 0.04 10*3/uL (ref 0.00–0.07)
Basophils Absolute: 0.1 10*3/uL (ref 0.0–0.1)
Basophils Relative: 1 %
Eosinophils Absolute: 0.3 10*3/uL (ref 0.0–0.5)
Eosinophils Relative: 3 %
HCT: 42.3 % (ref 36.0–46.0)
Hemoglobin: 13.2 g/dL (ref 12.0–15.0)
Immature Granulocytes: 0 %
Lymphocytes Relative: 11 %
Lymphs Abs: 1.1 10*3/uL (ref 0.7–4.0)
MCH: 31.2 pg (ref 26.0–34.0)
MCHC: 31.2 g/dL (ref 30.0–36.0)
MCV: 100 fL (ref 80.0–100.0)
Monocytes Absolute: 0.6 10*3/uL (ref 0.1–1.0)
Monocytes Relative: 6 %
Neutro Abs: 8.1 10*3/uL — ABNORMAL HIGH (ref 1.7–7.7)
Neutrophils Relative %: 79 %
Platelets: 180 10*3/uL (ref 150–400)
RBC: 4.23 MIL/uL (ref 3.87–5.11)
RDW: 12.3 % (ref 11.5–15.5)
WBC: 10.2 10*3/uL (ref 4.0–10.5)
nRBC: 0 % (ref 0.0–0.2)

## 2021-01-12 MED ORDER — DOCUSATE SODIUM 100 MG PO CAPS: 100.0000 mg | ORAL_CAPSULE | Freq: Two times a day (BID) | ORAL | 0 refills | Status: DC

## 2021-01-12 MED ORDER — MILK AND MOLASSES ENEMA: 1.0000 | Freq: Once | RECTAL | Status: DC

## 2021-01-12 MED ORDER — OXYCODONE HCL 5 MG PO TABS
15.0000 mg | ORAL_TABLET | Freq: Once | ORAL | Status: AC
  Administered 2021-01-12: 15 mg via ORAL
  Filled 2021-01-12: qty 3

## 2021-01-12 MED ORDER — POLYETHYLENE GLYCOL 3350 17 G PO PACK: 17.0000 g | PACK | Freq: Every day | ORAL | 0 refills | Status: DC

## 2021-01-12 MED ORDER — MINERAL OIL RE ENEM
1.0000 | ENEMA | Freq: Once | RECTAL | Status: AC
  Administered 2021-01-12: 1 via RECTAL
  Filled 2021-01-12: qty 1

## 2021-01-12 MED ORDER — MILK AND MOLASSES ENEMA
1.0000 | Freq: Once | RECTAL | Status: DC
  Filled 2021-01-12: qty 240

## 2021-01-12 MED ORDER — SODIUM CHLORIDE 0.9 % IV BOLUS
500.0000 mL | Freq: Once | INTRAVENOUS | Status: AC
  Administered 2021-01-12: 500 mL via INTRAVENOUS

## 2021-01-12 NOTE — ED Triage Notes (Signed)
Patient presents from home due to abdominal pain. She reports inability to have a normal bowel movement in the last 4 days. Today she was able to get some bowel out after attempting to disimpact herself with her finger. A&O x4  HX CHF, O2 use  EMS vitals: 124/67 BP 97% O2 sats on 2L nasal canula 74 HR

## 2021-01-12 NOTE — Discharge Instructions (Addendum)
If you develop new or worsening difficulty having a bowel movement or inability to have a bowel movement or if you develop abdominal pain or distention, vomiting, fever, or any other new/concerning symptoms or return to the ER for evaluation.

## 2021-01-12 NOTE — ED Provider Notes (Signed)
[facility] DEPT Provider Note   CSN: [id number] Arrival date & time: 01/12/21  1615     History Chief Complaint  Patient presents with   Constipation    [name] is a 87 y.o. female.  HPI 86 year old female presents with constipation.  She has not had a bowel movement about a week.  Her abdomen has been distended but there is no pain or vomiting.  She states that she is passing very little to no gas.  She tried a stool softener with no relief.  Past Medical History:  Diagnosis Date   AICD (automatic cardioverter/defibrillator) present 06/27/2007   medtronic   ARF (acute renal failure) ([place]) 09/2017   Cancer ([place])    ovarian--stage 4   Cardiomyopathy ([name])    CHF (congestive heart failure) ([place])    COPD (chronic obstructive pulmonary disease) ([place])    Dyspnea    Hypothyroidism    Peripheral neuropathy    Systemic hypertension    Thyroid disease     Patient Active Problem List   Diagnosis Date Noted   Frequent falls 05/17/2020   Macrocytosis 05/17/2020   Chronic pain syndrome 05/17/2020   Acute on chronic heart failure with preserved ejection fraction (HFpEF) ([place]) 02/11/2020   Essential hypertension 02/11/2020   Fracture of left ankle, closed, initial encounter 02/11/2020   Pacemaker 02/11/2020   Fall at home, initial encounter 02/11/2020   Prolonged QT interval 02/11/2020   ICD (implantable cardioverter-defibrillator) battery depletion    Pacemaker battery depletion 09/09/2019   Acute respiratory failure ([place]) 03/15/2019   COPD exacerbation ([place]) 03/15/2019   Acute respiratory failure with hypoxia ([place]) 03/14/2019   COPD with acute exacerbation ([place]) 03/14/2019   Digoxin toxicity 10/04/2017   Closed fracture of distal end of radius 06/29/2017   Acute encephalopathy    AKI (acute kidney injury) ([place])    Dysarthria 09/29/2015   Abnormal liver function 09/29/2015   Acute renal failure (ARF) ([place]) 09/29/2015   Hyponatremia  09/29/2015   Small bowel obstruction ([place]) 05/17/2015   Hypothyroidism 05/17/2015   Uncontrolled hypertension 05/17/2015   Hypocalcemia 05/17/2015   Leukocytosis 05/17/2015   Paroxysmal atrial flutter ([place]) 04/24/2015   Incarcerated incisional hernia 03/28/2015   SBO (small bowel obstruction) ([place]) 03/28/2015   Nonischemic cardiomyopathy ([place]) 11/27/2012   Biventricular ICD  11/27/2012   Chronic diastolic heart failure ([place]) 11/27/2012   Peripheral neuropathy secondary to chemotherapy 11/27/2012   Hyperlipidemia 11/27/2012   COPD (chronic obstructive pulmonary disease) ([place]) 12/03/2011   DOE (dyspnea on exertion) 11/05/2011    Past Surgical History:  Procedure Laterality Date   ABDOMINAL HYSTERECTOMY  oct 2000   along with ovarian ca surg   APPENDECTOMY     BIV PACEMAKER GENERATOR CHANGEOUT N/A 09/10/2019   Procedure: [place];  Surgeon: Sanda Klein, MD;  Location: [facility];  Service: Cardiovascular;  Laterality: N/A;   CARDIAC CATHETERIZATION  09/27/1990   normal coronaries,dilated cardiomyopathy   CARDIAC DEFIBRILLATOR PLACEMENT  06/27/2007   medtronic concerto   CHOLECYSTECTOMY     IMPLANTABLE CARDIOVERTER DEFIBRILLATOR GENERATOR CHANGE N/A 07/31/2013   Procedure: IMPLANTABLE CARDIOVERTER DEFIBRILLATOR GENERATOR CHANGE;  Surgeon: Sanda Klein, MD;  Location: [facility];  Service: Cardiovascular;  Laterality: N/A;   NM MYOCAR PERF WALL MOTION  06/05/2007   no ischemia     OB History     Gravida  2   Para      Term      Preterm  AB      Living  2      SAB      IAB      Ectopic      Multiple      Live Births              Family History  Problem Relation Age of Onset   Lung cancer Mother        died from cancer   Heart attack Father    Lung cancer Father    Heart disease Paternal Grandmother     Social History   Tobacco Use   Smoking status: Never   Smokeless tobacco: Never  Vaping Use   Vaping Use:  Never used  Substance Use Topics   Alcohol use: No   Drug use: No    Home Medications Prior to Admission medications   Medication Sig Start Date End Date Taking? Authorizing Provider  aspirin EC 81 MG tablet Take 1 tablet (81 mg total) by mouth daily. Patient taking differently: Take 81 mg by mouth daily as needed (bloodthinner). 10/03/17  Yes Croitoru, Mihai, MD  carvedilol (COREG) 12.5 MG tablet Take 12.5 mg by mouth in the morning and at bedtime.   Yes [provider]  Cholecalciferol (VITAMIN D3 PO) Take 2 tablets by mouth daily.   Yes [provider]  docusate sodium (COLACE) 100 MG capsule Take 1 capsule (100 mg total) by mouth every 12 (twelve) hours. 01/12/21  Yes Sherwood Gambler, MD  dorzolamide-timolol (COSOPT) 22.3-6.8 MG/ML ophthalmic solution Place 1 drop into both eyes 2 (two) times daily. 02/18/20  Yes Adhikari, Tamsen Meek, MD  DULERA 100-5 MCG/ACT AERO Inhale 2 puffs into the lungs every 6 (six) hours as needed for wheezing or shortness of breath.  08/29/19  Yes [provider]  DULoxetine (CYMBALTA) 60 MG capsule Take 60 mg by mouth daily.  08/25/15  Yes [provider]  furosemide (LASIX) 20 MG tablet Take 40 mg (two tablets) in the morning and 20 mg in the evening. 08/04/20  Yes Croitoru, Mihai, MD  gabapentin (NEURONTIN) 100 MG capsule Take 100 mg by mouth 3 (three) times daily.   Yes [provider]  latanoprost (XALATAN) 0.005 % ophthalmic solution Place 1 drop into both eyes at bedtime. 02/18/20  Yes Shelly Coss, MD  liothyronine (CYTOMEL) 25 MCG tablet Take 25 mcg by mouth daily. 07/02/15  Yes [provider]  oxyCODONE (ROXICODONE) 15 MG immediate release tablet Take 1 tablet (15 mg total) by mouth every 6 (six) hours as needed for pain. Patient taking differently: Take 15 mg by mouth in the morning, at noon, in the evening, and at bedtime. Takes at [place] 2100 02/17/20  Yes Adhikari, Tamsen Meek, MD  polyethylene glycol  (MIRALAX / GLYCOLAX) 17 g packet Take 17 g by mouth daily. 01/12/21  Yes Sherwood Gambler, MD  Prenatal Vit-Fe Fumarate-FA (PRENATAL VITAMINS PO) Take 1 tablet by mouth daily.   Yes [provider]  SYNTHROID 112 MCG tablet Take 112 mcg by mouth daily before breakfast.  09/12/15  Yes [provider]  traZODone (DESYREL) 50 MG tablet Take 50 mg by mouth at bedtime as needed for sleep.  01/10/20  Yes [provider]  Zinc Oxide 10 % OINT Apply 1 application topically 4 (four) times daily as needed (bed sores).   Yes [provider]  bisoprolol (ZEBETA) 10 MG tablet Take 1 tablet (10 mg total) by mouth daily. Patient not taking: No sig reported 08/04/20  Croitoru, Mihai, MD  furosemide (LASIX) 40 MG tablet TAKE 1 TABLET BY MOUTH EVERY DAY Patient not taking: No sig reported 08/27/20   Croitoru, Mihai, MD    Allergies    Digitalis  Review of Systems   Review of Systems  Constitutional:  Negative for fever.  Gastrointestinal:  Positive for abdominal distention and constipation. Negative for abdominal pain and vomiting.  All other systems reviewed and are negative.  Physical Exam Updated Vital Signs BP (!) 126/56   Pulse 88   Temp 98.6 F (37 C) (Oral)   Resp 18   Ht 5\' 1"  (1.549 m)   Wt 65.8 kg   SpO2 94%   BMI 27.40 kg/m   Physical Exam Vitals and nursing note reviewed. Exam conducted with a chaperone present.  Constitutional:      General: She is not in acute distress.    Appearance: She is well-developed. She is not diaphoretic.  HENT:     Head: Normocephalic and atraumatic.     Right Ear: External ear normal.     Left Ear: External ear normal.     Nose: Nose normal.  Eyes:     General:        Right eye: No discharge.        Left eye: No discharge.  Cardiovascular:     Rate and Rhythm: Normal rate and regular rhythm.     Heart sounds: Normal heart sounds.  Pulmonary:     Effort: Pulmonary effort is normal.     Breath sounds: Normal  breath sounds.  Abdominal:     General: There is distension.     Palpations: Abdomen is soft.     Tenderness: There is no abdominal tenderness.  Genitourinary:    Comments: There is a stool ball in the diaper. Otherwise there is some soft stool several cm from anus. No gross blood Skin:    General: Skin is warm and dry.  Neurological:     Mental Status: She is alert.  Psychiatric:        Mood and Affect: Mood is not anxious.    ED Results / Procedures / Treatments   Labs (all labs ordered are listed, but only abnormal results are displayed) Labs Reviewed  COMPREHENSIVE METABOLIC PANEL - Abnormal; Notable for the following components:      Result Value   Chloride 95 (*)    CO2 34 (*)    Glucose, Bld 133 (*)    BUN 38 (*)    GFR, Estimated 58 (*)    All other components within normal limits  CBC WITH DIFFERENTIAL/PLATELET - Abnormal; Notable for the following components:   Neutro Abs 8.1 (*)    All other components within normal limits  CBC WITH DIFFERENTIAL/PLATELET    EKG None  Radiology DG Abdomen 1 View  Result Date: 01/12/2021 CLINICAL DATA:  Constipation, abdominal distention EXAM: ABDOMEN - 1 VIEW COMPARISON:  05/17/2015 FINDINGS: Moderate gaseous distension of the colon with moderate stool burden. Prior cholecystectomy. No organomegaly or free air. Visualized lung bases clear. IMPRESSION: Moderate gaseous distension and moderate stool burden throughout the colon. Electronically Signed   By: Rolm Baptise M.D.   On: 01/12/2021 17:22    Procedures Fecal disimpaction  Date/Time: 01/12/2021 11:08 PM Performed by: Sherwood Gambler, MD Authorized by: Sherwood Gambler, MD  Consent: Verbal consent obtained. Consent given by: patient Patient identity confirmed: verbally with patient Local anesthesia used: no  Anesthesia: Local anesthesia used: no  Sedation: Patient sedated: no  Patient tolerance: patient tolerated the procedure well with no immediate  complications Comments: Large amount of stool disimpacted. Tolerated without obvious complications. No gross blood.      Medications Ordered in ED Medications  oxyCODONE (Oxy IR/ROXICODONE) immediate release tablet 15 mg (has no administration in time range)  sodium chloride 0.9 % bolus 500 mL (0 mLs Intravenous Stopped 01/12/21 2106)  mineral oil enema 1 enema (1 enema Rectal Given 01/12/21 1826)    ED Course  I have reviewed the triage vital signs and the nursing notes.  Pertinent labs & imaging results that were available during my care of the patient were reviewed by me and considered in my medical decision making (see chart for details).    MDM Rules/Calculators/A&P                           Some soft stool but not really impacted at first. Given enema, and the stool came down but now is stuck. Then I was able to disimpact a sizable amount of stool. Now she is feeling better. No vomiting or abd pain. I don't think CT is needed. Otherwise, no acute complaints. Will d/c home with GI meds.  Final Clinical Impression(s) / ED Diagnoses Final diagnoses:  Fecal impaction in rectum [facility])    Rx / DC Orders ED Discharge Orders          Ordered    polyethylene glycol (MIRALAX / GLYCOLAX) 17 g packet  Daily        01/12/21 2044    docusate sodium (COLACE) 100 MG capsule  Every 12 hours        01/12/21 2044             Sherwood Gambler, MD 01/12/21 2309

## 2021-01-12 NOTE — ED Provider Notes (Signed)
Emergency Medicine Provider Triage Evaluation Note  [name] , a 87 y.o. female  was evaluated in triage.  Pt complains of constipation and abdominal distention.  Patient with history of small bowel obstruction.  Also history of hypothyroidism and hypocalcemia.  Last bowel movement 4 days ago  Review of Systems  Positive: Abdominal pain, constipation and distention Negative: Nausea vomiting  Physical Exam  BP (!) 145/68 (BP Location: Right Arm)   Pulse 72   Temp 98.1 F (36.7 C) (Oral)   Resp 18   Ht 5\' 1"  (1.549 m)   Wt 65.8 kg   SpO2 97%   BMI 27.40 kg/m  Gen:   Awake, no distress   Resp:  Normal effort  MSK:   Moves extremities without difficulty  Other:  Abdomen distended, left-sided hernia  Medical Decision Making  Medically screening exam initiated at 4:40 PM.  Appropriate orders placed.  [name] was informed that the remainder of the evaluation will be completed by another provider, this initial triage assessment does not replace that evaluation, and the importance of remaining in the ED until their evaluation is complete.     Katie Hammock, PA-C 01/12/21 [place], [place], DO 01/13/21 [id number]

## 2021-01-13 DIAGNOSIS — Z743 Need for continuous supervision: Secondary | ICD-10-CM | POA: Diagnosis not present

## 2021-01-13 DIAGNOSIS — K5900 Constipation, unspecified: Secondary | ICD-10-CM | POA: Diagnosis not present

## 2021-01-13 DIAGNOSIS — R4182 Altered mental status, unspecified: Secondary | ICD-10-CM | POA: Diagnosis not present

## 2021-01-13 NOTE — ED Notes (Signed)
Called pt's daughter and she is up waiting on them

## 2021-01-13 NOTE — ED Notes (Signed)
[facility]  here to pick pt up and take her home to her daughter

## 2021-01-13 NOTE — ED Notes (Signed)
[facility] was called several hours ago for transportation

## 2021-01-22 DIAGNOSIS — Z9981 Dependence on supplemental oxygen: Secondary | ICD-10-CM | POA: Diagnosis not present

## 2021-01-22 DIAGNOSIS — S82842D Displaced bimalleolar fracture of left lower leg, subsequent encounter for closed fracture with routine healing: Secondary | ICD-10-CM | POA: Diagnosis not present

## 2021-01-22 DIAGNOSIS — G620 Drug-induced polyneuropathy: Secondary | ICD-10-CM | POA: Diagnosis not present

## 2021-01-22 DIAGNOSIS — N179 Acute kidney failure, unspecified: Secondary | ICD-10-CM | POA: Diagnosis not present

## 2021-01-22 DIAGNOSIS — I428 Other cardiomyopathies: Secondary | ICD-10-CM | POA: Diagnosis not present

## 2021-01-22 DIAGNOSIS — E039 Hypothyroidism, unspecified: Secondary | ICD-10-CM | POA: Diagnosis not present

## 2021-01-22 DIAGNOSIS — Z950 Presence of cardiac pacemaker: Secondary | ICD-10-CM | POA: Diagnosis not present

## 2021-01-22 DIAGNOSIS — I10 Essential (primary) hypertension: Secondary | ICD-10-CM | POA: Diagnosis not present

## 2021-01-22 DIAGNOSIS — J449 Chronic obstructive pulmonary disease, unspecified: Secondary | ICD-10-CM | POA: Diagnosis not present

## 2021-01-22 DIAGNOSIS — Z8543 Personal history of malignant neoplasm of ovary: Secondary | ICD-10-CM | POA: Diagnosis not present

## 2021-01-22 DIAGNOSIS — J9611 Chronic respiratory failure with hypoxia: Secondary | ICD-10-CM | POA: Diagnosis not present

## 2021-01-22 DIAGNOSIS — T451X5D Adverse effect of antineoplastic and immunosuppressive drugs, subsequent encounter: Secondary | ICD-10-CM | POA: Diagnosis not present

## 2021-01-22 DIAGNOSIS — S82841D Displaced bimalleolar fracture of right lower leg, subsequent encounter for closed fracture with routine healing: Secondary | ICD-10-CM | POA: Diagnosis not present

## 2021-01-22 DIAGNOSIS — I4892 Unspecified atrial flutter: Secondary | ICD-10-CM | POA: Diagnosis not present

## 2021-01-22 DIAGNOSIS — G894 Chronic pain syndrome: Secondary | ICD-10-CM | POA: Diagnosis not present

## 2021-01-22 DIAGNOSIS — Z9181 History of falling: Secondary | ICD-10-CM | POA: Diagnosis not present

## 2021-02-27 DIAGNOSIS — M25572 Pain in left ankle and joints of left foot: Secondary | ICD-10-CM | POA: Diagnosis not present

## 2021-02-27 DIAGNOSIS — S82852S Displaced trimalleolar fracture of left lower leg, sequela: Secondary | ICD-10-CM | POA: Diagnosis not present

## 2021-02-27 DIAGNOSIS — M25571 Pain in right ankle and joints of right foot: Secondary | ICD-10-CM | POA: Diagnosis not present

## 2021-02-27 DIAGNOSIS — S82854S Nondisplaced trimalleolar fracture of right lower leg, sequela: Secondary | ICD-10-CM | POA: Diagnosis not present

## 2021-03-11 ENCOUNTER — Other Ambulatory Visit: Payer: Self-pay

## 2021-03-11 ENCOUNTER — Emergency Department (HOSPITAL_BASED_OUTPATIENT_CLINIC_OR_DEPARTMENT_OTHER)
Admission: EM | Admit: 2021-03-11 | Discharge: 2021-03-11 | Disposition: A | Discharge status: Home / Self Care | Payer: Medicare Other | Attending: Emergency Medicine | Admitting: Emergency Medicine

## 2021-03-11 ENCOUNTER — Encounter (HOSPITAL_BASED_OUTPATIENT_CLINIC_OR_DEPARTMENT_OTHER): Payer: Self-pay

## 2021-03-11 ENCOUNTER — Emergency Department (HOSPITAL_BASED_OUTPATIENT_CLINIC_OR_DEPARTMENT_OTHER): Payer: Medicare Other | Admitting: Radiology

## 2021-03-11 DIAGNOSIS — I110 Hypertensive heart disease with heart failure: Secondary | ICD-10-CM | POA: Diagnosis not present

## 2021-03-11 DIAGNOSIS — W19XXXA Unspecified fall, initial encounter: Secondary | ICD-10-CM | POA: Diagnosis not present

## 2021-03-11 DIAGNOSIS — Z23 Encounter for immunization: Secondary | ICD-10-CM | POA: Insufficient documentation

## 2021-03-11 DIAGNOSIS — M79606 Pain in leg, unspecified: Secondary | ICD-10-CM | POA: Diagnosis not present

## 2021-03-11 DIAGNOSIS — J441 Chronic obstructive pulmonary disease with (acute) exacerbation: Secondary | ICD-10-CM | POA: Diagnosis not present

## 2021-03-11 DIAGNOSIS — S81812A Laceration without foreign body, left lower leg, initial encounter: Secondary | ICD-10-CM | POA: Insufficient documentation

## 2021-03-11 DIAGNOSIS — Y92009 Unspecified place in unspecified non-institutional (private) residence as the place of occurrence of the external cause: Secondary | ICD-10-CM | POA: Insufficient documentation

## 2021-03-11 DIAGNOSIS — I5032 Chronic diastolic (congestive) heart failure: Secondary | ICD-10-CM | POA: Diagnosis not present

## 2021-03-11 DIAGNOSIS — E039 Hypothyroidism, unspecified: Secondary | ICD-10-CM | POA: Diagnosis not present

## 2021-03-11 DIAGNOSIS — I480 Paroxysmal atrial fibrillation: Secondary | ICD-10-CM | POA: Insufficient documentation

## 2021-03-11 DIAGNOSIS — R0902 Hypoxemia: Secondary | ICD-10-CM | POA: Diagnosis not present

## 2021-03-11 DIAGNOSIS — Z743 Need for continuous supervision: Secondary | ICD-10-CM | POA: Diagnosis not present

## 2021-03-11 DIAGNOSIS — Z7982 Long term (current) use of aspirin: Secondary | ICD-10-CM | POA: Diagnosis not present

## 2021-03-11 DIAGNOSIS — Z950 Presence of cardiac pacemaker: Secondary | ICD-10-CM | POA: Diagnosis not present

## 2021-03-11 DIAGNOSIS — Z8543 Personal history of malignant neoplasm of ovary: Secondary | ICD-10-CM | POA: Insufficient documentation

## 2021-03-11 DIAGNOSIS — S8992XA Unspecified injury of left lower leg, initial encounter: Secondary | ICD-10-CM | POA: Diagnosis not present

## 2021-03-11 DIAGNOSIS — Z79899 Other long term (current) drug therapy: Secondary | ICD-10-CM | POA: Insufficient documentation

## 2021-03-11 DIAGNOSIS — W010XXA Fall on same level from slipping, tripping and stumbling without subsequent striking against object, initial encounter: Secondary | ICD-10-CM | POA: Diagnosis not present

## 2021-03-11 MED ORDER — TETANUS-DIPHTH-ACELL PERTUSSIS 5-2.5-18.5 LF-MCG/0.5 IM SUSY
0.5000 mL | PREFILLED_SYRINGE | Freq: Once | INTRAMUSCULAR | Status: AC
  Administered 2021-03-11: 05:00:00 0.5 mL via INTRAMUSCULAR
  Filled 2021-03-11: qty 0.5

## 2021-03-11 NOTE — Discharge Instructions (Signed)
Change the dressing on your lower leg once a day.  Your x-rays suggest that there may be a new fracture in your left ankle.  Please follow-up with your orthopedic doctor to evaluate that.

## 2021-03-11 NOTE — ED Notes (Signed)
Pt resting well normal breathing pattern

## 2021-03-11 NOTE — ED Provider Notes (Signed)
[place] EMERGENCY DEPT Provider Note   CSN: [id number] Arrival date & time: 03/11/21  0354     History Chief Complaint  Patient presents with   [name] is a 87 y.o. female.  The history is provided by the patient.  Fall She has history of COPD, heart failure, AICD and comes in with injury of her left lower leg.  Triage note states she slipped trying to get back into bed, patient thinks she may have injured it while using her scooter but does not actually remember injury.  Last tetanus immunization is unknown.  She has chronic pain in both legs secondary to fractures of her ankles.  She is being followed by an orthopedic physician who is currently recommending no weightbearing although she is supposed to progress to weightbearing soon.   Past Medical History:  Diagnosis Date   AICD (automatic cardioverter/defibrillator) present 06/27/2007   medtronic   ARF (acute renal failure) ([place]) 09/2017   Cancer ([place])    ovarian--stage 4   Cardiomyopathy ([place])    CHF (congestive heart failure) ([place])    COPD (chronic obstructive pulmonary disease) ([place])    Dyspnea    Hypothyroidism    Peripheral neuropathy    Systemic hypertension    Thyroid disease     Patient Active Problem List   Diagnosis Date Noted   Frequent falls 05/17/2020   Macrocytosis 05/17/2020   Chronic pain syndrome 05/17/2020   Acute on chronic heart failure with preserved ejection fraction (HFpEF) ([place]) 02/11/2020   Essential hypertension 02/11/2020   Fracture of left ankle, closed, initial encounter 02/11/2020   Pacemaker 02/11/2020   Fall at home, initial encounter 02/11/2020   Prolonged QT interval 02/11/2020   ICD (implantable cardioverter-defibrillator) battery depletion    Pacemaker battery depletion 09/09/2019   Acute respiratory failure ([place]) 03/15/2019   COPD exacerbation ([place]) 03/15/2019   Acute respiratory failure with hypoxia ([place]) 03/14/2019   COPD with acute  exacerbation ([place]) 03/14/2019   Digoxin toxicity 10/04/2017   Closed fracture of distal end of radius 06/29/2017   Acute encephalopathy    AKI (acute kidney injury) ([place])    Dysarthria 09/29/2015   Abnormal liver function 09/29/2015   Acute renal failure (ARF) ([place]) 09/29/2015   Hyponatremia 09/29/2015   Small bowel obstruction ([place]) 05/17/2015   Hypothyroidism 05/17/2015   Uncontrolled hypertension 05/17/2015   Hypocalcemia 05/17/2015   Leukocytosis 05/17/2015   Paroxysmal atrial flutter ([place]) 04/24/2015   Incarcerated incisional hernia 03/28/2015   SBO (small bowel obstruction) ([place]) 03/28/2015   Nonischemic cardiomyopathy ([place]) 11/27/2012   Biventricular ICD  11/27/2012   Chronic diastolic heart failure ([place]) 11/27/2012   Peripheral neuropathy secondary to chemotherapy 11/27/2012   Hyperlipidemia 11/27/2012   COPD (chronic obstructive pulmonary disease) ([place]) 12/03/2011   DOE (dyspnea on exertion) 11/05/2011    Past Surgical History:  Procedure Laterality Date   ABDOMINAL HYSTERECTOMY  oct 2000   along with ovarian ca surg   APPENDECTOMY     BIV PACEMAKER GENERATOR CHANGEOUT N/A 09/10/2019   Procedure: [place];  Surgeon: Sanda Klein, MD;  Location: [facility];  Service: Cardiovascular;  Laterality: N/A;   CARDIAC CATHETERIZATION  09/27/1990   normal coronaries,dilated cardiomyopathy   CARDIAC DEFIBRILLATOR PLACEMENT  06/27/2007   medtronic concerto   CHOLECYSTECTOMY     IMPLANTABLE CARDIOVERTER DEFIBRILLATOR GENERATOR CHANGE N/A 07/31/2013   Procedure: IMPLANTABLE CARDIOVERTER DEFIBRILLATOR GENERATOR CHANGE;  Surgeon: Sanda Klein, MD;  Location: [facility]ATH LAB;  Service: Cardiovascular;  Laterality: N/A;   NM MYOCAR PERF WALL MOTION  06/05/2007   no ischemia     OB History     Gravida  2   Para      Term      Preterm      AB      Living  2      SAB      IAB      Ectopic      Multiple      Live Births               Family History  Problem Relation Age of Onset   Lung cancer Mother        died from cancer   Heart attack Father    Lung cancer Father    Heart disease Paternal Grandmother     Social History   Tobacco Use   Smoking status: Never   Smokeless tobacco: Never  Vaping Use   Vaping Use: Never used  Substance Use Topics   Alcohol use: No   Drug use: No    Home Medications Prior to Admission medications   Medication Sig Start Date End Date Taking? Authorizing Provider  aspirin EC 81 MG tablet Take 1 tablet (81 mg total) by mouth daily. Patient taking differently: Take 81 mg by mouth daily as needed (bloodthinner). 10/03/17   Croitoru, Mihai, MD  bisoprolol (ZEBETA) 10 MG tablet Take 1 tablet (10 mg total) by mouth daily. Patient not taking: No sig reported 08/04/20   Croitoru, Mihai, MD  carvedilol (COREG) 12.5 MG tablet Take 12.5 mg by mouth in the morning and at bedtime.    [provider]  Cholecalciferol (VITAMIN D3 PO) Take 2 tablets by mouth daily.    [provider]  docusate sodium (COLACE) 100 MG capsule Take 1 capsule (100 mg total) by mouth every 12 (twelve) hours. 01/12/21   Sherwood Gambler, MD  dorzolamide-timolol (COSOPT) 22.3-6.8 MG/ML ophthalmic solution Place 1 drop into both eyes 2 (two) times daily. 02/18/20   Shelly Coss, MD  DULERA 100-5 MCG/ACT AERO Inhale 2 puffs into the lungs every 6 (six) hours as needed for wheezing or shortness of breath.  08/29/19   [provider]  DULoxetine (CYMBALTA) 60 MG capsule Take 60 mg by mouth daily.  08/25/15   [provider]  furosemide (LASIX) 20 MG tablet Take 40 mg (two tablets) in the morning and 20 mg in the evening. 08/04/20   Croitoru, Mihai, MD  furosemide (LASIX) 40 MG tablet TAKE 1 TABLET BY MOUTH EVERY DAY Patient not taking: No sig reported 08/27/20   Croitoru, Mihai, MD  gabapentin (NEURONTIN) 100 MG capsule Take 100 mg by mouth 3 (three) times daily.    [provider]   latanoprost (XALATAN) 0.005 % ophthalmic solution Place 1 drop into both eyes at bedtime. 02/18/20   Shelly Coss, MD  liothyronine (CYTOMEL) 25 MCG tablet Take 25 mcg by mouth daily. 07/02/15   [provider]  oxyCODONE (ROXICODONE) 15 MG immediate release tablet Take 1 tablet (15 mg total) by mouth every 6 (six) hours as needed for pain. Patient taking differently: Take 15 mg by mouth in the morning, at noon, in the evening, and at bedtime. Takes at 0300 0900 1500 2100 02/17/20   Shelly Coss, MD  polyethylene glycol (MIRALAX / GLYCOLAX) 17 g packet Take 17 g by mouth daily. 01/12/21   Sherwood Gambler, MD  Prenatal Vit-Fe Fumarate-FA (PRENATAL VITAMINS  PO) Take 1 tablet by mouth daily.    [provider]  SYNTHROID 112 MCG tablet Take 112 mcg by mouth daily before breakfast.  09/12/15   [provider]  traZODone (DESYREL) 50 MG tablet Take 50 mg by mouth at bedtime as needed for sleep.  01/10/20   [provider]  Zinc Oxide 10 % OINT Apply 1 application topically 4 (four) times daily as needed (bed sores).    [provider]    Allergies    Digitalis  Review of Systems   Review of Systems  All other systems reviewed and are negative.  Physical Exam Updated Vital Signs BP (!) 139/56 (BP Location: Right Arm)    Pulse 80    Temp 98.2 F (36.8 C) (Oral)    Resp 16    Ht 5\' 2"  (1.575 m)    Wt 65.8 kg    SpO2 100%    BMI 26.52 kg/m   Physical Exam Vitals and nursing note reviewed.  87 year old female, resting comfortably and in no acute distress. Vital signs are normal. Oxygen saturation is 100%, which is normal. Head is normocephalic and atraumatic. PERRLA, EOMI. Oropharynx is clear. Neck is nontender and supple without adenopathy or JVD. Back is nontender and there is no CVA tenderness. Lungs are clear without rales, wheezes, or rhonchi. Chest is nontender. Heart has regular rate and rhythm without murmur. Abdomen is soft, flat,  nontender. Extremities: Skin tear present anterior aspect of left lower leg with slight soft tissue swelling.  Chronic deformities of both ankles present.  Full range of motion present of bilateral hips, knees.  Markedly restricted range of motion both ankles.  Neurovascular exam is intact with prompt capillary refill, normal sensation, adequate dorsalis pedis pulses. Skin is warm and dry without rash. Neurologic: Mental status is normal, cranial nerves are intact, moves all extremities equally.  ED Results / Procedures / Treatments    Radiology DG Tibia/Fibula Left  Result Date: 03/11/2021 CLINICAL DATA:  Fall injury. EXAM: LEFT TIBIA AND FIBULA - 2 VIEW COMPARISON:  Study of 02/11/2020 FINDINGS: There is generalized edema in the foreleg. This was seen previously. Generalized osteopenia. On the third image, 1 of 3 lateral views demonstrates suspicion of a nondisplaced oblique fracture of the posterior malleolus. Medial and lateral malleolar fractures noted acutely on the prior study are healed in the interval. There is no further evidence of fractures. There is degenerative arthrosis at the knee and ankle and a small plantar calcaneal spur. There are calcifications of the popliteal trifurcation arteries. The edema continues into the ankle and foot. IMPRESSION: Osteopenia and suspected nondisplaced intra-articular oblique fracture of the posterior malleolus, apparently through an old injury with interval healing of lateral and medial malleolar fractures on the prior study. Generalized edema. Vascular calcification. Electronically Signed   By: [name] M.D.   On: 03/11/2021 05:20    Procedures Procedures   Medications Ordered in ED Medications  Tdap (BOOSTRIX) injection 0.5 mL (has no administration in time range)    ED Course  I have reviewed the triage vital signs and the nursing notes.  Pertinent imaging results that were available during my care of the patient were reviewed by me and  considered in my medical decision making (see chart for details).   MDM Rules/Calculators/A&P                         Fall at home with skin tear of  left lower leg.  This is not amenable to primary closure because of fragility of the skin.  She is sent for x-rays to exclude underlying fracture and there is no fracture underlying the skin tear, but there is a question of a new fracture of the posterior malleolus.  I have come back in to reevaluate the patient and she has tenderness to palpation diffusely through both ankles, no clear area of point tenderness.  She is referred back to her orthopedic physician for further evaluation of this possible new fracture.  Tdap booster is given.  Old records reviewed showing no ED visits for falls, office visits with orthopedics for trimalleolar fractures of both ankles.  Final Clinical Impression(s) / ED Diagnoses Final diagnoses:  Fall at home, initial encounter  Skin tear of left lower leg without complication, initial encounter    Rx / DC Orders ED Discharge Orders     None        Delora Fuel, MD 96/28/36 [phone]

## 2021-03-11 NOTE — ED Triage Notes (Signed)
Mechanical fall at home; patient slipped trying to get back into bed.  She now has a laceration to left shin.

## 2021-05-06 ENCOUNTER — Ambulatory Visit (INDEPENDENT_AMBULATORY_CARE_PROVIDER_SITE_OTHER): Payer: Medicare Other

## 2021-05-06 DIAGNOSIS — I428 Other cardiomyopathies: Secondary | ICD-10-CM | POA: Diagnosis not present

## 2021-05-06 LAB — CUP PACEART REMOTE DEVICE CHECK
Battery Remaining Longevity: 112 mo
Battery Voltage: 3.02 V
Brady Statistic AP VP Percent: 2.33 %
Brady Statistic AP VS Percent: 0.06 %
Brady Statistic AS VP Percent: 95.03 %
Brady Statistic AS VS Percent: 2.58 %
Brady Statistic RA Percent Paced: 2.39 %
Brady Statistic RV Percent Paced: 1.26 %
Date Time Interrogation Session: 20230208051541
Implantable Lead Implant Date: 20090331
Implantable Lead Implant Date: 20090331
Implantable Lead Implant Date: 20090401
Implantable Lead Location: 753858
Implantable Lead Location: 753859
Implantable Lead Location: 753860
Implantable Lead Model: 4194
Implantable Lead Model: 5076
Implantable Lead Model: 6947
Implantable Pulse Generator Implant Date: 20210614
Lead Channel Impedance Value: 323 Ohm
Lead Channel Impedance Value: 380 Ohm
Lead Channel Impedance Value: 399 Ohm
Lead Channel Impedance Value: 475 Ohm
Lead Channel Impedance Value: 494 Ohm
Lead Channel Impedance Value: 589 Ohm
Lead Channel Impedance Value: 608 Ohm
Lead Channel Impedance Value: 608 Ohm
Lead Channel Impedance Value: 836 Ohm
Lead Channel Pacing Threshold Amplitude: 1 V
Lead Channel Pacing Threshold Amplitude: 1.375 V
Lead Channel Pacing Threshold Amplitude: 1.375 V
Lead Channel Pacing Threshold Pulse Width: 0.4 ms
Lead Channel Pacing Threshold Pulse Width: 0.4 ms
Lead Channel Pacing Threshold Pulse Width: 0.8 ms
Lead Channel Sensing Intrinsic Amplitude: 16.875 mV
Lead Channel Sensing Intrinsic Amplitude: 16.875 mV
Lead Channel Sensing Intrinsic Amplitude: 3.875 mV
Lead Channel Sensing Intrinsic Amplitude: 3.875 mV
Lead Channel Setting Pacing Amplitude: 2 V
Lead Channel Setting Pacing Amplitude: 2 V
Lead Channel Setting Pacing Amplitude: 2.5 V
Lead Channel Setting Pacing Pulse Width: 0.8 ms
Lead Channel Setting Pacing Pulse Width: 0.8 ms
Lead Channel Setting Sensing Sensitivity: 1.2 mV

## 2021-05-11 NOTE — Progress Notes (Signed)
Remote pacemaker transmission.   

## 2021-06-03 DIAGNOSIS — J449 Chronic obstructive pulmonary disease, unspecified: Secondary | ICD-10-CM | POA: Diagnosis not present

## 2021-06-03 DIAGNOSIS — G894 Chronic pain syndrome: Secondary | ICD-10-CM | POA: Diagnosis not present

## 2021-06-03 DIAGNOSIS — Z9981 Dependence on supplemental oxygen: Secondary | ICD-10-CM | POA: Diagnosis not present

## 2021-06-03 DIAGNOSIS — Z741 Need for assistance with personal care: Secondary | ICD-10-CM | POA: Diagnosis not present

## 2021-06-03 DIAGNOSIS — I5042 Chronic combined systolic (congestive) and diastolic (congestive) heart failure: Secondary | ICD-10-CM | POA: Diagnosis not present

## 2021-06-03 DIAGNOSIS — Z7401 Bed confinement status: Secondary | ICD-10-CM | POA: Diagnosis not present

## 2021-06-03 DIAGNOSIS — F0280 Dementia in other diseases classified elsewhere without behavioral disturbance: Secondary | ICD-10-CM | POA: Diagnosis not present

## 2021-06-19 DIAGNOSIS — I5032 Chronic diastolic (congestive) heart failure: Secondary | ICD-10-CM | POA: Diagnosis not present

## 2021-06-19 DIAGNOSIS — F0280 Dementia in other diseases classified elsewhere without behavioral disturbance: Secondary | ICD-10-CM | POA: Diagnosis not present

## 2021-06-19 DIAGNOSIS — R0602 Shortness of breath: Secondary | ICD-10-CM | POA: Diagnosis not present

## 2021-06-19 DIAGNOSIS — J449 Chronic obstructive pulmonary disease, unspecified: Secondary | ICD-10-CM | POA: Diagnosis not present

## 2021-06-24 ENCOUNTER — Telehealth: Payer: Self-pay | Admitting: Cardiovascular Disease

## 2021-06-24 MED ORDER — CARVEDILOL 12.5 MG PO TABS: 12.5000 mg | ORAL_TABLET | Freq: Two times a day (BID) | ORAL | 3 refills | Status: DC

## 2021-06-24 NOTE — Telephone Encounter (Signed)
Spoke with pt daughter, telephone visit scheduled for 07/29/21 and refill sent to the pharmacy. ?

## 2021-06-24 NOTE — Telephone Encounter (Signed)
Pt's daughter called to get refill on Coreg but pt hasn't been seen since 08/04/2020. Daughter states that pt is unable to come in for OV do to her being bed written and is requesting a virtual appt. ?

## 2021-07-29 ENCOUNTER — Telehealth (INDEPENDENT_AMBULATORY_CARE_PROVIDER_SITE_OTHER): Payer: Medicare Other | Admitting: Cardiovascular Disease

## 2021-07-29 ENCOUNTER — Encounter: Payer: Self-pay | Admitting: Cardiovascular Disease

## 2021-07-29 VITALS — Ht 63.0 in

## 2021-07-29 DIAGNOSIS — I5032 Chronic diastolic (congestive) heart failure: Secondary | ICD-10-CM | POA: Diagnosis not present

## 2021-07-29 DIAGNOSIS — I428 Other cardiomyopathies: Secondary | ICD-10-CM | POA: Diagnosis not present

## 2021-07-29 DIAGNOSIS — Z950 Presence of cardiac pacemaker: Secondary | ICD-10-CM

## 2021-07-29 DIAGNOSIS — Z8679 Personal history of other diseases of the circulatory system: Secondary | ICD-10-CM | POA: Diagnosis not present

## 2021-07-29 DIAGNOSIS — J449 Chronic obstructive pulmonary disease, unspecified: Secondary | ICD-10-CM | POA: Diagnosis not present

## 2021-07-29 NOTE — Patient Instructions (Signed)
Medication Instructions:  No changes *If you need a refill on your cardiac medications before your next appointment, please call your pharmacy*   Lab Work: None ordered If you have labs (blood work) drawn today and your tests are completely normal, you will receive your results only by: MyChart Message (if you have MyChart) OR A paper copy in the mail If you have any lab test that is abnormal or we need to change your treatment, we will call you to review the results.   Testing/Procedures: None ordered   Follow-Up: At [facility], you and your health needs are our priority.  As part of our continuing mission to provide you with exceptional heart care, we have created designated Provider Care Teams.  These Care Teams include your primary Cardiologist (physician) and Advanced Practice Providers (APPs -  Physician Assistants and Nurse Practitioners) who all work together to provide you with the care you need, when you need it.  We recommend signing up for the patient portal called "MyChart".  Sign up information is provided on this After Visit Summary.  MyChart is used to connect with patients for Virtual Visits (Telemedicine).  Patients are able to view lab/test results, encounter notes, upcoming appointments, etc.  Non-urgent messages can be sent to your provider as well.   To learn more about what you can do with MyChart, go to [email or web address].    Your next appointment:   12 month(s)  The format for your next appointment:   In Person  Provider:   Dr. Croitoru  Important Information About Sugar       

## 2021-07-29 NOTE — Progress Notes (Signed)
Patient ID: [name], female   DOB: January 28, 1929, 88 y.o.   MRN: [id number] ?  ? ?Cardiology TELEMEDICINE Note   ? ?  ? ?Virtual Visit via Phone Note  ? ?This visit type was conducted due to national recommendations for restrictions regarding the COVID-19 Pandemic (e.g. social distancing) in an effort to limit this patient's exposure and mitigate transmission in our community.  Due to her co-morbid illnesses, this patient is at least at moderate risk for complications without adequate follow up.  This format is felt to be most appropriate for this patient at this time.  All issues noted in this document were discussed and addressed.  A limited physical exam was performed with this format.  Please refer to the patient's chart for her consent to telehealth for [facility]. ? ? ?Date:  07/29/2021  ? ?ID:  [name], DOB 05/16/1928, MRN [id number] ?The patient was identified using 2 identifiers. ? ?Patient Location: Home ?Provider Location: Office/Clinic ? ? ?PCP:  Carolee Rota, NP ?  ?[facility] Providers ?Cardiologist:  Virl Axe, MD ?Electrophysiologist:  Sanda Klein, MD  ?   ? ? ?Chief Complaint  ?Patient presents with  ? Congestive Heart Failure  ? ? ?History of Present Illness:  ?[name] is a 88 y.o. female with CHF due to nonischemic cardiomyopathy, CRT hyperresponder , phone visit today for CHF follow-up. ? ?Lamisha is bedridden and has worsening memory problems.  Phone interview was performed with both Soniya and her daughter Arrie Aran. [name] his taking care of administering Mykaila's medication. ? ?She is in bed all day long.  She has a hospital bed and spends most of the day sitting upright, but has not been in a wheelchair. ? ?Had repeated falls.  Was hospitalized in November 2021 with a left ankle and right calcaneal fractures.  She had a visit to the emergency room and she was hospitalized for a week in February 9450 with metabolic encephalopathy and severe constipation requiring disimpaction.  She  did not have any problems with heart failure during that admission and and in fact improved with IV fluids.  She has had an extremely slow recovery.  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  She no longer uses a motorized wheelchair around the house.  She is not receiving physical and occupational therapy and does not want to do the recommended daily exercises.  She is on oxygen at 2 L most of the day.  On the current dose of furosemide she does not have any problems with dyspnea or edema.  She has not had palpitations, syncope, chest pain or new focal neurological complaints.  She is unable to stand on a scale to be weighed. ? ?Her most recent device download showed normal functioning generator and leads.  Presenting rhythm is atrial sensed biventricular paced (biventricular pacing efficiency greater than 97%.  She has not had any episodes of atrial fibrillation or ventricular tachycardia.  At the time of the last download OptiVol shows normal fluid levels. ? ?Her initial CRT-D-D device was implanted in 2009, generator change out in 2015, change out again in 2021 with a downgrade to a CRT-P Medtronic Percepta device. ? ?Her most recent echocardiogram performed in November 2021, roughly 4 months after her last device change out, showed unremarkable LVEF of 70-75%.  There was mild elevation in systolic PAP at 42 mmHg.  The left atrium was severely dilated.  There was a report of mild  mitral stenosis due to mitral and calcification (mean gradient 5 mmHg at 73 bpm). ? ?Reported left ventricular ejection fraction as low as 20% in the past. She had normal coronary arteries by catheterization in 1992. After implementation of resynchronization pacemaker therapy in 2009 her ejection fraction has returned to normal.  Last echo June 2017 showed EF 65-70%.  She had a Nature conservation officer in May 2015 (Medtronic Viva XT).  She then had a generator change I would  downgrade to CRT-P in July 2021 with a Medtronic Percepta device she is not pacemaker dependent. She has COPD with chronic dyspnea, hyperlipidemia and a history of neuropathy secondary to chemotherapy. ? ?Past Medical History:  ?Diagnosis Date  ? AICD (automatic cardioverter/defibrillator) present 06/27/2007  ? medtronic  ? ARF (acute renal failure) ([place]) 09/2017  ? Cancer [facility])   ? ovarian--stage 4  ? Cardiomyopathy ([place])   ? CHF (congestive heart failure) ([place])   ? COPD (chronic obstructive pulmonary disease) ([place])   ? Dyspnea   ? Hypothyroidism   ? Peripheral neuropathy   ? Systemic hypertension   ? Thyroid disease   ? ? ?Past Surgical History:  ?Procedure Laterality Date  ? ABDOMINAL HYSTERECTOMY  oct 2000  ? along with ovarian ca surg  ? APPENDECTOMY    ? [place] N/A 09/10/2019  ? Procedure: [place];  Surgeon: Sanda Klein, MD;  Location: [facility];  Service: Cardiovascular;  Laterality: N/A;  ? CARDIAC CATHETERIZATION  09/27/1990  ? normal coronaries,dilated cardiomyopathy  ? CARDIAC DEFIBRILLATOR PLACEMENT  06/27/2007  ? medtronic concerto  ? CHOLECYSTECTOMY    ? IMPLANTABLE CARDIOVERTER DEFIBRILLATOR GENERATOR CHANGE N/A 07/31/2013  ? Procedure: IMPLANTABLE CARDIOVERTER DEFIBRILLATOR GENERATOR CHANGE;  Surgeon: Sanda Klein, MD;  [facility];  Service: Cardiovascular;  Laterality: N/A;  ? NM MYOCAR PERF WALL MOTION  06/05/2007  ? no ischemia  ? ? ?Outpatient Medications Prior to Visit  ?Medication Sig Dispense Refill  ? aspirin EC 81 MG tablet Take 1 tablet (81 mg total) by mouth daily. (Patient taking differently: Take 81 mg by mouth daily as needed (bloodthinner).) 90 tablet 3  ? carvedilol (COREG) 12.5 MG tablet Take 1 tablet (12.5 mg total) by mouth in the morning and at bedtime. 180 tablet 3  ? Cholecalciferol (VITAMIN D3 PO) Take 2 tablets by mouth daily.    ? docusate sodium (COLACE) 100 MG capsule Take 1 capsule (100 mg total) by  mouth every 12 (twelve) hours. 60 capsule 0  ? dorzolamide-timolol (COSOPT) 22.3-6.8 MG/ML ophthalmic solution Place 1 drop into both eyes 2 (two) times daily. 10 mL 12  ? DULERA 100-5 MCG/ACT AERO Inhale 2 puffs into the lungs every 6 (six) hours as needed for wheezing or shortness of breath.     ? DULoxetine (CYMBALTA) 60 MG capsule Take 60 mg by mouth daily.   2  ? furosemide (LASIX) 20 MG tablet Take 40 mg (two tablets) in the morning and 20 mg in the evening. 90 tablet 11  ? furosemide (LASIX) 40 MG tablet TAKE 1 TABLET BY MOUTH EVERY DAY 90 tablet 3  ? gabapentin (NEURONTIN) 100 MG capsule Take 100 mg by mouth 3 (three) times daily.    ? latanoprost (XALATAN) 0.005 % ophthalmic solution Place 1 drop into both eyes at bedtime. 2.5 mL 12  ? liothyronine (CYTOMEL) 25 MCG tablet Take 25 mcg by mouth daily.    ? oxyCODONE (ROXICODONE) 15 MG immediate release tablet Take 1 tablet (  15 mg total) by mouth every 6 (six) hours as needed for pain. (Patient taking differently: Take 15 mg by mouth in the morning, at noon, in the evening, and at bedtime. Takes at 0300 0900 1500 2100) 15 tablet 0  ? polyethylene glycol (MIRALAX / GLYCOLAX) 17 g packet Take 17 g by mouth daily. 14 each 0  ? Prenatal Vit-Fe Fumarate-FA (PRENATAL VITAMINS PO) Take 1 tablet by mouth daily.    ? SYNTHROID 112 MCG tablet Take 112 mcg by mouth daily before breakfast.     ? traZODone (DESYREL) 50 MG tablet Take 50 mg by mouth at bedtime as needed for sleep.     ? Zinc Oxide 10 % OINT Apply 1 application topically 4 (four) times daily as needed (bed sores).    ? bisoprolol (ZEBETA) 10 MG tablet Take 1 tablet (10 mg total) by mouth daily. 30 tablet 11  ? ?No facility-administered medications prior to visit.  ?  ? ?Allergies:   Digitalis  ? ?Social History  ? ?Socioeconomic History  ? Marital status: Widowed  ?  Spouse name: Not on file  ? Number of children: 2  ? Years of education: Not on file  ? Highest education level: Not on file  ?Occupational  History  ? Occupation: housewife  ?Tobacco Use  ? Smoking status: Never  ? Smokeless tobacco: Never  ?Vaping Use  ? Vaping Use: Never used  ?Substance and Sexual Activity  ? Alcohol use: No  ? Drug use: No  ?

## 2021-08-05 ENCOUNTER — Ambulatory Visit (INDEPENDENT_AMBULATORY_CARE_PROVIDER_SITE_OTHER): Payer: Medicare Other

## 2021-08-05 DIAGNOSIS — I5032 Chronic diastolic (congestive) heart failure: Secondary | ICD-10-CM

## 2021-08-05 DIAGNOSIS — I428 Other cardiomyopathies: Secondary | ICD-10-CM

## 2021-08-06 LAB — CUP PACEART REMOTE DEVICE CHECK
Battery Remaining Longevity: 109 mo
Battery Voltage: 3.02 V
Brady Statistic AP VP Percent: 1.17 %
Brady Statistic AP VS Percent: 0.03 %
Brady Statistic AS VP Percent: 95.87 %
Brady Statistic AS VS Percent: 2.92 %
Brady Statistic RA Percent Paced: 1.21 %
Brady Statistic RV Percent Paced: 0.79 %
Date Time Interrogation Session: 20230510030820
Implantable Lead Implant Date: 20090331
Implantable Lead Implant Date: 20090331
Implantable Lead Implant Date: 20090401
Implantable Lead Location: 753858
Implantable Lead Location: 753859
Implantable Lead Location: 753860
Implantable Lead Model: 4194
Implantable Lead Model: 5076
Implantable Lead Model: 6947
Implantable Pulse Generator Implant Date: 20210614
Lead Channel Impedance Value: 304 Ohm
Lead Channel Impedance Value: 361 Ohm
Lead Channel Impedance Value: 380 Ohm
Lead Channel Impedance Value: 494 Ohm
Lead Channel Impedance Value: 494 Ohm
Lead Channel Impedance Value: 551 Ohm
Lead Channel Impedance Value: 608 Ohm
Lead Channel Impedance Value: 627 Ohm
Lead Channel Impedance Value: 855 Ohm
Lead Channel Pacing Threshold Amplitude: 0.75 V
Lead Channel Pacing Threshold Amplitude: 1.5 V
Lead Channel Pacing Threshold Amplitude: 1.625 V
Lead Channel Pacing Threshold Pulse Width: 0.4 ms
Lead Channel Pacing Threshold Pulse Width: 0.4 ms
Lead Channel Pacing Threshold Pulse Width: 0.8 ms
Lead Channel Sensing Intrinsic Amplitude: 15.625 mV
Lead Channel Sensing Intrinsic Amplitude: 15.625 mV
Lead Channel Sensing Intrinsic Amplitude: 3.875 mV
Lead Channel Sensing Intrinsic Amplitude: 3.875 mV
Lead Channel Setting Pacing Amplitude: 1.75 V
Lead Channel Setting Pacing Amplitude: 2 V
Lead Channel Setting Pacing Amplitude: 2.5 V
Lead Channel Setting Pacing Pulse Width: 0.8 ms
Lead Channel Setting Pacing Pulse Width: 0.8 ms
Lead Channel Setting Sensing Sensitivity: 1.2 mV

## 2021-08-17 NOTE — Progress Notes (Signed)
Remote pacemaker transmission.   

## 2021-08-28 DIAGNOSIS — Z9981 Dependence on supplemental oxygen: Secondary | ICD-10-CM | POA: Diagnosis not present

## 2021-08-28 DIAGNOSIS — F331 Major depressive disorder, recurrent, moderate: Secondary | ICD-10-CM | POA: Diagnosis not present

## 2021-08-28 DIAGNOSIS — Z741 Need for assistance with personal care: Secondary | ICD-10-CM | POA: Diagnosis not present

## 2021-08-28 DIAGNOSIS — G622 Polyneuropathy due to other toxic agents: Secondary | ICD-10-CM | POA: Diagnosis not present

## 2021-08-28 DIAGNOSIS — I10 Essential (primary) hypertension: Secondary | ICD-10-CM | POA: Diagnosis not present

## 2021-08-28 DIAGNOSIS — E039 Hypothyroidism, unspecified: Secondary | ICD-10-CM | POA: Diagnosis not present

## 2021-08-28 DIAGNOSIS — J449 Chronic obstructive pulmonary disease, unspecified: Secondary | ICD-10-CM | POA: Diagnosis not present

## 2021-08-28 DIAGNOSIS — M159 Polyosteoarthritis, unspecified: Secondary | ICD-10-CM | POA: Diagnosis not present

## 2021-08-28 DIAGNOSIS — I5042 Chronic combined systolic (congestive) and diastolic (congestive) heart failure: Secondary | ICD-10-CM | POA: Diagnosis not present

## 2021-08-28 DIAGNOSIS — I700 Atherosclerosis of aorta: Secondary | ICD-10-CM | POA: Diagnosis not present

## 2021-08-28 DIAGNOSIS — F0280 Dementia in other diseases classified elsewhere without behavioral disturbance: Secondary | ICD-10-CM | POA: Diagnosis not present

## 2021-08-28 DIAGNOSIS — G894 Chronic pain syndrome: Secondary | ICD-10-CM | POA: Diagnosis not present

## 2021-10-03 ENCOUNTER — Emergency Department (HOSPITAL_COMMUNITY): Payer: Medicare Other

## 2021-10-03 ENCOUNTER — Other Ambulatory Visit: Payer: Self-pay

## 2021-10-03 ENCOUNTER — Inpatient Hospital Stay (HOSPITAL_COMMUNITY)
Admission: EM | Admit: 2021-10-03 | Discharge: 2021-10-06 | DRG: 689 | Disposition: A | Discharge status: Home / Self Care | Payer: Medicare Other | Attending: Internal Medicine | Admitting: Internal Medicine

## 2021-10-03 ENCOUNTER — Encounter (HOSPITAL_COMMUNITY): Payer: Self-pay | Admitting: Emergency Medicine

## 2021-10-03 DIAGNOSIS — Z7982 Long term (current) use of aspirin: Secondary | ICD-10-CM | POA: Diagnosis not present

## 2021-10-03 DIAGNOSIS — N390 Urinary tract infection, site not specified: Secondary | ICD-10-CM | POA: Diagnosis present

## 2021-10-03 DIAGNOSIS — Z95810 Presence of automatic (implantable) cardiac defibrillator: Secondary | ICD-10-CM

## 2021-10-03 DIAGNOSIS — R0602 Shortness of breath: Secondary | ICD-10-CM | POA: Diagnosis not present

## 2021-10-03 DIAGNOSIS — Z888 Allergy status to other drugs, medicaments and biological substances status: Secondary | ICD-10-CM

## 2021-10-03 DIAGNOSIS — G894 Chronic pain syndrome: Secondary | ICD-10-CM | POA: Diagnosis present

## 2021-10-03 DIAGNOSIS — Z79899 Other long term (current) drug therapy: Secondary | ICD-10-CM | POA: Diagnosis not present

## 2021-10-03 DIAGNOSIS — T451X5A Adverse effect of antineoplastic and immunosuppressive drugs, initial encounter: Secondary | ICD-10-CM | POA: Diagnosis present

## 2021-10-03 DIAGNOSIS — R1084 Generalized abdominal pain: Secondary | ICD-10-CM | POA: Diagnosis not present

## 2021-10-03 DIAGNOSIS — F0393 Unspecified dementia, unspecified severity, with mood disturbance: Secondary | ICD-10-CM | POA: Diagnosis present

## 2021-10-03 DIAGNOSIS — R112 Nausea with vomiting, unspecified: Secondary | ICD-10-CM | POA: Diagnosis present

## 2021-10-03 DIAGNOSIS — Z20822 Contact with and (suspected) exposure to covid-19: Secondary | ICD-10-CM | POA: Diagnosis present

## 2021-10-03 DIAGNOSIS — I110 Hypertensive heart disease with heart failure: Secondary | ICD-10-CM | POA: Diagnosis present

## 2021-10-03 DIAGNOSIS — I10 Essential (primary) hypertension: Secondary | ICD-10-CM | POA: Diagnosis present

## 2021-10-03 DIAGNOSIS — Z8041 Family history of malignant neoplasm of ovary: Secondary | ICD-10-CM | POA: Diagnosis not present

## 2021-10-03 DIAGNOSIS — G620 Drug-induced polyneuropathy: Secondary | ICD-10-CM | POA: Diagnosis present

## 2021-10-03 DIAGNOSIS — J90 Pleural effusion, not elsewhere classified: Secondary | ICD-10-CM | POA: Diagnosis not present

## 2021-10-03 DIAGNOSIS — E039 Hypothyroidism, unspecified: Secondary | ICD-10-CM | POA: Diagnosis present

## 2021-10-03 DIAGNOSIS — B9620 Unspecified Escherichia coli [E. coli] as the cause of diseases classified elsewhere: Secondary | ICD-10-CM | POA: Diagnosis present

## 2021-10-03 DIAGNOSIS — Z66 Do not resuscitate: Secondary | ICD-10-CM | POA: Diagnosis present

## 2021-10-03 DIAGNOSIS — Z801 Family history of malignant neoplasm of trachea, bronchus and lung: Secondary | ICD-10-CM | POA: Diagnosis not present

## 2021-10-03 DIAGNOSIS — J440 Chronic obstructive pulmonary disease with acute lower respiratory infection: Secondary | ICD-10-CM | POA: Diagnosis present

## 2021-10-03 DIAGNOSIS — E785 Hyperlipidemia, unspecified: Secondary | ICD-10-CM | POA: Diagnosis present

## 2021-10-03 DIAGNOSIS — K3189 Other diseases of stomach and duodenum: Secondary | ICD-10-CM | POA: Diagnosis not present

## 2021-10-03 DIAGNOSIS — J449 Chronic obstructive pulmonary disease, unspecified: Secondary | ICD-10-CM | POA: Diagnosis present

## 2021-10-03 DIAGNOSIS — J9611 Chronic respiratory failure with hypoxia: Secondary | ICD-10-CM | POA: Diagnosis present

## 2021-10-03 DIAGNOSIS — N261 Atrophy of kidney (terminal): Secondary | ICD-10-CM | POA: Diagnosis not present

## 2021-10-03 DIAGNOSIS — R1111 Vomiting without nausea: Secondary | ICD-10-CM | POA: Diagnosis not present

## 2021-10-03 DIAGNOSIS — I5032 Chronic diastolic (congestive) heart failure: Secondary | ICD-10-CM | POA: Diagnosis present

## 2021-10-03 DIAGNOSIS — R110 Nausea: Secondary | ICD-10-CM | POA: Diagnosis not present

## 2021-10-03 DIAGNOSIS — R0609 Other forms of dyspnea: Secondary | ICD-10-CM | POA: Diagnosis present

## 2021-10-03 DIAGNOSIS — I428 Other cardiomyopathies: Secondary | ICD-10-CM | POA: Diagnosis present

## 2021-10-03 DIAGNOSIS — J189 Pneumonia, unspecified organism: Secondary | ICD-10-CM | POA: Diagnosis present

## 2021-10-03 DIAGNOSIS — G629 Polyneuropathy, unspecified: Secondary | ICD-10-CM

## 2021-10-03 DIAGNOSIS — Z91148 Patient's other noncompliance with medication regimen for other reason: Secondary | ICD-10-CM

## 2021-10-03 DIAGNOSIS — Z7401 Bed confinement status: Secondary | ICD-10-CM | POA: Diagnosis not present

## 2021-10-03 DIAGNOSIS — Z79890 Hormone replacement therapy (postmenopausal): Secondary | ICD-10-CM

## 2021-10-03 DIAGNOSIS — Z8249 Family history of ischemic heart disease and other diseases of the circulatory system: Secondary | ICD-10-CM

## 2021-10-03 DIAGNOSIS — Z7951 Long term (current) use of inhaled steroids: Secondary | ICD-10-CM | POA: Diagnosis not present

## 2021-10-03 DIAGNOSIS — R4182 Altered mental status, unspecified: Secondary | ICD-10-CM | POA: Diagnosis not present

## 2021-10-03 DIAGNOSIS — R001 Bradycardia, unspecified: Secondary | ICD-10-CM | POA: Diagnosis not present

## 2021-10-03 LAB — CBC WITH DIFFERENTIAL/PLATELET
Abs Immature Granulocytes: 0.01 10*3/uL (ref 0.00–0.07)
Basophils Absolute: 0.1 10*3/uL (ref 0.0–0.1)
Basophils Relative: 1 %
Eosinophils Absolute: 0.2 10*3/uL (ref 0.0–0.5)
Eosinophils Relative: 4 %
HCT: 51.9 % — ABNORMAL HIGH (ref 36.0–46.0)
Hemoglobin: 16.9 g/dL — ABNORMAL HIGH (ref 12.0–15.0)
Immature Granulocytes: 0 %
Lymphocytes Relative: 20 %
Lymphs Abs: 0.9 10*3/uL (ref 0.7–4.0)
MCH: 31.2 pg (ref 26.0–34.0)
MCHC: 32.6 g/dL (ref 30.0–36.0)
MCV: 95.8 fL (ref 80.0–100.0)
Monocytes Absolute: 0.3 10*3/uL (ref 0.1–1.0)
Monocytes Relative: 7 %
Neutro Abs: 3.2 10*3/uL (ref 1.7–7.7)
Neutrophils Relative %: 68 %
Platelets: 102 10*3/uL — ABNORMAL LOW (ref 150–400)
RBC: 5.42 MIL/uL — ABNORMAL HIGH (ref 3.87–5.11)
RDW: 13.5 % (ref 11.5–15.5)
WBC: 4.7 10*3/uL (ref 4.0–10.5)
nRBC: 0 % (ref 0.0–0.2)

## 2021-10-03 LAB — COMPREHENSIVE METABOLIC PANEL
ALT: 20 U/L (ref 0–44)
AST: 30 U/L (ref 15–41)
Albumin: 3.5 g/dL (ref 3.5–5.0)
Alkaline Phosphatase: 45 U/L (ref 38–126)
Anion gap: 8 (ref 5–15)
BUN: 21 mg/dL (ref 8–23)
CO2: 31 mmol/L (ref 22–32)
Calcium: 8.9 mg/dL (ref 8.9–10.3)
Chloride: 97 mmol/L — ABNORMAL LOW (ref 98–111)
Creatinine, Ser: 0.81 mg/dL (ref 0.44–1.00)
GFR, Estimated: >60 mL/min (ref >60)
Glucose, Bld: 112 mg/dL — ABNORMAL HIGH (ref 70–99)
Potassium: 4.3 mmol/L (ref 3.5–5.1)
Sodium: 136 mmol/L (ref 135–145)
Total Bilirubin: 0.7 mg/dL (ref 0.3–1.2)
Total Protein: 7.1 g/dL (ref 6.5–8.1)

## 2021-10-03 LAB — LACTIC ACID, PLASMA
Lactic Acid, Venous: 0.7 mmol/L (ref 0.5–1.9)
Lactic Acid, Venous: 0.9 mmol/L (ref 0.5–1.9)

## 2021-10-03 LAB — URINALYSIS, ROUTINE W REFLEX MICROSCOPIC
Bilirubin Urine: NEGATIVE
Glucose, UA: NEGATIVE mg/dL
Ketones, ur: NEGATIVE mg/dL
Nitrite: NEGATIVE
Protein, ur: 100 mg/dL — AB
Specific Gravity, Urine: 1.017 (ref 1.005–1.030)
WBC, UA: >50 WBC/hpf — ABNORMAL HIGH (ref 0–5)
pH: 6 (ref 5.0–8.0)

## 2021-10-03 LAB — TROPONIN I (HIGH SENSITIVITY)
Troponin I (High Sensitivity): 14 ng/L (ref <18)
Troponin I (High Sensitivity): 18 ng/L — ABNORMAL HIGH (ref <18)

## 2021-10-03 LAB — PROTIME-INR
INR: 1 (ref 0.8–1.2)
Prothrombin Time: 13.5 seconds (ref 11.4–15.2)

## 2021-10-03 LAB — D-DIMER, QUANTITATIVE: D-Dimer, Quant: 2.1 ug/mL-FEU — ABNORMAL HIGH (ref 0.00–0.50)

## 2021-10-03 LAB — BRAIN NATRIURETIC PEPTIDE: B Natriuretic Peptide: 214.4 pg/mL — ABNORMAL HIGH (ref 0.0–100.0)

## 2021-10-03 LAB — SARS CORONAVIRUS 2 BY RT PCR: SARS Coronavirus 2 by RT PCR: NEGATIVE

## 2021-10-03 LAB — MAGNESIUM: Magnesium: 2.3 mg/dL (ref 1.7–2.4)

## 2021-10-03 LAB — LIPASE, BLOOD: Lipase: 33 U/L (ref 11–51)

## 2021-10-03 MED ORDER — FENTANYL CITRATE PF 50 MCG/ML IJ SOSY
50.0000 ug | PREFILLED_SYRINGE | Freq: Once | INTRAMUSCULAR | Status: AC
  Administered 2021-10-03: 50 ug via INTRAVENOUS
  Filled 2021-10-03: qty 1

## 2021-10-03 MED ORDER — LACTATED RINGERS IV BOLUS
1000.0000 mL | Freq: Once | INTRAVENOUS | Status: AC
  Administered 2021-10-03: 1000 mL via INTRAVENOUS

## 2021-10-03 MED ORDER — SODIUM CHLORIDE (PF) 0.9 % IJ SOLN
INTRAMUSCULAR | Status: AC
  Filled 2021-10-03: qty 50

## 2021-10-03 MED ORDER — SODIUM CHLORIDE 0.9 % IV SOLN
100.0000 mg | Freq: Two times a day (BID) | INTRAVENOUS | Status: DC
  Administered 2021-10-03 – 2021-10-06 (×6): 100 mg via INTRAVENOUS
  Filled 2021-10-03 (×7): qty 100

## 2021-10-03 MED ORDER — ACETAMINOPHEN 325 MG PO TABS
650.0000 mg | ORAL_TABLET | Freq: Four times a day (QID) | ORAL | Status: DC | PRN
  Administered 2021-10-03: 650 mg via ORAL
  Filled 2021-10-03 (×2): qty 2

## 2021-10-03 MED ORDER — SODIUM CHLORIDE 0.9 % IV SOLN
1.0000 g | Freq: Once | INTRAVENOUS | Status: AC
  Administered 2021-10-03: 1 g via INTRAVENOUS
  Filled 2021-10-03: qty 10

## 2021-10-03 MED ORDER — ONDANSETRON HCL 4 MG PO TABS: 4.0000 mg | ORAL_TABLET | Freq: Four times a day (QID) | ORAL | Status: DC | PRN

## 2021-10-03 MED ORDER — ONDANSETRON HCL 4 MG/2ML IJ SOLN
4.0000 mg | Freq: Four times a day (QID) | INTRAMUSCULAR | Status: DC | PRN
  Administered 2021-10-03: 4 mg via INTRAVENOUS
  Filled 2021-10-03: qty 2

## 2021-10-03 MED ORDER — IOHEXOL 350 MG/ML SOLN
100.0000 mL | Freq: Once | INTRAVENOUS | Status: AC | PRN
  Administered 2021-10-03: 100 mL via INTRAVENOUS

## 2021-10-03 MED ORDER — DOCUSATE SODIUM 100 MG PO CAPS
100.0000 mg | ORAL_CAPSULE | Freq: Two times a day (BID) | ORAL | Status: DC
  Administered 2021-10-03 – 2021-10-06 (×6): 100 mg via ORAL
  Filled 2021-10-03 (×6): qty 1

## 2021-10-03 MED ORDER — CARVEDILOL 12.5 MG PO TABS
12.5000 mg | ORAL_TABLET | Freq: Two times a day (BID) | ORAL | Status: DC
Start: 2021-10-04 — End: 2021-10-06
  Administered 2021-10-04 – 2021-10-06 (×5): 12.5 mg via ORAL
  Filled 2021-10-03 (×5): qty 1

## 2021-10-03 MED ORDER — ACETAMINOPHEN 650 MG RE SUPP: 650.0000 mg | Freq: Four times a day (QID) | RECTAL | Status: DC | PRN

## 2021-10-03 MED ORDER — METOCLOPRAMIDE HCL 5 MG/ML IJ SOLN
5.0000 mg | Freq: Once | INTRAMUSCULAR | Status: AC
  Administered 2021-10-03: 5 mg via INTRAVENOUS
  Filled 2021-10-03: qty 2

## 2021-10-03 MED ORDER — GABAPENTIN 100 MG PO CAPS
100.0000 mg | ORAL_CAPSULE | Freq: Three times a day (TID) | ORAL | Status: DC
  Administered 2021-10-03 – 2021-10-06 (×8): 100 mg via ORAL
  Filled 2021-10-03 (×8): qty 1

## 2021-10-03 MED ORDER — MOMETASONE FURO-FORMOTEROL FUM 100-5 MCG/ACT IN AERO
2.0000 | INHALATION_SPRAY | Freq: Two times a day (BID) | RESPIRATORY_TRACT | Status: DC
  Administered 2021-10-03 – 2021-10-06 (×6): 2 via RESPIRATORY_TRACT
  Filled 2021-10-03: qty 8.8

## 2021-10-03 MED ORDER — LACTATED RINGERS IV BOLUS
250.0000 mL | Freq: Once | INTRAVENOUS | Status: AC
  Administered 2021-10-03: 250 mL via INTRAVENOUS

## 2021-10-03 MED ORDER — ENOXAPARIN SODIUM 40 MG/0.4ML IJ SOSY
40.0000 mg | PREFILLED_SYRINGE | INTRAMUSCULAR | Status: DC
  Administered 2021-10-03 – 2021-10-04 (×2): 40 mg via SUBCUTANEOUS
  Filled 2021-10-03 (×2): qty 0.4

## 2021-10-03 MED ORDER — LEVOTHYROXINE SODIUM 112 MCG PO TABS
112.0000 ug | ORAL_TABLET | Freq: Every day | ORAL | Status: DC
  Administered 2021-10-04: 112 ug via ORAL
  Filled 2021-10-03: qty 1

## 2021-10-03 MED ORDER — SODIUM CHLORIDE 0.9 % IV SOLN: 500.0000 mg | INTRAVENOUS | Status: DC

## 2021-10-03 MED ORDER — SODIUM CHLORIDE 0.9 % IV SOLN
1.0000 g | INTRAVENOUS | Status: DC
  Administered 2021-10-04 – 2021-10-06 (×3): 1 g via INTRAVENOUS
  Filled 2021-10-03 (×3): qty 10

## 2021-10-03 MED ORDER — OXYCODONE HCL 5 MG PO TABS
5.0000 mg | ORAL_TABLET | Freq: Four times a day (QID) | ORAL | Status: DC | PRN
  Administered 2021-10-03 – 2021-10-04 (×2): 5 mg via ORAL
  Filled 2021-10-03 (×2): qty 1

## 2021-10-03 NOTE — ED Provider Notes (Signed)
[place] DEPT Provider Note   CSN: [id number] Arrival date & time: 10/03/21  1223     History  Chief Complaint  Patient presents with   Weakness   Nausea   Emesis    [name] is a 88 y.o. female.   Weakness Associated symptoms: abdominal pain, nausea, shortness of breath and vomiting   Emesis Associated symptoms: abdominal pain    Patient presents via EMS for reported generalized weakness, abdominal pain, and emesis.  Prior to arrival, 8 mg of Zofran was given.  Medical history includes hypothyroidism, COPD, CHF, HLD, small bowel obstruction, HTN, and placement of a pacemaker/ICD.  On arrival, patient endorses shortness of breath and continued nausea.  She endorses mid back pain.  She is unable to describe recent symptoms or duration of symptoms.  She denies any other current areas of discomfort.    Home Medications Prior to Admission medications   Medication Sig Start Date End Date Taking? Authorizing Provider  aspirin EC 81 MG tablet Take 1 tablet (81 mg total) by mouth daily. Patient taking differently: Take 81 mg by mouth daily as needed (bloodthinner). 10/03/17   Croitoru, Mihai, MD  carvedilol (COREG) 12.5 MG tablet Take 1 tablet (12.5 mg total) by mouth in the morning and at bedtime. 06/24/21   Croitoru, Mihai, MD  Cholecalciferol (VITAMIN D3 PO) Take 2 tablets by mouth daily.    [provider]  docusate sodium (COLACE) 100 MG capsule Take 1 capsule (100 mg total) by mouth every 12 (twelve) hours. 01/12/21   Sherwood Gambler, MD  dorzolamide-timolol (COSOPT) 22.3-6.8 MG/ML ophthalmic solution Place 1 drop into both eyes 2 (two) times daily. 02/18/20   Shelly Coss, MD  DULERA 100-5 MCG/ACT AERO Inhale 2 puffs into the lungs every 6 (six) hours as needed for wheezing or shortness of breath.  08/29/19   [provider]  DULoxetine (CYMBALTA) 60 MG capsule Take 60 mg by mouth daily.  08/25/15   [provider]   furosemide (LASIX) 20 MG tablet Take 40 mg (two tablets) in the morning and 20 mg in the evening. 08/04/20   Croitoru, Mihai, MD  furosemide (LASIX) 40 MG tablet TAKE 1 TABLET BY MOUTH EVERY DAY 08/27/20   Croitoru, Mihai, MD  gabapentin (NEURONTIN) 100 MG capsule Take 100 mg by mouth 3 (three) times daily.    [provider]  latanoprost (XALATAN) 0.005 % ophthalmic solution Place 1 drop into both eyes at bedtime. 02/18/20   Shelly Coss, MD  liothyronine (CYTOMEL) 25 MCG tablet Take 25 mcg by mouth daily. 07/02/15   [provider]  oxyCODONE (ROXICODONE) 15 MG immediate release tablet Take 1 tablet (15 mg total) by mouth every 6 (six) hours as needed for pain. Patient taking differently: Take 15 mg by mouth in the morning, at noon, in the evening, and at bedtime. Takes at 0300 0900 1500 2100 02/17/20   Shelly Coss, MD  polyethylene glycol (MIRALAX / GLYCOLAX) 17 g packet Take 17 g by mouth daily. 01/12/21   Sherwood Gambler, MD  Prenatal Vit-Fe Fumarate-FA (PRENATAL VITAMINS PO) Take 1 tablet by mouth daily.    [provider]  SYNTHROID 112 MCG tablet Take 112 mcg by mouth daily before breakfast.  09/12/15   [provider]  traZODone (DESYREL) 50 MG tablet Take 50 mg by mouth at bedtime as needed for sleep.  01/10/20   [provider]  Zinc Oxide 10 % OINT Apply 1 application topically 4 (four)  times daily as needed (bed sores).    [provider]      Allergies    Digitalis    Review of Systems   Review of Systems  Unable to perform ROS: Mental status change  Respiratory:  Positive for shortness of breath.   Gastrointestinal:  Positive for abdominal pain, nausea and vomiting.  Musculoskeletal:  Positive for back pain.  Neurological:  Positive for weakness.    Physical Exam Updated Vital Signs BP (!) 158/97   Pulse 80   Temp 99.7 F (37.6 C) (Rectal)   Resp 15   SpO2 98%  Physical Exam Vitals and nursing note reviewed.   Constitutional:      General: She is not in acute distress.    Appearance: Normal appearance. She is well-developed. She is not toxic-appearing or diaphoretic.  HENT:     Head: Normocephalic and atraumatic.     Right Ear: External ear normal.     Left Ear: External ear normal.     Nose: Nose normal. No congestion.     Mouth/Throat:     Mouth: Mucous membranes are moist.     Pharynx: Oropharynx is clear.  Eyes:     Extraocular Movements: Extraocular movements intact.     Conjunctiva/sclera: Conjunctivae normal.  Cardiovascular:     Rate and Rhythm: Normal rate and regular rhythm.     Heart sounds: No murmur heard. Pulmonary:     Effort: Pulmonary effort is normal. No respiratory distress.     Breath sounds: Normal breath sounds. No wheezing or rales.  Chest:     Chest wall: No tenderness.  Abdominal:     General: There is no distension.     Palpations: Abdomen is soft.     Tenderness: There is no abdominal tenderness.  Musculoskeletal:        General: No swelling. Normal range of motion.     Cervical back: Normal range of motion and neck supple. No rigidity.     Right lower leg: No edema.     Left lower leg: No edema.  Skin:    General: Skin is warm and dry.     Capillary Refill: Capillary refill takes less than 2 seconds.     Coloration: Skin is not jaundiced or pale.  Neurological:     General: No focal deficit present.     Mental Status: She is alert. She is disoriented.     Cranial Nerves: No cranial nerve deficit.     Sensory: No sensory deficit.     Motor: No weakness.     Coordination: Coordination normal.  Psychiatric:        Mood and Affect: Mood normal.        Behavior: Behavior normal.     ED Results / Procedures / Treatments   Labs (all labs ordered are listed, but only abnormal results are displayed) Labs Reviewed  COMPREHENSIVE METABOLIC PANEL - Abnormal; Notable for the following components:      Result Value   Chloride 97 (*)    Glucose, Bld 112  (*)    All other components within normal limits  BRAIN NATRIURETIC PEPTIDE - Abnormal; Notable for the following components:   B Natriuretic Peptide 214.4 (*)    All other components within normal limits  D-DIMER, QUANTITATIVE - Abnormal; Notable for the following components:   D-Dimer, Quant 2.10 (*)    All other components within normal limits  URINALYSIS, ROUTINE W REFLEX MICROSCOPIC - Abnormal; Notable for the following  components:   APPearance CLOUDY (*)    Hgb urine dipstick SMALL (*)    Protein, ur 100 (*)    Leukocytes,Ua LARGE (*)    WBC, UA >50 (*)    Bacteria, UA FEW (*)    All other components within normal limits  CULTURE, BLOOD (ROUTINE X 2)  CULTURE, BLOOD (ROUTINE X 2)  URINE CULTURE  LIPASE, BLOOD  PROTIME-INR  MAGNESIUM  LACTIC ACID, PLASMA  CBC WITH DIFFERENTIAL/PLATELET  LACTIC ACID, PLASMA  CBC WITH DIFFERENTIAL/PLATELET  TROPONIN I (HIGH SENSITIVITY)  TROPONIN I (HIGH SENSITIVITY)    EKG EKG Interpretation  Date/Time:  Saturday October 03 2021 12:54:54 EDT Ventricular Rate:  82 PR Interval:  185 QRS Duration: 138 QT Interval:  463 QTC Calculation: 541 R Axis:   212 Text Interpretation: Atrial-sensed ventricular-paced rhythm Prolonged QT interval Confirmed by Godfrey Pick [phone]) on 10/03/2021 1:31:54 PM  Radiology DG Chest Port 1 View  Result Date: 10/03/2021 CLINICAL DATA:  Nausea. EXAM: PORTABLE CHEST 1 VIEW COMPARISON:  None Available. FINDINGS: Mild ill-defined left basilar opacities. Otherwise, lungs are clear. Similar cardiomediastinal silhouette. Left subclavian approach cardiac rhythm maintenance device. Limited visualization left lung base. No definite pleural effusion. No visible pneumothorax. IMPRESSION: Left basilar opacities, which could represent atelectasis, aspiration, and/or pneumonia. Electronically Signed   By: Margaretha Sheffield M.D.   On: 10/03/2021 13:40    Procedures Procedures    Medications Ordered in ED Medications   cefTRIAXone (ROCEPHIN) 1 g in sodium chloride 0.9 % 100 mL IVPB (1 g Intravenous New Bag/Given 10/03/21 1554)  lactated ringers bolus 250 mL (0 mLs Intravenous Stopped 10/03/21 1418)  sodium chloride (PF) 0.9 % injection (  Given 10/03/21 1513)  lactated ringers bolus 1,000 mL (1,000 mLs Intravenous New Bag/Given 10/03/21 1552)  iohexol (OMNIPAQUE) 350 MG/ML injection 100 mL (100 mLs Intravenous Contrast Given 10/03/21 1602)    ED Course/ Medical Decision Making/ A&P                           Medical Decision Making Amount and/or Complexity of Data Reviewed Labs: ordered. Radiology: ordered. ECG/medicine tests: ordered.  Risk Prescription drug management.   This patient presents to the ED for concern of multiple complaints, this involves an extensive number of treatment options, and is a complaint that carries with it a high risk of complications and morbidity.  The differential diagnosis includes small bowel obstruction, gastritis, PUD, GERD, ACS, pancreatitis, pyelonephritis, constipation   Co morbidities that complicate the patient evaluation  hypothyroidism, COPD, CHF, HLD, small bowel obstruction, HTN, and placement of a pacemaker/ICD   Additional history obtained:  Additional history obtained from N/A External records from outside source obtained and reviewed including EMR   Lab Tests:  I Ordered, and personally interpreted labs.  The pertinent results include: UA consistent with UTI; BNP and D-dimer moderately elevated; initial lab work otherwise unremarkable; CBC pending at time of signout   Imaging Studies ordered:  I ordered imaging studies including chest x-ray, CT imaging of head, CTA chest, and CT of abdomen and pelvis I independently visualized and interpreted imaging which showed findings on chest x-ray consistent with atelectasis and/or pneumonia.  Results of CT imaging pending at time of signout. I agree with the radiologist interpretation   Cardiac Monitoring:  / EKG:  The patient was maintained on a cardiac monitor.  I personally viewed and interpreted the cardiac monitored which showed an underlying rhythm of: Sinus rhythm  Problem  List / ED Course / Critical interventions / Medication management  Patient is a 88 year old female presenting from home for multiple complaints.  EMS reports that she has had abdominal pain, nausea, and vomiting.  On arrival, she endorses nausea and shortness of breath.  She is alert and oriented x2.  She has a difficult time describing her recent symptoms over the duration of them.  She states that she does live at home with her daughter and granddaughter.  I attempted to contact her daughter by telephone but was unable to obtain a answer.  Patient was given gentle IV fluids.  Broad diagnostic work-up was initiated.  Initial results of work-up show possible pneumonia on chest x-ray and evidence of UTI on urinalysis.  Ceftriaxone was ordered.  Given multiple symptoms and concern of possible altered mentation (mental baseline is unknown, but dementia is not noted on previous EMR documentation), CT imaging of head, chest, abdomen, and pelvis was ordered.  Prior to results of these images, care of patient was signed out to oncoming ED provider. I ordered medication including IV fluids for hydration; ceftriaxone for UTI and possible pneumonia Reevaluation of the patient after these medicines showed that the patient stayed the same I have reviewed the patients home medicines and have made adjustments as needed   Social Determinants of Health:  Lives at home with daughter and granddaughter         Final Clinical Impression(s) / ED Diagnoses Final diagnoses:  Nausea and vomiting, unspecified vomiting type  Shortness of breath    Rx / DC Orders ED Discharge Orders     None         Godfrey Pick, MD 10/03/21 1612

## 2021-10-03 NOTE — ED Notes (Signed)
ED TO INPATIENT HANDOFF REPORT  ED Nurse Name and Phone #:   S Name/Age/Gender [name] 88 y.o. female Room/Bed: WA24/WA24  Code Status   Code Status: DNR  Home/SNF/Other Home Patient oriented to: self, place, time, and situation Is this baseline? Yes   Triage Complete: Triage complete  Chief Complaint Intractable nausea and vomiting [R11.2]  Triage Note Pt BIB EMS from home, c/o N/V/D x 3 days. Increased weakness with abdominal pain. ABD pain reportedly comes and goes. Watery stool, multiple hernias, V-paced on telemetry. 22 gauge LH, received 8 mg Zofran with effective results  BP 170/81 70 v-paced 98% 3 liters CBG 141   Allergies Allergies  Allergen Reactions   Digitalis Other (See Comments)    Poisoned patient - patient was hospitalized    Level of Care/Admitting Diagnosis ED Disposition     ED Disposition  Admit   Condition  --   Comment  Hospital Area: [place] [[id number]]  Level of Care: Telemetry [5]  Admit to tele based on following criteria: Complex arrhythmia (Bradycardia/Tachycardia)  May place patient in observation at [facility] or [facility] if equivalent level of care is available:: No  Covid Evaluation: Asymptomatic - no recent exposure (last 10 days) testing not required  Diagnosis: Intractable nausea and vomiting [700174]  Admitting Physician: Caren Griffins [[id number]]  Attending Physician: Caren Griffins [phone]          B Medical/Surgery History Past Medical History:  Diagnosis Date   AICD (automatic cardioverter/defibrillator) present 06/27/2007   medtronic   ARF (acute renal failure) ([place]) 09/2017   Cancer ([place])    ovarian--stage 4   Cardiomyopathy ([place])    CHF (congestive heart failure) ([place])    COPD (chronic obstructive pulmonary disease) ([place])    Dyspnea    Hypothyroidism    Peripheral neuropathy    Systemic hypertension    Thyroid disease    Past Surgical History:  Procedure Laterality  Date   ABDOMINAL HYSTERECTOMY  oct 2000   along with ovarian ca surg   APPENDECTOMY     BIV PACEMAKER GENERATOR CHANGEOUT N/A 09/10/2019   Procedure: [place];  Surgeon: Sanda Klein, MD;  Location: [facility];  Service: Cardiovascular;  Laterality: N/A;   CARDIAC CATHETERIZATION  09/27/1990   normal coronaries,dilated cardiomyopathy   CARDIAC DEFIBRILLATOR PLACEMENT  06/27/2007   medtronic concerto   CHOLECYSTECTOMY     IMPLANTABLE CARDIOVERTER DEFIBRILLATOR GENERATOR CHANGE N/A 07/31/2013   Procedure: IMPLANTABLE CARDIOVERTER DEFIBRILLATOR GENERATOR CHANGE;  Surgeon: Sanda Klein, MD;  Location: [facility];  Service: Cardiovascular;  Laterality: N/A;   NM MYOCAR PERF WALL MOTION  06/05/2007   no ischemia     A IV Location/Drains/Wounds Patient Lines/Drains/Airways Status     Active Line/Drains/Airways     Name Placement date Placement time Site Days   Peripheral IV 10/03/21 20 G Anterior;Left Hand 10/03/21  1330  Hand  less than 1   Peripheral IV 10/03/21 20 G 1.88" Right;Anterior;Medial Forearm 10/03/21  1554  Forearm  less than 1   External Urinary Catheter 02/11/20  2330  --  600   Wound / Incision (Open or Dehisced) 02/13/20 (MASD) Moisture Associated Skin Damage Buttocks Left;Right 02/13/20  --  Buttocks  598   Wound / Incision (Open or Dehisced) 05/17/20 Buttocks Right skin tear 1 cm x 1 cm 05/17/20  1830  Buttocks  504            Intake/Output Last  24 hours  Intake/Output Summary (Last 24 hours) at 10/03/2021 2049 Last data filed at 10/03/2021 1722 Gross per 24 hour  Intake 1295.83 ml  Output --  Net 1295.83 ml    Labs/Imaging Results for orders placed or performed during the hospital encounter of 10/03/21 (from the past 48 hour(s))  Comprehensive metabolic panel     Status: Abnormal   Collection Time: 10/03/21 12:38 PM  Result Value Ref Range   Sodium 136 135 - 145 mmol/L   Potassium 4.3 3.5 - 5.1 mmol/L   Chloride 97 (L) 98 -  111 mmol/L   CO2 31 22 - 32 mmol/L   Glucose, Bld 112 (H) 70 - 99 mg/dL    Comment: Glucose reference range applies only to samples taken after fasting for at least 8 hours.   BUN 21 8 - 23 mg/dL   Creatinine, Ser 0.81 0.44 - 1.00 mg/dL   Calcium 8.9 8.9 - 10.3 mg/dL   Total Protein 7.1 6.5 - 8.1 g/dL   Albumin 3.5 3.5 - 5.0 g/dL   AST 30 15 - 41 U/L   ALT 20 0 - 44 U/L   Alkaline Phosphatase 45 38 - 126 U/L   Total Bilirubin 0.7 0.3 - 1.2 mg/dL   GFR, Estimated >60 >60 mL/min    Comment: (NOTE) Calculated using the CKD-EPI Creatinine Equation (2021)    Anion gap 8 5 - 15    Comment: Performed at [facility], [place] [address], [place], [place] [address]  Lipase, blood     Status: None   Collection Time: 10/03/21 12:38 PM  Result Value Ref Range   Lipase 33 11 - 51 U/L    Comment: Performed at [facility], [place] [address] [place], [place] [address]  Troponin I (High Sensitivity)     Status: None   Collection Time: 10/03/21 12:38 PM  Result Value Ref Range   Troponin I (High Sensitivity) 14 <18 ng/L    Comment: (NOTE) Elevated high sensitivity troponin I (hsTnI) values and significant  changes across serial measurements may suggest ACS but many other  chronic and acute conditions are known to elevate hsTnI results.  Refer to the "Links" section for chest pain algorithms and additional  guidance. Performed at [facility], [place] [address] [place], [place] [address]   D-dimer, quantitative     Status: Abnormal   Collection Time: 10/03/21 12:38 PM  Result Value Ref Range   D-Dimer, Quant 2.10 (H) 0.00 - 0.50 ug/mL-FEU    Comment: (NOTE) At the manufacturer cut-off value of 0.5 g/mL FEU, this assay has a negative predictive value of 95-100%.This assay is intended for use in conjunction with a clinical pretest probability (PTP) assessment model to exclude pulmonary embolism (PE) and deep venous thrombosis (DVT) in  outpatients suspected of PE or DVT. Results should be correlated with clinical presentation. Performed at [facility], [place] [address], [place], [place] [address]   Protime-INR     Status: None   Collection Time: 10/03/21 12:38 PM  Result Value Ref Range   Prothrombin Time 13.5 11.4 - 15.2 seconds   INR 1.0 0.8 - 1.2    Comment: (NOTE) INR goal varies based on device and disease states. Performed at [facility], [place] [address], [place], [place] [address]   Magnesium     Status: None   Collection Time: 10/03/21 12:38 PM  Result Value Ref Range   Magnesium 2.3 1.7 - 2.4  mg/dL    Comment: Performed at [facility], [place] [address], [place], [place] [address]  Brain natriuretic peptide     Status: Abnormal   Collection Time: 10/03/21 12:39 PM  Result Value Ref Range   B Natriuretic Peptide 214.4 (H) 0.0 - 100.0 pg/mL    Comment: Performed at [facility], [place] [address] [place], [place] [address]  Lactic acid, plasma     Status: None   Collection Time: 10/03/21  1:33 PM  Result Value Ref Range   Lactic Acid, Venous 0.7 0.5 - 1.9 mmol/L    Comment: Performed at [facility], [place] [address], [place], [place] [address]  Troponin I (High Sensitivity)     Status: Abnormal   Collection Time: 10/03/21  2:39 PM  Result Value Ref Range   Troponin I (High Sensitivity) 18 (H) <18 ng/L    Comment: (NOTE) Elevated high sensitivity troponin I (hsTnI) values and significant  changes across serial measurements may suggest ACS but many other  chronic and acute conditions are known to elevate hsTnI results.  Refer to the "Links" section for chest pain algorithms and additional  guidance. Performed at [facility], [place] [address], [place], [place] [address]   Urinalysis, Routine w reflex microscopic Urine, Clean Catch     Status: Abnormal   Collection Time: 10/03/21  2:59 PM   Result Value Ref Range   Color, Urine YELLOW YELLOW   APPearance CLOUDY (A) CLEAR   Specific Gravity, Urine 1.017 1.005 - 1.030   pH 6.0 5.0 - 8.0   Glucose, UA NEGATIVE NEGATIVE mg/dL   Hgb urine dipstick SMALL (A) NEGATIVE   Bilirubin Urine NEGATIVE NEGATIVE   Ketones, ur NEGATIVE NEGATIVE mg/dL   Protein, ur 100 (A) NEGATIVE mg/dL   Nitrite NEGATIVE NEGATIVE   Leukocytes,Ua LARGE (A) NEGATIVE   RBC / HPF 6-10 0 - 5 RBC/hpf   WBC, UA >50 (H) 0 - 5 WBC/hpf   Bacteria, UA FEW (A) NONE SEEN   Squamous Epithelial / LPF 0-5 0 - 5   Mucus PRESENT     Comment: Performed at [facility], [place] [address] [place], [place] [address]  CBC with Differential/Platelet     Status: Abnormal   Collection Time: 10/03/21  4:52 PM  Result Value Ref Range   WBC 4.7 4.0 - 10.5 K/uL   RBC 5.42 (H) 3.87 - 5.11 MIL/uL   Hemoglobin 16.9 (H) 12.0 - 15.0 g/dL   HCT 51.9 (H) 36.0 - 46.0 %   MCV 95.8 80.0 - 100.0 fL   MCH 31.2 26.0 - 34.0 pg   MCHC 32.6 30.0 - 36.0 g/dL   RDW 13.5 11.5 - 15.5 %   Platelets 102 (L) 150 - 400 K/uL    Comment: Immature Platelet Fraction may be clinically indicated, consider ordering this additional test [id number]    nRBC 0.0 0.0 - 0.2 %   Neutrophils Relative % 68 %   Neutro Abs 3.2 1.7 - 7.7 K/uL   Lymphocytes Relative 20 %   Lymphs Abs 0.9 0.7 - 4.0 K/uL   Monocytes Relative 7 %   Monocytes Absolute 0.3 0.1 - 1.0 K/uL   Eosinophils Relative 4 %   Eosinophils Absolute 0.2 0.0 - 0.5 K/uL   Basophils Relative 1 %   Basophils Absolute 0.1 0.0 - 0.1 K/uL   Immature Granulocytes 0 %   Abs Immature Granulocytes 0.01 0.00 - 0.07 K/uL  Comment: Performed at [facility], [place] [address] [place], [place] [address]  SARS Coronavirus 2 by RT PCR (hospital order, performed in [facility] hospital lab) *cepheid single result test* Anterior Nasal Swab     Status: None   Collection Time: 10/03/21  5:55 PM   Specimen: Anterior Nasal Swab   Result Value Ref Range   SARS Coronavirus 2 by RT PCR NEGATIVE NEGATIVE    Comment: (NOTE) SARS-CoV-2 target nucleic acids are NOT DETECTED.  The SARS-CoV-2 RNA is generally detectable in upper and lower respiratory specimens during the acute phase of infection. The lowest concentration of SARS-CoV-2 viral copies this assay can detect is 250 copies / mL. A negative result does not preclude SARS-CoV-2 infection and should not be used as the sole basis for treatment or other patient management decisions.  A negative result may occur with improper specimen collection / handling, submission of specimen other than nasopharyngeal swab, presence of viral mutation(s) within the areas targeted by this assay, and inadequate number of viral copies (<250 copies / mL). A negative result must be combined with clinical observations, patient history, and epidemiological information.  Fact Sheet for Patients:   https://www.patel.info/  Fact Sheet for Healthcare Providers: https://hall.com/  This test is not yet approved or  cleared by the [place] FDA and has been authorized for detection and/or diagnosis of SARS-CoV-2 by FDA under an Emergency Use Authorization (EUA).  This EUA will remain in effect (meaning this test can be used) for the duration of the COVID-19 declaration under Section 564(b)(1) of the Act, 21 U.S.C. section 360bbb-3(b)(1), unless the authorization is terminated or revoked sooner.  Performed at [facility], [place] [address] [place], [place] [address]    CT Angio Chest PE W and/or Wo Contrast  Result Date: 10/03/2021 CLINICAL DATA:  Suspected pulmonary embolus. Nausea vomiting and diarrhea.  Weakness. EXAM: CT ANGIOGRAPHY CHEST CT ABDOMEN AND PELVIS WITH CONTRAST TECHNIQUE: Multidetector CT imaging of the chest was performed using the standard protocol during bolus administration of intravenous contrast.  Multiplanar CT image reconstructions and MIPs were obtained to evaluate the vascular anatomy. Multidetector CT imaging of the abdomen and pelvis was performed using the standard protocol during bolus administration of intravenous contrast. RADIATION DOSE REDUCTION: This exam was performed according to the departmental dose-optimization program which includes automated exposure control, adjustment of the mA and/or kV according to patient size and/or use of iterative reconstruction technique. CONTRAST:  134m OMNIPAQUE IOHEXOL 350 MG/ML SOLN COMPARISON:  Chest CT March 15, 2019 CT of the abdomen May 17, 2015 FINDINGS: CTA CHEST FINDINGS Cardiovascular: Satisfactory opacification of the pulmonary arteries to the segmental level. No evidence of pulmonary embolism. Pulmonary artery enlargement, usually associated with pulmonary arterial hypertension. Enlarged heart. Molar calcifications. Calcific atherosclerotic disease of the aorta. Mediastinum/Nodes: No enlarged mediastinal, hilar, or axillary lymph nodes. Thyroid gland, trachea, and esophagus demonstrate no significant findings. Lungs/Pleura: Small left pleural effusion. Left lower lobe atelectasis. Subtle tree in bud opacities in the right upper lobe and right middle lobe, likely inflammatory. Musculoskeletal: No chest wall abnormality. No acute or significant osseous findings. Cardiac pacemaker noted. Review of the MIP images confirms the above findings. CT ABDOMEN and PELVIS FINDINGS Hepatobiliary: No focal liver abnormality is seen. Status post cholecystectomy. No biliary dilatation. Pancreas: Unremarkable. No pancreatic ductal dilatation or surrounding inflammatory changes. Spleen: Normal in size without focal abnormality. Adrenals/Urinary Tract: Prominent adrenal glands. Atrophic left kidney. Bilateral extrarenal pelvises. Normal urinary bladder. Stomach/Bowel: Stomach is  within normal limits. No evidence of bowel distention, or inflammatory changes. Non  specific diffuse mucosal thickening of the rectum. Vascular/Lymphatic: Aortic atherosclerosis. No enlarged abdominal or pelvic lymph nodes. Reproductive: Status post hysterectomy. No adnexal masses. Other: Left supraumbilical anterior abdominal wall hernia with wide neck containing norm incarcerated bowel. A second narrower neck left anterior abdominal wall hernia, more inferiorly contains nonincarcerated small bowel. Musculoskeletal: Inferior endplate compression deformity of T12 vertebral body without unknown acuity 2. Multilevel degenerative changes of the lumbosacral spine. Has shaped scoliosis. Review of the MIP images confirms the above findings. IMPRESSION: 1. No evidence of pulmonary embolus. 2. Pulmonary artery enlargement, usually associated with pulmonary arterial hypertension. 3. Small left pleural effusion with left lower lobe atelectasis. 4. Subtle tree in bud opacities in the right upper lobe and right middle lobe, likely inflammatory or infectious. 5. No acute abnormalities in the abdomen or pelvis. 6. Atrophic left kidney. 7. Two left anterior abdominal wall hernias containing non incarcerated bowel. 8. Non specific diffuse mucosal thickening of the rectum. Please correlate with patient's symptoms. 9. Age-indeterminate compression fracture of T12 vertebral body with approximately 50% height loss. Aortic Atherosclerosis (ICD10-I70.0). Electronically Signed   By: Fidela [place]isbury M.D.   On: 10/03/2021 17:04   CT ABDOMEN PELVIS W CONTRAST  Result Date: 10/03/2021 CLINICAL DATA:  Nausea and vomiting.  Abdominal pain.  05/17/2015 EXAM: CT ABDOMEN AND PELVIS WITH CONTRAST TECHNIQUE: Multidetector CT imaging of the abdomen and pelvis was performed using the standard protocol following bolus administration of intravenous contrast. RADIATION DOSE REDUCTION: This exam was performed according to the departmental dose-optimization program which includes automated exposure control, adjustment of the mA  and/or kV according to patient size and/or use of iterative reconstruction technique. CONTRAST:  172m OMNIPAQUE IOHEXOL 350 MG/ML SOLN COMPARISON:  None Available. FINDINGS: Lower chest: Mild collapse/consolidative opacity seen in the lingula and left lower lobe. Bronchiectasis with airway impaction is seen inferior right middle and lower lobes. 8 mm right lower lobe nodule seen on 30/6/8. 5 mm right lower lobe nodule seen on image 7/8. Hepatobiliary: No suspicious focal abnormality within the liver parenchyma. Gallbladder surgically absent. No intrahepatic or extrahepatic biliary dilation. Pancreas: Pancreatic parenchyma diffusely atrophic without main duct dilatation. Spleen: No splenomegaly. No focal mass lesion. Adrenals/Urinary Tract: Bilateral adrenal gland thickening without a discrete nodule or mass. Left kidney atrophic. Mild fullness noted right intrarenal collecting system with patulous renal pelvis. No evidence for hydroureter. Contrast material in the distal left ureter is likely from CTA chest performed earlier. The urinary bladder appears normal for the degree of distention. Stomach/Bowel: Stomach is nondistended. Mild wall thickening noted in the antral region, nonspecific potentially related to peristalsis. Duodenum is normally positioned as is the ligament of Treitz. No small bowel wall thickening. No small bowel dilatation. Nonvisualization of the appendix is consistent with the reported history of appendectomy. No gross colonic mass. No colonic wall thickening. Vascular/Lymphatic: There is advanced atherosclerotic calcification of the abdominal aorta without aneurysm. There is no gastrohepatic or hepatoduodenal ligament lymphadenopathy. No retroperitoneal or mesenteric lymphadenopathy. No pelvic sidewall lymphadenopathy. Reproductive: The uterus is surgically absent. There is no adnexal mass. Other: No intraperitoneal free fluid. Musculoskeletal: No worrisome lytic or sclerotic osseous  abnormality. Degenerative changes noted right hip with evidence of underlying avascular necrosis. Compression deformity noted at T12 with advanced degenerative changes in the lumbar spine. IMPRESSION: 1. No acute findings in the abdomen or pelvis. 2. Mild wall thickening in the antral region of the stomach, nonspecific and potentially  related to peristalsis. Gastritis not excluded. 3. Left renal atrophy. 4. Bronchiectasis with airway impaction in the inferior right middle and lower lobes. 5. 8 mm right lower lobe pulmonary nodule with 5 mm right lower lobe pulmonary nodule. Non-contrast chest CT at 3-6 months is recommended. If the nodules are stable at time of repeat CT, then future CT at 18-24 months (from today's scan) is considered optional for low-risk patients, but is recommended for high-risk patients. This recommendation follows the consensus statement: Guidelines for Management of Incidental Pulmonary Nodules Detected on CT Images: From the Fleischner Society 2017; Radiology 2017; 284:228-243. 6. Compression deformity at T12 with advanced degenerative changes in the lumbar spine. 7. Mild collapse/consolidative opacity seen in the lingula and left lower lobe. 8. Aortic Atherosclerosis (ICD10-I70.0). Electronically Signed   By: Misty Stanley M.D.   On: 10/03/2021 16:57   CT Head Wo Contrast  Result Date: 10/03/2021 CLINICAL DATA:  Mental status changes. EXAM: CT HEAD WITHOUT CONTRAST TECHNIQUE: Contiguous axial images were obtained from the base of the skull through the vertex without intravenous contrast. RADIATION DOSE REDUCTION: This exam was performed according to the departmental dose-optimization program which includes automated exposure control, adjustment of the mA and/or kV according to patient size and/or use of iterative reconstruction technique. COMPARISON:  05/16/2020 FINDINGS: Brain: There is no evidence for acute hemorrhage, hydrocephalus, mass lesion, or abnormal extra-axial fluid  collection. No definite CT evidence for acute infarction. Diffuse loss of parenchymal volume is consistent with atrophy. Patchy low attenuation in the deep hemispheric and periventricular white matter is nonspecific, but likely reflects chronic microvascular ischemic demyelination. Probable arachnoid cyst right middle cranial fossa is stable in the interval. Vascular: No hyperdense vessel or unexpected calcification. Skull: No evidence for fracture. No worrisome lytic or sclerotic lesion. Sinuses/Orbits: The visualized paranasal sinuses and mastoid air cells are clear. Visualized portions of the globes and intraorbital fat are unremarkable. Other: None. IMPRESSION: 1. No acute intracranial abnormality. 2. Atrophy with chronic small vessel ischemic disease. 3. Stable appearance arachnoid cyst anterior right middle cranial fossa. Electronically Signed   By: Misty Stanley M.D.   On: 10/03/2021 16:42   DG Chest Port 1 View  Result Date: 10/03/2021 CLINICAL DATA:  Nausea. EXAM: PORTABLE CHEST 1 VIEW COMPARISON:  None Available. FINDINGS: Mild ill-defined left basilar opacities. Otherwise, lungs are clear. Similar cardiomediastinal silhouette. Left subclavian approach cardiac rhythm maintenance device. Limited visualization left lung base. No definite pleural effusion. No visible pneumothorax. IMPRESSION: Left basilar opacities, which could represent atelectasis, aspiration, and/or pneumonia. Electronically Signed   By: Margaretha Sheffield M.D.   On: 10/03/2021 13:40    Pending Labs Unresulted Labs (From admission, onward)     Start     Ordered   10/04/21 0500  TSH  Tomorrow morning,   R        10/03/21 1924   10/03/21 1542  Urine Culture  Once,   URGENT       Question:  Indication  Answer:  Urgency/frequency   10/03/21 1541   10/03/21 1409  CBC with Differential/Platelet  Once,   R        10/03/21 1409   10/03/21 1333  Lactic acid, plasma  (Undifferentiated presentation (screening labs and basic nursing  orders))  STAT Now then every 2 hours,   R (with STAT occurrences)      10/03/21 1333   10/03/21 1333  Blood Culture (routine x 2)  (Undifferentiated presentation (screening labs and basic nursing orders))  BLOOD CULTURE  X 2,   STAT      10/03/21 1333   10/03/21 1238  CBC WITH DIFFERENTIAL  ONCE - STAT,   STAT        10/03/21 1240            Vitals/Pain Today's Vitals   10/03/21 1530 10/03/21 1715 10/03/21 1720 10/03/21 2007  BP: (!) 158/97  (!) 157/93   Pulse: 80  88   Resp: 15  17   Temp:      TempSrc:      SpO2: 98%  98%   PainSc:  2   10-Worst pain ever    Isolation Precautions No active isolations  Medications Medications  oxyCODONE (Oxy IR/ROXICODONE) immediate release tablet 5 mg (5 mg Oral Given 10/03/21 2007)  carvedilol (COREG) tablet 12.5 mg (has no administration in time range)  levothyroxine (SYNTHROID) tablet 112 mcg (has no administration in time range)  gabapentin (NEURONTIN) capsule 100 mg (has no administration in time range)  mometasone-formoterol (DULERA) 100-5 MCG/ACT inhaler 2 puff (has no administration in time range)  enoxaparin (LOVENOX) injection 40 mg (40 mg Subcutaneous Given 10/03/21 2008)  acetaminophen (TYLENOL) tablet 650 mg (has no administration in time range)    Or  acetaminophen (TYLENOL) suppository 650 mg (has no administration in time range)  docusate sodium (COLACE) capsule 100 mg (has no administration in time range)  ondansetron (ZOFRAN) tablet 4 mg ( Oral See Alternative 10/03/21 2010)    Or  ondansetron (ZOFRAN) injection 4 mg (4 mg Intravenous Given 10/03/21 2010)  cefTRIAXone (ROCEPHIN) 1 g in sodium chloride 0.9 % 100 mL IVPB (has no administration in time range)  doxycycline (VIBRAMYCIN) 100 mg in sodium chloride 0.9 % 250 mL IVPB (100 mg Intravenous New Bag/Given 10/03/21 1818)  lactated ringers bolus 250 mL (0 mLs Intravenous Stopped 10/03/21 1418)  sodium chloride (PF) 0.9 % injection (  Given 10/03/21 1513)  lactated ringers bolus  1,000 mL (0 mLs Intravenous Stopped 10/03/21 1722)  cefTRIAXone (ROCEPHIN) 1 g in sodium chloride 0.9 % 100 mL IVPB (0 g Intravenous Stopped 10/03/21 1709)  iohexol (OMNIPAQUE) 350 MG/ML injection 100 mL (100 mLs Intravenous Contrast Given 10/03/21 1602)  fentaNYL (SUBLIMAZE) injection 50 mcg (50 mcg Intravenous Given 10/03/21 1650)  metoCLOPramide (REGLAN) injection 5 mg (5 mg Intravenous Given 10/03/21 1650)    Mobility walks with device Low fall risk   Focused Assessments    R Recommendations: See Admitting Provider Note  Report given to:   Additional Notes:

## 2021-10-03 NOTE — H&P (Signed)
History and Physical    [name] LOV:[id number] DOB: Feb 25, 1929 DOA: 10/03/2021  I have briefly reviewed the patient's prior medical records in [place]  PCP: Carolee Rota, NP  Patient coming from: home  Chief Complaint: Intractable nausea and vomiting for 3 days  HPI: [name] is a 88 y.o. female with medical history significant of chronic diastolic CHF, nonischemic cardiomyopathy status post biventricular ICD, COPD with chronic hypoxic respiratory failure with home oxygen, hypothyroidism, HTN, HLD, dementia who comes into the hospital brought by family due to intractable nausea and vomiting for the past 3 days.  Patient has underlying dementia but can contribute a little bit to the story and tells me that she was unable to eat or drink anything in this amount of time.  She also reports intermittent low back pain.  She also complains of intermittent shortness of breath, but no cough, chest congestion, fever or chills.  She says that she has been having intermittent burning with urination as well.  Family not present at bedside and could not be reached via phone  ED Course: In the emergency room she is afebrile, slightly hypertensive, satting well on 3 L.  Blood work shows normal renal function, BNP is borderline high at 214, high-sensitivity troponin flat.  D-dimer was elevated and she underwent a CT angiogram which is negative for PE but had some findings concerning for pneumonia with subtle tree-in-bud opacity in the right upper and right middle lobes.  Urinalysis was grossly positive for pyuria and bacteria, she was given ceftriaxone and we are asked to admit for intractable nausea and vomiting.  Review of Systems: All systems reviewed, and apart from HPI, all negative  Past Medical History:  Diagnosis Date   AICD (automatic cardioverter/defibrillator) present 06/27/2007   medtronic   ARF (acute renal failure) ([place]) 09/2017   Cancer ([place])    ovarian--stage 4    Cardiomyopathy (HCC)    CHF (congestive heart failure) (HCC)    COPD (chronic obstructive pulmonary disease) ([place])    Dyspnea    Hypothyroidism    Peripheral neuropathy    Systemic hypertension    Thyroid disease     Past Surgical History:  Procedure Laterality Date   ABDOMINAL HYSTERECTOMY  oct 2000   along with ovarian ca surg   APPENDECTOMY     BIV PACEMAKER GENERATOR CHANGEOUT N/A 09/10/2019   Procedure: [place];  Surgeon: Sanda Klein, MD;  Location: [facility];  Service: Cardiovascular;  Laterality: N/A;   CARDIAC CATHETERIZATION  09/27/1990   normal coronaries,dilated cardiomyopathy   CARDIAC DEFIBRILLATOR PLACEMENT  06/27/2007   medtronic concerto   CHOLECYSTECTOMY     IMPLANTABLE CARDIOVERTER DEFIBRILLATOR GENERATOR CHANGE N/A 07/31/2013   Procedure: IMPLANTABLE CARDIOVERTER DEFIBRILLATOR GENERATOR CHANGE;  Surgeon: Sanda Klein, MD;  Location: [facility];  Service: Cardiovascular;  Laterality: N/A;   NM MYOCAR PERF WALL MOTION  06/05/2007   no ischemia     reports that she has never smoked. She has never used smokeless tobacco. She reports that she does not drink alcohol and does not use drugs.  Allergies  Allergen Reactions   Digitalis Other (See Comments)    Poisoned patient - patient was hospitalized    Family History  Problem Relation Age of Onset   Lung cancer Mother        died from cancer   Heart attack Father    Lung cancer Father    Heart disease Paternal Grandmother  Prior to Admission medications   Medication Sig Start Date End Date Taking? Authorizing Provider  aspirin EC 81 MG tablet Take 1 tablet (81 mg total) by mouth daily. Patient taking differently: Take 81 mg by mouth daily as needed (bloodthinner). 10/03/17   Croitoru, Mihai, MD  carvedilol (COREG) 12.5 MG tablet Take 1 tablet (12.5 mg total) by mouth in the morning and at bedtime. 06/24/21   Croitoru, Mihai, MD  Cholecalciferol (VITAMIN D3 PO) Take 2  tablets by mouth daily.    [provider]  docusate sodium (COLACE) 100 MG capsule Take 1 capsule (100 mg total) by mouth every 12 (twelve) hours. 01/12/21   Sherwood Gambler, MD  dorzolamide-timolol (COSOPT) 22.3-6.8 MG/ML ophthalmic solution Place 1 drop into both eyes 2 (two) times daily. 02/18/20   Shelly Coss, MD  DULERA 100-5 MCG/ACT AERO Inhale 2 puffs into the lungs every 6 (six) hours as needed for wheezing or shortness of breath.  08/29/19   [provider]  DULoxetine (CYMBALTA) 60 MG capsule Take 60 mg by mouth daily.  08/25/15   [provider]  furosemide (LASIX) 20 MG tablet Take 40 mg (two tablets) in the morning and 20 mg in the evening. 08/04/20   Croitoru, Mihai, MD  furosemide (LASIX) 40 MG tablet TAKE 1 TABLET BY MOUTH EVERY DAY 08/27/20   Croitoru, Mihai, MD  gabapentin (NEURONTIN) 100 MG capsule Take 100 mg by mouth 3 (three) times daily.    [provider]  latanoprost (XALATAN) 0.005 % ophthalmic solution Place 1 drop into both eyes at bedtime. 02/18/20   Shelly Coss, MD  liothyronine (CYTOMEL) 25 MCG tablet Take 25 mcg by mouth daily. 07/02/15   [provider]  oxyCODONE (ROXICODONE) 15 MG immediate release tablet Take 1 tablet (15 mg total) by mouth every 6 (six) hours as needed for pain. Patient taking differently: Take 15 mg by mouth in the morning, at noon, in the evening, and at bedtime. Takes at 0300 0900 1500 2100 02/17/20   Shelly Coss, MD  polyethylene glycol (MIRALAX / GLYCOLAX) 17 g packet Take 17 g by mouth daily. 01/12/21   Sherwood Gambler, MD  Prenatal Vit-Fe Fumarate-FA (PRENATAL VITAMINS PO) Take 1 tablet by mouth daily.    [provider]  SYNTHROID 112 MCG tablet Take 112 mcg by mouth daily before breakfast.  09/12/15   [provider]  traZODone (DESYREL) 50 MG tablet Take 50 mg by mouth at bedtime as needed for sleep.  01/10/20   [provider]  Zinc Oxide 10 % OINT Apply 1  application topically 4 (four) times daily as needed (bed sores).    [provider]    Physical Exam: Vitals:   10/03/21 1430 10/03/21 1452 10/03/21 1530 10/03/21 1720  BP: (!) 166/95  (!) 158/97 (!) 157/93  Pulse: 81  80 88  Resp: '13  15 17  '$ Temp:  99.7 F (37.6 C)    TempSrc:  Rectal    SpO2: 97%  98% 98%    Constitutional: NAD, calm, comfortable Eyes: PERRL, lids and conjunctivae normal ENMT: Mucous membranes are moist. Posterior pharynx clear of any exudate or lesions.Normal dentition.  Neck: normal, supple Respiratory: Overall diminished breath sounds, no wheezing.  Tachypneic at times Cardiovascular: Regular rate and rhythm, no murmurs / rubs / gallops.  Trace edema Abdomen: no tenderness, no masses palpated. Bowel sounds positive.  Musculoskeletal: no clubbing / cyanosis. Normal muscle tone.  Skin: no rashes, lesions, ulcers. No induration Neurologic: CN  2-12 grossly intact. Strength 5/5 in all 4.  Psychiatric: Normal judgment and insight.   Labs on Admission: I have personally reviewed following labs and imaging studies  CBC: No results for input(s): "WBC", "NEUTROABS", "HGB", "HCT", "MCV", "PLT" in the last 168 hours. Basic Metabolic Panel: Recent Labs  Lab 10/03/21 1238  NA 136  K 4.3  CL 97*  CO2 31  GLUCOSE 112*  BUN 21  CREATININE 0.81  CALCIUM 8.9  MG 2.3   Liver Function Tests: Recent Labs  Lab 10/03/21 1238  AST 30  ALT 20  ALKPHOS 45  BILITOT 0.7  PROT 7.1  ALBUMIN 3.5   Coagulation Profile: Recent Labs  Lab 10/03/21 1238  INR 1.0   BNP (last 3 results) No results for input(s): "PROBNP" in the last 8760 hours. CBG: No results for input(s): "GLUCAP" in the last 168 hours. Thyroid Function Tests: No results for input(s): "TSH", "T4TOTAL", "FREET4", "T3FREE", "THYROIDAB" in the last 72 hours. Urine analysis:    Component Value Date/Time   COLORURINE YELLOW 10/03/2021 1459   APPEARANCEUR CLOUDY (A) 10/03/2021 1459    LABSPEC 1.017 10/03/2021 1459   PHURINE 6.0 10/03/2021 1459   GLUCOSEU NEGATIVE 10/03/2021 1459   HGBUR SMALL (A) 10/03/2021 1459   BILIRUBINUR NEGATIVE 10/03/2021 1459   KETONESUR NEGATIVE 10/03/2021 1459   PROTEINUR 100 (A) 10/03/2021 1459   NITRITE NEGATIVE 10/03/2021 1459   LEUKOCYTESUR LARGE (A) 10/03/2021 1459     Radiological Exams on Admission: CT Angio Chest PE W and/or Wo Contrast  Result Date: 10/03/2021 CLINICAL DATA:  Suspected pulmonary embolus. Nausea vomiting and diarrhea.  Weakness. EXAM: CT ANGIOGRAPHY CHEST CT ABDOMEN AND PELVIS WITH CONTRAST TECHNIQUE: Multidetector CT imaging of the chest was performed using the standard protocol during bolus administration of intravenous contrast. Multiplanar CT image reconstructions and MIPs were obtained to evaluate the vascular anatomy. Multidetector CT imaging of the abdomen and pelvis was performed using the standard protocol during bolus administration of intravenous contrast. RADIATION DOSE REDUCTION: This exam was performed according to the departmental dose-optimization program which includes automated exposure control, adjustment of the mA and/or kV according to patient size and/or use of iterative reconstruction technique. CONTRAST:  178m OMNIPAQUE IOHEXOL 350 MG/ML SOLN COMPARISON:  Chest CT March 15, 2019 CT of the abdomen May 17, 2015 FINDINGS: CTA CHEST FINDINGS Cardiovascular: Satisfactory opacification of the pulmonary arteries to the segmental level. No evidence of pulmonary embolism. Pulmonary artery enlargement, usually associated with pulmonary arterial hypertension. Enlarged heart. Molar calcifications. Calcific atherosclerotic disease of the aorta. Mediastinum/Nodes: No enlarged mediastinal, hilar, or axillary lymph nodes. Thyroid gland, trachea, and esophagus demonstrate no significant findings. Lungs/Pleura: Small left pleural effusion. Left lower lobe atelectasis. Subtle tree in bud opacities in the right upper  lobe and right middle lobe, likely inflammatory. Musculoskeletal: No chest wall abnormality. No acute or significant osseous findings. Cardiac pacemaker noted. Review of the MIP images confirms the above findings. CT ABDOMEN and PELVIS FINDINGS Hepatobiliary: No focal liver abnormality is seen. Status post cholecystectomy. No biliary dilatation. Pancreas: Unremarkable. No pancreatic ductal dilatation or surrounding inflammatory changes. Spleen: Normal in size without focal abnormality. Adrenals/Urinary Tract: Prominent adrenal glands. Atrophic left kidney. Bilateral extrarenal pelvises. Normal urinary bladder. Stomach/Bowel: Stomach is within normal limits. No evidence of bowel distention, or inflammatory changes. Non specific diffuse mucosal thickening of the rectum. Vascular/Lymphatic: Aortic atherosclerosis. No enlarged abdominal or pelvic lymph nodes. Reproductive: Status post hysterectomy. No adnexal masses. Other: Left supraumbilical anterior abdominal wall hernia with wide neck  containing norm incarcerated bowel. A second narrower neck left anterior abdominal wall hernia, more inferiorly contains nonincarcerated small bowel. Musculoskeletal: Inferior endplate compression deformity of T12 vertebral body without unknown acuity 2. Multilevel degenerative changes of the lumbosacral spine. Has shaped scoliosis. Review of the MIP images confirms the above findings. IMPRESSION: 1. No evidence of pulmonary embolus. 2. Pulmonary artery enlargement, usually associated with pulmonary arterial hypertension. 3. Small left pleural effusion with left lower lobe atelectasis. 4. Subtle tree in bud opacities in the right upper lobe and right middle lobe, likely inflammatory or infectious. 5. No acute abnormalities in the abdomen or pelvis. 6. Atrophic left kidney. 7. Two left anterior abdominal wall hernias containing non incarcerated bowel. 8. Non specific diffuse mucosal thickening of the rectum. Please correlate with  patient's symptoms. 9. Age-indeterminate compression fracture of T12 vertebral body with approximately 50% height loss. Aortic Atherosclerosis (ICD10-I70.0). Electronically Signed   By: Fidela Salisbury M.D.   On: 10/03/2021 17:04   CT ABDOMEN PELVIS W CONTRAST  Result Date: 10/03/2021 CLINICAL DATA:  Nausea and vomiting.  Abdominal pain.  05/17/2015 EXAM: CT ABDOMEN AND PELVIS WITH CONTRAST TECHNIQUE: Multidetector CT imaging of the abdomen and pelvis was performed using the standard protocol following bolus administration of intravenous contrast. RADIATION DOSE REDUCTION: This exam was performed according to the departmental dose-optimization program which includes automated exposure control, adjustment of the mA and/or kV according to patient size and/or use of iterative reconstruction technique. CONTRAST:  175m OMNIPAQUE IOHEXOL 350 MG/ML SOLN COMPARISON:  None Available. FINDINGS: Lower chest: Mild collapse/consolidative opacity seen in the lingula and left lower lobe. Bronchiectasis with airway impaction is seen inferior right middle and lower lobes. 8 mm right lower lobe nodule seen on 30/6/8. 5 mm right lower lobe nodule seen on image 7/8. Hepatobiliary: No suspicious focal abnormality within the liver parenchyma. Gallbladder surgically absent. No intrahepatic or extrahepatic biliary dilation. Pancreas: Pancreatic parenchyma diffusely atrophic without main duct dilatation. Spleen: No splenomegaly. No focal mass lesion. Adrenals/Urinary Tract: Bilateral adrenal gland thickening without a discrete nodule or mass. Left kidney atrophic. Mild fullness noted right intrarenal collecting system with patulous renal pelvis. No evidence for hydroureter. Contrast material in the distal left ureter is likely from CTA chest performed earlier. The urinary bladder appears normal for the degree of distention. Stomach/Bowel: Stomach is nondistended. Mild wall thickening noted in the antral region, nonspecific  potentially related to peristalsis. Duodenum is normally positioned as is the ligament of Treitz. No small bowel wall thickening. No small bowel dilatation. Nonvisualization of the appendix is consistent with the reported history of appendectomy. No gross colonic mass. No colonic wall thickening. Vascular/Lymphatic: There is advanced atherosclerotic calcification of the abdominal aorta without aneurysm. There is no gastrohepatic or hepatoduodenal ligament lymphadenopathy. No retroperitoneal or mesenteric lymphadenopathy. No pelvic sidewall lymphadenopathy. Reproductive: The uterus is surgically absent. There is no adnexal mass. Other: No intraperitoneal free fluid. Musculoskeletal: No worrisome lytic or sclerotic osseous abnormality. Degenerative changes noted right hip with evidence of underlying avascular necrosis. Compression deformity noted at T12 with advanced degenerative changes in the lumbar spine. IMPRESSION: 1. No acute findings in the abdomen or pelvis. 2. Mild wall thickening in the antral region of the stomach, nonspecific and potentially related to peristalsis. Gastritis not excluded. 3. Left renal atrophy. 4. Bronchiectasis with airway impaction in the inferior right middle and lower lobes. 5. 8 mm right lower lobe pulmonary nodule with 5 mm right lower lobe pulmonary nodule. Non-contrast chest CT at 3-6 months is recommended.  If the nodules are stable at time of repeat CT, then future CT at 18-24 months (from today's scan) is considered optional for low-risk patients, but is recommended for high-risk patients. This recommendation follows the consensus statement: Guidelines for Management of Incidental Pulmonary Nodules Detected on CT Images: From the Fleischner Society 2017; Radiology 2017; 284:228-243. 6. Compression deformity at T12 with advanced degenerative changes in the lumbar spine. 7. Mild collapse/consolidative opacity seen in the lingula and left lower lobe. 8. Aortic Atherosclerosis  (ICD10-I70.0). Electronically Signed   By: Misty Stanley M.D.   On: 10/03/2021 16:57   CT Head Wo Contrast  Result Date: 10/03/2021 CLINICAL DATA:  Mental status changes. EXAM: CT HEAD WITHOUT CONTRAST TECHNIQUE: Contiguous axial images were obtained from the base of the skull through the vertex without intravenous contrast. RADIATION DOSE REDUCTION: This exam was performed according to the departmental dose-optimization program which includes automated exposure control, adjustment of the mA and/or kV according to patient size and/or use of iterative reconstruction technique. COMPARISON:  05/16/2020 FINDINGS: Brain: There is no evidence for acute hemorrhage, hydrocephalus, mass lesion, or abnormal extra-axial fluid collection. No definite CT evidence for acute infarction. Diffuse loss of parenchymal volume is consistent with atrophy. Patchy low attenuation in the deep hemispheric and periventricular white matter is nonspecific, but likely reflects chronic microvascular ischemic demyelination. Probable arachnoid cyst right middle cranial fossa is stable in the interval. Vascular: No hyperdense vessel or unexpected calcification. Skull: No evidence for fracture. No worrisome lytic or sclerotic lesion. Sinuses/Orbits: The visualized paranasal sinuses and mastoid air cells are clear. Visualized portions of the globes and intraorbital fat are unremarkable. Other: None. IMPRESSION: 1. No acute intracranial abnormality. 2. Atrophy with chronic small vessel ischemic disease. 3. Stable appearance arachnoid cyst anterior right middle cranial fossa. Electronically Signed   By: Misty Stanley M.D.   On: 10/03/2021 16:42   DG Chest Port 1 View  Result Date: 10/03/2021 CLINICAL DATA:  Nausea. EXAM: PORTABLE CHEST 1 VIEW COMPARISON:  None Available. FINDINGS: Mild ill-defined left basilar opacities. Otherwise, lungs are clear. Similar cardiomediastinal silhouette. Left subclavian approach cardiac rhythm maintenance device.  Limited visualization left lung base. No definite pleural effusion. No visible pneumothorax. IMPRESSION: Left basilar opacities, which could represent atelectasis, aspiration, and/or pneumonia. Electronically Signed   By: Margaretha Sheffield M.D.   On: 10/03/2021 13:40    EKG: Independently reviewed.  V paced rhythm  Assessment/Plan Principal problem Intractable nausea and vomiting, possibly due to urinary tract infection-UA is positive and she has symptoms with dysuria.  She was started on ceftriaxone, continue.  Continue to monitor urine cultures.  Allow clear liquid diet, advance as tolerated, provide antiemetics  Active problems COPD with chronic hypoxic respiratory failure-due to mild shortness of breath and chest x-ray findings, add azithromycin to cover potential respiratory illness  Hypothyroidism-check TSH as most recent 1 was slightly abnormal, continue Synthroid  Essential hypertension-awaiting medication reconciliation before resuming home meds  Chronic diastolic CHF-BNP slightly elevated, already received fluid in the ED, will not fluid resuscitate anymore.  Resume Lasix once medications are reconciled  Chronic pain-placed on low-dose oxycodone  Depression-resume Cymbalta when home meds are reconciled and dose verified   DVT prophylaxis: Lovenox  Code Status: DNR (durable yellow DNR form is at bedside) Family Communication: unable to reach family Disposition Plan: home vs SNF when ready  Bed Type: telemetry  Consults called: none  Obs/Inp: observation  Marzetta Board, MD, PhD [facility]  Contact via [email or web address]  10/03/2021, 5:38 PM

## 2021-10-03 NOTE — ED Notes (Signed)
Medtronic appliance charging at this time, pending interrogate.

## 2021-10-03 NOTE — ED Triage Notes (Addendum)
Pt BIB EMS from home, c/o N/V/D x 3 days. Increased weakness with abdominal pain. ABD pain reportedly comes and goes. Watery stool, multiple hernias, V-paced on telemetry. 22 gauge LH, received 8 mg Zofran with effective results  BP 170/81 70 v-paced 98% 3 liters CBG 141

## 2021-10-03 NOTE — ED Notes (Signed)
Interrogation attempted with no success.

## 2021-10-04 DIAGNOSIS — Z8041 Family history of malignant neoplasm of ovary: Secondary | ICD-10-CM | POA: Diagnosis not present

## 2021-10-04 DIAGNOSIS — R1111 Vomiting without nausea: Secondary | ICD-10-CM | POA: Diagnosis not present

## 2021-10-04 DIAGNOSIS — J440 Chronic obstructive pulmonary disease with acute lower respiratory infection: Secondary | ICD-10-CM | POA: Diagnosis present

## 2021-10-04 DIAGNOSIS — E039 Hypothyroidism, unspecified: Secondary | ICD-10-CM | POA: Diagnosis present

## 2021-10-04 DIAGNOSIS — Z7982 Long term (current) use of aspirin: Secondary | ICD-10-CM | POA: Diagnosis not present

## 2021-10-04 DIAGNOSIS — Z20822 Contact with and (suspected) exposure to covid-19: Secondary | ICD-10-CM | POA: Diagnosis present

## 2021-10-04 DIAGNOSIS — Z801 Family history of malignant neoplasm of trachea, bronchus and lung: Secondary | ICD-10-CM | POA: Diagnosis not present

## 2021-10-04 DIAGNOSIS — R112 Nausea with vomiting, unspecified: Secondary | ICD-10-CM | POA: Diagnosis not present

## 2021-10-04 DIAGNOSIS — G620 Drug-induced polyneuropathy: Secondary | ICD-10-CM | POA: Diagnosis present

## 2021-10-04 DIAGNOSIS — J189 Pneumonia, unspecified organism: Secondary | ICD-10-CM | POA: Diagnosis present

## 2021-10-04 DIAGNOSIS — Z888 Allergy status to other drugs, medicaments and biological substances status: Secondary | ICD-10-CM | POA: Diagnosis not present

## 2021-10-04 DIAGNOSIS — Z79899 Other long term (current) drug therapy: Secondary | ICD-10-CM | POA: Diagnosis not present

## 2021-10-04 DIAGNOSIS — I110 Hypertensive heart disease with heart failure: Secondary | ICD-10-CM | POA: Diagnosis present

## 2021-10-04 DIAGNOSIS — Z8249 Family history of ischemic heart disease and other diseases of the circulatory system: Secondary | ICD-10-CM | POA: Diagnosis not present

## 2021-10-04 DIAGNOSIS — I428 Other cardiomyopathies: Secondary | ICD-10-CM | POA: Diagnosis present

## 2021-10-04 DIAGNOSIS — R110 Nausea: Secondary | ICD-10-CM | POA: Diagnosis not present

## 2021-10-04 DIAGNOSIS — Z79890 Hormone replacement therapy (postmenopausal): Secondary | ICD-10-CM | POA: Diagnosis not present

## 2021-10-04 DIAGNOSIS — R1084 Generalized abdominal pain: Secondary | ICD-10-CM | POA: Diagnosis not present

## 2021-10-04 DIAGNOSIS — Z95810 Presence of automatic (implantable) cardiac defibrillator: Secondary | ICD-10-CM | POA: Diagnosis not present

## 2021-10-04 DIAGNOSIS — E785 Hyperlipidemia, unspecified: Secondary | ICD-10-CM | POA: Diagnosis present

## 2021-10-04 DIAGNOSIS — R0602 Shortness of breath: Secondary | ICD-10-CM | POA: Diagnosis present

## 2021-10-04 DIAGNOSIS — N390 Urinary tract infection, site not specified: Secondary | ICD-10-CM | POA: Diagnosis present

## 2021-10-04 DIAGNOSIS — Z66 Do not resuscitate: Secondary | ICD-10-CM | POA: Diagnosis present

## 2021-10-04 DIAGNOSIS — G894 Chronic pain syndrome: Secondary | ICD-10-CM | POA: Diagnosis present

## 2021-10-04 DIAGNOSIS — Z7951 Long term (current) use of inhaled steroids: Secondary | ICD-10-CM | POA: Diagnosis not present

## 2021-10-04 DIAGNOSIS — J9611 Chronic respiratory failure with hypoxia: Secondary | ICD-10-CM | POA: Diagnosis present

## 2021-10-04 DIAGNOSIS — F0393 Unspecified dementia, unspecified severity, with mood disturbance: Secondary | ICD-10-CM | POA: Diagnosis present

## 2021-10-04 DIAGNOSIS — T451X5A Adverse effect of antineoplastic and immunosuppressive drugs, initial encounter: Secondary | ICD-10-CM | POA: Diagnosis present

## 2021-10-04 DIAGNOSIS — I5032 Chronic diastolic (congestive) heart failure: Secondary | ICD-10-CM | POA: Diagnosis present

## 2021-10-04 DIAGNOSIS — Z7401 Bed confinement status: Secondary | ICD-10-CM | POA: Diagnosis not present

## 2021-10-04 LAB — TSH: TSH: 80.212 u[IU]/mL — ABNORMAL HIGH (ref 0.350–4.500)

## 2021-10-04 MED ORDER — POLYETHYLENE GLYCOL 3350 17 G PO PACK: 17.0000 g | PACK | Freq: Every day | ORAL | Status: DC | PRN

## 2021-10-04 MED ORDER — OXYCODONE HCL 5 MG PO TABS
10.0000 mg | ORAL_TABLET | Freq: Four times a day (QID) | ORAL | Status: DC | PRN
  Administered 2021-10-04 – 2021-10-06 (×5): 10 mg via ORAL
  Filled 2021-10-04 (×5): qty 2

## 2021-10-04 MED ORDER — ORAL CARE MOUTH RINSE: 15.0000 mL | OROMUCOSAL | Status: DC | PRN

## 2021-10-04 MED ORDER — ALBUTEROL SULFATE (2.5 MG/3ML) 0.083% IN NEBU
2.5000 mg | INHALATION_SOLUTION | RESPIRATORY_TRACT | Status: DC | PRN
  Administered 2021-10-06: 2.5 mg via RESPIRATORY_TRACT
  Filled 2021-10-04 (×2): qty 3

## 2021-10-04 MED ORDER — TRAZODONE HCL 50 MG PO TABS
50.0000 mg | ORAL_TABLET | Freq: Every evening | ORAL | Status: DC | PRN
Start: 2021-10-04 — End: 2021-10-06
  Administered 2021-10-04: 50 mg via ORAL
  Filled 2021-10-04: qty 1

## 2021-10-04 MED ORDER — LATANOPROST 0.005 % OP SOLN
1.0000 [drp] | Freq: Every day | OPHTHALMIC | Status: DC
  Administered 2021-10-04 – 2021-10-05 (×2): 1 [drp] via OPHTHALMIC
  Filled 2021-10-04: qty 2.5

## 2021-10-04 MED ORDER — ZINC OXIDE 10 % EX OINT: 1.0000 "application " | TOPICAL_OINTMENT | Freq: Four times a day (QID) | CUTANEOUS | Status: DC | PRN

## 2021-10-04 MED ORDER — ASPIRIN 81 MG PO TBEC: 81.0000 mg | DELAYED_RELEASE_TABLET | Freq: Every day | ORAL | Status: DC

## 2021-10-04 MED ORDER — DORZOLAMIDE HCL-TIMOLOL MAL 2-0.5 % OP SOLN
1.0000 [drp] | Freq: Two times a day (BID) | OPHTHALMIC | Status: DC
  Administered 2021-10-04 – 2021-10-06 (×5): 1 [drp] via OPHTHALMIC
  Filled 2021-10-04: qty 10

## 2021-10-04 MED ORDER — LEVOTHYROXINE SODIUM 50 MCG PO TABS
150.0000 ug | ORAL_TABLET | Freq: Every day | ORAL | Status: DC
  Administered 2021-10-05: 150 ug via ORAL
  Filled 2021-10-04: qty 1

## 2021-10-04 MED ORDER — FUROSEMIDE 40 MG PO TABS
40.0000 mg | ORAL_TABLET | Freq: Every day | ORAL | Status: DC
  Administered 2021-10-04 – 2021-10-06 (×3): 40 mg via ORAL
  Filled 2021-10-04 (×3): qty 1

## 2021-10-04 MED ORDER — DULOXETINE HCL 60 MG PO CPEP
60.0000 mg | ORAL_CAPSULE | Freq: Every day | ORAL | Status: DC
  Administered 2021-10-04 – 2021-10-06 (×3): 60 mg via ORAL
  Filled 2021-10-04 (×3): qty 1

## 2021-10-04 NOTE — Progress Notes (Signed)
PROGRESS NOTE  [name] CWC:[id number] DOB: September 22, 1928 DOA: 10/03/2021 PCP: Carolee Rota, NP   LOS: 0 days   Brief Narrative / Interim history: 88 y.o. female with medical history significant of chronic diastolic CHF, nonischemic cardiomyopathy status post biventricular ICD, COPD with chronic hypoxic respiratory failure with home oxygen, hypothyroidism, HTN, HLD, dementia who comes into the hospital brought by family due to intractable nausea and vomiting for the past 3 days.  Patient has underlying dementia but can contribute a little bit to the story and tells me that she was unable to eat or drink anything in this amount of time.   Subjective / 24h Interval events: Feels like her nausea is easing up some, still complains of lower abdominal cramping as well as lower back pain. Breathing seems to be a little bit better  Assesement and Plan: Principal Problem:   Intractable nausea and vomiting Active Problems:   DOE (dyspnea on exertion)   COPD (chronic obstructive pulmonary disease) (HCC)   Nonischemic cardiomyopathy (HCC)   Biventricular ICD    Chronic diastolic heart failure (HCC)   Peripheral neuropathy secondary to chemotherapy   Hyperlipidemia   Hypothyroidism   Essential hypertension   Chronic pain syndrome  Principal problem Intractable nausea and vomiting, possibly due to urinary tract infection vs profound hypothyroidism-UA is positive and she has symptoms with dysuria.  She was started on ceftriaxone, continue.  Continue to monitor cultures and IV antibiotics.  Nausea is a little better, continue supportive care with antiemetics, advance diet as tolerated.    Active problems COPD with chronic hypoxic respiratory failure-due to mild shortness of breath and chest x-ray findings, azithromycin was added to cover potential respiratory illness.  Breathing a little bit better today.   Profound hypothyroidism-TSH was found to be elevated 80, which may also explain her  abdominal complaints.  She appears to be on Synthroid 112/day.  She also has Cytomel 25 listed but it does not appear that she is taking this.  She reports that she takes the thyroid supplements on an empty stomach, before all other meds which sounds appropriate.  Needs dose increase, however need to touch base with daughter to verify dose, actual meds, administration.  Unable to reach her this morning   Essential hypertension-continue Coreg, furosemide  Nonischemic cardiomyopathy-appears stable, continue aspirin, Coreg  Chronic diastolic CHF-BNP slightly elevated, already received fluid in the ED, will not fluid resuscitate anymore.  Resume Lasix this morning   Chronic pain-continue home oxycodone  Depression-resume Cymbalta when home meds are reconciled and dose verified  Scheduled Meds:  aspirin EC  81 mg Oral Daily   carvedilol  12.5 mg Oral BID WC   docusate sodium  100 mg Oral BID   dorzolamide-timolol  1 drop Both Eyes BID   DULoxetine  60 mg Oral Daily   enoxaparin (LOVENOX) injection  40 mg Subcutaneous Q24H   furosemide  40 mg Oral Daily   gabapentin  100 mg Oral TID   latanoprost  1 drop Both Eyes QHS   levothyroxine  112 mcg Oral Q0600   mometasone-formoterol  2 puff Inhalation BID   polyethylene glycol  17 g Oral Daily   Continuous Infusions:  cefTRIAXone (ROCEPHIN)  IV 1 g (10/04/21 0843)   doxycycline (VIBRAMYCIN) IV 100 mg (10/04/21 0522)   PRN Meds:.acetaminophen **OR** acetaminophen, albuterol, ondansetron **OR** ondansetron (ZOFRAN) IV, mouth rinse, oxyCODONE  Diet Orders (From admission, onward)     Start     Ordered   10/04/21 7616  Diet regular Room service appropriate? Yes; Fluid consistency: Thin  Diet effective now       Question Answer Comment  Room service appropriate? Yes   Fluid consistency: Thin      10/04/21 0751            DVT prophylaxis: enoxaparin (LOVENOX) injection 40 mg Start: 10/03/21 2000   Lab Results  Component Value Date    PLT 102 (L) 10/03/2021      Code Status: DNR  Family Communication: no family at bedside. Unable to reach daughter Arrie Aran  Status is: Observation  The patient will require care spanning > 2 midnights and should be moved to inpatient because: nausea/abdominal pain   Level of care: Telemetry  Consultants:  none  Objective: Vitals:   10/03/21 2134 10/03/21 2316 10/04/21 0130 10/04/21 0548  BP: (!) 177/89  (!) 159/89 (!) 161/82  Pulse: 84  82 75  Resp: (!) 22   20  Temp: 98.4 F (36.9 C)  98.6 F (37 C) 98 F (36.7 C)  TempSrc: Oral  Oral Oral  SpO2: 97% 98% 95% 98%  Weight: 63.3 kg     Height: '5\' 3"'$  (1.6 m)       Intake/Output Summary (Last 24 hours) at 10/04/2021 1039 Last data filed at 10/04/2021 6948 Gross per 24 hour  Intake 1735.83 ml  Output --  Net 1735.83 ml   Wt Readings from Last 3 Encounters:  10/03/21 63.3 kg  03/11/21 65.8 kg  01/12/21 65.8 kg    Examination:  Constitutional: NAD Eyes: no scleral icterus ENMT: Mucous membranes are moist.  Neck: normal, supple Respiratory: clear to auscultation bilaterally, no wheezing, no crackles. Normal respiratory effort. \ Cardiovascular: Regular rate and rhythm, no murmurs / rubs / gallops. No LE edema.  Abdomen: non distended, no tenderness. Bowel sounds positive.  Musculoskeletal: no clubbing / cyanosis.  Skin: no rashes Neurologic: non focal   Data Reviewed: I have independently reviewed following labs and imaging studies  CBC Recent Labs  Lab 10/03/21 1652  WBC 4.7  HGB 16.9*  HCT 51.9*  PLT 102*  MCV 95.8  MCH 31.2  MCHC 32.6  RDW 13.5  LYMPHSABS 0.9  MONOABS 0.3  EOSABS 0.2  BASOSABS 0.1    Recent Labs  Lab 10/03/21 1238 10/03/21 1239 10/03/21 1333 10/03/21 2206 10/04/21 0341  NA 136  --   --   --   --   K 4.3  --   --   --   --   CL 97*  --   --   --   --   CO2 31  --   --   --   --   GLUCOSE 112*  --   --   --   --   BUN 21  --   --   --   --   CREATININE 0.81  --   --    --   --   CALCIUM 8.9  --   --   --   --   AST 30  --   --   --   --   ALT 20  --   --   --   --   ALKPHOS 45  --   --   --   --   BILITOT 0.7  --   --   --   --   ALBUMIN 3.5  --   --   --   --   MG 2.3  --   --   --   --  DDIMER 2.10*  --   --   --   --   LATICACIDVEN  --   --  0.7 0.9  --   INR 1.0  --   --   --   --   TSH  --   --   --   --  80.212*  BNP  --  214.4*  --   --   --     ------------------------------------------------------------------------------------------------------------------ No results for input(s): "CHOL", "HDL", "LDLCALC", "TRIG", "CHOLHDL", "LDLDIRECT" in the last 72 hours.  Lab Results  Component Value Date   HGBA1C 5.4 09/30/2015   ------------------------------------------------------------------------------------------------------------------ Recent Labs    10/04/21 0341  TSH 80.212*    Cardiac Enzymes No results for input(s): "CKMB", "TROPONINI", "MYOGLOBIN" in the last 168 hours.  Invalid input(s): "CK" ------------------------------------------------------------------------------------------------------------------    Component Value Date/Time   BNP 214.4 (H) 10/03/2021 1239    CBG: No results for input(s): "GLUCAP" in the last 168 hours.  Recent Results (from the past 240 hour(s))  Blood Culture (routine x 2)     Status: None (Preliminary result)   Collection Time: 10/03/21  1:33 PM   Specimen: BLOOD  Result Value Ref Range Status   Specimen Description   Final    BLOOD BLOOD RIGHT FOREARM Performed at [facility] [address], [place], [place] [address]    Special Requests   Final    BOTTLES DRAWN AEROBIC AND ANAEROBIC Blood Culture results may not be optimal due to an inadequate volume of blood received in culture bottles Performed at [facility] [address], [place], [place] [address]    Culture   Final    NO GROWTH < 12 HOURS Performed at [facility]  [address], [place], [place] [address]    Report Status PENDING  Incomplete  Blood Culture (routine x 2)     Status: None (Preliminary result)   Collection Time: 10/03/21  1:38 PM   Specimen: BLOOD  Result Value Ref Range Status   Specimen Description   Final    BLOOD BLOOD LEFT FOREARM Performed at [facility] [address], [place], [place] [address]    Special Requests   Final    BOTTLES DRAWN AEROBIC ONLY Blood Culture results may not be optimal due to an inadequate volume of blood received in culture bottles Performed at [facility] [address], [place], [place] [address]    Culture   Final    NO GROWTH < 12 HOURS Performed at [facility] [address], [place], [place] [address]    Report Status PENDING  Incomplete  SARS Coronavirus 2 by RT PCR (hospital order, performed in [facility] hospital lab) *cepheid single result test* Anterior Nasal Swab     Status: None   Collection Time: 10/03/21  5:55 PM   Specimen: Anterior Nasal Swab  Result Value Ref Range Status   SARS Coronavirus 2 by RT PCR NEGATIVE NEGATIVE Final    Comment: (NOTE) SARS-CoV-2 target nucleic acids are NOT DETECTED.  The SARS-CoV-2 RNA is generally detectable in upper and lower respiratory specimens during the acute phase of infection. The lowest concentration of SARS-CoV-2 viral copies this assay can detect is 250 copies / mL. A negative result does not preclude SARS-CoV-2 infection and should not be used as the sole basis for treatment or other patient management decisions.  A negative result may occur with improper specimen collection / handling, submission  of specimen other than nasopharyngeal swab, presence of viral mutation(s) within the areas targeted by this assay, and inadequate number of viral copies (<250 copies / mL). A negative result must be combined with clinical observations, patient history, and epidemiological information.  Fact Sheet for  Patients:   https://www.patel.info/  Fact Sheet for Healthcare Providers: https://hall.com/  This test is not yet approved or  cleared by the [place] FDA and has been authorized for detection and/or diagnosis of SARS-CoV-2 by FDA under an Emergency Use Authorization (EUA).  This EUA will remain in effect (meaning this test can be used) for the duration of the COVID-19 declaration under Section 564(b)(1) of the Act, 21 U.S.C. section 360bbb-3(b)(1), unless the authorization is terminated or revoked sooner.  Performed at [facility] [place], [place] [address] [place], [place] [address]      Radiology Studies: CT Angio Chest PE W and/or Wo Contrast  Result Date: 10/03/2021 CLINICAL DATA:  Suspected pulmonary embolus. Nausea vomiting and diarrhea.  Weakness. EXAM: CT ANGIOGRAPHY CHEST CT ABDOMEN AND PELVIS WITH CONTRAST TECHNIQUE: Multidetector CT imaging of the chest was performed using the standard protocol during bolus administration of intravenous contrast. Multiplanar CT image reconstructions and MIPs were obtained to evaluate the vascular anatomy. Multidetector CT imaging of the abdomen and pelvis was performed using the standard protocol during bolus administration of intravenous contrast. RADIATION DOSE REDUCTION: This exam was performed according to the departmental dose-optimization program which includes automated exposure control, adjustment of the mA and/or kV according to patient size and/or use of iterative reconstruction technique. CONTRAST:  135m OMNIPAQUE IOHEXOL 350 MG/ML SOLN COMPARISON:  Chest CT March 15, 2019 CT of the abdomen May 17, 2015 FINDINGS: CTA CHEST FINDINGS Cardiovascular: Satisfactory opacification of the pulmonary arteries to the segmental level. No evidence of pulmonary embolism. Pulmonary artery enlargement, usually associated with pulmonary arterial hypertension. Enlarged heart. Molar  calcifications. Calcific atherosclerotic disease of the aorta. Mediastinum/Nodes: No enlarged mediastinal, hilar, or axillary lymph nodes. Thyroid gland, trachea, and esophagus demonstrate no significant findings. Lungs/Pleura: Small left pleural effusion. Left lower lobe atelectasis. Subtle tree in bud opacities in the right upper lobe and right middle lobe, likely inflammatory. Musculoskeletal: No chest wall abnormality. No acute or significant osseous findings. Cardiac pacemaker noted. Review of the MIP images confirms the above findings. CT ABDOMEN and PELVIS FINDINGS Hepatobiliary: No focal liver abnormality is seen. Status post cholecystectomy. No biliary dilatation. Pancreas: Unremarkable. No pancreatic ductal dilatation or surrounding inflammatory changes. Spleen: Normal in size without focal abnormality. Adrenals/Urinary Tract: Prominent adrenal glands. Atrophic left kidney. Bilateral extrarenal pelvises. Normal urinary bladder. Stomach/Bowel: Stomach is within normal limits. No evidence of bowel distention, or inflammatory changes. Non specific diffuse mucosal thickening of the rectum. Vascular/Lymphatic: Aortic atherosclerosis. No enlarged abdominal or pelvic lymph nodes. Reproductive: Status post hysterectomy. No adnexal masses. Other: Left supraumbilical anterior abdominal wall hernia with wide neck containing norm incarcerated bowel. A second narrower neck left anterior abdominal wall hernia, more inferiorly contains nonincarcerated small bowel. Musculoskeletal: Inferior endplate compression deformity of T12 vertebral body without unknown acuity 2. Multilevel degenerative changes of the lumbosacral spine. Has shaped scoliosis. Review of the MIP images confirms the above findings. IMPRESSION: 1. No evidence of pulmonary embolus. 2. Pulmonary artery enlargement, usually associated with pulmonary arterial hypertension. 3. Small left pleural effusion with left lower lobe atelectasis. 4. Subtle tree in bud  opacities in the right upper lobe and right middle lobe, likely inflammatory or infectious. 5. No acute abnormalities in the abdomen  or pelvis. 6. Atrophic left kidney. 7. Two left anterior abdominal wall hernias containing non incarcerated bowel. 8. Non specific diffuse mucosal thickening of the rectum. Please correlate with patient's symptoms. 9. Age-indeterminate compression fracture of T12 vertebral body with approximately 50% height loss. Aortic Atherosclerosis (ICD10-I70.0). Electronically Signed   By: Fidela Salisbury M.D.   On: 10/03/2021 17:04   CT ABDOMEN PELVIS W CONTRAST  Result Date: 10/03/2021 CLINICAL DATA:  Nausea and vomiting.  Abdominal pain.  05/17/2015 EXAM: CT ABDOMEN AND PELVIS WITH CONTRAST TECHNIQUE: Multidetector CT imaging of the abdomen and pelvis was performed using the standard protocol following bolus administration of intravenous contrast. RADIATION DOSE REDUCTION: This exam was performed according to the departmental dose-optimization program which includes automated exposure control, adjustment of the mA and/or kV according to patient size and/or use of iterative reconstruction technique. CONTRAST:  177m OMNIPAQUE IOHEXOL 350 MG/ML SOLN COMPARISON:  None Available. FINDINGS: Lower chest: Mild collapse/consolidative opacity seen in the lingula and left lower lobe. Bronchiectasis with airway impaction is seen inferior right middle and lower lobes. 8 mm right lower lobe nodule seen on 30/6/8. 5 mm right lower lobe nodule seen on image 7/8. Hepatobiliary: No suspicious focal abnormality within the liver parenchyma. Gallbladder surgically absent. No intrahepatic or extrahepatic biliary dilation. Pancreas: Pancreatic parenchyma diffusely atrophic without main duct dilatation. Spleen: No splenomegaly. No focal mass lesion. Adrenals/Urinary Tract: Bilateral adrenal gland thickening without a discrete nodule or mass. Left kidney atrophic. Mild fullness noted right intrarenal collecting  system with patulous renal pelvis. No evidence for hydroureter. Contrast material in the distal left ureter is likely from CTA chest performed earlier. The urinary bladder appears normal for the degree of distention. Stomach/Bowel: Stomach is nondistended. Mild wall thickening noted in the antral region, nonspecific potentially related to peristalsis. Duodenum is normally positioned as is the ligament of Treitz. No small bowel wall thickening. No small bowel dilatation. Nonvisualization of the appendix is consistent with the reported history of appendectomy. No gross colonic mass. No colonic wall thickening. Vascular/Lymphatic: There is advanced atherosclerotic calcification of the abdominal aorta without aneurysm. There is no gastrohepatic or hepatoduodenal ligament lymphadenopathy. No retroperitoneal or mesenteric lymphadenopathy. No pelvic sidewall lymphadenopathy. Reproductive: The uterus is surgically absent. There is no adnexal mass. Other: No intraperitoneal free fluid. Musculoskeletal: No worrisome lytic or sclerotic osseous abnormality. Degenerative changes noted right hip with evidence of underlying avascular necrosis. Compression deformity noted at T12 with advanced degenerative changes in the lumbar spine. IMPRESSION: 1. No acute findings in the abdomen or pelvis. 2. Mild wall thickening in the antral region of the stomach, nonspecific and potentially related to peristalsis. Gastritis not excluded. 3. Left renal atrophy. 4. Bronchiectasis with airway impaction in the inferior right middle and lower lobes. 5. 8 mm right lower lobe pulmonary nodule with 5 mm right lower lobe pulmonary nodule. Non-contrast chest CT at 3-6 months is recommended. If the nodules are stable at time of repeat CT, then future CT at 18-24 months (from today's scan) is considered optional for low-risk patients, but is recommended for high-risk patients. This recommendation follows the consensus statement: Guidelines for Management  of Incidental Pulmonary Nodules Detected on CT Images: From the Fleischner Society 2017; Radiology 2017; 284:228-243. 6. Compression deformity at T12 with advanced degenerative changes in the lumbar spine. 7. Mild collapse/consolidative opacity seen in the lingula and left lower lobe. 8. Aortic Atherosclerosis (ICD10-I70.0). Electronically Signed   By: EMisty StanleyM.D.   On: 10/03/2021 16:57   CT Head Wo  Contrast  Result Date: 10/03/2021 CLINICAL DATA:  Mental status changes. EXAM: CT HEAD WITHOUT CONTRAST TECHNIQUE: Contiguous axial images were obtained from the base of the skull through the vertex without intravenous contrast. RADIATION DOSE REDUCTION: This exam was performed according to the departmental dose-optimization program which includes automated exposure control, adjustment of the mA and/or kV according to patient size and/or use of iterative reconstruction technique. COMPARISON:  05/16/2020 FINDINGS: Brain: There is no evidence for acute hemorrhage, hydrocephalus, mass lesion, or abnormal extra-axial fluid collection. No definite CT evidence for acute infarction. Diffuse loss of parenchymal volume is consistent with atrophy. Patchy low attenuation in the deep hemispheric and periventricular white matter is nonspecific, but likely reflects chronic microvascular ischemic demyelination. Probable arachnoid cyst right middle cranial fossa is stable in the interval. Vascular: No hyperdense vessel or unexpected calcification. Skull: No evidence for fracture. No worrisome lytic or sclerotic lesion. Sinuses/Orbits: The visualized paranasal sinuses and mastoid air cells are clear. Visualized portions of the globes and intraorbital fat are unremarkable. Other: None. IMPRESSION: 1. No acute intracranial abnormality. 2. Atrophy with chronic small vessel ischemic disease. 3. Stable appearance arachnoid cyst anterior right middle cranial fossa. Electronically Signed   By: Misty Stanley M.D.   On: 10/03/2021 16:42    DG Chest Port 1 View  Result Date: 10/03/2021 CLINICAL DATA:  Nausea. EXAM: PORTABLE CHEST 1 VIEW COMPARISON:  None Available. FINDINGS: Mild ill-defined left basilar opacities. Otherwise, lungs are clear. Similar cardiomediastinal silhouette. Left subclavian approach cardiac rhythm maintenance device. Limited visualization left lung base. No definite pleural effusion. No visible pneumothorax. IMPRESSION: Left basilar opacities, which could represent atelectasis, aspiration, and/or pneumonia. Electronically Signed   By: Margaretha Sheffield M.D.   On: 10/03/2021 13:40     Marzetta Board, MD, PhD [facility]  Between 7 am - 7 pm I am available, please contact me via Amion (for emergencies) or Securechat (non urgent messages)  Between 7 pm - 7 am I am not available, please contact night coverage MD/APP via Amion

## 2021-10-05 DIAGNOSIS — R112 Nausea with vomiting, unspecified: Secondary | ICD-10-CM | POA: Diagnosis not present

## 2021-10-05 LAB — BASIC METABOLIC PANEL
Anion gap: 6 (ref 5–15)
BUN: 24 mg/dL — ABNORMAL HIGH (ref 8–23)
CO2: 33 mmol/L — ABNORMAL HIGH (ref 22–32)
Calcium: 8.2 mg/dL — ABNORMAL LOW (ref 8.9–10.3)
Chloride: 97 mmol/L — ABNORMAL LOW (ref 98–111)
Creatinine, Ser: 1.21 mg/dL — ABNORMAL HIGH (ref 0.44–1.00)
GFR, Estimated: 42 mL/min — ABNORMAL LOW (ref >60)
Glucose, Bld: 96 mg/dL (ref 70–99)
Potassium: 4.8 mmol/L (ref 3.5–5.1)
Sodium: 136 mmol/L (ref 135–145)

## 2021-10-05 LAB — CBC
HCT: 37.1 % (ref 36.0–46.0)
Hemoglobin: 11.7 g/dL — ABNORMAL LOW (ref 12.0–15.0)
MCH: 31.6 pg (ref 26.0–34.0)
MCHC: 31.5 g/dL (ref 30.0–36.0)
MCV: 100.3 fL — ABNORMAL HIGH (ref 80.0–100.0)
Platelets: 127 10*3/uL — ABNORMAL LOW (ref 150–400)
RBC: 3.7 MIL/uL — ABNORMAL LOW (ref 3.87–5.11)
RDW: 13.7 % (ref 11.5–15.5)
WBC: 4.8 10*3/uL (ref 4.0–10.5)
nRBC: 0 % (ref 0.0–0.2)

## 2021-10-05 LAB — URINE CULTURE: Culture: >=100000 — AB

## 2021-10-05 MED ORDER — LEVOTHYROXINE SODIUM 112 MCG PO TABS
112.0000 ug | ORAL_TABLET | Freq: Every day | ORAL | Status: DC
  Administered 2021-10-06: 112 ug via ORAL
  Filled 2021-10-05: qty 1

## 2021-10-05 MED ORDER — ENOXAPARIN SODIUM 30 MG/0.3ML IJ SOSY
30.0000 mg | PREFILLED_SYRINGE | INTRAMUSCULAR | Status: DC
  Administered 2021-10-05: 30 mg via SUBCUTANEOUS
  Filled 2021-10-05: qty 0.3

## 2021-10-05 NOTE — TOC Initial Note (Signed)
Transition of Care [facility]) - Initial/Assessment Note    Patient Details  Name: [name] MRN: [id number] Date of Birth: [birth year]  Transition of Care [facility]) CM/SW Contact:    Leeroy Cha, RN Phone Number: 10/05/2021, 12:13 PM  Clinical Narrative:                  Transition of Care [facility]) Screening Note   Patient Details  Name: [name] Date of Birth: [birth year]   Transition of Care [facility]) CM/SW Contact:    Leeroy Cha, RN Phone Number: 10/05/2021, 12:13 PM    Transition of Care Department [facility]) has reviewed patient and no TOC needs have been identified at this time. We will continue to monitor patient advancement through interdisciplinary progression rounds. If new patient transition needs arise, please place a TOC consult. '  Expected Discharge Plan: Home/Self Care Barriers to Discharge: Continued Medical Work up   Patient Goals and CMS Choice Patient states their goals for this hospitalization and ongoing recovery are:: to go home CMS Medicare.gov Compare Post Acute Care list provided to:: Patient    Expected Discharge Plan and Services Expected Discharge Plan: Home/Self Care   Discharge Planning Services: CM Consult   Living arrangements for the past 2 months: Single Family Home                                      Prior Living Arrangements/Services Living arrangements for the past 2 months: Single Family Home Lives with:: Self Patient language and need for interpreter reviewed:: Yes Do you feel safe going back to the place where you live?: Yes            Criminal Activity/Legal Involvement Pertinent to Current Situation/Hospitalization: No - Comment as needed  Activities of Daily Living Home Assistive Devices/Equipment: Eyeglasses, Dentures (specify type), Oxygen, Wheelchair ADL Screening (condition at time of admission) Patient's cognitive ability adequate to safely complete daily activities?: Yes Is the patient deaf or  have difficulty hearing?: No Does the patient have difficulty seeing, even when wearing glasses/contacts?: No Does the patient have difficulty concentrating, remembering, or making decisions?: Yes Patient able to express need for assistance with ADLs?: Yes Does the patient have difficulty dressing or bathing?: Yes Independently performs ADLs?: No Communication: Independent Dressing (OT): Needs assistance Is this a change from baseline?: Pre-admission baseline Grooming: Needs assistance Is this a change from baseline?: Pre-admission baseline Feeding: Independent Bathing: Needs assistance Is this a change from baseline?: Pre-admission baseline Toileting: Needs assistance Is this a change from baseline?: Pre-admission baseline In/Out Bed: Needs assistance Is this a change from baseline?: Pre-admission baseline Walks in Home: Dependent Is this a change from baseline?: Pre-admission baseline Does the patient have difficulty walking or climbing stairs?: Yes Weakness of Legs: Both Weakness of Arms/Hands: None  Permission Sought/Granted                  Emotional Assessment Appearance:: Appears stated age     Orientation: : Oriented to Self, Oriented to Place, Oriented to  Time, Oriented to Situation Alcohol / Substance Use: Not Applicable Psych Involvement: No (comment)  Admission diagnosis:  Shortness of breath [R06.02] Intractable nausea and vomiting [R11.2] Nausea and vomiting, unspecified vomiting type [R11.2] Severe hypothyroidism [E03.9] Patient Active Problem List   Diagnosis Date Noted   Severe hypothyroidism 10/04/2021   Intractable nausea and vomiting 10/03/2021   Frequent falls 05/17/2020  Macrocytosis 05/17/2020   Chronic pain syndrome 05/17/2020   Acute on chronic heart failure with preserved ejection fraction (HFpEF) ([place]) 02/11/2020   Essential hypertension 02/11/2020   Fracture of left ankle, closed, initial encounter 02/11/2020   Pacemaker 02/11/2020    Fall at home, initial encounter 02/11/2020   Prolonged QT interval 02/11/2020   ICD (implantable cardioverter-defibrillator) battery depletion    Pacemaker battery depletion 09/09/2019   Acute respiratory failure ([place]) 03/15/2019   COPD exacerbation ([place]) 03/15/2019   Acute respiratory failure with hypoxia ([place]) 03/14/2019   COPD with acute exacerbation ([place]) 03/14/2019   Digoxin toxicity 10/04/2017   Closed fracture of distal end of radius 06/29/2017   Acute encephalopathy    AKI (acute kidney injury) ([place])    Dysarthria 09/29/2015   Abnormal liver function 09/29/2015   Acute renal failure (ARF) ([place]) 09/29/2015   Hyponatremia 09/29/2015   Small bowel obstruction ([place]) 05/17/2015   Hypothyroidism 05/17/2015   Uncontrolled hypertension 05/17/2015   Hypocalcemia 05/17/2015   Leukocytosis 05/17/2015   Paroxysmal atrial flutter ([place]) 04/24/2015   Incarcerated incisional hernia 03/28/2015   SBO (small bowel obstruction) ([place]) 03/28/2015   Nonischemic cardiomyopathy ([place]) 11/27/2012   Biventricular ICD  11/27/2012   Chronic diastolic heart failure ([place]) 11/27/2012   Peripheral neuropathy secondary to chemotherapy 11/27/2012   Hyperlipidemia 11/27/2012   COPD (chronic obstructive pulmonary disease) ([place]) 12/03/2011   DOE (dyspnea on exertion) 11/05/2011   PCP:  Carolee Rota, NP Pharmacy:   [place], [place] - [address] [place] [address] Phone: [phone] Fax: [phone]     Social Determinants of Health (SDOH) Interventions    Readmission Risk Interventions    05/21/2020    2:10 PM  Readmission Risk Prevention Plan  Transportation Screening Complete  PCP or Specialist Appt within 3-5 Days Complete  HRI or [place] Complete  Social Work Consult for [place] Planning/Counseling Complete  Medication Review Press photographer) Complete

## 2021-10-05 NOTE — Progress Notes (Signed)
PT Cancellation Note  Patient Details Name: [name] MRN: [id number] DOB: 1928-09-07   Cancelled Treatment:    Reason Eval/Treat Not Completed:  Attempted PT eval-pt refused to participate/attempt to mobilize at this time. She reported back pain. Per chart review, pt received pain meds earlier this morning. Pressed call button for pt so she could discuss pain control with RN. Will check back another day to complete PT eval if pt will allow. She stated she does not mobilize at all at home-no family present during session.     [facility]  Office: [phone] Pager: [phone]

## 2021-10-05 NOTE — Progress Notes (Signed)
Initial Nutrition Assessment  DOCUMENTATION CODES:   Not applicable  INTERVENTION:  - continue to encourage PO intakes at meals.   NUTRITION DIAGNOSIS:   Increased nutrient needs related to acute illness as evidenced by estimated needs.  GOAL:   Patient will meet greater than or equal to 90% of their needs  MONITOR:   PO intake, Labs, Weight trends  REASON FOR ASSESSMENT:   Malnutrition Screening Tool  ASSESSMENT:   88 y.o. female with medical history of chronic diastolic CHF, non-ischemic cardiomyopathy s/p biventricular ICD, COPD on home O2, hypothyroidism, HTN, HLD, dementia, CHF, peripheral neuropathy, and ARF. She presented to the ED due to intractable N/V for 3 days.  Patient laying in bed with no visitors present at the time of RD visit. Tech in room getting patient's vitals.  Patient reports that she is able to feed herself as long as someone helps her open containers.  She confirms nausea PTA but reports to RD that she never vomited. She shares that she is feeling much better today and that she was told she will d/c tomorrow.  Diet advanced from CLD to Regular yesterday at [phone] and she has been eating 95-100% at meals since that time. Breakfast this AM provided 794 kcal and 23 grams protein. She reports having a large lunch today; she was still consuming part of it when RD arrived.  Weight on 7/8 was 139 lb and PTA the most recently documented weight was 145 lb on 01/12/21. This indicates 6 lb weight loss (4% body weight).   Labs reviewed; Cl: 97 mmol/l, BUN: 24 mg/dl, creatinine: 1.21 mg/dl, Ca: 8.2 mg/dl, GFR: 42 ml/min.  Medications reviewed; 100 mg colace BID, 40 mg oral lasix/day, 112 mcg oral synthroid/day.    NUTRITION - FOCUSED PHYSICAL EXAM:  Flowsheet Row Most Recent Value  Orbital Region No depletion  Upper Arm Region No depletion  Thoracic and Lumbar Region Unable to assess  Buccal Region No depletion  Temple Region No depletion  Clavicle  Bone Region No depletion  Clavicle and Acromion Bone Region No depletion  Scapular Bone Region No depletion  Dorsal Hand No depletion  Patellar Region No depletion  Anterior Thigh Region No depletion  Posterior Calf Region No depletion  Edema (RD Assessment) Mild  [BLE]  Hair Reviewed  Eyes Reviewed  Mouth Reviewed  Skin Reviewed  Nails Reviewed       Diet Order:   Diet Order             Diet regular Room service appropriate? Yes; Fluid consistency: Thin  Diet effective now                   EDUCATION NEEDS:   No education needs have been identified at this time  Skin:  Skin Assessment: Reviewed RN Assessment  Last BM:  PTA/unknown  Height:   Ht Readings from Last 1 Encounters:  10/03/21 '5\' 3"'$  (1.6 m)    Weight:   Wt Readings from Last 1 Encounters:  10/03/21 63.3 kg     BMI:  Body mass index is 24.72 kg/m.  Estimated Nutritional Needs:  Kcal:  1500-1750 kcal Protein:  75-85 grams Fluid:  >/= 1.8 L/day     Jarome Matin, MS, RD, LDN, CNSC Registered Dietitian II Inpatient Clinical Nutrition RD pager # and on-call/weekend pager # available in [facility]

## 2021-10-05 NOTE — Progress Notes (Signed)
PROGRESS NOTE  [name] BJY:[id number] DOB: 1929-02-27 DOA: 10/03/2021 PCP: Carolee Rota, NP   LOS: 1 day   Brief Narrative / Interim history: 88 y.o. female with medical history significant of chronic diastolic CHF, nonischemic cardiomyopathy status post biventricular ICD, COPD with chronic hypoxic respiratory failure with home oxygen, hypothyroidism, HTN, HLD, dementia who comes into the hospital brought by family due to intractable nausea and vomiting for the past 3 days.  Patient has underlying dementia but can contribute a little bit to the story and tells me that she was unable to eat or drink anything in this amount of time.   Subjective / 24h Interval events: She has some back pain this morning, now better. Nausea improved.  Her breathing is better  Assesement and Plan: Principal Problem:   Intractable nausea and vomiting Active Problems:   DOE (dyspnea on exertion)   COPD (chronic obstructive pulmonary disease) (HCC)   Nonischemic cardiomyopathy (HCC)   Biventricular ICD    Chronic diastolic heart failure (HCC)   Peripheral neuropathy secondary to chemotherapy   Hyperlipidemia   Hypothyroidism   Essential hypertension   Chronic pain syndrome   Severe hypothyroidism  Principal problem Intractable nausea and vomiting, possibly due to urinary tract infection vs profound hypothyroidism-UA is positive and she has symptoms with dysuria.  She was started on ceftriaxone, continue.  Urine cultures speciated E. coli which is pansensitive.  Narrowed to oral antibiotics on discharge also to cover her right upper lobe pneumonia  Active problems COPD with chronic hypoxic respiratory failure, right upper lobe pneumonia-due to mild shortness of breath and chest x-ray findings, doxycycline was added to cover respiratory illness.  Breathing seems to be improving, narrowed to oral antibiotics tomorrow and consider discharge if she continues to improve   Profound hypothyroidism-TSH was  found to be elevated 80, which may also explain her abdominal complaints.  She appears to be on Synthroid 112/day.  She also has Cytomel 25 listed but she is not taking this.  Daughter tells me that they ran out of Synthroid about 3 weeks ago and recently got it resumed, which can explain her elevated TSH.  Resume Synthroid at 112/day, she will need to take it as directed and repeat TSH in 3 to 4 weeks as an outpatient.   Essential hypertension-continue Coreg, furosemide  Nonischemic cardiomyopathy-appears stable, continue aspirin, Coreg  Chronic diastolic CHF-BNP slightly elevated, already received fluid in the ED, will not fluid resuscitate anymore.  Continue her home Lasix   Chronic pain-continue home oxycodone  Depression-continue home medications  Scheduled Meds:  carvedilol  12.5 mg Oral BID WC   docusate sodium  100 mg Oral BID   dorzolamide-timolol  1 drop Both Eyes BID   DULoxetine  60 mg Oral Daily   enoxaparin (LOVENOX) injection  30 mg Subcutaneous Q24H   furosemide  40 mg Oral Daily   gabapentin  100 mg Oral TID   latanoprost  1 drop Both Eyes QHS   levothyroxine  150 mcg Oral Q0600   mometasone-formoterol  2 puff Inhalation BID   Continuous Infusions:  cefTRIAXone (ROCEPHIN)  IV 1 g (10/05/21 0865)   doxycycline (VIBRAMYCIN) IV 100 mg (10/05/21 0506)   PRN Meds:.acetaminophen **OR** acetaminophen, albuterol, ondansetron **OR** ondansetron (ZOFRAN) IV, mouth rinse, oxyCODONE, polyethylene glycol, traZODone  Diet Orders (From admission, onward)     Start     Ordered   10/04/21 0752  Diet regular Room service appropriate? Yes; Fluid consistency: Thin  Diet effective now  Question Answer Comment  Room service appropriate? Yes   Fluid consistency: Thin      10/04/21 0751            DVT prophylaxis: enoxaparin (LOVENOX) injection 30 mg Start: 10/05/21 2200   Lab Results  Component Value Date   PLT 127 (L) 10/05/2021      Code Status: DNR  Family  Communication: Discussed with daughter at bedside on 7/9.  Unable to reach her over the phone on 7/10  Status is: Inpatient  Level of care: Telemetry  Consultants:  none  Objective: Vitals:   10/05/21 0435 10/05/21 0836 10/05/21 0855 10/05/21 0940  BP: (!) 143/71 (!) 150/75    Pulse: 60 67    Resp: '16  17 19  '$ Temp: 98.3 F (36.8 C)     TempSrc: Oral     SpO2: 97%     Weight:      Height:        Intake/Output Summary (Last 24 hours) at 10/05/2021 1325 Last data filed at 10/05/2021 1100 Gross per 24 hour  Intake 934 ml  Output 400 ml  Net 534 ml    Wt Readings from Last 3 Encounters:  10/03/21 63.3 kg  03/11/21 65.8 kg  01/12/21 65.8 kg    Examination:  Constitutional: NAD Eyes: lids and conjunctivae normal, no scleral icterus ENMT: mmm Neck: normal, supple Respiratory: clear to auscultation bilaterally, no wheezing, no crackles. Normal respiratory effort.  Cardiovascular: Regular rate and rhythm, no murmurs / rubs / gallops. Abdomen: soft, no distention, no tenderness. Bowel sounds positive.  Skin: no rashes Neurologic: no focal deficits, equal strength  Data Reviewed: I have independently reviewed following labs and imaging studies  CBC Recent Labs  Lab 10/03/21 1652 10/05/21 0819  WBC 4.7 4.8  HGB 16.9* 11.7*  HCT 51.9* 37.1  PLT 102* 127*  MCV 95.8 100.3*  MCH 31.2 31.6  MCHC 32.6 31.5  RDW 13.5 13.7  LYMPHSABS 0.9  --   MONOABS 0.3  --   EOSABS 0.2  --   BASOSABS 0.1  --      Recent Labs  Lab 10/03/21 1238 10/03/21 1239 10/03/21 1333 10/03/21 2206 10/04/21 0341 10/05/21 0350  NA 136  --   --   --   --  136  K 4.3  --   --   --   --  4.8  CL 97*  --   --   --   --  97*  CO2 31  --   --   --   --  33*  GLUCOSE 112*  --   --   --   --  96  BUN 21  --   --   --   --  24*  CREATININE 0.81  --   --   --   --  1.21*  CALCIUM 8.9  --   --   --   --  8.2*  AST 30  --   --   --   --   --   ALT 20  --   --   --   --   --   ALKPHOS 45  --    --   --   --   --   BILITOT 0.7  --   --   --   --   --   ALBUMIN 3.5  --   --   --   --   --   MG 2.3  --   --   --   --   --  DDIMER 2.10*  --   --   --   --   --   LATICACIDVEN  --   --  0.7 0.9  --   --   INR 1.0  --   --   --   --   --   TSH  --   --   --   --  80.212*  --   BNP  --  214.4*  --   --   --   --      ------------------------------------------------------------------------------------------------------------------ No results for input(s): "CHOL", "HDL", "LDLCALC", "TRIG", "CHOLHDL", "LDLDIRECT" in the last 72 hours.  Lab Results  Component Value Date   HGBA1C 5.4 09/30/2015   ------------------------------------------------------------------------------------------------------------------ Recent Labs    10/04/21 0341  TSH 80.212*     Cardiac Enzymes No results for input(s): "CKMB", "TROPONINI", "MYOGLOBIN" in the last 168 hours.  Invalid input(s): "CK" ------------------------------------------------------------------------------------------------------------------    Component Value Date/Time   BNP 214.4 (H) 10/03/2021 1239    CBG: No results for input(s): "GLUCAP" in the last 168 hours.  Recent Results (from the past 240 hour(s))  Blood Culture (routine x 2)     Status: None (Preliminary result)   Collection Time: 10/03/21  1:33 PM   Specimen: BLOOD  Result Value Ref Range Status   Specimen Description   Final    BLOOD BLOOD RIGHT FOREARM Performed at [facility] [address] [place], [place] [address]    Special Requests   Final    BOTTLES DRAWN AEROBIC AND ANAEROBIC Blood Culture results may not be optimal due to an inadequate volume of blood received in culture bottles Performed at [facility] [address] [place], [place] [address]    Culture   Final    NO GROWTH 2 DAYS Performed at [facility] [address] [place], [place] [address]    Report Status PENDING  Incomplete   Blood Culture (routine x 2)     Status: None (Preliminary result)   Collection Time: 10/03/21  1:38 PM   Specimen: BLOOD  Result Value Ref Range Status   Specimen Description   Final    BLOOD BLOOD LEFT FOREARM Performed at [facility] [address], [place], [place] [address]    Special Requests   Final    BOTTLES DRAWN AEROBIC ONLY Blood Culture results may not be optimal due to an inadequate volume of blood received in culture bottles Performed at [facility] [address] [place], [place] [address]    Culture   Final    NO GROWTH 2 DAYS Performed at [facility] [address] [place], [place] [address]    Report Status PENDING  Incomplete  Urine Culture     Status: Abnormal   Collection Time: 10/03/21  3:57 PM   Specimen: Urine, Clean Catch  Result Value Ref Range Status   Specimen Description   Final    URINE, CLEAN CATCH Performed at [facility] [place] [address], [place],  [address]    Special Requests   Final    NONE Performed at [facility], [place] [address], [place],  [address]    Culture >=100,000 COLONIES/mL ESCHERICHIA COLI (A)  Final   Report Status 10/05/2021 FINAL  Final   Organism ID, Bacteria ESCHERICHIA COLI (A)  Final      Susceptibility   Escherichia coli - MIC*    AMPICILLIN 8 SENSITIVE Sensitive  CEFAZOLIN <=4 SENSITIVE Sensitive     CEFEPIME <=0.12 SENSITIVE Sensitive     CEFTRIAXONE <=0.25 SENSITIVE Sensitive     CIPROFLOXACIN <=0.25 SENSITIVE Sensitive     GENTAMICIN <=1 SENSITIVE Sensitive     IMIPENEM <=0.25 SENSITIVE Sensitive     NITROFURANTOIN <=16 SENSITIVE Sensitive     TRIMETH/SULFA <=20 SENSITIVE Sensitive     AMPICILLIN/SULBACTAM <=2 SENSITIVE Sensitive     PIP/TAZO <=4 SENSITIVE Sensitive     * >=100,000 COLONIES/mL ESCHERICHIA COLI  SARS Coronavirus 2 by RT PCR (hospital order, performed in [place] hospital lab) *cepheid  single result test* Anterior Nasal Swab     Status: None   Collection Time: 10/03/21  5:55 PM   Specimen: Anterior Nasal Swab  Result Value Ref Range Status   SARS Coronavirus 2 by RT PCR NEGATIVE NEGATIVE Final    Comment: (NOTE) SARS-CoV-2 target nucleic acids are NOT DETECTED.  The SARS-CoV-2 RNA is generally detectable in upper and lower respiratory specimens during the acute phase of infection. The lowest concentration of SARS-CoV-2 viral copies this assay can detect is 250 copies / mL. A negative result does not preclude SARS-CoV-2 infection and should not be used as the sole basis for treatment or other patient management decisions.  A negative result may occur with improper specimen collection / handling, submission of specimen other than nasopharyngeal swab, presence of viral mutation(s) within the areas targeted by this assay, and inadequate number of viral copies (<250 copies / mL). A negative result must be combined with clinical observations, patient history, and epidemiological information.  Fact Sheet for Patients:   https://www.patel.info/  Fact Sheet for Healthcare Providers: https://hall.com/  This test is not yet approved or  cleared by the [place] FDA and has been authorized for detection and/or diagnosis of SARS-CoV-2 by FDA under an Emergency Use Authorization (EUA).  This EUA will remain in effect (meaning this test can be used) for the duration of the COVID-19 declaration under Section 564(b)(1) of the Act, 21 U.S.C. section 360bbb-3(b)(1), unless the authorization is terminated or revoked sooner.  Performed at [facility], [place] [address] [place], [place] [address]      Radiology Studies: No results found.   Marzetta Board, MD, PhD [facility]  Between 7 am - 7 pm I am available, please contact me via Amion (for emergencies) or Securechat (non urgent messages)  Between 7  pm - 7 am I am not available, please contact night coverage MD/APP via Amion

## 2021-10-06 DIAGNOSIS — R112 Nausea with vomiting, unspecified: Secondary | ICD-10-CM | POA: Diagnosis not present

## 2021-10-06 MED ORDER — AMOXICILLIN-POT CLAVULANATE 875-125 MG PO TABS: 1.0000 | ORAL_TABLET | Freq: Two times a day (BID) | ORAL | 0 refills | Status: DC

## 2021-10-06 NOTE — Discharge Summary (Signed)
Physician Discharge Summary  [name] SLH:[id number] DOB: 1929/03/02 DOA: 10/03/2021  PCP: Carolee Rota, NP  Admit date: 10/03/2021 Discharge date: 10/06/2021  Admitted From: home Disposition:  home  Recommendations for Outpatient Follow-up:  Follow up with PCP in 1-2 weeks  Home Health: none Equipment/Devices: none  Discharge Condition: stable CODE STATUS: DNR Diet Orders (From admission, onward)     Start     Ordered   10/04/21 0752  Diet regular Room service appropriate? Yes; Fluid consistency: Thin  Diet effective now       Question Answer Comment  Room service appropriate? Yes   Fluid consistency: Thin      10/04/21 0751            HPI: Per admitting MD, [name] is a 88 y.o. female with medical history significant of chronic diastolic CHF, nonischemic cardiomyopathy status post biventricular ICD, COPD with chronic hypoxic respiratory failure with home oxygen, hypothyroidism, HTN, HLD, dementia who comes into the hospital brought by family due to intractable nausea and vomiting for the past 3 days.  Patient has underlying dementia but can contribute a little bit to the story and tells me that she was unable to eat or drink anything in this amount of time.  She also reports intermittent low back pain.  She also complains of intermittent shortness of breath, but no cough, chest congestion, fever or chills.  She says that she has been having intermittent burning with urination as well.  Family not present at bedside and could not be reached via phone  Hospital Course / Discharge diagnoses: Principal Problem:   Intractable nausea and vomiting Active Problems:   DOE (dyspnea on exertion)   COPD (chronic obstructive pulmonary disease) (HCC)   Nonischemic cardiomyopathy (HCC)   Biventricular ICD    Chronic diastolic heart failure (HCC)   Peripheral neuropathy secondary to chemotherapy   Hyperlipidemia   Hypothyroidism   Essential hypertension   Chronic pain  syndrome   Severe hypothyroidism   Principal problem Intractable nausea and vomiting, possibly due to urinary tract infection vs profound hypothyroidism-UA is positive and she has symptoms with dysuria.  She was started on ceftriaxone, continue.  Urine cultures speciated E. coli which is pansensitive.  Narrowed to oral antibiotics on discharge also to cover her right upper lobe pneumonia   Active problems COPD with chronic hypoxic respiratory failure, right upper lobe pneumonia-due to mild shortness of breath and chest x-ray findings, doxycycline was added to cover respiratory illness.  Breathing seems to be improving, narrowed to Augmentin today upon discharge  Profound hypothyroidism-TSH was found to be elevated 80, which may also explain her abdominal complaints.  She appears to be on Synthroid 112/day.  She also has Cytomel 25 listed but she is not taking this.  Unfortunately she ran out of the Synthroid and has been out of it for about 3 weeks which could explain her TSH elevation, and just recently resumed her Synthroid.  Continue, repeat TSH in 3 to 4 weeks as an outpatient Essential hypertension-continue Coreg, furosemide Nonischemic cardiomyopathy-appears stable, continue aspirin, Coreg Chronic diastolic CHF-euvolemic, continue home medications Chronic pain-continue home oxycodone Depression-continue home medications  Sepsis ruled out   Discharge Instructions   Allergies as of 10/06/2021       Reactions   Digitalis Other (See Comments)   Poisoned patient - patient was hospitalized        Medication List     STOP taking these medications    liothyronine 25  MCG tablet Commonly known as: CYTOMEL       TAKE these medications    amoxicillin-clavulanate 875-125 MG tablet Commonly known as: AUGMENTIN Take 1 tablet by mouth 2 (two) times daily for 5 days.   aspirin EC 81 MG tablet Take 1 tablet (81 mg total) by mouth daily.   carvedilol 12.5 MG tablet Commonly  known as: COREG Take 1 tablet (12.5 mg total) by mouth in the morning and at bedtime.   docusate sodium 100 MG capsule Commonly known as: COLACE Take 1 capsule (100 mg total) by mouth every 12 (twelve) hours.   dorzolamide-timolol 22.3-6.8 MG/ML ophthalmic solution Commonly known as: COSOPT Place 1 drop into both eyes 2 (two) times daily.   Dulera 100-5 MCG/ACT Aero Generic drug: mometasone-formoterol Inhale 2 puffs into the lungs every 6 (six) hours as needed for wheezing or shortness of breath.   DULoxetine 60 MG capsule Commonly known as: CYMBALTA Take 60 mg by mouth daily.   furosemide 40 MG tablet Commonly known as: LASIX TAKE 1 TABLET BY MOUTH EVERY DAY What changed: Another medication with the same name was removed. Continue taking this medication, and follow the directions you see here.   gabapentin 100 MG capsule Commonly known as: NEURONTIN Take 300 mg by mouth daily.   latanoprost 0.005 % ophthalmic solution Commonly known as: XALATAN Place 1 drop into both eyes at bedtime.   oxyCODONE 15 MG immediate release tablet Commonly known as: ROXICODONE Take 1 tablet (15 mg total) by mouth every 6 (six) hours as needed for pain. What changed:  when to take this additional instructions   polyethylene glycol 17 g packet Commonly known as: MIRALAX / GLYCOLAX Take 17 g by mouth daily.   PRENATAL VITAMINS PO Take 1 tablet by mouth daily.   Synthroid 112 MCG tablet Generic drug: levothyroxine Take 112 mcg by mouth daily before breakfast.   traZODone 50 MG tablet Commonly known as: DESYREL Take 50 mg by mouth at bedtime as needed for sleep.   VITAMIN D3 PO Take 2 tablets by mouth daily.   Zinc Oxide 10 % Oint Apply 1 application topically 4 (four) times daily as needed (bed sores).        Consultations: none  Procedures/Studies:  CT Angio Chest PE W and/or Wo Contrast  Result Date: 10/03/2021 CLINICAL DATA:  Suspected pulmonary embolus. Nausea  vomiting and diarrhea.  Weakness. EXAM: CT ANGIOGRAPHY CHEST CT ABDOMEN AND PELVIS WITH CONTRAST TECHNIQUE: Multidetector CT imaging of the chest was performed using the standard protocol during bolus administration of intravenous contrast. Multiplanar CT image reconstructions and MIPs were obtained to evaluate the vascular anatomy. Multidetector CT imaging of the abdomen and pelvis was performed using the standard protocol during bolus administration of intravenous contrast. RADIATION DOSE REDUCTION: This exam was performed according to the departmental dose-optimization program which includes automated exposure control, adjustment of the mA and/or kV according to patient size and/or use of iterative reconstruction technique. CONTRAST:  17m OMNIPAQUE IOHEXOL 350 MG/ML SOLN COMPARISON:  Chest CT March 15, 2019 CT of the abdomen May 17, 2015 FINDINGS: CTA CHEST FINDINGS Cardiovascular: Satisfactory opacification of the pulmonary arteries to the segmental level. No evidence of pulmonary embolism. Pulmonary artery enlargement, usually associated with pulmonary arterial hypertension. Enlarged heart. Molar calcifications. Calcific atherosclerotic disease of the aorta. Mediastinum/Nodes: No enlarged mediastinal, hilar, or axillary lymph nodes. Thyroid gland, trachea, and esophagus demonstrate no significant findings. Lungs/Pleura: Small left pleural effusion. Left lower lobe atelectasis. Subtle tree in bud opacities  in the right upper lobe and right middle lobe, likely inflammatory. Musculoskeletal: No chest wall abnormality. No acute or significant osseous findings. Cardiac pacemaker noted. Review of the MIP images confirms the above findings. CT ABDOMEN and PELVIS FINDINGS Hepatobiliary: No focal liver abnormality is seen. Status post cholecystectomy. No biliary dilatation. Pancreas: Unremarkable. No pancreatic ductal dilatation or surrounding inflammatory changes. Spleen: Normal in size without focal  abnormality. Adrenals/Urinary Tract: Prominent adrenal glands. Atrophic left kidney. Bilateral extrarenal pelvises. Normal urinary bladder. Stomach/Bowel: Stomach is within normal limits. No evidence of bowel distention, or inflammatory changes. Non specific diffuse mucosal thickening of the rectum. Vascular/Lymphatic: Aortic atherosclerosis. No enlarged abdominal or pelvic lymph nodes. Reproductive: Status post hysterectomy. No adnexal masses. Other: Left supraumbilical anterior abdominal wall hernia with wide neck containing norm incarcerated bowel. A second narrower neck left anterior abdominal wall hernia, more inferiorly contains nonincarcerated small bowel. Musculoskeletal: Inferior endplate compression deformity of T12 vertebral body without unknown acuity 2. Multilevel degenerative changes of the lumbosacral spine. Has shaped scoliosis. Review of the MIP images confirms the above findings. IMPRESSION: 1. No evidence of pulmonary embolus. 2. Pulmonary artery enlargement, usually associated with pulmonary arterial hypertension. 3. Small left pleural effusion with left lower lobe atelectasis. 4. Subtle tree in bud opacities in the right upper lobe and right middle lobe, likely inflammatory or infectious. 5. No acute abnormalities in the abdomen or pelvis. 6. Atrophic left kidney. 7. Two left anterior abdominal wall hernias containing non incarcerated bowel. 8. Non specific diffuse mucosal thickening of the rectum. Please correlate with patient's symptoms. 9. Age-indeterminate compression fracture of T12 vertebral body with approximately 50% height loss. Aortic Atherosclerosis (ICD10-I70.0). Electronically Signed   By: Fidela Salisbury M.D.   On: 10/03/2021 17:04   CT ABDOMEN PELVIS W CONTRAST  Result Date: 10/03/2021 CLINICAL DATA:  Nausea and vomiting.  Abdominal pain.  05/17/2015 EXAM: CT ABDOMEN AND PELVIS WITH CONTRAST TECHNIQUE: Multidetector CT imaging of the abdomen and pelvis was performed using  the standard protocol following bolus administration of intravenous contrast. RADIATION DOSE REDUCTION: This exam was performed according to the departmental dose-optimization program which includes automated exposure control, adjustment of the mA and/or kV according to patient size and/or use of iterative reconstruction technique. CONTRAST:  154m OMNIPAQUE IOHEXOL 350 MG/ML SOLN COMPARISON:  None Available. FINDINGS: Lower chest: Mild collapse/consolidative opacity seen in the lingula and left lower lobe. Bronchiectasis with airway impaction is seen inferior right middle and lower lobes. 8 mm right lower lobe nodule seen on 30/6/8. 5 mm right lower lobe nodule seen on image 7/8. Hepatobiliary: No suspicious focal abnormality within the liver parenchyma. Gallbladder surgically absent. No intrahepatic or extrahepatic biliary dilation. Pancreas: Pancreatic parenchyma diffusely atrophic without main duct dilatation. Spleen: No splenomegaly. No focal mass lesion. Adrenals/Urinary Tract: Bilateral adrenal gland thickening without a discrete nodule or mass. Left kidney atrophic. Mild fullness noted right intrarenal collecting system with patulous renal pelvis. No evidence for hydroureter. Contrast material in the distal left ureter is likely from CTA chest performed earlier. The urinary bladder appears normal for the degree of distention. Stomach/Bowel: Stomach is nondistended. Mild wall thickening noted in the antral region, nonspecific potentially related to peristalsis. Duodenum is normally positioned as is the ligament of Treitz. No small bowel wall thickening. No small bowel dilatation. Nonvisualization of the appendix is consistent with the reported history of appendectomy. No gross colonic mass. No colonic wall thickening. Vascular/Lymphatic: There is advanced atherosclerotic calcification of the abdominal aorta without aneurysm. There is no gastrohepatic  or hepatoduodenal ligament lymphadenopathy. No  retroperitoneal or mesenteric lymphadenopathy. No pelvic sidewall lymphadenopathy. Reproductive: The uterus is surgically absent. There is no adnexal mass. Other: No intraperitoneal free fluid. Musculoskeletal: No worrisome lytic or sclerotic osseous abnormality. Degenerative changes noted right hip with evidence of underlying avascular necrosis. Compression deformity noted at T12 with advanced degenerative changes in the lumbar spine. IMPRESSION: 1. No acute findings in the abdomen or pelvis. 2. Mild wall thickening in the antral region of the stomach, nonspecific and potentially related to peristalsis. Gastritis not excluded. 3. Left renal atrophy. 4. Bronchiectasis with airway impaction in the inferior right middle and lower lobes. 5. 8 mm right lower lobe pulmonary nodule with 5 mm right lower lobe pulmonary nodule. Non-contrast chest CT at 3-6 months is recommended. If the nodules are stable at time of repeat CT, then future CT at 18-24 months (from today's scan) is considered optional for low-risk patients, but is recommended for high-risk patients. This recommendation follows the consensus statement: Guidelines for Management of Incidental Pulmonary Nodules Detected on CT Images: From the Fleischner Society 2017; Radiology 2017; 284:228-243. 6. Compression deformity at T12 with advanced degenerative changes in the lumbar spine. 7. Mild collapse/consolidative opacity seen in the lingula and left lower lobe. 8. Aortic Atherosclerosis (ICD10-I70.0). Electronically Signed   By: Misty Stanley M.D.   On: 10/03/2021 16:57   CT Head Wo Contrast  Result Date: 10/03/2021 CLINICAL DATA:  Mental status changes. EXAM: CT HEAD WITHOUT CONTRAST TECHNIQUE: Contiguous axial images were obtained from the base of the skull through the vertex without intravenous contrast. RADIATION DOSE REDUCTION: This exam was performed according to the departmental dose-optimization program which includes automated exposure control,  adjustment of the mA and/or kV according to patient size and/or use of iterative reconstruction technique. COMPARISON:  05/16/2020 FINDINGS: Brain: There is no evidence for acute hemorrhage, hydrocephalus, mass lesion, or abnormal extra-axial fluid collection. No definite CT evidence for acute infarction. Diffuse loss of parenchymal volume is consistent with atrophy. Patchy low attenuation in the deep hemispheric and periventricular white matter is nonspecific, but likely reflects chronic microvascular ischemic demyelination. Probable arachnoid cyst right middle cranial fossa is stable in the interval. Vascular: No hyperdense vessel or unexpected calcification. Skull: No evidence for fracture. No worrisome lytic or sclerotic lesion. Sinuses/Orbits: The visualized paranasal sinuses and mastoid air cells are clear. Visualized portions of the globes and intraorbital fat are unremarkable. Other: None. IMPRESSION: 1. No acute intracranial abnormality. 2. Atrophy with chronic small vessel ischemic disease. 3. Stable appearance arachnoid cyst anterior right middle cranial fossa. Electronically Signed   By: Misty Stanley M.D.   On: 10/03/2021 16:42   DG Chest Port 1 View  Result Date: 10/03/2021 CLINICAL DATA:  Nausea. EXAM: PORTABLE CHEST 1 VIEW COMPARISON:  None Available. FINDINGS: Mild ill-defined left basilar opacities. Otherwise, lungs are clear. Similar cardiomediastinal silhouette. Left subclavian approach cardiac rhythm maintenance device. Limited visualization left lung base. No definite pleural effusion. No visible pneumothorax. IMPRESSION: Left basilar opacities, which could represent atelectasis, aspiration, and/or pneumonia. Electronically Signed   By: Margaretha Sheffield M.D.   On: 10/03/2021 13:40     Subjective: - no chest pain, shortness of breath, no abdominal pain, nausea or vomiting.   Discharge Exam: BP (!) 155/75   Pulse 70   Temp 98 F (36.7 C)   Resp 17   Ht '5\' 3"'$  (1.6 m)   Wt 63.3 kg    SpO2 100%   BMI 24.72 kg/m   General: Pt  is alert, awake, not in acute distress Cardiovascular: RRR, S1/S2 +, no rubs, no gallops Respiratory: CTA bilaterally, no wheezing, no rhonchi    The results of significant diagnostics from this hospitalization (including imaging, microbiology, ancillary and laboratory) are listed below for reference.     Microbiology: Recent Results (from the past 240 hour(s))  Blood Culture (routine x 2)     Status: None (Preliminary result)   Collection Time: 10/03/21  1:33 PM   Specimen: BLOOD  Result Value Ref Range Status   Specimen Description   Final    BLOOD BLOOD RIGHT FOREARM Performed at [facility] [address], [place], [place] [address]    Special Requests   Final    BOTTLES DRAWN AEROBIC AND ANAEROBIC Blood Culture results may not be optimal due to an inadequate volume of blood received in culture bottles Performed at [facility] [address] [place], [place] [address]    Culture   Final    NO GROWTH 3 DAYS Performed at [facility], [place] [address] [place], [place] [address]    Report Status PENDING  Incomplete  Blood Culture (routine x 2)     Status: None (Preliminary result)   Collection Time: 10/03/21  1:38 PM   Specimen: BLOOD  Result Value Ref Range Status   Specimen Description   Final    BLOOD BLOOD LEFT FOREARM Performed at [facility] [address] [place], [place] [address]    Special Requests   Final    BOTTLES DRAWN AEROBIC ONLY Blood Culture results may not be optimal due to an inadequate volume of blood received in culture bottles Performed at [facility] [address], [place], [place] [address]    Culture   Final    NO GROWTH 3 DAYS Performed at [facility], [place] [address], [place], [place] [address]    Report Status PENDING  Incomplete  Urine Culture     Status: Abnormal   Collection Time: 10/03/21  3:57  PM   Specimen: Urine, Clean Catch  Result Value Ref Range Status   Specimen Description   Final    URINE, CLEAN CATCH Performed at [facility], [place] [address] [place], [place] [address]    Special Requests   Final    NONE Performed at [facility], [place] [address] [place], [place] [address]    Culture >=100,000 COLONIES/mL ESCHERICHIA COLI (A)  Final   Report Status 10/05/2021 FINAL  Final   Organism ID, Bacteria ESCHERICHIA COLI (A)  Final      Susceptibility   Escherichia coli - MIC*    AMPICILLIN 8 SENSITIVE Sensitive     CEFAZOLIN <=4 SENSITIVE Sensitive     CEFEPIME <=0.12 SENSITIVE Sensitive     CEFTRIAXONE <=0.25 SENSITIVE Sensitive     CIPROFLOXACIN <=0.25 SENSITIVE Sensitive     GENTAMICIN <=1 SENSITIVE Sensitive     IMIPENEM <=0.25 SENSITIVE Sensitive     NITROFURANTOIN <=16 SENSITIVE Sensitive     TRIMETH/SULFA <=20 SENSITIVE Sensitive     AMPICILLIN/SULBACTAM <=2 SENSITIVE Sensitive     PIP/TAZO <=4 SENSITIVE Sensitive     * >=100,000 COLONIES/mL ESCHERICHIA COLI  SARS Coronavirus 2 by RT PCR (hospital order, performed in [facility] hospital lab) *cepheid single result test* Anterior Nasal Swab     Status: None   Collection Time: 10/03/21  5:55 PM   Specimen: Anterior Nasal Swab  Result Value  Ref Range Status   SARS Coronavirus 2 by RT PCR NEGATIVE NEGATIVE Final    Comment: (NOTE) SARS-CoV-2 target nucleic acids are NOT DETECTED.  The SARS-CoV-2 RNA is generally detectable in upper and lower respiratory specimens during the acute phase of infection. The lowest concentration of SARS-CoV-2 viral copies this assay can detect is 250 copies / mL. A negative result does not preclude SARS-CoV-2 infection and should not be used as the sole basis for treatment or other patient management decisions.  A negative result may occur with improper specimen collection / handling, submission of specimen other than nasopharyngeal swab,  presence of viral mutation(s) within the areas targeted by this assay, and inadequate number of viral copies (<250 copies / mL). A negative result must be combined with clinical observations, patient history, and epidemiological information.  Fact Sheet for Patients:   https://www.patel.info/  Fact Sheet for Healthcare Providers: https://hall.com/  This test is not yet approved or  cleared by the [place] FDA and has been authorized for detection and/or diagnosis of SARS-CoV-2 by FDA under an Emergency Use Authorization (EUA).  This EUA will remain in effect (meaning this test can be used) for the duration of the COVID-19 declaration under Section 564(b)(1) of the Act, 21 U.S.C. section 360bbb-3(b)(1), unless the authorization is terminated or revoked sooner.  Performed at [facility], [place] [address], [place], [place] [address]      Labs: Basic Metabolic Panel: Recent Labs  Lab 10/03/21 1238 10/05/21 0350  NA 136 136  K 4.3 4.8  CL 97* 97*  CO2 31 33*  GLUCOSE 112* 96  BUN 21 24*  CREATININE 0.81 1.21*  CALCIUM 8.9 8.2*  MG 2.3  --    Liver Function Tests: Recent Labs  Lab 10/03/21 1238  AST 30  ALT 20  ALKPHOS 45  BILITOT 0.7  PROT 7.1  ALBUMIN 3.5   CBC: Recent Labs  Lab 10/03/21 1652 10/05/21 0819  WBC 4.7 4.8  NEUTROABS 3.2  --   HGB 16.9* 11.7*  HCT 51.9* 37.1  MCV 95.8 100.3*  PLT 102* 127*   CBG: No results for input(s): "GLUCAP" in the last 168 hours. Hgb A1c No results for input(s): "HGBA1C" in the last 72 hours. Lipid Profile No results for input(s): "CHOL", "HDL", "LDLCALC", "TRIG", "CHOLHDL", "LDLDIRECT" in the last 72 hours. Thyroid function studies Recent Labs    10/04/21 0341  TSH 80.212*   Urinalysis    Component Value Date/Time   COLORURINE YELLOW 10/03/2021 1459   APPEARANCEUR CLOUDY (A) 10/03/2021 1459   LABSPEC 1.017 10/03/2021 1459   PHURINE 6.0  10/03/2021 1459   GLUCOSEU NEGATIVE 10/03/2021 1459   HGBUR SMALL (A) 10/03/2021 1459   BILIRUBINUR NEGATIVE 10/03/2021 1459   KETONESUR NEGATIVE 10/03/2021 1459   PROTEINUR 100 (A) 10/03/2021 1459   NITRITE NEGATIVE 10/03/2021 1459   LEUKOCYTESUR LARGE (A) 10/03/2021 1459    FURTHER DISCHARGE INSTRUCTIONS:   Get Medicines reviewed and adjusted: Please take all your medications with you for your next visit with your Primary MD   Laboratory/radiological data: Please request your Primary MD to go over all hospital tests and procedure/radiological results at the follow up, please ask your Primary MD to get all Hospital records sent to his/her office.   In some cases, they will be blood work, cultures and biopsy results pending at the time of your discharge. Please request that your primary care M.D. goes through all the records of your hospital data and follows up  on these results.   Also Note the following: If you experience worsening of your admission symptoms, develop shortness of breath, life threatening emergency, suicidal or homicidal thoughts you must seek medical attention immediately by calling 911 or calling your MD immediately  if symptoms less severe.   You must read complete instructions/literature along with all the possible adverse reactions/side effects for all the Medicines you take and that have been prescribed to you. Take any new Medicines after you have completely understood and accpet all the possible adverse reactions/side effects.    Do not drive when taking Pain medications or sleeping medications (Benzodaizepines)   Do not take more than prescribed Pain, Sleep and Anxiety Medications. It is not advisable to combine anxiety,sleep and pain medications without talking with your primary care practitioner   Special Instructions: If you have smoked or chewed Tobacco  in the last 2 yrs please stop smoking, stop any regular Alcohol  and or any Recreational drug use.    Wear Seat belts while driving.   Please note: You were cared for by a hospitalist during your hospital stay. Once you are discharged, your primary care physician will handle any further medical issues. Please note that NO REFILLS for any discharge medications will be authorized once you are discharged, as it is imperative that you return to your primary care physician (or establish a relationship with a primary care physician if you do not have one) for your post hospital discharge needs so that they can reassess your need for medications and monitor your lab values.  Time coordinating discharge: 40 minutes  SIGNED:  Marzetta Board, MD, PhD 10/06/2021, 10:49 AM

## 2021-10-06 NOTE — Plan of Care (Signed)

## 2021-10-06 NOTE — Progress Notes (Signed)
OT Cancellation Note  Patient Details Name: NIKEISHA KLUTZ MRN: [id number] DOB: September 07, 1928   Cancelled Treatment:    Reason Eval/Treat Not Completed: OT screened, no needs identified, will sign off Patient reported being bed bound at home and not wanting therapy at this time. OT to sign off. Thank you for this referral.  Jackelyn Poling OTR/L, [facility] Office# [phone] Pager# [phone]  10/06/2021, 11:19 AM

## 2021-10-06 NOTE — Progress Notes (Signed)
   10/06/21 1119  PT Visit Information  Last PT Received On 10/06/21  Reason Eval/Treat Not Completed PT screened, no needs identified, will sign off (Pt politely deferring mobility and states that she does not want therapy services during stay. Reports that she is bed bound at home and has assist from daughter and granddaughter for all mobility and ADLs. Has adjustable bed, wheelchair, and manual lift)   Therapist attempted to call daughter x2 with pt's permission but no answer. PT will sign off as pt stating no need for skilled PT services at this time and reports has all needed DME at home.

## 2021-10-06 NOTE — TOC Transition Note (Signed)
Transition of Care [facility]) - CM/SW Discharge Note   Patient Details  Name: MADDEN PIAZZA MRN: [id number] Date of Birth: [birth year]  [facility] of [facility]) CM/SW Contact:  Leeroy Cha, RN Phone Number: 10/06/2021, 11:29 AM   Clinical Narrative:    Tct-daughter dawn/alerted of transport she will meet the transport at the home.  Ptar was called at 1100 for transport.     Barriers to Discharge: Continued Medical Work up   Patient Goals and CMS Choice Patient states their goals for this hospitalization and ongoing recovery are:: to go home CMS Medicare.gov Compare Post Acute Care list provided to:: Patient    Discharge Placement                       Discharge Plan and Services   Discharge Planning Services: CM Consult                                 Social Determinants of Health (SDOH) Interventions     Readmission Risk Interventions    05/21/2020    2:10 PM  Readmission Risk Prevention Plan  Transportation Screening Complete  PCP or Specialist Appt within 3-5 Days Complete  HRI or [place] Complete  Social Work Consult for [place] Planning/Counseling Complete  Medication Review [employer]) Complete

## 2021-10-08 ENCOUNTER — Emergency Department (HOSPITAL_COMMUNITY): Payer: Medicare Other

## 2021-10-08 ENCOUNTER — Other Ambulatory Visit: Payer: Self-pay

## 2021-10-08 ENCOUNTER — Inpatient Hospital Stay (HOSPITAL_COMMUNITY)
Admission: EM | Admit: 2021-10-08 | Discharge: 2021-10-12 | DRG: 189 | Disposition: A | Discharge status: Home / Self Care | Payer: Medicare Other | Attending: Internal Medicine | Admitting: Internal Medicine

## 2021-10-08 ENCOUNTER — Encounter (HOSPITAL_COMMUNITY): Payer: Self-pay

## 2021-10-08 DIAGNOSIS — I110 Hypertensive heart disease with heart failure: Secondary | ICD-10-CM | POA: Diagnosis not present

## 2021-10-08 DIAGNOSIS — Z9981 Dependence on supplemental oxygen: Secondary | ICD-10-CM | POA: Diagnosis not present

## 2021-10-08 DIAGNOSIS — F0390 Unspecified dementia without behavioral disturbance: Secondary | ICD-10-CM | POA: Diagnosis present

## 2021-10-08 DIAGNOSIS — Z95810 Presence of automatic (implantable) cardiac defibrillator: Secondary | ICD-10-CM | POA: Diagnosis not present

## 2021-10-08 DIAGNOSIS — M2550 Pain in unspecified joint: Secondary | ICD-10-CM | POA: Diagnosis not present

## 2021-10-08 DIAGNOSIS — Z801 Family history of malignant neoplasm of trachea, bronchus and lung: Secondary | ICD-10-CM

## 2021-10-08 DIAGNOSIS — I428 Other cardiomyopathies: Secondary | ICD-10-CM | POA: Diagnosis present

## 2021-10-08 DIAGNOSIS — R531 Weakness: Secondary | ICD-10-CM | POA: Diagnosis not present

## 2021-10-08 DIAGNOSIS — J918 Pleural effusion in other conditions classified elsewhere: Secondary | ICD-10-CM | POA: Diagnosis present

## 2021-10-08 DIAGNOSIS — R0602 Shortness of breath: Secondary | ICD-10-CM | POA: Diagnosis not present

## 2021-10-08 DIAGNOSIS — N179 Acute kidney failure, unspecified: Secondary | ICD-10-CM | POA: Diagnosis present

## 2021-10-08 DIAGNOSIS — D696 Thrombocytopenia, unspecified: Secondary | ICD-10-CM | POA: Diagnosis present

## 2021-10-08 DIAGNOSIS — J479 Bronchiectasis, uncomplicated: Secondary | ICD-10-CM | POA: Diagnosis present

## 2021-10-08 DIAGNOSIS — E039 Hypothyroidism, unspecified: Secondary | ICD-10-CM | POA: Diagnosis present

## 2021-10-08 DIAGNOSIS — G894 Chronic pain syndrome: Secondary | ICD-10-CM | POA: Diagnosis present

## 2021-10-08 DIAGNOSIS — Z8543 Personal history of malignant neoplasm of ovary: Secondary | ICD-10-CM

## 2021-10-08 DIAGNOSIS — J440 Chronic obstructive pulmonary disease with acute lower respiratory infection: Secondary | ICD-10-CM | POA: Diagnosis not present

## 2021-10-08 DIAGNOSIS — R9431 Abnormal electrocardiogram [ECG] [EKG]: Secondary | ICD-10-CM | POA: Diagnosis not present

## 2021-10-08 DIAGNOSIS — K429 Umbilical hernia without obstruction or gangrene: Secondary | ICD-10-CM | POA: Diagnosis not present

## 2021-10-08 DIAGNOSIS — R001 Bradycardia, unspecified: Secondary | ICD-10-CM | POA: Diagnosis not present

## 2021-10-08 DIAGNOSIS — Z79890 Hormone replacement therapy (postmenopausal): Secondary | ICD-10-CM

## 2021-10-08 DIAGNOSIS — N390 Urinary tract infection, site not specified: Secondary | ICD-10-CM | POA: Diagnosis not present

## 2021-10-08 DIAGNOSIS — G629 Polyneuropathy, unspecified: Secondary | ICD-10-CM | POA: Diagnosis present

## 2021-10-08 DIAGNOSIS — J441 Chronic obstructive pulmonary disease with (acute) exacerbation: Secondary | ICD-10-CM

## 2021-10-08 DIAGNOSIS — M549 Dorsalgia, unspecified: Secondary | ICD-10-CM

## 2021-10-08 DIAGNOSIS — Z90710 Acquired absence of both cervix and uterus: Secondary | ICD-10-CM

## 2021-10-08 DIAGNOSIS — I5032 Chronic diastolic (congestive) heart failure: Secondary | ICD-10-CM | POA: Diagnosis present

## 2021-10-08 DIAGNOSIS — Z66 Do not resuscitate: Secondary | ICD-10-CM | POA: Diagnosis present

## 2021-10-08 DIAGNOSIS — Z87440 Personal history of urinary (tract) infections: Secondary | ICD-10-CM

## 2021-10-08 DIAGNOSIS — B9620 Unspecified Escherichia coli [E. coli] as the cause of diseases classified elsewhere: Secondary | ICD-10-CM | POA: Diagnosis present

## 2021-10-08 DIAGNOSIS — J90 Pleural effusion, not elsewhere classified: Secondary | ICD-10-CM | POA: Diagnosis not present

## 2021-10-08 DIAGNOSIS — J9811 Atelectasis: Secondary | ICD-10-CM | POA: Diagnosis not present

## 2021-10-08 DIAGNOSIS — Z7401 Bed confinement status: Secondary | ICD-10-CM | POA: Diagnosis not present

## 2021-10-08 DIAGNOSIS — R918 Other nonspecific abnormal finding of lung field: Secondary | ICD-10-CM | POA: Diagnosis present

## 2021-10-08 DIAGNOSIS — S22080A Wedge compression fracture of T11-T12 vertebra, initial encounter for closed fracture: Secondary | ICD-10-CM | POA: Diagnosis not present

## 2021-10-08 DIAGNOSIS — Z888 Allergy status to other drugs, medicaments and biological substances status: Secondary | ICD-10-CM

## 2021-10-08 DIAGNOSIS — E785 Hyperlipidemia, unspecified: Secondary | ICD-10-CM | POA: Diagnosis present

## 2021-10-08 DIAGNOSIS — R030 Elevated blood-pressure reading, without diagnosis of hypertension: Secondary | ICD-10-CM | POA: Diagnosis present

## 2021-10-08 DIAGNOSIS — Z9049 Acquired absence of other specified parts of digestive tract: Secondary | ICD-10-CM

## 2021-10-08 DIAGNOSIS — K439 Ventral hernia without obstruction or gangrene: Secondary | ICD-10-CM | POA: Diagnosis present

## 2021-10-08 DIAGNOSIS — K5900 Constipation, unspecified: Secondary | ICD-10-CM | POA: Diagnosis not present

## 2021-10-08 DIAGNOSIS — Z8701 Personal history of pneumonia (recurrent): Secondary | ICD-10-CM

## 2021-10-08 DIAGNOSIS — M545 Low back pain, unspecified: Secondary | ICD-10-CM | POA: Diagnosis not present

## 2021-10-08 DIAGNOSIS — Z8249 Family history of ischemic heart disease and other diseases of the circulatory system: Secondary | ICD-10-CM

## 2021-10-08 DIAGNOSIS — D649 Anemia, unspecified: Secondary | ICD-10-CM | POA: Diagnosis present

## 2021-10-08 DIAGNOSIS — R110 Nausea: Principal | ICD-10-CM

## 2021-10-08 DIAGNOSIS — R4182 Altered mental status, unspecified: Secondary | ICD-10-CM | POA: Diagnosis not present

## 2021-10-08 DIAGNOSIS — J189 Pneumonia, unspecified organism: Secondary | ICD-10-CM | POA: Diagnosis present

## 2021-10-08 DIAGNOSIS — J9621 Acute and chronic respiratory failure with hypoxia: Principal | ICD-10-CM | POA: Diagnosis present

## 2021-10-08 DIAGNOSIS — Z79899 Other long term (current) drug therapy: Secondary | ICD-10-CM

## 2021-10-08 DIAGNOSIS — R112 Nausea with vomiting, unspecified: Secondary | ICD-10-CM | POA: Diagnosis not present

## 2021-10-08 LAB — COMPREHENSIVE METABOLIC PANEL
ALT: 13 U/L (ref 0–44)
AST: 22 U/L (ref 15–41)
Albumin: 3.5 g/dL (ref 3.5–5.0)
Alkaline Phosphatase: 43 U/L (ref 38–126)
Anion gap: 7 (ref 5–15)
BUN: 19 mg/dL (ref 8–23)
CO2: 38 mmol/L — ABNORMAL HIGH (ref 22–32)
Calcium: 9 mg/dL (ref 8.9–10.3)
Chloride: 89 mmol/L — ABNORMAL LOW (ref 98–111)
Creatinine, Ser: 0.94 mg/dL (ref 0.44–1.00)
GFR, Estimated: 57 mL/min — ABNORMAL LOW (ref >60)
Glucose, Bld: 122 mg/dL — ABNORMAL HIGH (ref 70–99)
Potassium: 4.4 mmol/L (ref 3.5–5.1)
Sodium: 134 mmol/L — ABNORMAL LOW (ref 135–145)
Total Bilirubin: 0.5 mg/dL (ref 0.3–1.2)
Total Protein: 7 g/dL (ref 6.5–8.1)

## 2021-10-08 LAB — TSH: TSH: 56.433 u[IU]/mL — ABNORMAL HIGH (ref 0.350–4.500)

## 2021-10-08 LAB — CBC
HCT: 36.6 % (ref 36.0–46.0)
Hemoglobin: 11.8 g/dL — ABNORMAL LOW (ref 12.0–15.0)
MCH: 31.9 pg (ref 26.0–34.0)
MCHC: 32.2 g/dL (ref 30.0–36.0)
MCV: 98.9 fL (ref 80.0–100.0)
Platelets: 118 10*3/uL — ABNORMAL LOW (ref 150–400)
RBC: 3.7 MIL/uL — ABNORMAL LOW (ref 3.87–5.11)
RDW: 14 % (ref 11.5–15.5)
WBC: 5.2 10*3/uL (ref 4.0–10.5)
nRBC: 0 % (ref 0.0–0.2)

## 2021-10-08 LAB — CULTURE, BLOOD (ROUTINE X 2)
Culture: NO GROWTH
Culture: NO GROWTH

## 2021-10-08 LAB — TROPONIN I (HIGH SENSITIVITY)
Troponin I (High Sensitivity): 13 ng/L (ref <18)
Troponin I (High Sensitivity): 11 ng/L (ref <18)

## 2021-10-08 LAB — URINALYSIS, ROUTINE W REFLEX MICROSCOPIC
Bilirubin Urine: NEGATIVE
Glucose, UA: NEGATIVE mg/dL
Hgb urine dipstick: NEGATIVE
Ketones, ur: NEGATIVE mg/dL
Leukocytes,Ua: NEGATIVE
Nitrite: NEGATIVE
Protein, ur: NEGATIVE mg/dL
Specific Gravity, Urine: 1.004 — ABNORMAL LOW (ref 1.005–1.030)
pH: 6 (ref 5.0–8.0)

## 2021-10-08 LAB — BRAIN NATRIURETIC PEPTIDE: B Natriuretic Peptide: 123.3 pg/mL — ABNORMAL HIGH (ref 0.0–100.0)

## 2021-10-08 LAB — T4, FREE: Free T4: 0.58 ng/dL — ABNORMAL LOW (ref 0.61–1.12)

## 2021-10-08 LAB — LIPASE, BLOOD: Lipase: 26 U/L (ref 11–51)

## 2021-10-08 LAB — D-DIMER, QUANTITATIVE: D-Dimer, Quant: 2.24 ug/mL-FEU — ABNORMAL HIGH (ref 0.00–0.50)

## 2021-10-08 MED ORDER — IOHEXOL 350 MG/ML SOLN
100.0000 mL | Freq: Once | INTRAVENOUS | Status: AC | PRN
Start: 2021-10-08 — End: 2021-10-08
  Administered 2021-10-08: 100 mL via INTRAVENOUS

## 2021-10-08 MED ORDER — TRAZODONE HCL 50 MG PO TABS
50.0000 mg | ORAL_TABLET | Freq: Every evening | ORAL | Status: DC | PRN
  Administered 2021-10-08: 50 mg via ORAL
  Filled 2021-10-08: qty 1

## 2021-10-08 MED ORDER — CARVEDILOL 12.5 MG PO TABS
12.5000 mg | ORAL_TABLET | Freq: Two times a day (BID) | ORAL | Status: DC
  Administered 2021-10-09 – 2021-10-12 (×8): 12.5 mg via ORAL
  Filled 2021-10-08 (×8): qty 1

## 2021-10-08 MED ORDER — MOMETASONE FURO-FORMOTEROL FUM 100-5 MCG/ACT IN AERO
2.0000 | INHALATION_SPRAY | Freq: Four times a day (QID) | RESPIRATORY_TRACT | Status: DC | PRN
Start: 2021-10-08 — End: 2021-10-13

## 2021-10-08 MED ORDER — SODIUM CHLORIDE 0.9 % IV BOLUS
1000.0000 mL | Freq: Once | INTRAVENOUS | Status: AC
  Administered 2021-10-08: 1000 mL via INTRAVENOUS

## 2021-10-08 MED ORDER — AMOXICILLIN-POT CLAVULANATE 875-125 MG PO TABS
1.0000 | ORAL_TABLET | Freq: Two times a day (BID) | ORAL | Status: DC
  Administered 2021-10-08 – 2021-10-11 (×6): 1 via ORAL
  Filled 2021-10-08 (×6): qty 1

## 2021-10-08 MED ORDER — DORZOLAMIDE HCL-TIMOLOL MAL 2-0.5 % OP SOLN
1.0000 [drp] | Freq: Two times a day (BID) | OPHTHALMIC | Status: DC
Start: 2021-10-08 — End: 2021-10-13
  Administered 2021-10-08 – 2021-10-12 (×7): 1 [drp] via OPHTHALMIC
  Filled 2021-10-08 (×2): qty 10

## 2021-10-08 MED ORDER — OXYCODONE HCL 5 MG PO TABS
15.0000 mg | ORAL_TABLET | Freq: Four times a day (QID) | ORAL | Status: DC
  Administered 2021-10-08 – 2021-10-12 (×16): 15 mg via ORAL
  Filled 2021-10-08 (×17): qty 3

## 2021-10-08 MED ORDER — DULOXETINE HCL 60 MG PO CPEP
60.0000 mg | ORAL_CAPSULE | Freq: Every day | ORAL | Status: DC
  Administered 2021-10-09 – 2021-10-12 (×4): 60 mg via ORAL
  Filled 2021-10-08 (×2): qty 1
  Filled 2021-10-08: qty 2
  Filled 2021-10-08: qty 1

## 2021-10-08 MED ORDER — LATANOPROST 0.005 % OP SOLN
1.0000 [drp] | Freq: Every day | OPHTHALMIC | Status: DC
  Administered 2021-10-08 – 2021-10-11 (×4): 1 [drp] via OPHTHALMIC
  Filled 2021-10-08: qty 2.5

## 2021-10-08 MED ORDER — FENTANYL CITRATE PF 50 MCG/ML IJ SOSY
50.0000 ug | PREFILLED_SYRINGE | Freq: Once | INTRAMUSCULAR | Status: AC
  Administered 2021-10-08: 50 ug via INTRAVENOUS
  Filled 2021-10-08: qty 1

## 2021-10-08 MED ORDER — OXYCODONE-ACETAMINOPHEN 5-325 MG PO TABS
1.0000 | ORAL_TABLET | Freq: Once | ORAL | Status: AC
  Administered 2021-10-08: 1 via ORAL
  Filled 2021-10-08: qty 1

## 2021-10-08 MED ORDER — POLYETHYLENE GLYCOL 3350 17 G PO PACK
17.0000 g | PACK | Freq: Every day | ORAL | Status: DC
  Administered 2021-10-10 – 2021-10-12 (×3): 17 g via ORAL
  Filled 2021-10-08 (×4): qty 1

## 2021-10-08 MED ORDER — ONDANSETRON HCL 4 MG/2ML IJ SOLN
4.0000 mg | Freq: Once | INTRAMUSCULAR | Status: AC | PRN
  Administered 2021-10-08: 4 mg via INTRAVENOUS
  Filled 2021-10-08: qty 2

## 2021-10-08 MED ORDER — SODIUM CHLORIDE (PF) 0.9 % IJ SOLN
INTRAMUSCULAR | Status: AC
  Filled 2021-10-08: qty 50

## 2021-10-08 MED ORDER — FUROSEMIDE 10 MG/ML IJ SOLN
40.0000 mg | Freq: Every day | INTRAMUSCULAR | Status: DC
  Administered 2021-10-09 – 2021-10-10 (×2): 40 mg via INTRAVENOUS
  Filled 2021-10-08 (×2): qty 4

## 2021-10-08 MED ORDER — DOCUSATE SODIUM 100 MG PO CAPS
100.0000 mg | ORAL_CAPSULE | Freq: Two times a day (BID) | ORAL | Status: DC
  Administered 2021-10-08 – 2021-10-12 (×8): 100 mg via ORAL
  Filled 2021-10-08 (×8): qty 1

## 2021-10-08 MED ORDER — GABAPENTIN 300 MG PO CAPS
300.0000 mg | ORAL_CAPSULE | Freq: Three times a day (TID) | ORAL | Status: DC | PRN
  Administered 2021-10-10 – 2021-10-11 (×2): 300 mg via ORAL
  Filled 2021-10-08 (×3): qty 1

## 2021-10-08 MED ORDER — LEVOTHYROXINE SODIUM 112 MCG PO TABS
112.0000 ug | ORAL_TABLET | Freq: Every day | ORAL | Status: DC
  Administered 2021-10-09 – 2021-10-12 (×4): 112 ug via ORAL
  Filled 2021-10-08 (×4): qty 1

## 2021-10-08 NOTE — Assessment & Plan Note (Signed)
Recent right sided pneumonia-resolving on imaging. Continue Augmentin until 7/16.

## 2021-10-08 NOTE — ED Provider Notes (Signed)
Patient signed out to me awaiting CT scan of her chest.  Recently hospitalized and diagnosed with UTI, pneumonia, possibly hypothyroidism.  She has been home for 2 days but she states that she is feeling short of breath, still with some nausea at times.  She has been on Augmentin.  She lives at home with her daughter with whom on the phone she states that she is unable to take care of her at home.  She is not eating or drinking much.  She is bedbound at baseline.  She does not have any home support.  Patient chronically on 2 L of oxygen but upon my evaluation oxygens in the upper 80s low 90s.  Titrated up to 4 L.  She mostly states that she has felt short of breath more so than anything the last several days.  For work-up is unremarkable.  Troponins unremarkable.  TSH is improved to 56 from 80.  BNP is 123.  CT scan of the chest today shows no evidence of aneurysm or dissection however there is increase in size of a moderate left pleural effusion causing compressive atelectasis in the left lower lobe.  There is not any obvious pneumonia.  Although there is some infectious/inflammatory process in the right lung similar to prior study.  Overall, suspect the majority of her symptoms are from this fluid in her lungs.  Does not seem like this is from heart failure.  Not sure if it is from atelectasis/immobility/infectious process.  She may benefit from a thoracentesis or possibly some diuresis.  Suspect that she needs home health/short-term nursing facility as does not appear that she is doing well at home socially as well.  Will admit for further care.  This chart was dictated using voice recognition software.  Despite best efforts to proofread,  errors can occur which can change the documentation meaning.    Lennice Sites, DO 10/08/21 1816

## 2021-10-08 NOTE — Assessment & Plan Note (Signed)
Profound hypothyroidism -Previously with TSH of 80 on recent admission due to missing her Synthroid for 3 weeks and was resumed on discharge.  TSH is improving now at 56.  Continue Synthroid and recommend follow-up outpatient in about 4 weeks.

## 2021-10-08 NOTE — Assessment & Plan Note (Signed)
-   Baseline on 2 L in the setting of COPD with chronic hypoxemic respiratory failure.  Requiring 4 L on presentation. -Secondary to enlarging left pleural effusion.  Unclear source as her BNP is at 123 and her recent pneumonia was in the right upper lobe.  She also is afebrile without leukocytosis -Will trial IV Lasix and monitor intake and output -Obtain ultrasound tomorrow to see if amount of fluid is amenable to thoracentesis and obtain fluid studies

## 2021-10-08 NOTE — ED Notes (Signed)
When attempting to ambulate patient, she stated that she does not walk or stand and that she hasn't for over a year. She states she does not do this because her bones are weak and brittle.

## 2021-10-08 NOTE — Assessment & Plan Note (Addendum)
-  Increasing left sided moderate pleural effusion. Unclear if related to CHF but BNP is low with negative troponin.  -No fever or leukocytosis to suggest infection. Previous pneumonia was right sided.  -Will trial IV Lasix and monitor intake and output -Obtain ultrasound tomorrow to see if amount of fluid is amenable to thoracentesis and obtain fluid studies

## 2021-10-08 NOTE — ED Provider Notes (Signed)
[facility] DEPT Provider Note   CSN: [id number] Arrival date & time: 10/08/21  1212     History  Chief Complaint  Patient presents with   Nausea   Weakness    [name] is a 88 y.o. female.  Patient with history of COPD, hypothyroidism CHF with AICD recent hospitalization for UTI and hypothyroidism here with "same symptoms".  She was discharged from the hospital 2 days ago.  She states she is no better and still having severe pain in her mid back and nausea.  His back pain does not appear to be new but she is unable to quantify how long it has been there.  No fall or trauma.  States she has severe nausea and generalized weakness but no vomiting.  No fever.  On 2 L of oxygen at home which is unchanged.  Denies chest pain or shortness of breath.  Has some diffuse abdominal pain and mid back pain.  States she is moving her bowels for the past 2 to 3 days normally.  Still taking Augmentin for a urinary tract infection and possible pneumonia through July 16.  The history is provided by the patient and the EMS personnel.  Weakness Associated symptoms: abdominal pain, nausea and vomiting   Associated symptoms: no chest pain, no cough, no dizziness, no dysuria, no fever and no headaches        Home Medications Prior to Admission medications   Medication Sig Start Date End Date Taking? Authorizing Provider  amoxicillin-clavulanate (AUGMENTIN) 875-125 MG tablet Take 1 tablet by mouth 2 (two) times daily for 5 days. 10/06/21 10/11/21  Caren Griffins, MD  aspirin EC 81 MG tablet Take 1 tablet (81 mg total) by mouth daily. Patient not taking: Reported on 10/04/2021 10/03/17   Croitoru, Mihai, MD  carvedilol (COREG) 12.5 MG tablet Take 1 tablet (12.5 mg total) by mouth in the morning and at bedtime. 06/24/21   Croitoru, Mihai, MD  Cholecalciferol (VITAMIN D3 PO) Take 2 tablets by mouth daily.    [provider]  docusate sodium (COLACE) 100 MG capsule  Take 1 capsule (100 mg total) by mouth every 12 (twelve) hours. 01/12/21   Sherwood Gambler, MD  dorzolamide-timolol (COSOPT) 22.3-6.8 MG/ML ophthalmic solution Place 1 drop into both eyes 2 (two) times daily. 02/18/20   Shelly Coss, MD  DULERA 100-5 MCG/ACT AERO Inhale 2 puffs into the lungs every 6 (six) hours as needed for wheezing or shortness of breath.  08/29/19   [provider]  DULoxetine (CYMBALTA) 60 MG capsule Take 60 mg by mouth daily.  08/25/15   [provider]  furosemide (LASIX) 40 MG tablet TAKE 1 TABLET BY MOUTH EVERY DAY Patient taking differently: Take 40 mg by mouth daily. 08/27/20   Croitoru, Mihai, MD  gabapentin (NEURONTIN) 100 MG capsule Take 300 mg by mouth daily.    [provider]  latanoprost (XALATAN) 0.005 % ophthalmic solution Place 1 drop into both eyes at bedtime. 02/18/20   Shelly Coss, MD  oxyCODONE (ROXICODONE) 15 MG immediate release tablet Take 1 tablet (15 mg total) by mouth every 6 (six) hours as needed for pain. Patient taking differently: Take 15 mg by mouth in the morning, at noon, in the evening, and at bedtime. Takes at 0300 0900 1500 2100 02/17/20   Shelly Coss, MD  polyethylene glycol (MIRALAX / GLYCOLAX) 17 g packet Take 17 g by mouth daily. 01/12/21   Sherwood Gambler, MD  Prenatal Vit-Fe Fumarate-FA (PRENATAL  VITAMINS PO) Take 1 tablet by mouth daily.    [provider]  SYNTHROID 112 MCG tablet Take 112 mcg by mouth daily before breakfast.  09/12/15   [provider]  traZODone (DESYREL) 50 MG tablet Take 50 mg by mouth at bedtime as needed for sleep.  01/10/20   [provider]  Zinc Oxide 10 % OINT Apply 1 application topically 4 (four) times daily as needed (bed sores).    [provider]      Allergies    Digitalis    Review of Systems   Review of Systems  Constitutional:  Positive for activity change, appetite change and fatigue. Negative for fever.  HENT:  Negative for  congestion and rhinorrhea.   Respiratory:  Negative for cough, chest tightness and stridor.   Cardiovascular:  Negative for chest pain.  Gastrointestinal:  Positive for abdominal pain, nausea and vomiting.  Genitourinary:  Negative for dysuria, hematuria, vaginal bleeding and vaginal discharge.  Musculoskeletal:  Positive for back pain.  Skin:  Negative for rash.  Neurological:  Positive for weakness. Negative for dizziness and headaches.   all other systems are negative except as noted in the HPI and PMH.    Physical Exam Updated Vital Signs BP (!) 181/88 (BP Location: Right Arm)   Pulse 64   Temp 98.9 F (37.2 C) (Oral)   Resp 18   Ht '5\' 2"'$  (1.575 m)   Wt 61.2 kg   SpO2 95%   BMI 24.69 kg/m  Physical Exam Vitals and nursing note reviewed.  Constitutional:      General: She is not in acute distress.    Appearance: She is well-developed.  HENT:     Head: Normocephalic and atraumatic.     Mouth/Throat:     Pharynx: No oropharyngeal exudate.  Eyes:     Conjunctiva/sclera: Conjunctivae normal.     Pupils: Pupils are equal, round, and reactive to light.  Neck:     Comments: No meningismus. Cardiovascular:     Rate and Rhythm: Normal rate and regular rhythm.     Heart sounds: Normal heart sounds. No murmur heard. Pulmonary:     Effort: Pulmonary effort is normal. No respiratory distress.     Breath sounds: Normal breath sounds.  Chest:     Chest wall: No tenderness.  Abdominal:     Palpations: Abdomen is soft.     Tenderness: There is abdominal tenderness. There is no guarding or rebound.     Comments: Soft, reducible ventral hernia  Musculoskeletal:        General: Tenderness present. Normal range of motion.     Cervical back: Normal range of motion and neck supple.     Comments: Midthoracic tenderness, no step-off or deformity 5/5 strength lower extremities with intact DP and PT pulses  Skin:    General: Skin is warm.  Neurological:     General: No focal deficit  present.     Mental Status: She is alert and oriented to person, place, and time. Mental status is at baseline.     Cranial Nerves: No cranial nerve deficit.     Motor: No abnormal muscle tone.     Coordination: Coordination normal.     Comments:  5/5 strength throughout. CN 2-12 intact.Equal grip strength.   Psychiatric:        Behavior: Behavior normal.     ED Results / Procedures / Treatments   Labs (all labs ordered are listed, but only abnormal results are  displayed) Labs Reviewed  COMPREHENSIVE METABOLIC PANEL - Abnormal; Notable for the following components:      Result Value   Sodium 134 (*)    Chloride 89 (*)    CO2 38 (*)    Glucose, Bld 122 (*)    GFR, Estimated 57 (*)    All other components within normal limits  CBC - Abnormal; Notable for the following components:   RBC 3.70 (*)    Hemoglobin 11.8 (*)    Platelets 118 (*)    All other components within normal limits  URINALYSIS, ROUTINE W REFLEX MICROSCOPIC - Abnormal; Notable for the following components:   Color, Urine COLORLESS (*)    Specific Gravity, Urine 1.004 (*)    All other components within normal limits  BRAIN NATRIURETIC PEPTIDE - Abnormal; Notable for the following components:   B Natriuretic Peptide 123.3 (*)    All other components within normal limits  TSH - Abnormal; Notable for the following components:   TSH 56.433 (*)    All other components within normal limits  D-DIMER, QUANTITATIVE - Abnormal; Notable for the following components:   D-Dimer, Quant 2.24 (*)    All other components within normal limits  URINE CULTURE  LIPASE, BLOOD  TROPONIN I (HIGH SENSITIVITY)  TROPONIN I (HIGH SENSITIVITY)    EKG EKG Interpretation  Date/Time:  Thursday October 08 2021 12:56:23 EDT Ventricular Rate:  65 PR Interval:  194 QRS Duration: 144 QT Interval:  466 QTC Calculation: 484 R Axis:   -37 Text Interpretation: Atrial-sensed ventricular-paced rhythm Abnormal ECG When compared with ECG of  03-Oct-2021 12:54, PREVIOUS ECG IS PRESENT No significant change was found Confirmed by Ezequiel Essex [phone]) on 10/08/2021 1:00:53 PM  Radiology DG Chest Portable 1 View  Result Date: 10/08/2021 CLINICAL DATA:  Altered mental status, nausea, weak S, CHF, COPD, hypertension EXAM: PORTABLE CHEST 1 VIEW COMPARISON:  Portable exam 1317 hours compared to 10/03/2021 FINDINGS: LEFT subclavian ICD leads project over RIGHT atrium, RIGHT ventricle, coronary sinus. Upper normal size of cardiac silhouette. Mediastinal contours and pulmonary vascularity normal. Atherosclerotic calcification aorta. LEFT pleural effusion and basilar atelectasis with mild elevation of LEFT diaphragm noted. Remaining lungs clear. No acute infiltrate or pneumothorax. Bones demineralized. IMPRESSION: RIGHT pleural effusion and basilar atelectasis. Aortic Atherosclerosis (ICD10-I70.0). Electronically Signed   By: Lavonia Dana M.D.   On: 10/08/2021 13:26    Procedures Procedures    Medications Ordered in ED Medications  ondansetron (ZOFRAN) injection 4 mg (has no administration in time range)  sodium chloride 0.9 % bolus 1,000 mL (has no administration in time range)    ED Course/ Medical Decision Making/ A&P                           Medical Decision Making Amount and/or Complexity of Data Reviewed Independent Historian: EMS Labs: ordered. Decision-making details documented in ED Course. Radiology: ordered and independent interpretation performed. Decision-making details documented in ED Course. ECG/medicine tests: ordered and independent interpretation performed. Decision-making details documented in ED Course.  Risk Prescription drug management.  Patient with nausea and back pain.  Recently admitted for UTI and failure to thrive at home.  Vitals are stable, no distress.  Previous work-up reviewed.  Patient did have CT imaging including pulmonary embolism and bowel obstruction.  CT head is normal.  Patient given  gentle IV fluids.  Hemoglobin is stable.  Creatinine is at baseline.  Chest x-ray shows small right pleural effusion.  Urinalysis today is negative.  Patient still taking antibiotics for her discharged 2 days ago.  TSH is improving.  CT will be obtained to evaluate for aortic or intraabdominal pathology given her back pain. Did have recent CTPE that was negative on 7/8.  Patient remained stable.  CT pending at time of shift change.  Awaiting repeat troponin as well as p.o. challenge and ambulatory in trial prior to discharge home.  Care to be transferred at shift change        Final Clinical Impression(s) / ED Diagnoses Final diagnoses:  Nausea  Mid back pain    Rx / DC Orders ED Discharge Orders     None         Oracio Galen, Annie Main, MD 10/08/21 1728

## 2021-10-08 NOTE — Assessment & Plan Note (Signed)
S/p biventricular ICD. Stable with negative troponin.

## 2021-10-08 NOTE — ED Triage Notes (Signed)
Pt coming from home via EMS with c/o nausea and weakness increasing over the past 3 days since she was discharged from the hospital. Seen and admitted for same recently. 2L [place] baseline.

## 2021-10-08 NOTE — Assessment & Plan Note (Signed)
Continue home oxycodone, gabapentin

## 2021-10-08 NOTE — Assessment & Plan Note (Signed)
She reports diarrhea but CT abd/pelvis is showing increase stool burden. Will continue home stool softener regimen.

## 2021-10-08 NOTE — Assessment & Plan Note (Signed)
Previous E.coli UTI. UA today shows it is resolving. Continue Augmentin until 7/16.

## 2021-10-08 NOTE — H&P (Signed)
History and Physical    Patient: [name] DOB: November 06, 1928 DOA: 10/08/2021 DOS: the patient was seen and examined on 10/08/2021 PCP: Carolee Rota, NP  Patient coming from: Home  Chief Complaint:  Chief Complaint  Patient presents with   Nausea   Weakness   HPI: [name] is a 88 y.o. female with medical history significant of chronic diastolic CHF, nonischemic cardiomyopathy s/p biventricular ICD, COPD with chronic hypoxemic respiratory failure on 2 L, hypothyroidism, hypertension, hyperlipidemia and dementia who presents with increasing shortness of breath.  She was recently admitted from 7/8 to 7/11 for intractable nausea and vomiting thought secondary to UTI versus profound hypothyroidism.  Urine culture was positive for E. coli.  Also found to have right upper lobe pneumonia and was discharged home on Augmentin.TSH elevated to 80 after missing her Synthroid for 3 weeks and was resumed.  She reports more progressive shortness of breath and continues to have nausea but no vomiting.  Reports daily diarrhea.  In the ED, she was afebrile hypertensive with SBP up to 190 and had desaturation down to 80s to low 90s on her home 2 L and had to be uptitrated to 4 L.Had no leukocytosis, mild anemia with hemoglobin 11.8.  Thrombocytopenia of 118. BNP of 123.  Troponin of 11. Lactate was normal at 0.9. Sodium of 134, potassium 4.4, chloride 89, CO2 of 38, glucose of 122, creatinine is normal at 0.94. TSH improved from prior at 56  UA was negative.  CTA chest/abdomen/pelvis was negative for pulmonary embolism but showed moderate left pleural effusion that has enlarged since prior study on 7/8.  Trace right pleural effusion.  She was given 1 L of normal saline bolus in the ED and hospitalist was called for admission.   Review of Systems: As mentioned in the history of present illness. All other systems reviewed and are negative. Past Medical History:  Diagnosis Date    AICD (automatic cardioverter/defibrillator) present 06/27/2007   medtronic   ARF (acute renal failure) ([place]) 09/2017   Cancer ([place])    ovarian--stage 4   Cardiomyopathy (HCC)    CHF (congestive heart failure) (HCC)    COPD (chronic obstructive pulmonary disease) (HCC)    Dyspnea    Hypothyroidism    Peripheral neuropathy    Systemic hypertension    Thyroid disease    Past Surgical History:  Procedure Laterality Date   ABDOMINAL HYSTERECTOMY  oct 2000   along with ovarian ca surg   APPENDECTOMY     BIV PACEMAKER GENERATOR CHANGEOUT N/A 09/10/2019   Procedure: [place];  Surgeon: Sanda Klein, MD;  Location: [facility];  Service: Cardiovascular;  Laterality: N/A;   CARDIAC CATHETERIZATION  09/27/1990   normal coronaries,dilated cardiomyopathy   CARDIAC DEFIBRILLATOR PLACEMENT  06/27/2007   medtronic concerto   CHOLECYSTECTOMY     IMPLANTABLE CARDIOVERTER DEFIBRILLATOR GENERATOR CHANGE N/A 07/31/2013   Procedure: IMPLANTABLE CARDIOVERTER DEFIBRILLATOR GENERATOR CHANGE;  Surgeon: Sanda Klein, MD;  Location: [facility];  Service: Cardiovascular;  Laterality: N/A;   NM MYOCAR PERF WALL MOTION  06/05/2007   no ischemia   Social History:  reports that she has never smoked. She has never used smokeless tobacco. She reports that she does not drink alcohol and does not use drugs.  Allergies  Allergen Reactions   Digitalis Other (See Comments)    Poisoned patient - patient was hospitalized   Losartan     Other reaction(s): Unknown    Family History  Problem Relation Age of Onset   Lung cancer Mother        died from cancer   Heart attack Father    Lung cancer Father    Heart disease Paternal Grandmother     Prior to Admission medications   Medication Sig Start Date End Date Taking? Authorizing Provider  amoxicillin-clavulanate (AUGMENTIN) 875-125 MG tablet Take 1 tablet by mouth 2 (two) times daily for 5 days. 10/06/21 10/11/21 Yes Gherghe, Vella Redhead, MD  carvedilol (COREG) 12.5 MG tablet Take 1 tablet (12.5 mg total) by mouth in the morning and at bedtime. 06/24/21  Yes Croitoru, Mihai, MD  Cholecalciferol (VITAMIN D3 PO) Take 2 tablets by mouth daily.   Yes [provider]  docusate sodium (COLACE) 100 MG capsule Take 1 capsule (100 mg total) by mouth every 12 (twelve) hours. 01/12/21  Yes Sherwood Gambler, MD  dorzolamide-timolol (COSOPT) 22.3-6.8 MG/ML ophthalmic solution Place 1 drop into both eyes 2 (two) times daily. 02/18/20  Yes Adhikari, Tamsen Meek, MD  DULERA 100-5 MCG/ACT AERO Inhale 2 puffs into the lungs every 6 (six) hours as needed for wheezing or shortness of breath.  08/29/19  Yes [provider]  DULoxetine (CYMBALTA) 60 MG capsule Take 60 mg by mouth daily.  08/25/15  Yes [provider]  furosemide (LASIX) 40 MG tablet TAKE 1 TABLET BY MOUTH EVERY DAY Patient taking differently: Take 40 mg by mouth daily. 08/27/20  Yes Croitoru, Mihai, MD  gabapentin (NEURONTIN) 300 MG capsule Take 300 mg by mouth 3 (three) times daily as needed (For pain).   Yes [provider]  latanoprost (XALATAN) 0.005 % ophthalmic solution Place 1 drop into both eyes at bedtime. 02/18/20  Yes Shelly Coss, MD  oxyCODONE (ROXICODONE) 15 MG immediate release tablet Take 1 tablet (15 mg total) by mouth every 6 (six) hours as needed for pain. Patient taking differently: Take 15 mg by mouth in the morning, at noon, in the evening, and at bedtime. Takes at [place] 2100 02/17/20  Yes Adhikari, Tamsen Meek, MD  polyethylene glycol (MIRALAX / GLYCOLAX) 17 g packet Take 17 g by mouth daily. 01/12/21  Yes Sherwood Gambler, MD  Prenatal Vit-Fe Fumarate-FA (PRENATAL VITAMINS PO) Take 1 tablet by mouth daily.   Yes [provider]  SYNTHROID 112 MCG tablet Take 112 mcg by mouth daily before breakfast.  09/12/15  Yes [provider]  traZODone (DESYREL) 50 MG tablet Take 50 mg by mouth at bedtime as needed for sleep.   01/10/20  Yes [provider]  Zinc Oxide 10 % OINT Apply 1 application topically 4 (four) times daily as needed (bed sores).   Yes [provider]  aspirin EC 81 MG tablet Take 1 tablet (81 mg total) by mouth daily. Patient not taking: Reported on 10/04/2021 10/03/17   Sanda Klein, MD    Physical Exam: Vitals:   10/08/21 1613 10/08/21 1800 10/08/21 1804 10/08/21 2125  BP: (!) 141/80 (!) 191/81 (!) 191/81 (!) 160/86  Pulse: 65 73 73 77  Resp: '18  18 18  '$ Temp:      TempSrc:      SpO2: 99% 99% 99% 97%  Weight:      Height:       Constitutional: NAD, calm, comfortable, elderly chronically ill appearing female laying slough over to her left upright in bed Eyes: lids and conjunctivae normal ENMT: Mucous membranes are moist.  Neck: normal, supple Respiratory: decrease left lower lobe lung sound, no wheezing, no  crackles. Normal respiratory effort. No accessory muscle use. On 4L via [place].  Cardiovascular: Regular rate and rhythm, no murmurs / rubs / gallops. Trace pitting lower extremity edema. 2+ pedal pulses. No carotid bruits.  Abdomen: no tenderness, two easily reducible mid abdomen ventral hernias. Bowel sounds positive.  Musculoskeletal: no clubbing / cyanosis. No joint deformity upper and lower extremities. Good ROM, no contractures. Normal muscle tone.  Skin: no rashes, lesions, ulcers. Neurologic: CN 2-12 grossly intact. Strength 5/5 in all 4.  Psychiatric: Normal judgment and insight. Alert and oriented x 3. Normal mood. Data Reviewed:  See HPI  Assessment and Plan: * Pleural effusion -Increasing left sided moderate pleural effusion. Unclear if related to CHF but BNP is low with negative troponin.  -No fever or leukocytosis to suggest infection. Previous pneumonia was right sided.  -Will trial IV Lasix and monitor intake and output -Obtain ultrasound tomorrow to see if amount of fluid is amenable to thoracentesis and obtain fluid studies  History of  UTI Previous E.coli UTI. UA today shows it is resolving. Continue Augmentin until 7/16.  History of pneumonia Recent right sided pneumonia-resolving on imaging. Continue Augmentin until 7/16.  Constipation She reports diarrhea but CT abd/pelvis is showing increase stool burden. Will continue home stool softener regimen.   Acute on chronic respiratory failure with hypoxia (HCC) - Baseline on 2 L in the setting of COPD with chronic hypoxemic respiratory failure.  Requiring 4 L on presentation. -Secondary to enlarging left pleural effusion.  Unclear source as her BNP is at 123 and her recent pneumonia was in the right upper lobe.  She also is afebrile without leukocytosis -Will trial IV Lasix and monitor intake and output -Obtain ultrasound tomorrow to see if amount of fluid is amenable to thoracentesis and obtain fluid studies  Chronic pain syndrome Continue home oxycodone, gabapentin  Hypothyroidism Profound hypothyroidism -Previously with TSH of 80 on recent admission due to missing her Synthroid for 3 weeks and was resumed on discharge.  TSH is improving now at 56.  Continue Synthroid and recommend follow-up outpatient in about 4 weeks.  Chronic diastolic heart failure (HCC) Unclear if increasing pleural effusion is due to acute CHF. Her BNP is low at 123 with negative Troponin.  -will start her on IV Lasix and follow intake and outpt. Do fluid study if amendable to thoracentesis tomorrow  Nonischemic cardiomyopathy [facility]) S/p biventricular ICD. Stable with negative troponin.       Advance Care Planning:   Code Status: DNR -confirmed with pt who was alert and oriented x4.  Consults: none  Family Communication: attempted to call daughter twice over phone but no answer.  Severity of Illness: The appropriate patient status for this patient is INPATIENT. Inpatient status is judged to be reasonable and necessary in order to provide the required intensity of service to ensure the  patient's safety. The patient's presenting symptoms, physical exam findings, and initial radiographic and laboratory data in the context of their chronic comorbidities is felt to place them at high risk for further clinical deterioration. Furthermore, it is not anticipated that the patient will be medically stable for discharge from the hospital within 2 midnights of admission.   * I certify that at the point of admission it is my clinical judgment that the patient will require inpatient hospital care spanning beyond 2 midnights from the point of admission due to high intensity of service, high risk for further deterioration and high frequency of surveillance required.*  Author: Orene Desanctis,  DO 10/08/2021 10:50 PM  For on call review [email or web address].

## 2021-10-08 NOTE — Assessment & Plan Note (Signed)
Unclear if increasing pleural effusion is due to acute CHF. Her BNP is low at 123 with negative Troponin.  -will start her on IV Lasix and follow intake and outpt. Do fluid study if amendable to thoracentesis tomorrow

## 2021-10-08 NOTE — Discharge Instructions (Signed)
Your testing is reassuring.  No evidence of blood clot in the lung or pneumonia.  Continue the antibiotics you are taking for your urinary tract infection.  Keep yourself hydrated.  Follow-up with your primary doctor for recheck next week.  Return to the ED with difficulty breathing, chest pain, not able to eat or drink or other concerns

## 2021-10-09 ENCOUNTER — Inpatient Hospital Stay (HOSPITAL_COMMUNITY): Payer: Medicare Other

## 2021-10-09 DIAGNOSIS — Z95810 Presence of automatic (implantable) cardiac defibrillator: Secondary | ICD-10-CM | POA: Diagnosis not present

## 2021-10-09 DIAGNOSIS — G894 Chronic pain syndrome: Secondary | ICD-10-CM | POA: Diagnosis not present

## 2021-10-09 DIAGNOSIS — J90 Pleural effusion, not elsewhere classified: Secondary | ICD-10-CM | POA: Diagnosis not present

## 2021-10-09 LAB — URINE CULTURE: Culture: NO GROWTH

## 2021-10-09 LAB — BASIC METABOLIC PANEL
Anion gap: 10 (ref 5–15)
BUN: 15 mg/dL (ref 8–23)
CO2: 37 mmol/L — ABNORMAL HIGH (ref 22–32)
Calcium: 8.4 mg/dL — ABNORMAL LOW (ref 8.9–10.3)
Chloride: 91 mmol/L — ABNORMAL LOW (ref 98–111)
Creatinine, Ser: 0.98 mg/dL (ref 0.44–1.00)
GFR, Estimated: 54 mL/min — ABNORMAL LOW (ref >60)
Glucose, Bld: 99 mg/dL (ref 70–99)
Potassium: 3.9 mmol/L (ref 3.5–5.1)
Sodium: 138 mmol/L (ref 135–145)

## 2021-10-09 MED ORDER — LIDOCAINE HCL 1 % IJ SOLN
INTRAMUSCULAR | Status: AC
  Filled 2021-10-09: qty 20

## 2021-10-09 NOTE — Hospital Course (Signed)
88 y.o. female with medical history significant of chronic diastolic CHF, nonischemic cardiomyopathy s/p biventricular ICD, COPD with chronic hypoxemic respiratory failure on 2 L, hypothyroidism, hypertension, hyperlipidemia and dementia who presents with increasing shortness of breath. Pt found to have effusion on chest imaging and concerns for volume overload

## 2021-10-09 NOTE — Progress Notes (Signed)
Interventional Radiology Brief Note:  Patient brought to [name] for possible thoracentesis.  Limited US Chest shows only a very small, thin pocket of fluid.  Patient is confused, unable to sit up on the side of the bed but having a hard time lying still/holding position.  No procedure performed.  Patient returned to ED.   Brynda Greathouse, MS RD PA-C

## 2021-10-09 NOTE — Progress Notes (Signed)
Arrived to 6E/ 1616 from Emergency Department for admission. Placed in bed, oriented to room and call bell. Call bell in reach, bed in lowest position and bed alarm in place and audible.

## 2021-10-09 NOTE — Progress Notes (Signed)
  Progress Note   Patient: [name]:[id number] DOB: [birth year] DOA: 10/08/2021     1 DOS: the patient was seen and examined on 10/09/2021   Brief hospital course: 88 y.o. female with medical history significant of chronic diastolic CHF, nonischemic cardiomyopathy s/p biventricular ICD, COPD with chronic hypoxemic respiratory failure on 2 L, hypothyroidism, hypertension, hyperlipidemia and dementia who presents with increasing shortness of breath. Pt found to have effusion on chest imaging and concerns for volume overload  Assessment and Plan: * Pleural effusion -Increasing left sided moderate pleural effusion noted on imaging -No fever or leukocytosis to suggest infection. Previous pneumonia was right sided.  -Continue IV Lasix as tolerated -US guided thoracentesis was ordered, however limited US chest shows only very small, thin pocket of fluid. No procedure performed -Recheck bmet in AM  History of UTI -Previous E.coli UTI. Continue Augmentin until 7/16.  History of pneumonia Recent right sided pneumonia-resolving on imaging. Continue Augmentin until 7/16.  Constipation -She reports diarrhea but CT abd/pelvis is showing increase stool burden.  -Will continue home stool softener regimen.  -Would give cathartics as needed  Acute on chronic respiratory failure with hypoxia (HCC) - Baseline on 2 L in the setting of COPD with chronic hypoxemic respiratory failure.  Requiring 4 L on presentation. -Improved with IV lasix with O2 requirements weaned to RA this afternoon -repeat bmet in AM  Chronic pain syndrome Continue home oxycodone, gabapentin  Hypothyroidism Profound hypothyroidism -Previously with TSH of 80 on recent admission due to missing her Synthroid for 3 weeks and was resumed on discharge.  TSH is improving now at 56.  Continue Synthroid and recommend follow-up outpatient in about 4 weeks.  Acute on Chronic diastolic heart failure (HCC) Unclear if increasing  pleural effusion is due to acute CHF. Her BNP is low at 123 with negative Troponin.  -Cont IV Lasix per above, improving  Nonischemic cardiomyopathy (HCC) S/p biventricular ICD. Stable with negative troponin.      Subjective: Seen this AM, reports breathing better  Physical Exam: Vitals:   10/09/21 1124 10/09/21 1303 10/09/21 1355 10/09/21 1554  BP:  (!) 143/69 116/66 138/70  Pulse:  69 65 64  Resp: '16 13 14 18  '$ Temp:   97.9 F (36.6 C) 98.1 F (36.7 C)  TempSrc:   Oral Oral  SpO2:  100% 100% 98%  Weight:      Height:       General exam: Awake, laying in bed, in nad Respiratory system: Normal respiratory effort, no wheezing Cardiovascular system: regular rate, s1, s2 Gastrointestinal system: Soft, nondistended, positive BS Central nervous system: CN2-12 grossly intact, strength intact Extremities: Perfused, no clubbing Skin: Normal skin turgor, no notable skin lesions seen Psychiatry: Mood normal // no visual hallucinations   Data Reviewed:  Labs reviewed:   Family Communication: Na 138, Cr 0.98  Disposition: Status is: Inpatient Remains inpatient appropriate because: Severity of illness  Planned Discharge Destination: Home   Author: Marylu Lund, MD 10/09/2021 5:22 PM  For on call review [email or web address].

## 2021-10-09 NOTE — ED Notes (Signed)
Pt transported to ultrasound.

## 2021-10-10 DIAGNOSIS — G894 Chronic pain syndrome: Secondary | ICD-10-CM | POA: Diagnosis not present

## 2021-10-10 DIAGNOSIS — Z95810 Presence of automatic (implantable) cardiac defibrillator: Secondary | ICD-10-CM | POA: Diagnosis not present

## 2021-10-10 DIAGNOSIS — J90 Pleural effusion, not elsewhere classified: Secondary | ICD-10-CM | POA: Diagnosis not present

## 2021-10-10 NOTE — Progress Notes (Signed)
  Progress Note   Patient: [name] QHU:[id number] DOB: Aug 11, 1928 DOA: 10/08/2021     2 DOS: the patient was seen and examined on 10/10/2021   Brief hospital course: 88 y.o. female with medical history significant of chronic diastolic CHF, nonischemic cardiomyopathy s/p biventricular ICD, COPD with chronic hypoxemic respiratory failure on 2 L, hypothyroidism, hypertension, hyperlipidemia and dementia who presents with increasing shortness of breath. Pt found to have effusion on chest imaging and concerns for volume overload  Assessment and Plan: * Pleural effusion -Increasing left sided moderate pleural effusion noted on imaging -No fever or leukocytosis to suggest infection. Previous pneumonia was right sided.  -US guided thoracentesis was ordered, however limited US chest shows only very small, thin pocket of fluid. No procedure performed -Pt is diuresing well and improving with IV lasix, will continue -Recheck bmet in AM  History of UTI -Previous E.coli UTI. Continue Augmentin until 7/16.  History of pneumonia Recent right sided pneumonia-resolving on imaging. Continue Augmentin until 7/16.  Constipation -She reports diarrhea but CT abd/pelvis is showing increase stool burden.  -Will continue home stool softener regimen.  -Would give cathartics as needed  Acute on chronic respiratory failure with hypoxia (HCC) - Baseline on 2 L in the setting of COPD with chronic hypoxemic respiratory failure.  Requiring 4 L on presentation. -Noted to be on 3LNC when seen. Cont to wean to baseline O2  Chronic pain syndrome Continue home oxycodone, gabapentin  Hypothyroidism Profound hypothyroidism -Previously with TSH of 80 on recent admission due to missing her Synthroid for 3 weeks and was resumed on discharge.  TSH is improving now at 56.  Continue Synthroid and recommend follow-up outpatient in about 4 weeks.  Acute on Chronic diastolic heart failure (HCC) Unclear if increasing  pleural effusion is due to acute CHF. Her BNP is low at 123 with negative Troponin.  -Cont IV Lasix as per above  Nonischemic cardiomyopathy ([place]) S/p biventricular ICD. Stable with negative troponin.      Subjective: Feels somewhat better today  Physical Exam: Vitals:   10/09/21 2054 10/09/21 2344 10/10/21 0501 10/10/21 1355  BP: 128/85 132/72 (!) 152/68 (!) 119/53  Pulse: 66 65 64 67  Resp: '18 18 18 16  '$ Temp: 97.9 F (36.6 C) 97.8 F (36.6 C) 98.3 F (36.8 C) 98.2 F (36.8 C)  TempSrc: Oral Oral Oral Oral  SpO2: 100% 100% 97% 100%  Weight:      Height:       General exam: Conversant, in no acute distress Respiratory system: normal chest rise, clear, no audible wheezing Cardiovascular system: regular rhythm, s1-s2 Gastrointestinal system: Nondistended, nontender, pos BS Central nervous system: No seizures, no tremors Extremities: No cyanosis, no joint deformities Skin: No rashes, no pallor Psychiatry: Affect normal // no auditory hallucinations   Data Reviewed:  Labs reviewed:Na 138, K 3.9, Cr 0.98   Family Communication: Pt in room, pt's son at bedside  Disposition: Status is: Inpatient Remains inpatient appropriate because: Severity of illness  Planned Discharge Destination: Home   Author: Marylu Lund, MD 10/10/2021 4:17 PM  For on call review [email or web address].

## 2021-10-10 NOTE — Plan of Care (Signed)

## 2021-10-10 NOTE — Progress Notes (Signed)
PT Cancellation Note / Screen  Patient Details Name: [name] MRN: [id number] DOB: 1928/06/02   Cancelled Treatment:    Reason Eval/Treat Not Completed: PT screened, no needs identified, will sign off Per admission a few days ago, PT ordered and note states: "Pt politely deferring mobility and states that she does not want therapy services during stay. Reports that she is bed bound at home and has assist from daughter and granddaughter for all mobility and ADLs. Has adjustable bed, wheelchair, and manual lift." Pt does not appear appropriate for skilled PT services.  Myrtis Hopping [place] 10/10/2021, 9:09 AM Arlyce Dice, DPT Physical Therapist Acute [facility] Services Preferred contact method: Secure Chat Weekend Pager Only: [phone] Office: [phone]

## 2021-10-11 ENCOUNTER — Inpatient Hospital Stay (HOSPITAL_COMMUNITY): Payer: Medicare Other

## 2021-10-11 DIAGNOSIS — R9431 Abnormal electrocardiogram [ECG] [EKG]: Secondary | ICD-10-CM | POA: Diagnosis not present

## 2021-10-11 DIAGNOSIS — J90 Pleural effusion, not elsewhere classified: Secondary | ICD-10-CM | POA: Diagnosis not present

## 2021-10-11 LAB — CBC
HCT: 33.9 % — ABNORMAL LOW (ref 36.0–46.0)
Hemoglobin: 10.7 g/dL — ABNORMAL LOW (ref 12.0–15.0)
MCH: 31.8 pg (ref 26.0–34.0)
MCHC: 31.6 g/dL (ref 30.0–36.0)
MCV: 100.9 fL — ABNORMAL HIGH (ref 80.0–100.0)
Platelets: 125 10*3/uL — ABNORMAL LOW (ref 150–400)
RBC: 3.36 MIL/uL — ABNORMAL LOW (ref 3.87–5.11)
RDW: 14.2 % (ref 11.5–15.5)
WBC: 7.4 10*3/uL (ref 4.0–10.5)
nRBC: 0 % (ref 0.0–0.2)

## 2021-10-11 LAB — COMPREHENSIVE METABOLIC PANEL
ALT: 11 U/L (ref 0–44)
AST: 17 U/L (ref 15–41)
Albumin: 3.2 g/dL — ABNORMAL LOW (ref 3.5–5.0)
Alkaline Phosphatase: 41 U/L (ref 38–126)
Anion gap: 7 (ref 5–15)
BUN: 26 mg/dL — ABNORMAL HIGH (ref 8–23)
CO2: 40 mmol/L — ABNORMAL HIGH (ref 22–32)
Calcium: 8.8 mg/dL — ABNORMAL LOW (ref 8.9–10.3)
Chloride: 88 mmol/L — ABNORMAL LOW (ref 98–111)
Creatinine, Ser: 1.4 mg/dL — ABNORMAL HIGH (ref 0.44–1.00)
GFR, Estimated: 36 mL/min — ABNORMAL LOW (ref >60)
Glucose, Bld: 120 mg/dL — ABNORMAL HIGH (ref 70–99)
Potassium: 4.2 mmol/L (ref 3.5–5.1)
Sodium: 135 mmol/L (ref 135–145)
Total Bilirubin: 0.3 mg/dL (ref 0.3–1.2)
Total Protein: 6.5 g/dL (ref 6.5–8.1)

## 2021-10-11 LAB — ECHOCARDIOGRAM COMPLETE
Area-P 1/2: 2.87 cm2
Height: 62 in
MV VTI: 2.16 cm2
P 1/2 time: 593 msec
S' Lateral: 2.3 cm
Weight: 2160 oz

## 2021-10-11 MED ORDER — SODIUM CHLORIDE 0.9 % IV SOLN: INTRAVENOUS | Status: DC

## 2021-10-11 MED ORDER — AMOXICILLIN-POT CLAVULANATE 500-125 MG PO TABS
1.0000 | ORAL_TABLET | Freq: Two times a day (BID) | ORAL | Status: DC
  Administered 2021-10-11 – 2021-10-12 (×2): 500 mg via ORAL
  Filled 2021-10-11 (×2): qty 1

## 2021-10-11 MED ORDER — ENSURE ENLIVE PO LIQD
237.0000 mL | Freq: Two times a day (BID) | ORAL | Status: DC
  Administered 2021-10-11 – 2021-10-12 (×3): 237 mL via ORAL

## 2021-10-11 MED ORDER — ADULT MULTIVITAMIN W/MINERALS CH
1.0000 | ORAL_TABLET | Freq: Every day | ORAL | Status: DC
  Administered 2021-10-12: 1 via ORAL
  Filled 2021-10-11: qty 1

## 2021-10-11 MED ORDER — HYDROCORTISONE ACETATE 25 MG RE SUPP: 25.0000 mg | Freq: Two times a day (BID) | RECTAL | Status: DC | PRN

## 2021-10-11 NOTE — Progress Notes (Signed)
PT Cancellation/Screen Note  Patient Details Name: [name] MRN: [id number] DOB: June 17, 1928   Cancelled Treatment:    Reason Eval/Treat Not Completed: PT screened, no needs identified, will sign off. PT screened on today, 7/15, and 7/11.  No needs identified, will sign off. Patient is bed bound at baseline and has assistance of daughter for ADLs. Patient assists as she can at bed level. Patient is at her baseline lin regards to functional abilities.    [facility]  Office: [phone] Pager: [phone]

## 2021-10-11 NOTE — Progress Notes (Signed)
Initial Nutrition Assessment  DOCUMENTATION CODES:   Not applicable  INTERVENTION:   Ensure Enlive po BID, each supplement provides 350 kcal and 20 grams of protein.  MVI po daily   Pt at high refeed risk; recommend monitor potassium, magnesium and phosphorus labs daily until stable  NUTRITION DIAGNOSIS:   Increased nutrient needs related to acute illness as evidenced by estimated needs.  GOAL:   Patient will meet greater than or equal to 90% of their needs  MONITOR:   PO intake, Supplement acceptance, Labs, Weight trends, Skin, I & O's  REASON FOR ASSESSMENT:   Malnutrition Screening Tool    ASSESSMENT:   88 y.o. female with medical history significant of chronic diastolic CHF, nonischemic cardiomyopathy s/p biventricular ICD, COPD with chronic hypoxemic respiratory failure on 2 L, hypothyroidism, hypertension, hyperlipidemia, dementia  and recent admission for UTI and PNA who is admitted with pleural effusion.  RD working remotely.  Unable to reach pt by phone. Per chart review, pt eating 100% of meals in hospital. Per chart, pt is down 5lbs(3%) over the past week; this is significant weight loss. RD will add supplements and MVI to help pt meet her estimated needs. Suspect pt is a refeed risk. RD will obtain nutrition related history and exam at follow-up.   Medications reviewed and include: augmentin, colace, synthroid, oxycodone, miralax  Labs reviewed: K 4.2 wnl, BUN 26(H), creat 1.40(H) Hgb 10.7(L), Hct 33.9(L)  NUTRITION - FOCUSED PHYSICAL EXAM: Unable to perform at this time   Diet Order:   Diet Order             Diet regular Room service appropriate? Yes; Fluid consistency: Thin  Diet effective now                  EDUCATION NEEDS:   No education needs have been identified at this time  Skin:  Skin Assessment: Reviewed RN Assessment (ecchymosis)  Last BM:  7/16- type 7  Height:   Ht Readings from Last 1 Encounters:  10/08/21 '5\' 2"'$  (1.575  m)    Weight:   Wt Readings from Last 1 Encounters:  10/08/21 61.2 kg    Ideal Body Weight:  50 kg  BMI:  Body mass index is 24.69 kg/m.  Estimated Nutritional Needs:   Kcal:  1400-1600kcal/day  Protein:  70-80g/day  Fluid:  1.3-1.5L/day  Koleen Distance MS, RD, LDN Please refer to [facility] for RD and/or RD on-call/weekend/after hours pager

## 2021-10-11 NOTE — Progress Notes (Signed)
PHARMACY NOTE:  ANTIMICROBIAL RENAL DOSAGE ADJUSTMENT  Current antimicrobial regimen includes a mismatch between antimicrobial dosage and estimated renal function.  As per policy approved by the Pharmacy & Therapeutics and Medical Executive Committees, the antimicrobial dosage will be adjusted accordingly.  Current antimicrobial dosage:  Augmentin 875/125 BID   Indication: UTI  Renal Function:  Estimated Creatinine Clearance: 22.5 mL/min (A) (by C-G formula based on SCr of 1.4 mg/dL (H)). '[]'$      On intermittent HD, scheduled: '[]'$      On CRRT    Antimicrobial dosage has been changed to:  Augmentin 500/125 mg BID   Additional comments:   Thank you for allowing pharmacy to be a part of this patient's care.    Royetta Asal, PharmD, BCPS 10/11/2021 1:25 PM

## 2021-10-11 NOTE — Progress Notes (Signed)
OT Cancellation Note  Patient Details Name: [name] MRN: [id number] DOB: 29-Nov-1928   Cancelled Treatment:    Reason Eval/Treat Not Completed: OT screened, no needs identified, will sign off. Patient is bed bound at baseline and has assistance of daughter for ADLs. Patient assists as she can at bed level. Patient is at her baseline lin regards to functional abilities.   Jazzman Loughmiller L Talley Kreiser 10/11/2021, 9:44 AM

## 2021-10-11 NOTE — Progress Notes (Signed)
PROGRESS NOTE  [name]  DOB: 25-Jan-1929  PCP: Carolee Rota, NP HCW:[id number]  DOA: 10/08/2021  LOS: 3 days  Hospital Day: 4  Brief narrative: [name] is a 88 y.o. female with PMH significant for dementia, stage IV ovarian cancer, HTN, chronic diastolic CHF, nonischemic cardiomyopathy status post biventricular ICD, COPD, on 2 L oxygen at home, hypothyroidism, peripheral neuropathy. Recently hospitalized 7/8 to 7/11 for intractable nausea vomiting due to E. coli UTI, profound hypothyroidism, pneumonia and discharged to home on oral Augmentin Patient presented to the ED from home on 7/13 with complaint of nausea, worsening weakness since discharge from the hospital.  In the ED, patient was afebrile, blood pressure elevated to 190, O2 sat low in 80s on 2 L and had to be increased to 4 L. Labs without leukocytosis, mild anemia with hemoglobin of 11.8, mild thrombocytopenia of 118, lactate normal, BMP unremarkable TSH had improved from a superhigh level of 80 previously to 56 with Synthroid Urinalysis unremarkable CTA chest/abdomen/pelvis was negative for pulmonary embolism but showed moderate left pleural effusion that has enlarged since prior study on 7/8.  Admitted to hospitalist service   Subjective: Patient was seen and examined this morning.  Elderly Caucasian female.  Propped up in bed.  Was asleep.  I had to shake her body and wake her up.  She started asking for pain medicine soon after that. Chart reviewed Hemodynamically stable in last 24 hours. Labs this morning with creatinine significantly up to 1.4  Assessment and plan: Left pleural effusion Recent right sided pneumonia Acute on chronic respiratory failure with hypoxia -CT chest on admission showed moderate left sided pleural effusion increased in size compared to previous imaging.  Recent pneumonia was on right upper lobe and hence it is unlikely to be parapneumonic effusion.  To complete the course of  Augmentin today 7/16 as recommended in last admission. -Ultrasound thoracentesis was ordered but it noted that the pleural effusion was small in size and hence could not be done -Chronically on 2 L oxygen at home.  Initially required higher oxygen level at 4 L/min.  Down to baseline requirement now. Recent Labs  Lab 10/05/21 0819 10/08/21 1239 10/11/21 0517  WBC 4.8 5.2 7.4   Bronchiectasis -CT abdomen from previous admission on 7/8 showed bronchiectasis with airway impaction in the inferior right middle and lower lobes -Encourage chest physical therapy, flutter valve, Mucinex  Chronic diastolic CHF Nonischemic cardiomyopathy s/p BiV pacemaker Essential hypertension -Initial shortness of breath without evidence of volume overload.  BNP slightly elevated to 123 -Last echo in the system is from 2021.  Obtain new echocardiogram. -Patient was given IV Lasix 40 mg daily for last 2 days.  Creatinine rose up to 1.40 today.  We will hold diuresis at this time. -Continue monitor clinically.  AKI -Creatinine normal at baseline.  Elevated 1.4 today with diuresis.  Hold off diuresis.  She has poor oral intake.  Start on normal saline at 75 mill per hour. Continue to monitor. Recent Labs    01/12/21 1727 10/03/21 1238 10/05/21 0350 10/08/21 1239 10/09/21 0500 10/11/21 0517  BUN 38* 21 24* 19 15 26*  CREATININE 0.93 0.81 1.21* 0.94 0.98 1.40*   Pulmonary nodules -CT scan 7/8 showed 8 mm and 5 mm sized pulm nodules in the right lower lobes.  Recommend repeat CT chest in 3 to 6 months.  Chronic pain syndrome -Continue home oxycodone, gabapentin  Constipation -Bowel regimen   Profound hypothyroidism -Previously with TSH of 80  on recent admission due to missing her Synthroid for 3 weeks and was resumed on discharge.  TSH is improving now at 56.  Continue Synthroid and recommend follow-up outpatient in about 4 weeks. Recent Labs    10/04/21 0341 10/08/21 1239  TSH 80.212* 56.433*    Goals of care   Code Status: DNR    Mobility: Encourage ambulation  Skin assessment:     Nutritional status:  Body mass index is 24.69 kg/m.  Nutrition Problem: Increased nutrient needs Etiology: acute illness Signs/Symptoms: estimated needs     Diet:  Diet Order             Diet regular Room service appropriate? Yes; Fluid consistency: Thin  Diet effective now                   DVT prophylaxis:  SCDs Start: 10/08/21 2236   Antimicrobials: Augmentin Fluid: None Consultants: None Family Communication: None at bedside  Status is: Inpatient  Continue in-hospital care because: AKI requiring IV fluid  Level of care: Telemetry   Dispo: The patient is from: Home              Anticipated d/c is to: Pending clinical course and PT recommendation              Patient currently is not medically stable to d/c.   Difficult to place patient No     Infusions:   sodium chloride      Scheduled Meds:  amoxicillin-clavulanate  1 tablet Oral BID   carvedilol  12.5 mg Oral BID WC   docusate sodium  100 mg Oral Q12H   dorzolamide-timolol  1 drop Both Eyes BID   DULoxetine  60 mg Oral Daily   feeding supplement  237 mL Oral BID BM   latanoprost  1 drop Both Eyes QHS   levothyroxine  112 mcg Oral Q0600   [START ON 10/12/2021] multivitamin with minerals  1 tablet Oral Daily   oxyCODONE  15 mg Oral Q6H   polyethylene glycol  17 g Oral Daily    PRN meds: gabapentin, hydrocortisone, mometasone-formoterol, traZODone   Antimicrobials: Anti-infectives (From admission, onward)    Start     Dose/Rate Route Frequency Ordered Stop   10/11/21 2200  amoxicillin-clavulanate (AUGMENTIN) 500-125 MG per tablet 500 mg        1 tablet Oral 2 times daily 10/11/21 1325     10/08/21 2245  amoxicillin-clavulanate (AUGMENTIN) 875-125 MG per tablet 1 tablet  Status:  Discontinued        1 tablet Oral 2 times daily 10/08/21 2238 10/11/21 1325       Objective: Vitals:    10/10/21 2211 10/11/21 0620  BP: (!) 108/54 138/82  Pulse: 64 63  Resp: 16 18  Temp: 98.3 F (36.8 C) 98 F (36.7 C)  SpO2: 97% 92%    Intake/Output Summary (Last 24 hours) at 10/11/2021 1417 Last data filed at 10/11/2021 0100 Gross per 24 hour  Intake --  Output 450 ml  Net -450 ml   Filed Weights   10/08/21 1237  Weight: 61.2 kg   Weight change:  Body mass index is 24.69 kg/m.   Physical Exam: General exam: Elderly Caucasian female. Skin: No rashes, lesions or ulcers. HEENT: Atraumatic, normocephalic, no obvious bleeding Lungs: Clear to auscultation bilaterally CVS: Regular rate and rhythm, no murmur GI/Abd soft, nontender, nondistended, bowel sound present. CNS: Sleepy, opens eyes to verbal command.  Falls back to sleep quickly Psychiatry: Sad  affect Extremities: No pedal edema, no calf tenderness  Data Review: I have personally reviewed the laboratory data and studies available.  F/u labs ordered Unresulted Labs (From admission, onward)     Start     Ordered   10/12/21 0500  CBC with Differential/Platelet  Tomorrow morning,   R        10/11/21 1417   10/12/21 4830  Basic metabolic panel  Tomorrow morning,   R        10/11/21 1417            Signed, Terrilee Croak, MD [facility] 10/11/2021

## 2021-10-11 NOTE — TOC Initial Note (Signed)
Transition of Care [facility]) - Initial/Assessment Note    Patient Details  Name: [name] MRN: [id number] Date of Birth: May 03, 1928  [facility] CM/SW Contact:    Dessa Phi, RN Phone Number: 10/11/2021, 2:51 PM  Clinical Narrative:  d/c plan return home w/family to asst. PTAR @ d/c-confirmed address on demographics.                 Expected Discharge Plan: Home/Self Care Barriers to Discharge: Continued Medical Work up   Patient Goals and CMS Choice Patient states their goals for this hospitalization and ongoing recovery are:: Home CMS Medicare.gov Compare Post Acute Care list provided to:: Patient Represenative (must comment) ([name] (dtr)) Choice offered to / list presented to : Adult Children  Expected Discharge Plan and Services Expected Discharge Plan: Home/Self Care   Discharge Planning Services: CM Consult Post Acute Care Choice: Resumption of Svcs/PTA Provider Living arrangements for the past 2 months: Single Family Home                                      Prior Living Arrangements/Services Living arrangements for the past 2 months: Single Family Home Lives with:: Adult Children Patient language and need for interpreter reviewed:: Yes Do you feel safe going back to the place where you live?: Yes      Need for Family Participation in Patient Care: Yes (Comment) Care giver support system in place?: Yes (comment) Current home services: DME (w/c hoyer lift) Criminal Activity/Legal Involvement Pertinent to Current Situation/Hospitalization: No - Comment as needed  Activities of Daily Living Home Assistive Devices/Equipment: Eyeglasses, Dentures (specify type), Oxygen, Wheelchair ADL Screening (condition at time of admission) Patient's cognitive ability adequate to safely complete daily activities?: Yes Is the patient deaf or have difficulty hearing?: No Does the patient have difficulty seeing, even when wearing glasses/contacts?: No Does  the patient have difficulty concentrating, remembering, or making decisions?: Yes Patient able to express need for assistance with ADLs?: Yes Does the patient have difficulty dressing or bathing?: Yes Independently performs ADLs?: No Communication: Independent Dressing (OT): Needs assistance Is this a change from baseline?: Pre-admission baseline Grooming: Needs assistance Is this a change from baseline?: Pre-admission baseline Feeding: Independent Bathing: Needs assistance Is this a change from baseline?: Pre-admission baseline Toileting: Needs assistance Is this a change from baseline?: Pre-admission baseline In/Out Bed: Needs assistance Is this a change from baseline?: Pre-admission baseline Walks in Home: Dependent Is this a change from baseline?: Pre-admission baseline Does the patient have difficulty walking or climbing stairs?: Yes Weakness of Legs: Both Weakness of Arms/Hands: Both  Permission Sought/Granted Permission sought to share information with : Case Manager Permission granted to share information with : Yes, Verbal Permission Granted  Share Information with NAME: Case Manager           Emotional Assessment Appearance:: Appears stated age Attitude/Demeanor/Rapport: Gracious Affect (typically observed): Accepting Orientation: : Oriented to Self Alcohol / Substance Use: Not Applicable Psych Involvement: No (comment)  Admission diagnosis:  Nausea [R11.0] SOB (shortness of breath) [R06.02] Pleural effusion [J90] Mid back pain [M54.9] Acute on chronic respiratory failure with hypoxia (HCC) [J96.21] Patient Active Problem List   Diagnosis Date Noted   Acute on chronic respiratory failure with hypoxia (HCC) 10/08/2021   Pleural effusion 10/08/2021   Constipation 10/08/2021   History of pneumonia 10/08/2021   History of UTI 10/08/2021   Severe hypothyroidism 10/04/2021  Intractable nausea and vomiting 10/03/2021   Frequent falls 05/17/2020   Macrocytosis  05/17/2020   Chronic pain syndrome 05/17/2020   Acute on chronic heart failure with preserved ejection fraction (HFpEF) ([place]) 02/11/2020   Essential hypertension 02/11/2020   Fracture of left ankle, closed, initial encounter 02/11/2020   Pacemaker 02/11/2020   Fall at home, initial encounter 02/11/2020   Prolonged QT interval 02/11/2020   ICD (implantable cardioverter-defibrillator) battery depletion    Pacemaker battery depletion 09/09/2019   Acute respiratory failure ([place]) 03/15/2019   COPD exacerbation ([place]) 03/15/2019   Acute respiratory failure with hypoxia ([place]) 03/14/2019   COPD with acute exacerbation ([place]) 03/14/2019   Digoxin toxicity 10/04/2017   Closed fracture of distal end of radius 06/29/2017   Acute encephalopathy    AKI (acute kidney injury) ([place])    Dysarthria 09/29/2015   Abnormal liver function 09/29/2015   Acute renal failure (ARF) ([place]) 09/29/2015   Hyponatremia 09/29/2015   Small bowel obstruction ([place]) 05/17/2015   Hypothyroidism 05/17/2015   Uncontrolled hypertension 05/17/2015   Hypocalcemia 05/17/2015   Leukocytosis 05/17/2015   Paroxysmal atrial flutter ([place]) 04/24/2015   Incarcerated incisional hernia 03/28/2015   SBO (small bowel obstruction) ([place]) 03/28/2015   Nonischemic cardiomyopathy ([place]) 11/27/2012   Biventricular ICD  11/27/2012   Chronic diastolic heart failure ([place]) 11/27/2012   Peripheral neuropathy secondary to chemotherapy 11/27/2012   Hyperlipidemia 11/27/2012   COPD (chronic obstructive pulmonary disease) ([place]) 12/03/2011   DOE (dyspnea on exertion) 11/05/2011   PCP:  Carolee Rota, NP Pharmacy:   [place], [place] - [address] [place] [address] Phone: [phone] Fax: [phone]     Social Determinants of Health (SDOH) Interventions    Readmission Risk Interventions    05/21/2020    2:10 PM  Readmission Risk Prevention Plan  Transportation Screening Complete  PCP or  Specialist Appt within 3-5 Days Complete  HRI or [place] Complete  Social Work Consult for [place] Planning/Counseling Complete  Medication Review Press photographer) Complete

## 2021-10-11 NOTE — Plan of Care (Signed)

## 2021-10-12 ENCOUNTER — Other Ambulatory Visit (HOSPITAL_COMMUNITY): Payer: Self-pay

## 2021-10-12 DIAGNOSIS — J90 Pleural effusion, not elsewhere classified: Secondary | ICD-10-CM | POA: Diagnosis not present

## 2021-10-12 LAB — BASIC METABOLIC PANEL
Anion gap: 7 (ref 5–15)
BUN: 22 mg/dL (ref 8–23)
CO2: 39 mmol/L — ABNORMAL HIGH (ref 22–32)
Calcium: 8.9 mg/dL (ref 8.9–10.3)
Chloride: 91 mmol/L — ABNORMAL LOW (ref 98–111)
Creatinine, Ser: 1.1 mg/dL — ABNORMAL HIGH (ref 0.44–1.00)
GFR, Estimated: 47 mL/min — ABNORMAL LOW (ref >60)
Glucose, Bld: 118 mg/dL — ABNORMAL HIGH (ref 70–99)
Potassium: 4.3 mmol/L (ref 3.5–5.1)
Sodium: 137 mmol/L (ref 135–145)

## 2021-10-12 LAB — CBC WITH DIFFERENTIAL/PLATELET
Abs Immature Granulocytes: 0.03 10*3/uL (ref 0.00–0.07)
Basophils Absolute: 0 10*3/uL (ref 0.0–0.1)
Basophils Relative: 1 %
Eosinophils Absolute: 0.2 10*3/uL (ref 0.0–0.5)
Eosinophils Relative: 3 %
HCT: 33.2 % — ABNORMAL LOW (ref 36.0–46.0)
Hemoglobin: 10.3 g/dL — ABNORMAL LOW (ref 12.0–15.0)
Immature Granulocytes: 0 %
Lymphocytes Relative: 16 %
Lymphs Abs: 1.3 10*3/uL (ref 0.7–4.0)
MCH: 31.3 pg (ref 26.0–34.0)
MCHC: 31 g/dL (ref 30.0–36.0)
MCV: 100.9 fL — ABNORMAL HIGH (ref 80.0–100.0)
Monocytes Absolute: 1 10*3/uL (ref 0.1–1.0)
Monocytes Relative: 12 %
Neutro Abs: 5.3 10*3/uL (ref 1.7–7.7)
Neutrophils Relative %: 68 %
Platelets: 128 10*3/uL — ABNORMAL LOW (ref 150–400)
RBC: 3.29 MIL/uL — ABNORMAL LOW (ref 3.87–5.11)
RDW: 14 % (ref 11.5–15.5)
WBC: 7.8 10*3/uL (ref 4.0–10.5)
nRBC: 0 % (ref 0.0–0.2)

## 2021-10-12 MED ORDER — IPRATROPIUM-ALBUTEROL 0.5-2.5 (3) MG/3ML IN SOLN
3.0000 mL | Freq: Four times a day (QID) | RESPIRATORY_TRACT | 2 refills | Status: AC | PRN
  Filled 2021-10-12: qty 360, 30d supply, fill #0

## 2021-10-12 MED ORDER — ENSURE ENLIVE PO LIQD
237.0000 mL | Freq: Two times a day (BID) | ORAL | 12 refills | Status: DC
  Filled 2021-10-12: qty 237, 1d supply, fill #0

## 2021-10-12 MED ORDER — HYDROCORTISONE ACETATE 25 MG RE SUPP
25.0000 mg | Freq: Two times a day (BID) | RECTAL | 0 refills | Status: DC | PRN
  Filled 2021-10-12: qty 12, 6d supply, fill #0

## 2021-10-12 NOTE — Care Management Important Message (Signed)
Important Message  Patient Details IM Letter given to the Patient. Name: [name] MRN: [id number] Date of Birth: 18-Sep-1928   Medicare Important Message Given:  Yes     Kerin Salen 10/12/2021, 10:13 AM

## 2021-10-12 NOTE — Discharge Summary (Signed)
Physician Discharge Summary  [name] ZGY:[id number] DOB: March 08, 1929 DOA: 10/08/2021  PCP: Carolee Rota, NP  Admit date: 10/08/2021 Discharge date: 10/12/2021  Admitted From: Home Discharge disposition: Home  Brief narrative: [name] is a 88 y.o. female with PMH significant for dementia, stage IV ovarian cancer, HTN, chronic diastolic CHF, nonischemic cardiomyopathy status post biventricular ICD, COPD, on 2 L oxygen at home, hypothyroidism, peripheral neuropathy. Recently hospitalized 7/8 to 7/11 for intractable nausea vomiting due to E. coli UTI, profound hypothyroidism, pneumonia and discharged to home on oral Augmentin Patient presented to the ED from home on 7/13 with complaint of nausea, worsening weakness since discharge from the hospital.  In the ED, patient was afebrile, blood pressure elevated to 190, O2 sat low in 80s on 2 L and had to be increased to 4 L. Labs without leukocytosis, mild anemia with hemoglobin of 11.8, mild thrombocytopenia of 118, lactate normal, BMP unremarkable TSH had improved from a superhigh level of 80 previously to 56 with Synthroid Urinalysis unremarkable CTA chest/abdomen/pelvis was negative for pulmonary embolism but showed moderate left pleural effusion that has enlarged since prior study on 7/8.  Admitted to hospitalist service   Subjective: Patient was seen and examined this morning.  Elderly Caucasian female.  Propped up in bed.  More awake today.  Able to have a conversation.  Family not at bedside.   Hospital course: Left pleural effusion Recent right sided pneumonia Acute on chronic respiratory failure with hypoxia -CT chest on admission showed moderate left sided pleural effusion increased in size compared to previous imaging.  Recent pneumonia was on right upper lobe and hence it is unlikely to be parapneumonic effusion.  Patient completed the course of Augmentin today 7/16 as recommended in last admission. -Based on the  presence of enlarging left pleural effusion as reported on CT chest, ultrasound thoracentesis was ordered but it noted that the pleural effusion was small in size and hence patient did not need thoracentesis. -Chronically on 2 L oxygen at home.  Initially required higher oxygen level at 4 L/min.  Down to baseline requirement now. Recent Labs  Lab 10/08/21 1239 10/11/21 0517 10/12/21 0527  WBC 5.2 7.4 7.8   Bronchiectasis -CT abdomen from previous admission on 7/8 showed bronchiectasis with airway impaction in the inferior right middle and lower lobes -Encourage chest physical therapy, flutter valve, Mucinex.  Nebulizer and DuoNeb ordered at patient's request.  Chronic diastolic CHF Nonischemic cardiomyopathy s/p BiV pacemaker Essential hypertension -Initial shortness of breath without evidence of volume overload.  BNP slightly elevated to 123 -Echocardiogram 7/16 with EF more than 75%, no regional wall motion abnormalities, severe concentric LVH, severe MAC -Patient was given IV Lasix 40 mg daily for last 2 days.  Creatinine rose up to 1.40 yesterday 7/16.  Patient was given IV fluid.  Creatinine improved close to normal today. -Continue Lasix oral 40 mg daily at home.  AKI -Creatinine normal at baseline.  Elevated 1.4 today with diuresis.  Diuretics was held.  IV fluid was given.  Creatinine improved.   Recent Labs    01/12/21 1727 10/03/21 1238 10/05/21 0350 10/08/21 1239 10/09/21 0500 10/11/21 0517 10/12/21 0527  BUN 38* 21 24* 19 15 26* 22  CREATININE 0.93 0.81 1.21* 0.94 0.98 1.40* 1.10*   Pulmonary nodules -CT scan 7/8 showed 8 mm and 5 mm sized pulm nodules in the right lower lobes.  Recommend repeat CT chest in 3 to 6 months.  Chronic pain syndrome -Continue home oxycodone,  gabapentin  Constipation -Bowel regimen   Profound hypothyroidism -Previously with TSH of 80 on recent admission due to missing her Synthroid for 3 weeks and was resumed on discharge.  TSH is  improving now at 56.  Continue Synthroid and recommend follow-up outpatient in about 4 weeks. Recent Labs    10/04/21 0341 10/08/21 1239  TSH 80.212* 56.433*   Wounds:  - Wound / Incision (Open or Dehisced) 02/13/20 (MASD) Moisture Associated Skin Damage Buttocks Left;Right (Active)  Date First Assessed: 02/13/20   Wound Type: (MASD) Moisture Associated Skin Damage  Location: Buttocks  Location Orientation: Left;Right    Assessments 02/13/2020  8:30 AM 05/21/2020  7:27 AM  Dressing Type -- None  Site / Wound Assessment Pink --  Peri-wound Assessment Intact --  Treatment Cleansed --     No associated orders.     Wound / Incision (Open or Dehisced) 05/17/20 Buttocks Right skin tear 1 cm x 1 cm (Active)  Date First Assessed/Time First Assessed: 05/17/20 1830   Location: Buttocks  Location Orientation: Right  Wound Description (Comments): skin tear 1 cm x 1 cm  Present on Admission: Yes    Assessments 05/18/2020  9:48 PM 05/21/2020  7:27 AM  Dressing Type None None  Site / Wound Assessment Pink --     No associated orders.    Discharge Exam:   Vitals:   10/11/21 1500 10/11/21 2241 10/12/21 0610 10/12/21 0903  BP: 126/74 126/62 (!) 151/61   Pulse:  66 64 74  Resp:  16 18   Temp: 98.2 F (36.8 C) 98 F (36.7 C) 97.8 F (36.6 C)   TempSrc:  Oral Oral   SpO2:  95% 97% 98%  Weight:      Height:        Body mass index is 24.69 kg/m.   General exam: Elderly Caucasian female. Skin: No rashes, lesions or ulcers. HEENT: Atraumatic, normocephalic, no obvious bleeding Lungs: Clear to auscultation bilaterally CVS: Regular rate and rhythm, no murmur GI/Abd soft, nontender, nondistended, bowel sound present. CNS: Alert, awake, oriented x3.   Psychiatry: Sad affect Extremities: No pedal edema, no calf tenderness  Follow ups:    Follow-up Information     Carolee Rota, NP. Schedule an appointment as soon as possible for a visit in 2 days.   Specialty: Nurse  Practitioner Contact information: [place] [address] [phone]         Sanda Klein, MD .   Specialty: Cardiology Contact information: [address] [place] [address] [phone]         Deboraha Sprang, MD .   Specialty: Cardiology Contact information: [phone] [address] [place] [address] [phone]                 Discharge Instructions:   Discharge Instructions     Call MD for:  difficulty breathing, headache or visual disturbances   Complete by: As directed    Call MD for:  extreme fatigue   Complete by: As directed    Call MD for:  hives   Complete by: As directed    Call MD for:  persistant dizziness or light-headedness   Complete by: As directed    Call MD for:  persistant nausea and vomiting   Complete by: As directed    Call MD for:  severe uncontrolled pain   Complete by: As directed    Call MD for:  temperature >100.4   Complete  by: As directed    Diet - low sodium heart healthy   Complete by: As directed    Discharge instructions   Complete by: As directed    General discharge instructions: Follow with Primary MD Carolee Rota, NP in 7 days  Please request your PCP  to go over your hospital tests, procedures, radiology results at the follow up. Please get your medicines reviewed and adjusted.  Your PCP may decide to repeat certain labs or tests as needed. Do not drive, operate heavy machinery, perform activities at heights, swimming or participation in water activities or provide baby sitting services if your were admitted for syncope or siezures until you have seen by Primary MD or a Neurologist and advised to do so again. [place] Controlled Substance Reporting System database was reviewed. Do not drive, operate heavy machinery, perform activities at heights, swim, participate in water activities or provide baby-sitting services while on medications for pain, sleep and mood  until your outpatient physician has reevaluated you and advised to do so again.  You are strongly recommended to comply with the dose, frequency and duration of prescribed medications. Activity: As tolerated with Full fall precautions use walker/cane & assistance as needed Avoid using any recreational substances like cigarette, tobacco, alcohol, or non-prescribed drug. If you experience worsening of your admission symptoms, develop shortness of breath, life threatening emergency, suicidal or homicidal thoughts you must seek medical attention immediately by calling 911 or calling your MD immediately  if symptoms less severe. You must read complete instructions/literature along with all the possible adverse reactions/side effects for all the medicines you take and that have been prescribed to you. Take any new medicine only after you have completely understood and accepted all the possible adverse reactions/side effects.  Wear Seat belts while driving. You were cared for by a hospitalist during your hospital stay. If you have any questions about your discharge medications or the care you received while you were in the hospital after you are discharged, you can call the unit and ask to speak with the hospitalist or the covering physician. Once you are discharged, your primary care physician will handle any further medical issues. Please note that NO REFILLS for any discharge medications will be authorized once you are discharged, as it is imperative that you return to your primary care physician (or establish a relationship with a primary care physician if you do not have one).   For home use only DME Nebulizer machine   Complete by: As directed    Patient needs a nebulizer to treat with the following condition:  COPD (chronic obstructive pulmonary disease) ([place]) Bronchiectasis ([place])     Length of Need: Lifetime   Increase activity slowly   Complete by: As directed        Discharge Medications:    Allergies as of 10/12/2021       Reactions   Digitalis Other (See Comments)   Poisoned patient - patient was hospitalized   Losartan    Other reaction(s): Unknown        Medication List     STOP taking these medications    amoxicillin-clavulanate 875-125 MG tablet Commonly known as: AUGMENTIN   aspirin EC 81 MG tablet       TAKE these medications    carvedilol 12.5 MG tablet Commonly known as: COREG Take 1 tablet (12.5 mg total) by mouth in the morning and at bedtime.   docusate sodium 100 MG capsule Commonly known as: COLACE Take 1  capsule (100 mg total) by mouth every 12 (twelve) hours.   dorzolamide-timolol 22.3-6.8 MG/ML ophthalmic solution Commonly known as: COSOPT Place 1 drop into both eyes 2 (two) times daily.   Dulera 100-5 MCG/ACT Aero Generic drug: mometasone-formoterol Inhale 2 puffs into the lungs every 6 (six) hours as needed for wheezing or shortness of breath.   DULoxetine 60 MG capsule Commonly known as: CYMBALTA Take 60 mg by mouth daily.   feeding supplement Liqd Take 237 mLs by mouth 2 (two) times daily between meals.   furosemide 40 MG tablet Commonly known as: LASIX TAKE 1 TABLET BY MOUTH EVERY DAY   gabapentin 300 MG capsule Commonly known as: NEURONTIN Take 300 mg by mouth 3 (three) times daily as needed (For pain).   hydrocortisone 25 MG suppository Commonly known as: ANUSOL-HC Place 1 suppository (25 mg total) rectally 2 (two) times daily as needed for hemorrhoids or anal itching.   ipratropium-albuterol 0.5-2.5 (3) MG/3ML Soln Commonly known as: DUONEB Take 3 mLs by nebulization every 6 (six) hours as needed.   latanoprost 0.005 % ophthalmic solution Commonly known as: XALATAN Place 1 drop into both eyes at bedtime.   oxyCODONE 15 MG immediate release tablet Commonly known as: ROXICODONE Take 1 tablet (15 mg total) by mouth every 6 (six) hours as needed for pain. What changed:  when to take this additional  instructions   polyethylene glycol 17 g packet Commonly known as: MIRALAX / GLYCOLAX Take 17 g by mouth daily.   PRENATAL VITAMINS PO Take 1 tablet by mouth daily.   Synthroid 112 MCG tablet Generic drug: levothyroxine Take 112 mcg by mouth daily before breakfast.   traZODone 50 MG tablet Commonly known as: DESYREL Take 50 mg by mouth at bedtime as needed for sleep.   VITAMIN D3 PO Take 2 tablets by mouth daily.   Zinc Oxide 10 % Oint Apply 1 application topically 4 (four) times daily as needed (bed sores).               Durable Medical Equipment  (From admission, onward)           Start     Ordered   10/12/21 0000  For home use only DME Nebulizer machine       Question Answer Comment  Patient needs a nebulizer to treat with the following condition COPD (chronic obstructive pulmonary disease) ([place])   Patient needs a nebulizer to treat with the following condition Bronchiectasis ([place])   Length of Need Lifetime      10/12/21 1329             The results of significant diagnostics from this hospitalization (including imaging, microbiology, ancillary and laboratory) are listed below for reference.    Procedures and Diagnostic Studies:   [name] CHEST (PLEURAL EFFUSION)  Result Date: 10/09/2021 CLINICAL DATA:  Left pleural effusion. EXAM: CHEST ULTRASOUND COMPARISON:  CTA of the chest on 10/08/2021 FINDINGS: Ultrasound was performed demonstrating only a small volume left pleural effusion which is not large enough to perform thoracentesis. Atelectasis present at the left lung base. IMPRESSION: Small volume left pleural effusion. Thoracentesis was not performed. Electronically Signed   By: Aletta Edouard M.D.   On: 10/09/2021 12:18   CT Angio Chest/Abd/Pel for Dissection W and/or Wo Contrast  Result Date: 10/08/2021 CLINICAL DATA:  Acute aortic syndrome (AAS) suspected. Nausea, weakness. EXAM: CT ANGIOGRAPHY CHEST, ABDOMEN AND PELVIS TECHNIQUE: Non-contrast CT of  the chest was initially obtained. Multidetector CT imaging through the  chest, abdomen and pelvis was performed using the standard protocol during bolus administration of intravenous contrast. Multiplanar reconstructed images and MIPs were obtained and reviewed to evaluate the vascular anatomy. RADIATION DOSE REDUCTION: This exam was performed according to the departmental dose-optimization program which includes automated exposure control, adjustment of the mA and/or kV according to patient size and/or use of iterative reconstruction technique. CONTRAST:  153m OMNIPAQUE IOHEXOL 350 MG/ML SOLN COMPARISON:  10/03/2021 FINDINGS: CTA CHEST FINDINGS Cardiovascular: Heart is normal size. Densely calcified and tortuous aorta. No aneurysm or dissection. No filling defects in the pulmonary arteries to suggest pulmonary emboli. Scattered coronary artery calcifications. Pacer wires in the right heart. Prominent central pulmonary arteries compatible with pulmonary arterial hypertension, stable. Mediastinum/Nodes: No mediastinal, hilar, or axillary adenopathy. Trachea and esophagus are unremarkable. Thyroid unremarkable. Lungs/Pleura: Moderate left pleural effusion, enlarging since prior study. Compressive atelectasis in the left lower lobe. Trace right effusion with dependent atelectasis in the right lung base. Tree-in-bud nodular densities seen within the right upper lobe and right lower lobe, similar to prior study. These are most likely infectious/inflammatory small airways disease. Musculoskeletal: Chest wall soft tissues are unremarkable. Diffuse osteopenia. Moderate compression fracture at T12, stable. Diffuse degenerative changes. Review of the MIP images confirms the above findings. CTA ABDOMEN AND PELVIS FINDINGS VASCULAR Aorta: Heavily calcified and markedly tortuous. No aneurysm or dissection. Celiac: Patent SMA: Patent Renals: Patent bilaterally. IMA: Patent Inflow: Atherosclerotic calcifications.  No aneurysm or  dissection. Veins: No obvious venous abnormality within the limitations of this arterial phase study. Review of the MIP images confirms the above findings. NON-VASCULAR Hepatobiliary: No focal liver abnormality is seen. Status post cholecystectomy. No biliary dilatation. Pancreas: No focal abnormality or ductal dilatation. Spleen: No focal abnormality.  Normal size. Adrenals/Urinary Tract: Left kidney is atrophic. Mild fullness of the right renal collecting system. This is unchanged since prior study. Right ureter is decompressed. Mild fullness of the adrenal glands, stable. Urinary bladder unremarkable. Stomach/Bowel: Moderate stool throughout the colon. Mild gaseous distention of the right colon and transverse colon. No evidence of bowel obstruction. Stomach and small bowel decompressed, grossly unremarkable. Lymphatic: No adenopathy. Reproductive:  Prior hysterectomy.  No adnexal masses. Other: No free fluid or free air. Small left periumbilical hernia containing small bowel loops. Musculoskeletal: Scoliosis and severe degenerative changes in the lumbar spine. No acute bony abnormality. Review of the MIP images confirms the above findings. IMPRESSION: Markedly tortuous and calcified aorta. No evidence of aneurysm or dissection. Moderate left pleural effusion, increasing since prior study with compressive atelectasis in the left lower lobe. Trace right pleural effusion with dependent atelectasis in the right lower lobe. Tree-in-bud nodular densities in the right upper lobe and right lower lobe, likely infectious/inflammatory small airways disease. Findings similar to prior study. Atrophic left kidney. Mild fullness of the right renal collecting system/pelvis, similar prior study. This may reflect chronic UPJ obstruction. Small left paraumbilical ventral hernia containing small bowel loops. No bowel obstruction. Electronically Signed   By: KRolm BaptiseM.D.   On: 10/08/2021 17:35   DG Chest Portable 1  View  Result Date: 10/08/2021 CLINICAL DATA:  Altered mental status, nausea, weak S, CHF, COPD, hypertension EXAM: PORTABLE CHEST 1 VIEW COMPARISON:  Portable exam 1317 hours compared to 10/03/2021 FINDINGS: LEFT subclavian ICD leads project over RIGHT atrium, RIGHT ventricle, coronary sinus. Upper normal size of cardiac silhouette. Mediastinal contours and pulmonary vascularity normal. Atherosclerotic calcification aorta. LEFT pleural effusion and basilar atelectasis with mild elevation of LEFT diaphragm noted. Remaining  lungs clear. No acute infiltrate or pneumothorax. Bones demineralized. IMPRESSION: RIGHT pleural effusion and basilar atelectasis. Aortic Atherosclerosis (ICD10-I70.0). Electronically Signed   By: Lavonia Dana M.D.   On: 10/08/2021 13:26     Labs:   Basic Metabolic Panel: Recent Labs  Lab 10/08/21 1239 10/09/21 0500 10/11/21 0517 10/12/21 0527  NA 134* 138 135 137  K 4.4 3.9 4.2 4.3  CL 89* 91* 88* 91*  CO2 38* 37* 40* 39*  GLUCOSE 122* 99 120* 118*  BUN 19 15 26* 22  CREATININE 0.94 0.98 1.40* 1.10*  CALCIUM 9.0 8.4* 8.8* 8.9   GFR Estimated Creatinine Clearance: 28.7 mL/min (A) (by C-G formula based on SCr of 1.1 mg/dL (H)). Liver Function Tests: Recent Labs  Lab 10/08/21 1239 10/11/21 0517  AST 22 17  ALT 13 11  ALKPHOS 43 41  BILITOT 0.5 0.3  PROT 7.0 6.5  ALBUMIN 3.5 3.2*   Recent Labs  Lab 10/08/21 1239  LIPASE 26   No results for input(s): "AMMONIA" in the last 168 hours. Coagulation profile No results for input(s): "INR", "PROTIME" in the last 168 hours.  CBC: Recent Labs  Lab 10/08/21 1239 10/11/21 0517 10/12/21 0527  WBC 5.2 7.4 7.8  NEUTROABS  --   --  5.3  HGB 11.8* 10.7* 10.3*  HCT 36.6 33.9* 33.2*  MCV 98.9 100.9* 100.9*  PLT 118* 125* 128*   Cardiac Enzymes: No results for input(s): "CKTOTAL", "CKMB", "CKMBINDEX", "TROPONINI" in the last 168 hours. BNP: Invalid input(s): "POCBNP" CBG: No results for input(s): "GLUCAP" in  the last 168 hours. D-Dimer No results for input(s): "DDIMER" in the last 72 hours. Hgb A1c No results for input(s): "HGBA1C" in the last 72 hours. Lipid Profile No results for input(s): "CHOL", "HDL", "LDLCALC", "TRIG", "CHOLHDL", "LDLDIRECT" in the last 72 hours. Thyroid function studies No results for input(s): "TSH", "T4TOTAL", "T3FREE", "THYROIDAB" in the last 72 hours.  Invalid input(s): "FREET3" Anemia work up No results for input(s): "VITAMINB12", "FOLATE", "FERRITIN", "TIBC", "IRON", "RETICCTPCT" in the last 72 hours. Microbiology Recent Results (from the past 240 hour(s))  Blood Culture (routine x 2)     Status: None   Collection Time: 10/03/21  1:33 PM   Specimen: BLOOD  Result Value Ref Range Status   Specimen Description   Final    BLOOD BLOOD RIGHT FOREARM Performed at [facility] [address] [place], [place] [address]    Special Requests   Final    BOTTLES DRAWN AEROBIC AND ANAEROBIC Blood Culture results may not be optimal due to an inadequate volume of blood received in culture bottles Performed at [facility] [address] [place], [place] [address]    Culture   Final    NO GROWTH 5 DAYS Performed at [facility], [place] [address], [place], [place] [address]    Report Status 10/08/2021 FINAL  Final  Blood Culture (routine x 2)     Status: None   Collection Time: 10/03/21  1:38 PM   Specimen: BLOOD  Result Value Ref Range Status   Specimen Description   Final    BLOOD BLOOD LEFT FOREARM Performed at [facility] [address], [place], [place] [address]    Special Requests   Final    BOTTLES DRAWN AEROBIC ONLY Blood Culture results may not be optimal due to an inadequate volume of blood received in culture bottles Performed at [facility] [address] [place], [place]  [address]    Culture   Final    NO GROWTH 5 DAYS Performed at [facility], [place] [address] [place], [place] [address]    Report Status 10/08/2021 FINAL  Final  Urine Culture     Status: Abnormal   Collection Time: 10/03/21  3:57 PM   Specimen: Urine, Clean Catch  Result Value Ref Range Status   Specimen Description   Final    URINE, CLEAN CATCH Performed at [facility], [place] [address], [place], [place] [address]    Special Requests   Final    NONE Performed at [facility], [place] [address], [place], [place] [address]    Culture >=100,000 COLONIES/mL ESCHERICHIA COLI (A)  Final   Report Status 10/05/2021 FINAL  Final   Organism ID, Bacteria ESCHERICHIA COLI (A)  Final      Susceptibility   Escherichia coli - MIC*    AMPICILLIN 8 SENSITIVE Sensitive     CEFAZOLIN <=4 SENSITIVE Sensitive     CEFEPIME <=0.12 SENSITIVE Sensitive     CEFTRIAXONE <=0.25 SENSITIVE Sensitive     CIPROFLOXACIN <=0.25 SENSITIVE Sensitive     GENTAMICIN <=1 SENSITIVE Sensitive     IMIPENEM <=0.25 SENSITIVE Sensitive     NITROFURANTOIN <=16 SENSITIVE Sensitive     TRIMETH/SULFA <=20 SENSITIVE Sensitive     AMPICILLIN/SULBACTAM <=2 SENSITIVE Sensitive     PIP/TAZO <=4 SENSITIVE Sensitive     * >=100,000 COLONIES/mL ESCHERICHIA COLI  SARS Coronavirus 2 by RT PCR (hospital order, performed in [place] hospital lab) *cepheid single result test* Anterior Nasal Swab     Status: None   Collection Time: 10/03/21  5:55 PM   Specimen: Anterior Nasal Swab  Result Value Ref Range Status   SARS Coronavirus 2 by RT PCR NEGATIVE NEGATIVE Final    Comment: (NOTE) SARS-CoV-2 target nucleic acids are NOT DETECTED.  The SARS-CoV-2 RNA is generally detectable in upper and lower respiratory specimens during the acute phase of infection. The lowest concentration of SARS-CoV-2 viral copies this assay can detect is 250 copies / mL. A negative result does not preclude SARS-CoV-2 infection and should not be used as the sole basis for treatment or other patient  management decisions.  A negative result may occur with improper specimen collection / handling, submission of specimen other than nasopharyngeal swab, presence of viral mutation(s) within the areas targeted by this assay, and inadequate number of viral copies (<250 copies / mL). A negative result must be combined with clinical observations, patient history, and epidemiological information.  Fact Sheet for Patients:   https://www.patel.info/  Fact Sheet for Healthcare Providers: https://hall.com/  This test is not yet approved or  cleared by the [place] FDA and has been authorized for detection and/or diagnosis of SARS-CoV-2 by FDA under an Emergency Use Authorization (EUA).  This EUA will remain in effect (meaning this test can be used) for the duration of the COVID-19 declaration under Section 564(b)(1) of the Act, 21 U.S.C. section 360bbb-3(b)(1), unless the authorization is terminated or revoked sooner.  Performed at [facility], [place] [address], [place], [place] [address]   Urine Culture     Status: None   Collection Time: 10/08/21  2:13 PM   Specimen: Urine, Clean Catch  Result Value Ref Range Status   Specimen Description   Final    URINE, CLEAN CATCH Performed at [facility], [place] [address], [place], [place] [address]    Special  Requests   Final    NONE Performed at [facility], [place] [address], [place], [place] [address]    Culture   Final    NO GROWTH Performed at [facility], [place] [address], [place], [place] [address]    Report Status 10/09/2021 FINAL  Final    Time coordinating discharge: 35 minutes  Signed: Peyten Weare  [facility] 10/12/2021, 1:29 PM

## 2021-10-13 ENCOUNTER — Emergency Department (HOSPITAL_COMMUNITY): Payer: Medicare Other

## 2021-10-13 ENCOUNTER — Encounter (HOSPITAL_COMMUNITY): Payer: Self-pay | Admitting: Emergency Medicine

## 2021-10-13 ENCOUNTER — Other Ambulatory Visit: Payer: Self-pay

## 2021-10-13 ENCOUNTER — Other Ambulatory Visit (HOSPITAL_COMMUNITY): Payer: Self-pay

## 2021-10-13 ENCOUNTER — Emergency Department (HOSPITAL_COMMUNITY)
Admission: EM | Admit: 2021-10-13 | Discharge: 2021-10-13 | Disposition: A | Discharge status: Home / Self Care | Payer: Medicare Other | Attending: Emergency Medicine | Admitting: Emergency Medicine

## 2021-10-13 DIAGNOSIS — J90 Pleural effusion, not elsewhere classified: Secondary | ICD-10-CM | POA: Diagnosis not present

## 2021-10-13 DIAGNOSIS — R531 Weakness: Secondary | ICD-10-CM | POA: Insufficient documentation

## 2021-10-13 DIAGNOSIS — I10 Essential (primary) hypertension: Secondary | ICD-10-CM | POA: Insufficient documentation

## 2021-10-13 DIAGNOSIS — R0602 Shortness of breath: Secondary | ICD-10-CM | POA: Insufficient documentation

## 2021-10-13 DIAGNOSIS — Z950 Presence of cardiac pacemaker: Secondary | ICD-10-CM | POA: Diagnosis not present

## 2021-10-13 DIAGNOSIS — R609 Edema, unspecified: Secondary | ICD-10-CM | POA: Diagnosis not present

## 2021-10-13 DIAGNOSIS — R4182 Altered mental status, unspecified: Secondary | ICD-10-CM | POA: Diagnosis not present

## 2021-10-13 DIAGNOSIS — Z79899 Other long term (current) drug therapy: Secondary | ICD-10-CM | POA: Insufficient documentation

## 2021-10-13 DIAGNOSIS — R918 Other nonspecific abnormal finding of lung field: Secondary | ICD-10-CM | POA: Diagnosis not present

## 2021-10-13 DIAGNOSIS — J9811 Atelectasis: Secondary | ICD-10-CM | POA: Diagnosis not present

## 2021-10-13 DIAGNOSIS — M2550 Pain in unspecified joint: Secondary | ICD-10-CM | POA: Diagnosis not present

## 2021-10-13 DIAGNOSIS — Z7401 Bed confinement status: Secondary | ICD-10-CM | POA: Diagnosis not present

## 2021-10-13 DIAGNOSIS — J189 Pneumonia, unspecified organism: Secondary | ICD-10-CM | POA: Diagnosis not present

## 2021-10-13 LAB — COMPREHENSIVE METABOLIC PANEL
ALT: 11 U/L (ref 0–44)
AST: 19 U/L (ref 15–41)
Albumin: 3.5 g/dL (ref 3.5–5.0)
Alkaline Phosphatase: 40 U/L (ref 38–126)
Anion gap: 6 (ref 5–15)
BUN: 21 mg/dL (ref 8–23)
CO2: 37 mmol/L — ABNORMAL HIGH (ref 22–32)
Calcium: 9.1 mg/dL (ref 8.9–10.3)
Chloride: 91 mmol/L — ABNORMAL LOW (ref 98–111)
Creatinine, Ser: 1.07 mg/dL — ABNORMAL HIGH (ref 0.44–1.00)
GFR, Estimated: 49 mL/min — ABNORMAL LOW (ref >60)
Glucose, Bld: 117 mg/dL — ABNORMAL HIGH (ref 70–99)
Potassium: 4.8 mmol/L (ref 3.5–5.1)
Sodium: 134 mmol/L — ABNORMAL LOW (ref 135–145)
Total Bilirubin: 0.5 mg/dL (ref 0.3–1.2)
Total Protein: 6.9 g/dL (ref 6.5–8.1)

## 2021-10-13 LAB — CBC
HCT: 34.1 % — ABNORMAL LOW (ref 36.0–46.0)
Hemoglobin: 10.8 g/dL — ABNORMAL LOW (ref 12.0–15.0)
MCH: 32.2 pg (ref 26.0–34.0)
MCHC: 31.7 g/dL (ref 30.0–36.0)
MCV: 101.8 fL — ABNORMAL HIGH (ref 80.0–100.0)
Platelets: 138 10*3/uL — ABNORMAL LOW (ref 150–400)
RBC: 3.35 MIL/uL — ABNORMAL LOW (ref 3.87–5.11)
RDW: 14.1 % (ref 11.5–15.5)
WBC: 5.7 10*3/uL (ref 4.0–10.5)
nRBC: 0 % (ref 0.0–0.2)

## 2021-10-13 LAB — BLOOD GAS, VENOUS
Acid-Base Excess: 15.5 mmol/L — ABNORMAL HIGH (ref 0.0–2.0)
Bicarbonate: 45.9 mmol/L — ABNORMAL HIGH (ref 20.0–28.0)
O2 Saturation: 61 %
Patient temperature: 37
pCO2, Ven: 87 mmHg (ref 44–60)
pH, Ven: 7.33 (ref 7.25–7.43)
pO2, Ven: 38 mmHg (ref 32–45)

## 2021-10-13 LAB — MAGNESIUM: Magnesium: 2.1 mg/dL (ref 1.7–2.4)

## 2021-10-13 LAB — BRAIN NATRIURETIC PEPTIDE: B Natriuretic Peptide: 253.7 pg/mL — ABNORMAL HIGH (ref 0.0–100.0)

## 2021-10-13 MED ORDER — OXYCODONE HCL 5 MG PO TABS
15.0000 mg | ORAL_TABLET | Freq: Four times a day (QID) | ORAL | Status: DC | PRN
  Administered 2021-10-13: 15 mg via ORAL
  Filled 2021-10-13: qty 3

## 2021-10-13 NOTE — ED Provider Notes (Signed)
[facility] DEPT Provider Note   CSN: [id number] Arrival date & time: 10/13/21  1013     History  Chief Complaint  Patient presents with   Weakness    [name] is a 88 y.o. female.  HPI Patient presents via EMS with no discernible concerns on her part, but were reportedly with family concerns about weakness.  The patient denies focal pain, states that since arriving home yesterday after an admission she has been about the same.  She acknowledges multiple medical issues including chronic pain.  He has a history of heart failure, pacemaker placement, a flutter, was recently admitted for pneumonia, discharged, and readmitted 2 days later, no discharged yesterday.  Patient wears oxygen at baseline.  Per EMS no hemodynamic instability otherwise.    Home Medications Prior to Admission medications   Medication Sig Start Date End Date Taking? Authorizing Provider  carvedilol (COREG) 12.5 MG tablet Take 1 tablet (12.5 mg total) by mouth in the morning and at bedtime. 06/24/21   Croitoru, Mihai, MD  Cholecalciferol (VITAMIN D3 PO) Take 2 tablets by mouth daily.    [provider]  docusate sodium (COLACE) 100 MG capsule Take 1 capsule (100 mg total) by mouth every 12 (twelve) hours. 01/12/21   Sherwood Gambler, MD  dorzolamide-timolol (COSOPT) 22.3-6.8 MG/ML ophthalmic solution Place 1 drop into both eyes 2 (two) times daily. 02/18/20   Shelly Coss, MD  DULERA 100-5 MCG/ACT AERO Inhale 2 puffs into the lungs every 6 (six) hours as needed for wheezing or shortness of breath.  08/29/19   [provider]  DULoxetine (CYMBALTA) 60 MG capsule Take 60 mg by mouth daily.  08/25/15   [provider]  feeding supplement (ENSURE ENLIVE / ENSURE PLUS) LIQD Take 237 mLs by mouth 2 times daily between meals. 10/12/21   Dahal, Marlowe Aschoff, MD  furosemide (LASIX) 40 MG tablet TAKE 1 TABLET BY MOUTH EVERY DAY Patient taking differently: Take 40 mg by mouth  daily. 08/27/20   Croitoru, Mihai, MD  gabapentin (NEURONTIN) 300 MG capsule Take 300 mg by mouth 3 (three) times daily as needed (For pain).    [provider]  hydrocortisone (ANUSOL-HC) 25 MG suppository Insert 1 suppository rectally 2 times daily as needed for hemorrhoids or anal itching. 10/12/21   Dahal, Marlowe Aschoff, MD  ipratropium-albuterol (DUONEB) 0.5-2.5 (3) MG/3ML SOLN Inhale 1 vial via nebulizer every 6 hours as needed. 10/12/21 01/10/22  Dahal, Marlowe Aschoff, MD  latanoprost (XALATAN) 0.005 % ophthalmic solution Place 1 drop into both eyes at bedtime. 02/18/20   Shelly Coss, MD  oxyCODONE (ROXICODONE) 15 MG immediate release tablet Take 1 tablet (15 mg total) by mouth every 6 (six) hours as needed for pain. Patient taking differently: Take 15 mg by mouth in the morning, at noon, in the evening, and at bedtime. Takes at 0300 0900 1500 2100 02/17/20   Shelly Coss, MD  polyethylene glycol (MIRALAX / GLYCOLAX) 17 g packet Take 17 g by mouth daily. 01/12/21   Sherwood Gambler, MD  Prenatal Vit-Fe Fumarate-FA (PRENATAL VITAMINS PO) Take 1 tablet by mouth daily.    [provider]  SYNTHROID 112 MCG tablet Take 112 mcg by mouth daily before breakfast.  09/12/15   [provider]  traZODone (DESYREL) 50 MG tablet Take 50 mg by mouth at bedtime as needed for sleep.  01/10/20   [provider]  Zinc Oxide 10 % OINT Apply 1 application topically 4 (four) times daily as needed (bed sores).  [provider]      Allergies    Digitalis and Losartan    Review of Systems   Review of Systems  All other systems reviewed and are negative.   Physical Exam Updated Vital Signs BP (!) 152/80   Pulse (!) 52   Temp 98.3 F (36.8 C) (Oral)   Resp (!) 25   SpO2 95%  Physical Exam Vitals and nursing note reviewed.  Constitutional:      General: She is not in acute distress.    Appearance: She is well-developed.  HENT:     Head: Normocephalic and atraumatic.   Eyes:     Conjunctiva/sclera: Conjunctivae normal.  Cardiovascular:     Rate and Rhythm: Normal rate and regular rhythm.  Pulmonary:     Effort: Pulmonary effort is normal. No respiratory distress.     Breath sounds: Normal breath sounds. No stridor.     Comments: 2 L nasal cannula Abdominal:     General: There is no distension.  Skin:    General: Skin is warm and dry.  Neurological:     Mental Status: She is alert and oriented to person, place, and time.     Cranial Nerves: No cranial nerve deficit.     Motor: Atrophy present.  Psychiatric:        Behavior: Behavior is slowed.     ED Results / Procedures / Treatments   Labs (all labs ordered are listed, but only abnormal results are displayed) Labs Reviewed  COMPREHENSIVE METABOLIC PANEL - Abnormal; Notable for the following components:      Result Value   Sodium 134 (*)    Chloride 91 (*)    CO2 37 (*)    Glucose, Bld 117 (*)    Creatinine, Ser 1.07 (*)    GFR, Estimated 49 (*)    All other components within normal limits  CBC - Abnormal; Notable for the following components:   RBC 3.35 (*)    Hemoglobin 10.8 (*)    HCT 34.1 (*)    MCV 101.8 (*)    Platelets 138 (*)    All other components within normal limits  BLOOD GAS, VENOUS - Abnormal; Notable for the following components:   pCO2, Ven 87 (*)    Bicarbonate 45.9 (*)    Acid-Base Excess 15.5 (*)    All other components within normal limits  BRAIN NATRIURETIC PEPTIDE - Abnormal; Notable for the following components:   B Natriuretic Peptide 253.7 (*)    All other components within normal limits  MAGNESIUM  URINALYSIS, ROUTINE W REFLEX MICROSCOPIC  CBG MONITORING, ED    EKG EKG Interpretation  Date/Time:  Tuesday October 13 2021 10:47:11 EDT Ventricular Rate:  67 PR Interval:  22 QRS Duration: 113 QT Interval:  524 QTC Calculation: 554 R Axis:   15 Text Interpretation: Atrial-sensed ventricular-paced rhythm No further analysis attempted due to paced  rhythm Abnormal ECG Confirmed by Carmin Muskrat [phone]) on 10/13/2021 12:22:36 PM  Radiology CT Chest Wo Contrast  Result Date: 10/13/2021 CLINICAL DATA:  Pneumonia, complication suspected, xray done EXAM: CT CHEST WITHOUT CONTRAST TECHNIQUE: Multidetector CT imaging of the chest was performed following the standard protocol without IV contrast. RADIATION DOSE REDUCTION: This exam was performed according to the departmental dose-optimization program which includes automated exposure control, adjustment of the mA and/or kV according to patient size and/or use of iterative reconstruction technique. COMPARISON:  10/08/2021 FINDINGS: Cardiovascular: Heart is normal size. Left AICD remains in place with leads  in the right atrium, right ventricle and coronary sinus. Densely calcified mitral valve annulus. Calcified, tortuous aorta. No aneurysm. Mediastinum/Nodes: No mediastinal, hilar, or axillary adenopathy. Trachea and esophagus are unremarkable. Thyroid unremarkable. Lungs/Pleura: Small to moderate left pleural effusion, stable since prior study. Airspace disease in the left lower lobe and lingula could reflect atelectasis or scarring. Trace right pleural effusion, slightly decreased since prior study. Dependent atelectasis in the right lower lobe. Tree-in-bud nodular densities seen in the right upper lobe and right lower lobe are unchanged since prior study. No new areas of consolidation. Upper Abdomen: No acute findings Musculoskeletal: Chest wall soft tissues are unremarkable. No acute bony abnormality. Chronic T12 compression fracture. IMPRESSION: Stable small to moderate left pleural effusion. Airspace disease in the left lower lobe and lingula could reflect compressive atelectasis or pneumonia. Tree-in-bud nodular densities in the right upper lobe and right lower lobe, likely infectious/inflammatory. These are unchanged. Trace right pleural effusion, slightly decreased since prior study. Aortic  Atherosclerosis (ICD10-I70.0). Electronically Signed   By: Rolm Baptise M.D.   On: 10/13/2021 13:54   CT Head Wo Contrast  Result Date: 10/13/2021 CLINICAL DATA:  Mental status change, unknown cause EXAM: CT HEAD WITHOUT CONTRAST TECHNIQUE: Contiguous axial images were obtained from the base of the skull through the vertex without intravenous contrast. RADIATION DOSE REDUCTION: This exam was performed according to the departmental dose-optimization program which includes automated exposure control, adjustment of the mA and/or kV according to patient size and/or use of iterative reconstruction technique. COMPARISON:  10/03/2021 FINDINGS: Brain: Probable right joint cyst in the right middle cranial fossa is stable. There is atrophy and chronic small vessel disease changes. No acute intracranial abnormality. Specifically, no hemorrhage, hydrocephalus, acute infarction, or significant intracranial injury. Vascular: No hyperdense vessel or unexpected calcification. Skull: No acute calvarial abnormality. Sinuses/Orbits: No acute findings Other: None IMPRESSION: Atrophy, chronic microvascular disease. No acute intracranial abnormality. Electronically Signed   By: Rolm Baptise M.D.   On: 10/13/2021 13:50   DG Chest 2 View  Result Date: 10/13/2021 CLINICAL DATA:  Altered mental status.  Weakness.  Recent pneumonia. EXAM: CHEST - 2 VIEW COMPARISON:  10/08/2021 FINDINGS: Aortic atherosclerosis as seen previously. Pacemaker AICD is seen previously. Similar pattern of mild atelectasis at the right lung base and left effusion with left lower lobe volume loss/pneumonia. No new finding allowing for positioning differences. IMPRESSION: No prevertebral change since the study of 07/13. Persistent left effusion with left base atelectasis/pneumonia. Mild patchy density at the right lung base. Electronically Signed   By: Nelson Chimes M.D.   On: 10/13/2021 11:07    Procedures Procedures    Medications Ordered in  ED Medications  oxyCODONE (Oxy IR/ROXICODONE) immediate release tablet 15 mg (15 mg Oral Given 10/13/21 1319)    ED Course/ Medical Decision Making/ A&P This patient with a Hx of multiple medical issues including pacemaker, dementia, ovarian cancer, hypertension, heart failure recent admission for pneumonia presents to the ED for concern of possible weakness, this involves an extensive number of treatment options, and is a complaint that carries with it a high risk of complications and morbidity.    The differential diagnosis includes recurrent pneumonia, bacteremia, sepsis, heart failure, less likely ACS   Social Determinants of Health:  Advanced age, dementia  Additional history obtained:  Additional history and/or information obtained from chart review, EMS, notable for EMS details included in HPI, chart review including brief summary of her last hospitalization was included below:  [name] is a  88 y.o. female with PMH significant for dementia, stage IV ovarian cancer, HTN, chronic diastolic CHF, nonischemic cardiomyopathy status post biventricular ICD, COPD, on 2 L oxygen at home, hypothyroidism, peripheral neuropathy. Recently hospitalized 7/8 to 7/11 for intractable nausea vomiting due to E. coli UTI, profound hypothyroidism, pneumonia and discharged to home on oral Augmentin Patient presented to the ED from home on 7/13 with complaint of nausea, worsening weakness since discharge from the hospital.   In the ED, patient was afebrile, blood pressure elevated to 190, O2 sat low in 80s on 2 L and had to be increased to 4 L. Labs without leukocytosis, mild anemia with hemoglobin of 11.8, mild thrombocytopenia of 118, lactate normal, BMP unremarkable TSH had improved from a superhigh level of 80 previously to 56 with Synthroid Urinalysis unremarkable CTA chest/abdomen/pelvis was negative for pulmonary embolism but showed moderate left pleural effusion that has enlarged since prior  study on 7/8.  Admitted to hospitalist service   Subjective: Patient was seen and examined this morning.  Elderly Caucasian female.  Propped up in bed.  More awake today.  Able to have a conversation.  Family not at bedside.    Hospital course: Left pleural effusion Recent right sided pneumonia Acute on chronic respiratory failure with hypoxia -CT chest on admission showed moderate left sided pleural effusion increased in size compared to previous imaging.  Recent pneumonia was on right upper lobe and hence it is unlikely to be parapneumonic effusion.  Patient completed the course of Augmentin today 7/16 as recommended in last admission. -Based on the presence of enlarging left pleural effusion as reported on CT chest, ultrasound thoracentesis was ordered but it noted that the pleural effusion was small in size and hence patient did not need thoracentesis. -Chronically on 2 L oxygen at home.  Initially required higher oxygen level at 4 L/min.  Down to baseline requirement now. Last Labs        Recent Labs  Lab 10/08/21 1239 10/11/21 0517 10/12/21 0527  WBC 5.2 7.4 7.8      Bronchiectasis -CT abdomen from previous admission on 7/8 showed bronchiectasis with airway impaction in the inferior right middle and lower lobes -Encourage chest physical therapy, flutter valve, Mucinex.  Nebulizer and DuoNeb ordered at patient's request.   Chronic diastolic CHF Nonischemic cardiomyopathy s/p BiV pacemaker Essential hypertension -Initial shortness of breath without evidence of volume overload.  BNP slightly elevated to 123 -Echocardiogram 7/16 with EF more than 75%, no regional wall motion abnormalities, severe concentric LVH, severe MAC -Patient was given IV Lasix 40 mg daily for last 2 days.  Creatinine rose up to 1.40 yesterday 7/16.  Patient was given IV fluid.  Creatinine improved close to normal today. -Continue Lasix oral 40 mg daily at home.   AKI -Creatinine normal at baseline.   Elevated 1.4 today with diuresis.  Diuretics was held.  IV fluid was given.  Creatinine improved.   Recent Labs (within last 365 days)           Recent Labs    01/12/21 1727 10/03/21 1238 10/05/21 0350 10/08/21 1239 10/09/21 0500 10/11/21 0517 10/12/21 0527  BUN 38* 21 24* 19 15 26* 22  CREATININE 0.93 0.81 1.21* 0.94 0.98 1.40* 1.10*      Pulmonary nodules -CT scan 7/8 showed 8 mm and 5 mm sized pulm nodules in the right lower lobes.  Recommend repeat CT chest in 3 to 6 months.   Chronic pain syndrome -Continue home oxycodone, gabapentin  Constipation -Bowel regimen   Profound hypothyroidism -Previously with TSH of 80 on recent admission due to missing her Synthroid for 3 weeks and was resumed on discharge.  TSH is improving now at 56.  Continue Synthroid and recommend follow-up outpatient in about 4 weeks.    After the initial evaluation, orders, including: Labs x-ray were initiated.   Patient placed on Cardiac and Pulse-Oximetry Monitors. The patient was maintained on a cardiac monitor.  The cardiac monitored showed an rhythm of  paced rhythm 70 unremarkable otherwise The patient was also maintained on pulse oximetry. The readings were typically 99% 2 L nasal cannula, baseline, but abnormal    Update: Patient accompanied by her daughter.  Daughter notes that today, earlier in the day patient had an episode of more noticeable weakness than she had experienced since discharge yesterday.  Daughter notes the patient is currently much closer to baseline, interacting appropriately.  Patient currently denies any complaints.  Reviewed results thus far.  With abnormal x-ray, CT chest ordered   On repeat evaluation of the patient improved 3:25 PM Patient awake and alert, sitting upright, eating, no distress Lab Tests:  I personally interpreted labs.  The pertinent results include: Generally reassuring aside from slight increase in BNP.  Her renal function remains a  stable  Imaging Studies ordered:  I independently visualized and interpreted imaging which showed left-sided pleural effusion I agree with the radiologist interpretation   Dispostion / Final MDM:  After consideration of the diagnostic results and the patient's response to treatment, this adult female with recent hospitalization and return visit following initial presentation with dyspnea, findings for pneumonia, worsening heart failure and renal function now presents after episode of weakness earlier today, the day after discharge.  By the time of arrival the patient is neurologically unremarkable, aside from age-appropriate atrophy.  Patient has no new oxygen requirement, has no evidence for new pneumonia, renal function is stable, she is awake, alert, monitored for hours without decompensation, has CT head without obvious abnormality.  Though she may have experienced a TIA earlier in the day, she is not a candidate for MRI, and no current evidence for stroke. Daughter, patient comfortable with discharge with primary care follow-up.  Notably, with increased BNP she will resume her prior dosing of Lasix on discharge.  Final Clinical Impression(s) / ED Diagnoses Final diagnoses:  Weakness  Shortness of breath    Rx / DC Orders ED Discharge Orders     None         Carmin Muskrat, MD 10/13/21 1525

## 2021-10-13 NOTE — Discharge Instructions (Addendum)
As discussed, your evaluation today has been largely reassuring.  But, it is important that you monitor your condition carefully, and do not hesitate to return to the ED if you develop new, or concerning changes in your condition.  Otherwise, please follow-up with your physician for appropriate ongoing care.  Please resume your Lasix dosing that you had previously been adhering to.  Discuss dosing of this medication with your physician on follow-up.

## 2021-10-13 NOTE — ED Triage Notes (Signed)
BIBA Per EMS: Pt coming from home w/ c/o weakness since being in hospital for pneumonia.  66 HR  100%  2L at baseline  150/86 18 RR 123 CBG

## 2021-10-13 NOTE — ED Notes (Signed)
Contacted [facility] for pt transport to home.  Advised it will be about an hour.

## 2021-10-13 NOTE — ED Provider Triage Note (Signed)
Emergency Medicine Provider Triage Evaluation Note  [name] , a 88 y.o. female  was evaluated in triage.  Pt complains of generalized weakness.  Patient states her daughter told her that she probably needed to be seen.  She states "I am just not feeling good ".  Very limited history obtained.  Review of Systems  Positive: Weakness Negative: Fever, abdominal pain  Physical Exam  BP (!) 170/75   Pulse 63   Temp 98.9 F (37.2 C) (Oral)   Resp 16   SpO2 100%  Gen:   Awake, no distress   Resp:  Normal effort  MSK:   Moves extremities without difficulty  Other:    Medical Decision Making  Medically screening exam initiated at 10:33 AM.  Appropriate orders placed.  [name] was informed that the remainder of the evaluation will be completed by another provider, this initial triage assessment does not replace that evaluation, and the importance of remaining in the ED until their evaluation is complete.     Janeece Fitting, PA-C 10/13/21 1035

## 2021-10-16 ENCOUNTER — Observation Stay (HOSPITAL_COMMUNITY): Payer: Medicare Other

## 2021-10-16 ENCOUNTER — Other Ambulatory Visit: Payer: Self-pay

## 2021-10-16 ENCOUNTER — Observation Stay (HOSPITAL_COMMUNITY)
Admission: EM | Admit: 2021-10-16 | Discharge: 2021-10-17 | Disposition: A | Discharge status: Home Health | Payer: Medicare Other | Attending: Student | Admitting: Student

## 2021-10-16 ENCOUNTER — Encounter (HOSPITAL_COMMUNITY): Payer: Self-pay

## 2021-10-16 ENCOUNTER — Other Ambulatory Visit (HOSPITAL_COMMUNITY): Payer: Self-pay

## 2021-10-16 ENCOUNTER — Emergency Department (HOSPITAL_COMMUNITY): Payer: Medicare Other

## 2021-10-16 DIAGNOSIS — R627 Adult failure to thrive: Secondary | ICD-10-CM | POA: Diagnosis not present

## 2021-10-16 DIAGNOSIS — R911 Solitary pulmonary nodule: Secondary | ICD-10-CM | POA: Diagnosis not present

## 2021-10-16 DIAGNOSIS — J449 Chronic obstructive pulmonary disease, unspecified: Secondary | ICD-10-CM | POA: Diagnosis present

## 2021-10-16 DIAGNOSIS — J9611 Chronic respiratory failure with hypoxia: Secondary | ICD-10-CM | POA: Insufficient documentation

## 2021-10-16 DIAGNOSIS — E039 Hypothyroidism, unspecified: Secondary | ICD-10-CM | POA: Diagnosis present

## 2021-10-16 DIAGNOSIS — Z20822 Contact with and (suspected) exposure to covid-19: Secondary | ICD-10-CM | POA: Insufficient documentation

## 2021-10-16 DIAGNOSIS — E785 Hyperlipidemia, unspecified: Secondary | ICD-10-CM | POA: Diagnosis present

## 2021-10-16 DIAGNOSIS — Z8543 Personal history of malignant neoplasm of ovary: Secondary | ICD-10-CM | POA: Diagnosis not present

## 2021-10-16 DIAGNOSIS — J189 Pneumonia, unspecified organism: Secondary | ICD-10-CM

## 2021-10-16 DIAGNOSIS — R0902 Hypoxemia: Secondary | ICD-10-CM | POA: Diagnosis not present

## 2021-10-16 DIAGNOSIS — Z95810 Presence of automatic (implantable) cardiac defibrillator: Secondary | ICD-10-CM | POA: Diagnosis not present

## 2021-10-16 DIAGNOSIS — I110 Hypertensive heart disease with heart failure: Secondary | ICD-10-CM | POA: Insufficient documentation

## 2021-10-16 DIAGNOSIS — G894 Chronic pain syndrome: Secondary | ICD-10-CM | POA: Diagnosis present

## 2021-10-16 DIAGNOSIS — R410 Disorientation, unspecified: Secondary | ICD-10-CM | POA: Diagnosis not present

## 2021-10-16 DIAGNOSIS — R531 Weakness: Secondary | ICD-10-CM | POA: Diagnosis not present

## 2021-10-16 DIAGNOSIS — J90 Pleural effusion, not elsewhere classified: Secondary | ICD-10-CM | POA: Insufficient documentation

## 2021-10-16 DIAGNOSIS — G629 Polyneuropathy, unspecified: Secondary | ICD-10-CM

## 2021-10-16 DIAGNOSIS — R4182 Altered mental status, unspecified: Secondary | ICD-10-CM | POA: Diagnosis not present

## 2021-10-16 DIAGNOSIS — I10 Essential (primary) hypertension: Secondary | ICD-10-CM | POA: Diagnosis present

## 2021-10-16 DIAGNOSIS — T50905A Adverse effect of unspecified drugs, medicaments and biological substances, initial encounter: Secondary | ICD-10-CM | POA: Diagnosis not present

## 2021-10-16 DIAGNOSIS — Z79899 Other long term (current) drug therapy: Secondary | ICD-10-CM | POA: Insufficient documentation

## 2021-10-16 DIAGNOSIS — I5033 Acute on chronic diastolic (congestive) heart failure: Secondary | ICD-10-CM | POA: Diagnosis not present

## 2021-10-16 DIAGNOSIS — G929 Unspecified toxic encephalopathy: Secondary | ICD-10-CM | POA: Insufficient documentation

## 2021-10-16 DIAGNOSIS — J811 Chronic pulmonary edema: Secondary | ICD-10-CM | POA: Diagnosis not present

## 2021-10-16 DIAGNOSIS — G9340 Encephalopathy, unspecified: Secondary | ICD-10-CM | POA: Diagnosis present

## 2021-10-16 DIAGNOSIS — R918 Other nonspecific abnormal finding of lung field: Secondary | ICD-10-CM

## 2021-10-16 LAB — BLOOD GAS, VENOUS
Acid-Base Excess: 20.6 mmol/L — ABNORMAL HIGH (ref 0.0–2.0)
Bicarbonate: 50.2 mmol/L — ABNORMAL HIGH (ref 20.0–28.0)
O2 Saturation: 59.9 %
Patient temperature: 37.5
pCO2, Ven: 83 mmHg (ref 44–60)
pH, Ven: 7.39 (ref 7.25–7.43)
pO2, Ven: 37 mmHg (ref 32–45)

## 2021-10-16 LAB — URINALYSIS, ROUTINE W REFLEX MICROSCOPIC
Bacteria, UA: NONE SEEN
Bilirubin Urine: NEGATIVE
Glucose, UA: NEGATIVE mg/dL
Hgb urine dipstick: NEGATIVE
Ketones, ur: NEGATIVE mg/dL
Nitrite: NEGATIVE
Protein, ur: NEGATIVE mg/dL
Specific Gravity, Urine: 1.012 (ref 1.005–1.030)
pH: 6 (ref 5.0–8.0)

## 2021-10-16 LAB — COMPREHENSIVE METABOLIC PANEL
ALT: 10 U/L (ref 0–44)
AST: 21 U/L (ref 15–41)
Albumin: 3.5 g/dL (ref 3.5–5.0)
Alkaline Phosphatase: 38 U/L (ref 38–126)
Anion gap: 7 (ref 5–15)
BUN: 34 mg/dL — ABNORMAL HIGH (ref 8–23)
CO2: 42 mmol/L — ABNORMAL HIGH (ref 22–32)
Calcium: 9.1 mg/dL (ref 8.9–10.3)
Chloride: 89 mmol/L — ABNORMAL LOW (ref 98–111)
Creatinine, Ser: 1.09 mg/dL — ABNORMAL HIGH (ref 0.44–1.00)
GFR, Estimated: 48 mL/min — ABNORMAL LOW (ref >60)
Glucose, Bld: 110 mg/dL — ABNORMAL HIGH (ref 70–99)
Potassium: 4.8 mmol/L (ref 3.5–5.1)
Sodium: 138 mmol/L (ref 135–145)
Total Bilirubin: 0.5 mg/dL (ref 0.3–1.2)
Total Protein: 6.8 g/dL (ref 6.5–8.1)

## 2021-10-16 LAB — CBC WITH DIFFERENTIAL/PLATELET
Abs Immature Granulocytes: 0.02 10*3/uL (ref 0.00–0.07)
Basophils Absolute: 0.1 10*3/uL (ref 0.0–0.1)
Basophils Relative: 1 %
Eosinophils Absolute: 0.2 10*3/uL (ref 0.0–0.5)
Eosinophils Relative: 4 %
HCT: 33.1 % — ABNORMAL LOW (ref 36.0–46.0)
Hemoglobin: 10.4 g/dL — ABNORMAL LOW (ref 12.0–15.0)
Immature Granulocytes: 0 %
Lymphocytes Relative: 18 %
Lymphs Abs: 0.9 10*3/uL (ref 0.7–4.0)
MCH: 32.2 pg (ref 26.0–34.0)
MCHC: 31.4 g/dL (ref 30.0–36.0)
MCV: 102.5 fL — ABNORMAL HIGH (ref 80.0–100.0)
Monocytes Absolute: 0.6 10*3/uL (ref 0.1–1.0)
Monocytes Relative: 12 %
Neutro Abs: 3.3 10*3/uL (ref 1.7–7.7)
Neutrophils Relative %: 65 %
Platelets: 156 10*3/uL (ref 150–400)
RBC: 3.23 MIL/uL — ABNORMAL LOW (ref 3.87–5.11)
RDW: 14.1 % (ref 11.5–15.5)
WBC: 5 10*3/uL (ref 4.0–10.5)
nRBC: 0 % (ref 0.0–0.2)

## 2021-10-16 LAB — BRAIN NATRIURETIC PEPTIDE: B Natriuretic Peptide: 326.1 pg/mL — ABNORMAL HIGH (ref 0.0–100.0)

## 2021-10-16 LAB — PROCALCITONIN: Procalcitonin: <0.1 ng/mL

## 2021-10-16 LAB — TSH: TSH: 26.479 u[IU]/mL — ABNORMAL HIGH (ref 0.350–4.500)

## 2021-10-16 LAB — MAGNESIUM: Magnesium: 2 mg/dL (ref 1.7–2.4)

## 2021-10-16 LAB — T4, FREE: Free T4: 0.57 ng/dL — ABNORMAL LOW (ref 0.61–1.12)

## 2021-10-16 LAB — SARS CORONAVIRUS 2 BY RT PCR: SARS Coronavirus 2 by RT PCR: NEGATIVE

## 2021-10-16 LAB — TROPONIN I (HIGH SENSITIVITY)
Troponin I (High Sensitivity): 15 ng/L (ref <18)
Troponin I (High Sensitivity): 15 ng/L (ref <18)

## 2021-10-16 LAB — AMMONIA: Ammonia: 17 umol/L (ref 9–35)

## 2021-10-16 LAB — VITAMIN B12: Vitamin B-12: 218 pg/mL (ref 180–914)

## 2021-10-16 MED ORDER — SODIUM CHLORIDE 0.9 % IV SOLN
1.0000 g | Freq: Once | INTRAVENOUS | Status: AC
  Administered 2021-10-16: 1 g via INTRAVENOUS
  Filled 2021-10-16: qty 10

## 2021-10-16 MED ORDER — SODIUM CHLORIDE 0.9 % IV SOLN
500.0000 mg | INTRAVENOUS | Status: DC
  Filled 2021-10-16: qty 5

## 2021-10-16 MED ORDER — GUAIFENESIN ER 600 MG PO TB12
600.0000 mg | ORAL_TABLET | Freq: Two times a day (BID) | ORAL | Status: DC
Start: 2021-10-16 — End: 2021-10-17
  Administered 2021-10-16 – 2021-10-17 (×2): 600 mg via ORAL
  Filled 2021-10-16 (×2): qty 1

## 2021-10-16 MED ORDER — SODIUM CHLORIDE 0.9% FLUSH
3.0000 mL | Freq: Two times a day (BID) | INTRAVENOUS | Status: DC
Start: 2021-10-16 — End: 2021-10-17
  Administered 2021-10-16 – 2021-10-17 (×2): 3 mL via INTRAVENOUS

## 2021-10-16 MED ORDER — CEFTRIAXONE SODIUM 1 G IJ SOLR
1.0000 g | INTRAMUSCULAR | Status: DC
Start: 2021-10-17 — End: 2021-10-17
  Filled 2021-10-16: qty 10

## 2021-10-16 MED ORDER — SODIUM CHLORIDE 0.9 % IV SOLN: 250.0000 mL | INTRAVENOUS | Status: DC | PRN

## 2021-10-16 MED ORDER — DULOXETINE HCL 30 MG PO CPEP
60.0000 mg | ORAL_CAPSULE | Freq: Every day | ORAL | Status: DC
  Administered 2021-10-17: 60 mg via ORAL
  Filled 2021-10-16: qty 2

## 2021-10-16 MED ORDER — DOCUSATE SODIUM 100 MG PO CAPS
100.0000 mg | ORAL_CAPSULE | Freq: Two times a day (BID) | ORAL | Status: DC
  Administered 2021-10-17: 100 mg via ORAL
  Filled 2021-10-16: qty 1

## 2021-10-16 MED ORDER — LATANOPROST 0.005 % OP SOLN: 1.0000 [drp] | Freq: Every day | OPHTHALMIC | Status: DC

## 2021-10-16 MED ORDER — ACETAMINOPHEN 325 MG PO TABS: 650.0000 mg | ORAL_TABLET | Freq: Four times a day (QID) | ORAL | Status: DC | PRN

## 2021-10-16 MED ORDER — SODIUM CHLORIDE 0.9 % IV SOLN: Freq: Once | INTRAVENOUS | Status: AC

## 2021-10-16 MED ORDER — CARVEDILOL 12.5 MG PO TABS
12.5000 mg | ORAL_TABLET | Freq: Two times a day (BID) | ORAL | Status: DC
  Administered 2021-10-17: 12.5 mg via ORAL
  Filled 2021-10-16: qty 1

## 2021-10-16 MED ORDER — ENSURE ENLIVE PO LIQD
237.0000 mL | Freq: Two times a day (BID) | ORAL | Status: DC
Start: 2021-10-17 — End: 2021-10-17
  Administered 2021-10-17: 237 mL via ORAL

## 2021-10-16 MED ORDER — OXYCODONE HCL 5 MG PO TABS
15.0000 mg | ORAL_TABLET | Freq: Four times a day (QID) | ORAL | Status: DC | PRN
  Administered 2021-10-16 – 2021-10-17 (×3): 15 mg via ORAL
  Filled 2021-10-16 (×3): qty 3

## 2021-10-16 MED ORDER — FUROSEMIDE 10 MG/ML IJ SOLN
40.0000 mg | Freq: Once | INTRAMUSCULAR | Status: AC
  Administered 2021-10-16: 40 mg via INTRAVENOUS
  Filled 2021-10-16: qty 4

## 2021-10-16 MED ORDER — OXYCODONE HCL 5 MG PO TABS: 15.0000 mg | ORAL_TABLET | Freq: Four times a day (QID) | ORAL | Status: DC | PRN

## 2021-10-16 MED ORDER — ENOXAPARIN SODIUM 30 MG/0.3ML IJ SOSY
30.0000 mg | PREFILLED_SYRINGE | INTRAMUSCULAR | Status: DC
Start: 2021-10-16 — End: 2021-10-17
  Administered 2021-10-16: 30 mg via SUBCUTANEOUS
  Filled 2021-10-16: qty 0.3

## 2021-10-16 MED ORDER — ACETAMINOPHEN 650 MG RE SUPP: 650.0000 mg | Freq: Four times a day (QID) | RECTAL | Status: DC | PRN

## 2021-10-16 MED ORDER — LEVOTHYROXINE SODIUM 112 MCG PO TABS
112.0000 ug | ORAL_TABLET | Freq: Every day | ORAL | Status: DC
  Administered 2021-10-17: 112 ug via ORAL
  Filled 2021-10-16: qty 1

## 2021-10-16 MED ORDER — TRAZODONE HCL 50 MG PO TABS: 50.0000 mg | ORAL_TABLET | Freq: Every evening | ORAL | Status: DC | PRN

## 2021-10-16 MED ORDER — SODIUM CHLORIDE 0.9 % IV SOLN
500.0000 mg | Freq: Once | INTRAVENOUS | Status: AC
  Administered 2021-10-16: 500 mg via INTRAVENOUS
  Filled 2021-10-16: qty 5

## 2021-10-16 MED ORDER — MOMETASONE FURO-FORMOTEROL FUM 100-5 MCG/ACT IN AERO: 2.0000 | INHALATION_SPRAY | Freq: Four times a day (QID) | RESPIRATORY_TRACT | Status: DC | PRN

## 2021-10-16 MED ORDER — SODIUM CHLORIDE 0.9% FLUSH: 3.0000 mL | INTRAVENOUS | Status: DC | PRN

## 2021-10-16 MED ORDER — IPRATROPIUM-ALBUTEROL 0.5-2.5 (3) MG/3ML IN SOLN: 3.0000 mL | Freq: Four times a day (QID) | RESPIRATORY_TRACT | Status: DC | PRN

## 2021-10-16 MED ORDER — DORZOLAMIDE HCL-TIMOLOL MAL 2-0.5 % OP SOLN: 1.0000 [drp] | Freq: Two times a day (BID) | OPHTHALMIC | Status: DC
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
## 2021-10-16 NOTE — Assessment & Plan Note (Signed)
Has had a 2 week decline in ability to take care of herself and feed herself and do her baseline ADLs. Now needs help to eat and has had gradual progression of poor PO intake -nutrition consult -PT/OT -daughter refuses to place her and will keep her at home -would benefit from palliative care consult, but not open to this at this time

## 2021-10-16 NOTE — Assessment & Plan Note (Addendum)
88 year old presenting with acute onset confusion, screaming, disorientation and inability to do her ADLS -obs to telemetry -check head CT, but no neuro deficits on exam  -troponin wnl x 2 -metabolic labs significant for profound hypothyroidism that could be contributing from last hospitalization, but numbers were trending down. Repeat today and other metabolic labs -possible pneumonia vs. Fluid on CXR. No wbc. Will check PCT and continue rocephin and azithromycin for now. Has left pleural effusion, but this appears stable. Mucinex, flutter valve and IS to bedside  -sputum culture, but cough appears dry  -UA not suspicious for infection, culture pending  -blood cx pending -check VBG in setting of chronic respiratory failure to check for hypercapnia -she is on quite a bit of pain medication that she takes 4x/day, but no change in dose or frequency of medication and pmp reviewed.  -daughter denies history of dementia, very resistant to palliative care consult  -will check swallow screen, but appears to be alert and can safely swallow

## 2021-10-16 NOTE — Assessment & Plan Note (Addendum)
History of diastolic CHF with increased leg swelling, shortness of breath, elevated BNP and possible increase in pleural effusion , although vascular congestion has improved and history of poor PO intake  -23m IV lasix x 1 and watch urine output -strict I/O and daily weights -recent echo 09/2021: EF>75% with hyperdynamic function. Diastolic function indeterminate due to severe MAC. Moderate aortic valve thickening, read as mild stenosis  Continue coreg BID

## 2021-10-16 NOTE — Assessment & Plan Note (Signed)
Could be contributing to overall decline, but studies improving over past  2 weeks.  Repeat TSH/free T4 to ensure trending toward normal

## 2021-10-16 NOTE — Assessment & Plan Note (Addendum)
At baseline with no increased oxygen demand No evidence of exacerbation  Checking vbg

## 2021-10-16 NOTE — Assessment & Plan Note (Signed)
-  CT scan 7/8 showed 8 mm and 5 mm sized pulm nodules in the right lower lobes.  Recommend repeat CT chest in 3 to 6 months.
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
## 2021-10-16 NOTE — Assessment & Plan Note (Addendum)
Can not feel her feet Continue gabapentin  Fall precaution

## 2021-10-16 NOTE — Assessment & Plan Note (Signed)
pmp website reviewed and takes appropriately Continue oxycodone '15mg'$  QID  Gabapentin and cymbalta

## 2021-10-16 NOTE — ED Notes (Signed)
Pt aware of the need of a urine sample. Pt unable to give urine at this time.

## 2021-10-16 NOTE — ED Provider Notes (Signed)
[facility] DEPT Provider Note   CSN: [id number] Arrival date & time: 10/16/21  1046     History {Add pertinent medical, surgical, social history, OB history to HPI:1} Chief Complaint  Patient presents with   Failure To Orrum is a 88 y.o. female.  HPI Patient daughter reports that the patient at baseline is bedbound.  She has been for a number of years but she reports that her cognitive function has been good.  Patient's daughter reports that she is been increasingly confused lately.  She has had several visits to the emergency department in the hospitalization.  Patient daughter reports that she came back from the hospital seemingly in worse condition than when she left.  Patient's daughter reports that sometimes she is not making sense when she talks and does not seem to understand her very well.  No fevers.  No vomiting.  No diarrhea.  Does not have any pain complaints.  Patient's daughter is concerned that she has residual urinary tract infection or infection somewhere causing her to be persistently     Home Medications Prior to Admission medications   Medication Sig Start Date End Date Taking? Authorizing Provider  carvedilol (COREG) 12.5 MG tablet Take 1 tablet (12.5 mg total) by mouth in the morning and at bedtime. 06/24/21   Croitoru, Mihai, MD  Cholecalciferol (VITAMIN D3 PO) Take 2 tablets by mouth daily.    [provider]  docusate sodium (COLACE) 100 MG capsule Take 1 capsule (100 mg total) by mouth every 12 (twelve) hours. 01/12/21   Sherwood Gambler, MD  dorzolamide-timolol (COSOPT) 22.3-6.8 MG/ML ophthalmic solution Place 1 drop into both eyes 2 (two) times daily. 02/18/20   Shelly Coss, MD  DULERA 100-5 MCG/ACT AERO Inhale 2 puffs into the lungs every 6 (six) hours as needed for wheezing or shortness of breath.  08/29/19   [provider]  DULoxetine (CYMBALTA) 60 MG capsule Take 60 mg by mouth daily.   08/25/15   [provider]  feeding supplement (ENSURE ENLIVE / ENSURE PLUS) LIQD Take 237 mLs by mouth 2 times daily between meals. 10/12/21   Dahal, Marlowe Aschoff, MD  furosemide (LASIX) 40 MG tablet TAKE 1 TABLET BY MOUTH EVERY DAY Patient taking differently: Take 40 mg by mouth daily. 08/27/20   Croitoru, Mihai, MD  gabapentin (NEURONTIN) 300 MG capsule Take 300 mg by mouth 3 (three) times daily as needed (For pain).    [provider]  hydrocortisone (ANUSOL-HC) 25 MG suppository Insert 1 suppository rectally 2 times daily as needed for hemorrhoids or anal itching. 10/12/21   Dahal, Marlowe Aschoff, MD  ipratropium-albuterol (DUONEB) 0.5-2.5 (3) MG/3ML SOLN Inhale 1 vial via nebulizer every 6 hours as needed. 10/12/21 01/10/22  Dahal, Marlowe Aschoff, MD  latanoprost (XALATAN) 0.005 % ophthalmic solution Place 1 drop into both eyes at bedtime. 02/18/20   Shelly Coss, MD  oxyCODONE (ROXICODONE) 15 MG immediate release tablet Take 1 tablet (15 mg total) by mouth every 6 (six) hours as needed for pain. Patient taking differently: Take 15 mg by mouth in the morning, at noon, in the evening, and at bedtime. Takes at 0300 0900 1500 2100 02/17/20   Shelly Coss, MD  polyethylene glycol (MIRALAX / GLYCOLAX) 17 g packet Take 17 g by mouth daily. 01/12/21   Sherwood Gambler, MD  Prenatal Vit-Fe Fumarate-FA (PRENATAL VITAMINS PO) Take 1 tablet by mouth daily.    [provider]  SYNTHROID 112 MCG tablet Take 112  mcg by mouth daily before breakfast.  09/12/15   [provider]  traZODone (DESYREL) 50 MG tablet Take 50 mg by mouth at bedtime as needed for sleep.  01/10/20   [provider]  Zinc Oxide 10 % OINT Apply 1 application topically 4 (four) times daily as needed (bed sores).    [provider]      Allergies    Digitalis and Losartan    Review of Systems   Review of Systems  Physical Exam Updated Vital Signs BP (!) 153/91   Pulse 71   Temp 98 F (36.7 C)  (Oral)   Resp 16   Ht '5\' 2"'$  (1.575 m)   Wt 62 kg   SpO2 98%   BMI 25.00 kg/m  Physical Exam  ED Results / Procedures / Treatments   Labs (all labs ordered are listed, but only abnormal results are displayed) Labs Reviewed  BRAIN NATRIURETIC PEPTIDE - Abnormal; Notable for the following components:      Result Value   B Natriuretic Peptide 326.1 (*)    All other components within normal limits  COMPREHENSIVE METABOLIC PANEL - Abnormal; Notable for the following components:   Chloride 89 (*)    CO2 42 (*)    Glucose, Bld 110 (*)    BUN 34 (*)    Creatinine, Ser 1.09 (*)    GFR, Estimated 48 (*)    All other components within normal limits  CBC WITH DIFFERENTIAL/PLATELET - Abnormal; Notable for the following components:   RBC 3.23 (*)    Hemoglobin 10.4 (*)    HCT 33.1 (*)    MCV 102.5 (*)    All other components within normal limits  URINALYSIS, ROUTINE W REFLEX MICROSCOPIC  TROPONIN I (HIGH SENSITIVITY)  TROPONIN I (HIGH SENSITIVITY)    EKG EKG Interpretation  Date/Time:  Friday October 16 2021 12:25:12 EDT Ventricular Rate:  70 PR Interval:  204 QRS Duration: 136 QT Interval:  435 QTC Calculation: 470 R Axis:   2 Text Interpretation: Atrial-sensed ventricular-paced rhythm No further analysis attempted due to paced rhythm Baseline wander in lead(s) V1 no change from previous Confirmed by Charlesetta Shanks [phone]) on 10/16/2021 2:28:01 PM  Radiology No results found.  Procedures Procedures  {Document cardiac monitor, telemetry assessment procedure when appropriate:1}  Medications Ordered in ED Medications  0.9 %  sodium chloride infusion (0 mLs Intravenous Stopped 10/16/21 1302)    ED Course/ Medical Decision Making/ A&P                           Medical Decision Making Amount and/or Complexity of Data Reviewed Labs: ordered. Radiology: ordered.  Risk Prescription drug management.   ***  {Document critical care time when appropriate:1} {Document  review of labs and clinical decision tools ie heart score, Chads2Vasc2 etc:1}  {Document your independent review of radiology images, and any outside records:1} {Document your discussion with family members, caretakers, and with consultants:1} {Document social determinants of health affecting pt's care:1} {Document your decision making why or why not admission, treatments were needed:1} Final Clinical Impression(s) / ED Diagnoses Final diagnoses:  None    Rx / DC Orders ED Discharge Orders     None

## 2021-10-16 NOTE — Assessment & Plan Note (Signed)
-  Based on the presence of enlarging left pleural effusion as reported on CT chest, ultrasound thoracentesis was ordered on 7/14 but it noted that the pleural effusion was small in size and hence patient did not need thoracentesis during last hospitalization -on 7/18 she had repeat chest CT: Stable small to moderate left pleural effusion. Airspace disease in the left lower lobe and lingula could reflect compressive atelectasis or pneumonia. -appears stable, not requiring any new oxygen from baseline -continue to monitor with IV lasix

## 2021-10-16 NOTE — Assessment & Plan Note (Signed)
Continue coreg 12.'5mg'$  Bid

## 2021-10-17 ENCOUNTER — Other Ambulatory Visit: Payer: Self-pay

## 2021-10-17 ENCOUNTER — Observation Stay (HOSPITAL_COMMUNITY): Payer: Medicare Other

## 2021-10-17 DIAGNOSIS — I5032 Chronic diastolic (congestive) heart failure: Secondary | ICD-10-CM

## 2021-10-17 DIAGNOSIS — E039 Hypothyroidism, unspecified: Secondary | ICD-10-CM

## 2021-10-17 DIAGNOSIS — R918 Other nonspecific abnormal finding of lung field: Secondary | ICD-10-CM | POA: Diagnosis not present

## 2021-10-17 DIAGNOSIS — R404 Transient alteration of awareness: Secondary | ICD-10-CM | POA: Diagnosis not present

## 2021-10-17 DIAGNOSIS — J9611 Chronic respiratory failure with hypoxia: Secondary | ICD-10-CM

## 2021-10-17 DIAGNOSIS — R4182 Altered mental status, unspecified: Secondary | ICD-10-CM | POA: Diagnosis not present

## 2021-10-17 DIAGNOSIS — G894 Chronic pain syndrome: Secondary | ICD-10-CM

## 2021-10-17 DIAGNOSIS — J9612 Chronic respiratory failure with hypercapnia: Secondary | ICD-10-CM

## 2021-10-17 DIAGNOSIS — G588 Other specified mononeuropathies: Secondary | ICD-10-CM

## 2021-10-17 DIAGNOSIS — R627 Adult failure to thrive: Secondary | ICD-10-CM | POA: Diagnosis not present

## 2021-10-17 DIAGNOSIS — I10 Essential (primary) hypertension: Secondary | ICD-10-CM | POA: Diagnosis not present

## 2021-10-17 DIAGNOSIS — G9340 Encephalopathy, unspecified: Secondary | ICD-10-CM | POA: Diagnosis not present

## 2021-10-17 DIAGNOSIS — R531 Weakness: Secondary | ICD-10-CM | POA: Diagnosis not present

## 2021-10-17 DIAGNOSIS — Z7401 Bed confinement status: Secondary | ICD-10-CM | POA: Diagnosis not present

## 2021-10-17 LAB — BASIC METABOLIC PANEL
BUN: 28 mg/dL — ABNORMAL HIGH (ref 8–23)
CO2: >45 mmol/L — ABNORMAL HIGH (ref 22–32)
Calcium: 8.8 mg/dL — ABNORMAL LOW (ref 8.9–10.3)
Chloride: 87 mmol/L — ABNORMAL LOW (ref 98–111)
Creatinine, Ser: 1.01 mg/dL — ABNORMAL HIGH (ref 0.44–1.00)
GFR, Estimated: 53 mL/min — ABNORMAL LOW (ref >60)
Glucose, Bld: 102 mg/dL — ABNORMAL HIGH (ref 70–99)
Potassium: 4 mmol/L (ref 3.5–5.1)
Sodium: 138 mmol/L (ref 135–145)

## 2021-10-17 LAB — CBC
HCT: 32.9 % — ABNORMAL LOW (ref 36.0–46.0)
Hemoglobin: 10.3 g/dL — ABNORMAL LOW (ref 12.0–15.0)
MCH: 31.8 pg (ref 26.0–34.0)
MCHC: 31.3 g/dL (ref 30.0–36.0)
MCV: 101.5 fL — ABNORMAL HIGH (ref 80.0–100.0)
Platelets: 157 10*3/uL (ref 150–400)
RBC: 3.24 MIL/uL — ABNORMAL LOW (ref 3.87–5.11)
RDW: 14.3 % (ref 11.5–15.5)
WBC: 5 10*3/uL (ref 4.0–10.5)
nRBC: 0 % (ref 0.0–0.2)

## 2021-10-17 MED ORDER — AMOXICILLIN-POT CLAVULANATE 500-125 MG PO TABS: 500.0000 mg | ORAL_TABLET | Freq: Two times a day (BID) | ORAL | 0 refills | Status: AC

## 2021-10-17 NOTE — Progress Notes (Signed)
PT Cancellation Note  Patient Details Name: [name] MRN: [id number] DOB: September 15, 1928   Cancelled Treatment:    Reason Eval/Treat Not Completed: PT screened, no needs identified, will sign off PT screened on today (7/22), 7/16, 7/15, and 7/11 (multiple recent admissions).  No needs identified, will sign off. Patient is bed bound at baseline and has assistance of daughter for ADLs. Patient assists as she can at bed level. Patient is at her baseline in regards to functional abilities.   [name] 10/17/2021, 9:18 AM

## 2021-10-17 NOTE — Discharge Summary (Signed)
Physician Discharge Summary  [name]:[id number] DOB: 1928/04/17 DOA: 10/16/2021  PCP: Ramiro Harvest, PA-C  Admit date: 10/16/2021 Discharge date: 10/17/2021 Admitted From: Home Disposition: Home Recommendations for Outpatient Follow-up:  Follow up with PCP in 1 week Check CMP and CBC in 1 week Ensure she is taking his Synthroid.  Check TSH in 4 to 6 weeks.  Patient is at risk for polypharmacy on oxycodone, gabapentin and trazodone.  Recommend reviewing Please follow up on the following pending results: Final result on urine culture  Home Health: [facility] PT/OT/RN/CSW Equipment/Devices: Patient has appropriate DME's  Discharge Condition: Stable CODE STATUS: DNR/DNI  Follow-up Information     Ramiro Harvest, PA-C. Schedule an appointment as soon as possible for a visit in 1 week(s).   Specialty: Family Medicine Contact information: [address] [place] [address] [facility] 88 year old F with PMH of stage IV ovarian cancer, diastolic CHF, NICM s/p BiV ICD, COPD, chronic RF on 2 L, chronic back pain, hypothyroidism with markedly elevated TSH, peripheral neuropathy, cognitive impairment and recurrent hospitalization including recent hospitalization for right-sided pneumonia, acute on chronic respiratory failure and left pleural effusion, brought to ED due to altered mental status, generalized weakness and gradual decline, and admitted for acute encephalopathy, failure to thrive and possible acute on chronic diastolic CHF.   In ED, vitals stable.  Labs without significant finding other than elevated CO2 to 42.  BNP 326.  Pro-Cal negative.  Troponin negative.  TSH elevated to 26 but lower than recent values.  UA with large LE but no nitrite or bacteria.  VBG 7.4/83/37/50 suggesting chronic respiratory acidosis.  CXR with decrease in pulmonary vascular congestion but increasing opacity in left middle and left lower lung field  suggesting increasing small to moderate left pleural effusion and possibly underlying atelectasis/pneumonia.  Cultures obtained.  Patient was started on CTX and azithromycin.  CT head ordered.  The next day, patient's encephalopathy resolved.  She is awake and oriented x4 except date.  CT head without acute finding.  Blood cultures negative.  Urine culture showed Enterococcus faecalis and Proteus penneri but patient without urinary symptoms, leukocytosis or fever.  She is discharged on p.o. Augmentin for 5 days although a urine culture finding might be just bacteriuria and her mental status improved without appropriate antibiotics.   Given her VBG finding, patient was weaned to room air and maintained appropriate saturation.  She has been advised to use minimum oxygen to keep his saturation above 88% due to risk for CO2 narcosis.  In addition, she was also advised to discuss her oxycodone, gabapentin and trazodone with her prescribers due to risk of polypharmacy, encephalopathy and respiratory distress.  She has been advised to take his Synthroid regularly.   Of note, multiple attempts to call patient's daughter for update and recommendation were unsuccessful.  CSW called [facility] department and APS. When an officer arrived at the house, "patient's daughter reported blocking the numbers".  Unfortunately, phone calls kept on dropping even after I was told that she has unblocked the numbers.  Per CSW, patient has an open case with APS.  CSW updated APS about 5 ED visits in the last 2 weeks.    See individual problem list below for more.   Problems addressed during this hospitalization Principal Problem:   Acute encephalopathy Active Problems:   Failure to thrive in  adult   Acute on chronic diastolic CHF (congestive heart failure) (HCC)   Pleural effusion on left   Severe hypothyroidism   COPD with Chronic respiratory failure with hypoxia (HCC)   Essential hypertension   Peripheral  neuropathy secondary to chemotherapy   Chronic pain syndrome   Pulmonary nodules   COPD (chronic obstructive pulmonary disease) (HCC)   Acute toxic metabolic encephalopathy: Likely due to CO2 narcosis and polypharmacy.  She has chronic CO2 retention due to excessive oxygen use and underlying COPD but compensated with normal pH.  She is also on significant dose of oxycodone, gabapentin and trazodone which puts her at risk of polypharmacy.  His TSH is elevated to 26 but significantly improved compared to recent values.  CT head without acute finding.  CXR raises concern for pneumonia but she just completed treatment for this.  She has no fever or leukocytosis.  Her Pro-Cal is negative.  UA with pyuria.  Urine culture with 1000 colonies of Enterococcus faecalis and 10,000 colonies of Proteus Penneri.  However, I doubt she has a UTI as her encephalopathy seems to have resolved without appropriate antibiotics.  She has no focal neuro symptoms to suggest CVA.  She is oriented x4 except date.  -Emphasized the importance of taking his Synthroid -Discussed risk of polypharmacy and encouraged to discuss him medications with prescribers -Discussed risk of CO2 narcosis and advised to use minimum oxygen to keep his saturation above 88%.  She maintained appropriate saturation on room air here. -Sent Rx for Augmentin 500 mg twice daily for 5 days although his urine culture is likely bacteriuria   Pyuria/bacteriuria: She has no UTI symptoms.  She has no fever or leukocytosis.  Pro-Cal negative.  Mental status improved without appropriate antibiotics -P.o. Augmentin 500 mg twice daily for 5 days  Failure to thrive in adult: Has had a 2 week decline in ability to take care of herself and feed herself and do her baseline ADLs. Now needs help to eat and has had gradual progression of poor PO intake.  Per admitting provider daughter refused placement and palliative care consult.  -[facility] PT/OT/RN/CSW -Per CSW,  patient has an open APS case   Chronic diastolic CHF: Acute CHF ruled out.  She appears euvolemic without cardiopulmonary symptoms -Continue home medications   Pleural effusion on left: Chronic likely from CHF and recent pneumonia.   Severe hypothyroidism: TSH elevated to 26 but better than recent values. -Continue home Synthroid -Check TSH in 4 to 6 weeks   COPD with Chronic respiratory failure with hypoxia (HCC) Chronic respiratory acidosis: As noted on VBG. -As above.   Essential hypertension -Continue home meds   Peripheral neuropathy secondary to chemotherapy -Continue gabapentin   Chronic pain syndrome: On oxycodone and gabapentin at home.  At risk for polypharmacy. -Recommend reassessing and adjusting   Pulmonary nodules -CT scan 7/8 showed 8 mm and 5 mm sized pulm nodules in the right lower lobes.  Recommend repeat CT chest in 3 to 6 months.  Vital signs Vitals:   10/17/21 0256 10/17/21 0500 10/17/21 0639 10/17/21 1310  BP: (!) 147/66  (!) 151/76 (!) 166/73  Pulse: 66  71 64  Temp: 98.4 F (36.9 C)  98.1 F (36.7 C) 98.3 F (36.8 C)  Resp: _0 Height:      Weight:  67.4 kg    SpO2: 100%  100% 100%  TempSrc: Oral  Oral Oral  BMI (Calculated):  27.17  Discharge exam  GENERAL: No apparent distress.  Nontoxic. HEENT: MMM.  Vision and hearing grossly intact.  NECK: Supple.  No apparent JVD.  RESP:  No IWOB.  Fair aeration bilaterally. CVS:  RRR. Heart sounds normal.  ABD/GI/GU: BS+. Abd soft, NTND.  MSK/EXT:  Moves extremities. No apparent deformity. No edema.  SKIN: no apparent skin lesion or wound NEURO: Awake and alert. Oriented x4 except date.  No apparent focal neuro deficit. PSYCH: Calm. Normal affect.   Discharge Instructions Discharge Instructions     Call MD for:  difficulty breathing, headache or visual disturbances   Complete by: As directed    Call MD for:  extreme fatigue   Complete by: As directed    Call MD for:  persistant  dizziness or light-headedness   Complete by: As directed    Call MD for:  temperature >100.4   Complete by: As directed    Diet general   Complete by: As directed    Discharge instructions   Complete by: As directed    It has been a pleasure taking care of you!  You were hospitalized due to altered mental status and generalized weakness.  Your symptoms improved.  We noted that your carbon dioxide is high, likely from excessive oxygen usage and pain medications.  We recommend using minimum oxygen to keep your oxygen level above 88%.  Too much oxygen is not good in people with COPD.  We also recommend talking to you prescribers about some of your medications including oxycodone, gabapentin and trazodone.  It is also very important that you take your thyroid medication consistently.   Be aware oxycodone, gabapentin and trazodone can cause problems including drowsiness, sedation, constipation, impaired judgment, respiratory depression that could potentially lead to death and impaired balance that could increase risk of fall.  We strongly recommend cutting down on these medications or talking to your doctors who prescribe them.  We do not recommend driving, operating machinery or other activity that requires similar mental and physical engagement.     Take care,   Increase activity slowly   Complete by: As directed       Allergies as of 10/17/2021       Reactions   Digitalis Other (See Comments)   Poisoned patient - patient was hospitalized   Losartan Other (See Comments)    Unknown        Medication List     STOP taking these medications    amoxicillin-clavulanate 875-125 MG tablet Commonly known as: AUGMENTIN       TAKE these medications    albuterol 108 (90 Base) MCG/ACT inhaler Commonly known as: VENTOLIN HFA Inhale 2 puffs into the lungs as needed for wheezing or shortness of breath.   carvedilol 12.5 MG tablet Commonly known as: COREG Take 1 tablet (12.5 mg total)  by mouth in the morning and at bedtime.   docusate sodium 100 MG capsule Commonly known as: COLACE Take 1 capsule (100 mg total) by mouth every 12 (twelve) hours.   dorzolamide-timolol 22.3-6.8 MG/ML ophthalmic solution Commonly known as: COSOPT Place 1 drop into both eyes 2 (two) times daily.   Dulera 100-5 MCG/ACT Aero Generic drug: mometasone-formoterol Inhale 2 puffs into the lungs as needed for wheezing or shortness of breath.   DULoxetine 60 MG capsule Commonly known as: CYMBALTA Take 60 mg by mouth daily.   feeding supplement Liqd Take 237 mLs by mouth 2 times daily between meals.   furosemide 40 MG tablet Commonly known  as: LASIX TAKE 1 TABLET BY MOUTH EVERY DAY What changed:  how much to take when to take this additional instructions   gabapentin 300 MG capsule Commonly known as: NEURONTIN Take 900 mg by mouth as needed (For pain).   hydrocortisone 25 MG suppository Commonly known as: ANUSOL-HC Insert 1 suppository rectally 2 times daily as needed for hemorrhoids or anal itching.   ipratropium-albuterol 0.5-2.5 (3) MG/3ML Soln Commonly known as: DUONEB Inhale 1 vial via nebulizer every 6 hours as needed.   latanoprost 0.005 % ophthalmic solution Commonly known as: XALATAN Place 1 drop into both eyes at bedtime.   oxyCODONE 15 MG immediate release tablet Commonly known as: ROXICODONE Take 1 tablet (15 mg total) by mouth every 6 (six) hours as needed for pain. What changed: when to take this   polyethylene glycol 17 g packet Commonly known as: MIRALAX / GLYCOLAX Take 17 g by mouth daily.   PRENATAL VITAMINS PO Take 1 tablet by mouth daily.   Synthroid 112 MCG tablet Generic drug: levothyroxine Take 112 mcg by mouth daily before breakfast.   traZODone 50 MG tablet Commonly known as: DESYREL Take 50 mg by mouth at bedtime.   VITAMIN D3 PO Take 2 tablets by mouth daily.   Zinc Oxide 10 % Oint Apply 1 application  topically See admin  instructions. 1 application 2- 4 times a day as needed for bed sores and irritation.        Consultations: None  Procedures/Studies:   CT HEAD WO CONTRAST (5MM)  Result Date: 10/17/2021 CLINICAL DATA:  Altered mental status, nontraumatic. EXAM: CT HEAD WITHOUT CONTRAST TECHNIQUE: Contiguous axial images were obtained from the base of the skull through the vertex without intravenous contrast. RADIATION DOSE REDUCTION: This exam was performed according to the departmental dose-optimization program which includes automated exposure control, adjustment of the mA and/or kV according to patient size and/or use of iterative reconstruction technique. COMPARISON:  10/03/2021 FINDINGS: Brain: No evidence of acute infarction, hemorrhage, hydrocephalus, extra-axial collection or mass lesion/mass effect. There is a CSF attenuating structure within the right anterior temporal fossa which appears unchanged from previous exam and is favored to represent a benign arachnoid cyst. There is mild diffuse low-attenuation within the subcortical and periventricular white matter compatible with chronic microvascular disease. Prominence of the sulci and ventricles compatible with brain atrophy. Vascular: No hyperdense vessel or unexpected calcification. Skull: Normal. Negative for fracture or focal lesion. Sinuses/Orbits: No acute finding. Other: None. IMPRESSION: 1. No acute intracranial abnormalities. 2. Chronic small vessel ischemic disease and brain atrophy. 3. Stable right anterior temporal fossa arachnoid cyst. Electronically Signed   By: Kerby Moors M.D.   On: 10/17/2021 11:03   DG Chest Port 1 View  Result Date: 10/16/2021 CLINICAL DATA:  Altered mental status EXAM: PORTABLE CHEST 1 VIEW COMPARISON:  10/13/2021 FINDINGS: Transverse diameter of the heart is increased. Pacemaker/defibrillator battery is seen in the left infraclavicular region. Dense calcification is seen in mitral annulus. Biventricular pacer leads  are noted in place. There is elevation of left hemidiaphragm. There is increased density in the left lower lung field obscuring the left hemidiaphragm. There is interval worsening of this finding. Central pulmonary vessels are less prominent. Left upper lung field and right lung are clear of any focal infiltrates or signs of pulmonary edema. IMPRESSION: There is interval decrease in pulmonary vascular congestion. There is interval increase in opacity in left mid and left lower lung fields suggesting increase in small to moderate sized left pleural  effusion and possibly underlying atelectasis/pneumonia. Electronically Signed   By: Elmer Picker M.D.   On: 10/16/2021 16:28   CT Chest Wo Contrast  Result Date: 10/13/2021 CLINICAL DATA:  Pneumonia, complication suspected, xray done EXAM: CT CHEST WITHOUT CONTRAST TECHNIQUE: Multidetector CT imaging of the chest was performed following the standard protocol without IV contrast. RADIATION DOSE REDUCTION: This exam was performed according to the departmental dose-optimization program which includes automated exposure control, adjustment of the mA and/or kV according to patient size and/or use of iterative reconstruction technique. COMPARISON:  10/08/2021 FINDINGS: Cardiovascular: Heart is normal size. Left AICD remains in place with leads in the right atrium, right ventricle and coronary sinus. Densely calcified mitral valve annulus. Calcified, tortuous aorta. No aneurysm. Mediastinum/Nodes: No mediastinal, hilar, or axillary adenopathy. Trachea and esophagus are unremarkable. Thyroid unremarkable. Lungs/Pleura: Small to moderate left pleural effusion, stable since prior study. Airspace disease in the left lower lobe and lingula could reflect atelectasis or scarring. Trace right pleural effusion, slightly decreased since prior study. Dependent atelectasis in the right lower lobe. Tree-in-bud nodular densities seen in the right upper lobe and right lower lobe are  unchanged since prior study. No new areas of consolidation. Upper Abdomen: No acute findings Musculoskeletal: Chest wall soft tissues are unremarkable. No acute bony abnormality. Chronic T12 compression fracture. IMPRESSION: Stable small to moderate left pleural effusion. Airspace disease in the left lower lobe and lingula could reflect compressive atelectasis or pneumonia. Tree-in-bud nodular densities in the right upper lobe and right lower lobe, likely infectious/inflammatory. These are unchanged. Trace right pleural effusion, slightly decreased since prior study. Aortic Atherosclerosis (ICD10-I70.0). Electronically Signed   By: Rolm Baptise M.D.   On: 10/13/2021 13:54   CT Head Wo Contrast  Result Date: 10/13/2021 CLINICAL DATA:  Mental status change, unknown cause EXAM: CT HEAD WITHOUT CONTRAST TECHNIQUE: Contiguous axial images were obtained from the base of the skull through the vertex without intravenous contrast. RADIATION DOSE REDUCTION: This exam was performed according to the departmental dose-optimization program which includes automated exposure control, adjustment of the mA and/or kV according to patient size and/or use of iterative reconstruction technique. COMPARISON:  10/03/2021 FINDINGS: Brain: Probable right joint cyst in the right middle cranial fossa is stable. There is atrophy and chronic small vessel disease changes. No acute intracranial abnormality. Specifically, no hemorrhage, hydrocephalus, acute infarction, or significant intracranial injury. Vascular: No hyperdense vessel or unexpected calcification. Skull: No acute calvarial abnormality. Sinuses/Orbits: No acute findings Other: None IMPRESSION: Atrophy, chronic microvascular disease. No acute intracranial abnormality. Electronically Signed   By: Rolm Baptise M.D.   On: 10/13/2021 13:50   DG Chest 2 View  Result Date: 10/13/2021 CLINICAL DATA:  Altered mental status.  Weakness.  Recent pneumonia. EXAM: CHEST - 2 VIEW COMPARISON:   10/08/2021 FINDINGS: Aortic atherosclerosis as seen previously. Pacemaker AICD is seen previously. Similar pattern of mild atelectasis at the right lung base and left effusion with left lower lobe volume loss/pneumonia. No new finding allowing for positioning differences. IMPRESSION: No prevertebral change since the study of 07/13. Persistent left effusion with left base atelectasis/pneumonia. Mild patchy density at the right lung base. Electronically Signed   By: Nelson Chimes M.D.   On: 10/13/2021 11:07   ECHOCARDIOGRAM COMPLETE  Result Date: 10/11/2021    ECHOCARDIOGRAM REPORT   Patient Name:   AYIANNA DARNOLD Date of Exam: 10/11/2021 Medical Rec #:  [id number]     Height:       62.0 in Accession #:  [id number]    Weight:       135.0 lb Date of Birth:  02/09/1929     BSA:          1.618 m Patient Age:    50 years      BP:           128/83 mmHg Patient Gender: F             HR:           66 bpm. Exam Location:  Inpatient Procedure: 2D Echo, Cardiac Doppler and Color Doppler Indications:    Abnormal ECG 794.31 / R94.31  History:        Patient has prior history of Echocardiogram examinations, most                 recent 02/12/2020. Cardiomyopathy, Pacemaker, COPD,                 Signs/Symptoms:Dyspnea; Risk Factors:Hypertension and                 Dyslipidemia.  Sonographer:    Luane School RDCS Referring Phys: [id number] [place]  1. Left ventricular ejection fraction, by estimation, is >75%. The left ventricle has hyperdynamic function. The left ventricle has no regional wall motion abnormalities. There is severe concentric left ventricular hypertrophy. Diastolic function indeterminant due to severe MAC. Elevated left atrial pressure.  2. Right ventricular systolic function is normal. The right ventricular size is normal. There is normal pulmonary artery systolic pressure.  3. Left atrial size was severely dilated.  4. The mitral valve is degenerative. Trivial mitral valve regurgitation. Mild mitral  stenosis. The mean mitral valve gradient is 4.0 mmHg at HR 61. MVA 2.1cm2 by continuity. Severe mitral annular calcification.  5. The aortic valve is tricuspid. There is moderate calcification of the aortic valve. There is moderate thickening of the aortic valve. Aortic valve regurgitation is trivial. Suspect mild aortic stenosis by visual inspection. Suboptimal doppler interrogation on current study. Comparison(s): No significant change from prior study on 01/2020. FINDINGS  Left Ventricle: Left ventricular ejection fraction, by estimation, is >75%. The left ventricle has hyperdynamic function. The left ventricle has no regional wall motion abnormalities. The left ventricular internal cavity size was small. There is severe concentric left ventricular hypertrophy. Diastolic function indeterminant due to severe MAC. Elevated left atrial pressure. Right Ventricle: The right ventricular size is normal. No increase in right ventricular wall thickness. Right ventricular systolic function is normal. There is normal pulmonary artery systolic pressure. The tricuspid regurgitant velocity is 2.39 m/s, and  with an assumed right atrial pressure of 3 mmHg, the estimated right ventricular systolic pressure is 43.3 mmHg. Left Atrium: Left atrial size was severely dilated. Right Atrium: Right atrial size was not well visualized. Pericardium: There is no evidence of pericardial effusion. Mitral Valve: The mitral valve is degenerative in appearance. There is moderate thickening of the mitral valve leaflet(s). There is moderate calcification of the mitral valve leaflet(s). Severe mitral annular calcification. Trivial mitral valve regurgitation. Mild mitral valve stenosis. MV peak gradient, 9.7 mmHg. The mean mitral valve gradient is 4.0 mmHg. Tricuspid Valve: The tricuspid valve is normal in structure. Tricuspid valve regurgitation is trivial. Aortic Valve: The aortic valve is tricuspid. There is moderate calcification of the aortic  valve. There is moderate thickening of the aortic valve. Aortic valve regurgitation is trivial. Aortic regurgitation PHT measures 593 msec. Suspect mild aortic stenosis by visual inspection. Suboptimal doppler interrogation on current study.  Pulmonic Valve: The pulmonic valve was not well visualized. Pulmonic valve regurgitation is trivial. Aorta: The aortic root and ascending aorta are structurally normal, with no evidence of dilitation. Venous: The inferior vena cava was not well visualized. IAS/Shunts: The atrial septum is grossly normal. Additional Comments: A device lead is visualized.  LEFT VENTRICLE PLAX 2D LVIDd:         3.20 cm   Diastology LVIDs:         2.30 cm   LV e' medial:    2.94 cm/s LV PW:         1.50 cm   LV E/e' medial:  32.5 LV IVS:        1.50 cm   LV e' lateral:   3.81 cm/s LVOT diam:     2.00 cm   LV E/e' lateral: 25.1 LV SV:         97 LV SV Index:   60 LVOT Area:     3.14 cm  RIGHT VENTRICLE             IVC RV S prime:     10.30 cm/s  IVC diam: 1.10 cm TAPSE (M-mode): 2.0 cm LEFT ATRIUM             Index        RIGHT ATRIUM           Index LA diam:        3.90 cm 2.41 cm/m   RA Area:     13.20 cm LA Vol (A2C):   88.6 ml 54.77 ml/m  RA Volume:   25.80 ml  15.95 ml/m LA Vol (A4C):   85.0 ml 52.55 ml/m LA Biplane Vol: 90.3 ml 55.82 ml/m  AORTIC VALVE LVOT Vmax:   133.00 cm/s LVOT Vmean:  83.900 cm/s LVOT VTI:    0.309 m AI PHT:      593 msec  AORTA Ao Root diam: 3.00 cm Ao Asc diam:  3.30 cm Ao Desc diam: 2.10 cm MITRAL VALVE                TRICUSPID VALVE MV Area (PHT): 2.87 cm     TR Peak grad:   22.8 mmHg MV Area VTI:   2.16 cm     TR Vmax:        239.00 cm/s MV Peak grad:  9.7 mmHg MV Mean grad:  4.0 mmHg     SHUNTS MV Vmax:       1.55 m/s     Systemic VTI:  0.31 m MV Vmean:      91.3 cm/s    Systemic Diam: 2.00 cm MV Decel Time: 264 msec MV E velocity: 95.50 cm/s MV A velocity: 159.00 cm/s MV E/A ratio:  0.60 Gwyndolyn Kaufman MD Electronically signed by Gwyndolyn Kaufman MD  Signature Date/Time: 10/11/2021/3:13:09 PM    Final    [name] CHEST (PLEURAL EFFUSION)  Result Date: 10/09/2021 CLINICAL DATA:  Left pleural effusion. EXAM: CHEST ULTRASOUND COMPARISON:  CTA of the chest on 10/08/2021 FINDINGS: Ultrasound was performed demonstrating only a small volume left pleural effusion which is not large enough to perform thoracentesis. Atelectasis present at the left lung base. IMPRESSION: Small volume left pleural effusion. Thoracentesis was not performed. Electronically Signed   By: Aletta Edouard M.D.   On: 10/09/2021 12:18   CT Angio Chest/Abd/Pel for Dissection W and/or Wo Contrast  Result Date: 10/08/2021 CLINICAL DATA:  Acute aortic syndrome (AAS) suspected. Nausea, weakness. EXAM: CT ANGIOGRAPHY CHEST,  ABDOMEN AND PELVIS TECHNIQUE: Non-contrast CT of the chest was initially obtained. Multidetector CT imaging through the chest, abdomen and pelvis was performed using the standard protocol during bolus administration of intravenous contrast. Multiplanar reconstructed images and MIPs were obtained and reviewed to evaluate the vascular anatomy. RADIATION DOSE REDUCTION: This exam was performed according to the departmental dose-optimization program which includes automated exposure control, adjustment of the mA and/or kV according to patient size and/or use of iterative reconstruction technique. CONTRAST:  138m OMNIPAQUE IOHEXOL 350 MG/ML SOLN COMPARISON:  10/03/2021 FINDINGS: CTA CHEST FINDINGS Cardiovascular: Heart is normal size. Densely calcified and tortuous aorta. No aneurysm or dissection. No filling defects in the pulmonary arteries to suggest pulmonary emboli. Scattered coronary artery calcifications. Pacer wires in the right heart. Prominent central pulmonary arteries compatible with pulmonary arterial hypertension, stable. Mediastinum/Nodes: No mediastinal, hilar, or axillary adenopathy. Trachea and esophagus are unremarkable. Thyroid unremarkable. Lungs/Pleura: Moderate  left pleural effusion, enlarging since prior study. Compressive atelectasis in the left lower lobe. Trace right effusion with dependent atelectasis in the right lung base. Tree-in-bud nodular densities seen within the right upper lobe and right lower lobe, similar to prior study. These are most likely infectious/inflammatory small airways disease. Musculoskeletal: Chest wall soft tissues are unremarkable. Diffuse osteopenia. Moderate compression fracture at T12, stable. Diffuse degenerative changes. Review of the MIP images confirms the above findings. CTA ABDOMEN AND PELVIS FINDINGS VASCULAR Aorta: Heavily calcified and markedly tortuous. No aneurysm or dissection. Celiac: Patent SMA: Patent Renals: Patent bilaterally. IMA: Patent Inflow: Atherosclerotic calcifications.  No aneurysm or dissection. Veins: No obvious venous abnormality within the limitations of this arterial phase study. Review of the MIP images confirms the above findings. NON-VASCULAR Hepatobiliary: No focal liver abnormality is seen. Status post cholecystectomy. No biliary dilatation. Pancreas: No focal abnormality or ductal dilatation. Spleen: No focal abnormality.  Normal size. Adrenals/Urinary Tract: Left kidney is atrophic. Mild fullness of the right renal collecting system. This is unchanged since prior study. Right ureter is decompressed. Mild fullness of the adrenal glands, stable. Urinary bladder unremarkable. Stomach/Bowel: Moderate stool throughout the colon. Mild gaseous distention of the right colon and transverse colon. No evidence of bowel obstruction. Stomach and small bowel decompressed, grossly unremarkable. Lymphatic: No adenopathy. Reproductive:  Prior hysterectomy.  No adnexal masses. Other: No free fluid or free air. Small left periumbilical hernia containing small bowel loops. Musculoskeletal: Scoliosis and severe degenerative changes in the lumbar spine. No acute bony abnormality. Review of the MIP images confirms the above  findings. IMPRESSION: Markedly tortuous and calcified aorta. No evidence of aneurysm or dissection. Moderate left pleural effusion, increasing since prior study with compressive atelectasis in the left lower lobe. Trace right pleural effusion with dependent atelectasis in the right lower lobe. Tree-in-bud nodular densities in the right upper lobe and right lower lobe, likely infectious/inflammatory small airways disease. Findings similar to prior study. Atrophic left kidney. Mild fullness of the right renal collecting system/pelvis, similar prior study. This may reflect chronic UPJ obstruction. Small left paraumbilical ventral hernia containing small bowel loops. No bowel obstruction. Electronically Signed   By: KRolm BaptiseM.D.   On: 10/08/2021 17:35   DG Chest Portable 1 View  Result Date: 10/08/2021 CLINICAL DATA:  Altered mental status, nausea, weak S, CHF, COPD, hypertension EXAM: PORTABLE CHEST 1 VIEW COMPARISON:  Portable exam 1317 hours compared to 10/03/2021 FINDINGS: LEFT subclavian ICD leads project over RIGHT atrium, RIGHT ventricle, coronary sinus. Upper normal size of cardiac silhouette. Mediastinal contours and pulmonary vascularity normal.  Atherosclerotic calcification aorta. LEFT pleural effusion and basilar atelectasis with mild elevation of LEFT diaphragm noted. Remaining lungs clear. No acute infiltrate or pneumothorax. Bones demineralized. IMPRESSION: RIGHT pleural effusion and basilar atelectasis. Aortic Atherosclerosis (ICD10-I70.0). Electronically Signed   By: Lavonia Dana M.D.   On: 10/08/2021 13:26   CT Angio Chest PE W and/or Wo Contrast  Result Date: 10/03/2021 CLINICAL DATA:  Suspected pulmonary embolus. Nausea vomiting and diarrhea.  Weakness. EXAM: CT ANGIOGRAPHY CHEST CT ABDOMEN AND PELVIS WITH CONTRAST TECHNIQUE: Multidetector CT imaging of the chest was performed using the standard protocol during bolus administration of intravenous contrast. Multiplanar CT image  reconstructions and MIPs were obtained to evaluate the vascular anatomy. Multidetector CT imaging of the abdomen and pelvis was performed using the standard protocol during bolus administration of intravenous contrast. RADIATION DOSE REDUCTION: This exam was performed according to the departmental dose-optimization program which includes automated exposure control, adjustment of the mA and/or kV according to patient size and/or use of iterative reconstruction technique. CONTRAST:  177m OMNIPAQUE IOHEXOL 350 MG/ML SOLN COMPARISON:  Chest CT March 15, 2019 CT of the abdomen May 17, 2015 FINDINGS: CTA CHEST FINDINGS Cardiovascular: Satisfactory opacification of the pulmonary arteries to the segmental level. No evidence of pulmonary embolism. Pulmonary artery enlargement, usually associated with pulmonary arterial hypertension. Enlarged heart. Molar calcifications. Calcific atherosclerotic disease of the aorta. Mediastinum/Nodes: No enlarged mediastinal, hilar, or axillary lymph nodes. Thyroid gland, trachea, and esophagus demonstrate no significant findings. Lungs/Pleura: Small left pleural effusion. Left lower lobe atelectasis. Subtle tree in bud opacities in the right upper lobe and right middle lobe, likely inflammatory. Musculoskeletal: No chest wall abnormality. No acute or significant osseous findings. Cardiac pacemaker noted. Review of the MIP images confirms the above findings. CT ABDOMEN and PELVIS FINDINGS Hepatobiliary: No focal liver abnormality is seen. Status post cholecystectomy. No biliary dilatation. Pancreas: Unremarkable. No pancreatic ductal dilatation or surrounding inflammatory changes. Spleen: Normal in size without focal abnormality. Adrenals/Urinary Tract: Prominent adrenal glands. Atrophic left kidney. Bilateral extrarenal pelvises. Normal urinary bladder. Stomach/Bowel: Stomach is within normal limits. No evidence of bowel distention, or inflammatory changes. Non specific diffuse  mucosal thickening of the rectum. Vascular/Lymphatic: Aortic atherosclerosis. No enlarged abdominal or pelvic lymph nodes. Reproductive: Status post hysterectomy. No adnexal masses. Other: Left supraumbilical anterior abdominal wall hernia with wide neck containing norm incarcerated bowel. A second narrower neck left anterior abdominal wall hernia, more inferiorly contains nonincarcerated small bowel. Musculoskeletal: Inferior endplate compression deformity of T12 vertebral body without unknown acuity 2. Multilevel degenerative changes of the lumbosacral spine. Has shaped scoliosis. Review of the MIP images confirms the above findings. IMPRESSION: 1. No evidence of pulmonary embolus. 2. Pulmonary artery enlargement, usually associated with pulmonary arterial hypertension. 3. Small left pleural effusion with left lower lobe atelectasis. 4. Subtle tree in bud opacities in the right upper lobe and right middle lobe, likely inflammatory or infectious. 5. No acute abnormalities in the abdomen or pelvis. 6. Atrophic left kidney. 7. Two left anterior abdominal wall hernias containing non incarcerated bowel. 8. Non specific diffuse mucosal thickening of the rectum. Please correlate with patient's symptoms. 9. Age-indeterminate compression fracture of T12 vertebral body with approximately 50% height loss. Aortic Atherosclerosis (ICD10-I70.0). Electronically Signed   By: DFidela SalisburyM.D.   On: 10/03/2021 17:04   CT ABDOMEN PELVIS W CONTRAST  Result Date: 10/03/2021 CLINICAL DATA:  Nausea and vomiting.  Abdominal pain.  05/17/2015 EXAM: CT ABDOMEN AND PELVIS WITH CONTRAST TECHNIQUE: Multidetector CT imaging of the abdomen  and pelvis was performed using the standard protocol following bolus administration of intravenous contrast. RADIATION DOSE REDUCTION: This exam was performed according to the departmental dose-optimization program which includes automated exposure control, adjustment of the mA and/or kV according  to patient size and/or use of iterative reconstruction technique. CONTRAST:  191m OMNIPAQUE IOHEXOL 350 MG/ML SOLN COMPARISON:  None Available. FINDINGS: Lower chest: Mild collapse/consolidative opacity seen in the lingula and left lower lobe. Bronchiectasis with airway impaction is seen inferior right middle and lower lobes. 8 mm right lower lobe nodule seen on 30/6/8. 5 mm right lower lobe nodule seen on image 7/8. Hepatobiliary: No suspicious focal abnormality within the liver parenchyma. Gallbladder surgically absent. No intrahepatic or extrahepatic biliary dilation. Pancreas: Pancreatic parenchyma diffusely atrophic without main duct dilatation. Spleen: No splenomegaly. No focal mass lesion. Adrenals/Urinary Tract: Bilateral adrenal gland thickening without a discrete nodule or mass. Left kidney atrophic. Mild fullness noted right intrarenal collecting system with patulous renal pelvis. No evidence for hydroureter. Contrast material in the distal left ureter is likely from CTA chest performed earlier. The urinary bladder appears normal for the degree of distention. Stomach/Bowel: Stomach is nondistended. Mild wall thickening noted in the antral region, nonspecific potentially related to peristalsis. Duodenum is normally positioned as is the ligament of Treitz. No small bowel wall thickening. No small bowel dilatation. Nonvisualization of the appendix is consistent with the reported history of appendectomy. No gross colonic mass. No colonic wall thickening. Vascular/Lymphatic: There is advanced atherosclerotic calcification of the abdominal aorta without aneurysm. There is no gastrohepatic or hepatoduodenal ligament lymphadenopathy. No retroperitoneal or mesenteric lymphadenopathy. No pelvic sidewall lymphadenopathy. Reproductive: The uterus is surgically absent. There is no adnexal mass. Other: No intraperitoneal free fluid. Musculoskeletal: No worrisome lytic or sclerotic osseous abnormality. Degenerative  changes noted right hip with evidence of underlying avascular necrosis. Compression deformity noted at T12 with advanced degenerative changes in the lumbar spine. IMPRESSION: 1. No acute findings in the abdomen or pelvis. 2. Mild wall thickening in the antral region of the stomach, nonspecific and potentially related to peristalsis. Gastritis not excluded. 3. Left renal atrophy. 4. Bronchiectasis with airway impaction in the inferior right middle and lower lobes. 5. 8 mm right lower lobe pulmonary nodule with 5 mm right lower lobe pulmonary nodule. Non-contrast chest CT at 3-6 months is recommended. If the nodules are stable at time of repeat CT, then future CT at 18-24 months (from today's scan) is considered optional for low-risk patients, but is recommended for high-risk patients. This recommendation follows the consensus statement: Guidelines for Management of Incidental Pulmonary Nodules Detected on CT Images: From the Fleischner Society 2017; Radiology 2017; 284:228-243. 6. Compression deformity at T12 with advanced degenerative changes in the lumbar spine. 7. Mild collapse/consolidative opacity seen in the lingula and left lower lobe. 8. Aortic Atherosclerosis (ICD10-I70.0). Electronically Signed   By: EMisty StanleyM.D.   On: 10/03/2021 16:57   CT Head Wo Contrast  Result Date: 10/03/2021 CLINICAL DATA:  Mental status changes. EXAM: CT HEAD WITHOUT CONTRAST TECHNIQUE: Contiguous axial images were obtained from the base of the skull through the vertex without intravenous contrast. RADIATION DOSE REDUCTION: This exam was performed according to the departmental dose-optimization program which includes automated exposure control, adjustment of the mA and/or kV according to patient size and/or use of iterative reconstruction technique. COMPARISON:  05/16/2020 FINDINGS: Brain: There is no evidence for acute hemorrhage, hydrocephalus, mass lesion, or abnormal extra-axial fluid collection. No definite CT evidence  for acute infarction. Diffuse  loss of parenchymal volume is consistent with atrophy. Patchy low attenuation in the deep hemispheric and periventricular white matter is nonspecific, but likely reflects chronic microvascular ischemic demyelination. Probable arachnoid cyst right middle cranial fossa is stable in the interval. Vascular: No hyperdense vessel or unexpected calcification. Skull: No evidence for fracture. No worrisome lytic or sclerotic lesion. Sinuses/Orbits: The visualized paranasal sinuses and mastoid air cells are clear. Visualized portions of the globes and intraorbital fat are unremarkable. Other: None. IMPRESSION: 1. No acute intracranial abnormality. 2. Atrophy with chronic small vessel ischemic disease. 3. Stable appearance arachnoid cyst anterior right middle cranial fossa. Electronically Signed   By: Misty Stanley M.D.   On: 10/03/2021 16:42   DG Chest Port 1 View  Result Date: 10/03/2021 CLINICAL DATA:  Nausea. EXAM: PORTABLE CHEST 1 VIEW COMPARISON:  None Available. FINDINGS: Mild ill-defined left basilar opacities. Otherwise, lungs are clear. Similar cardiomediastinal silhouette. Left subclavian approach cardiac rhythm maintenance device. Limited visualization left lung base. No definite pleural effusion. No visible pneumothorax. IMPRESSION: Left basilar opacities, which could represent atelectasis, aspiration, and/or pneumonia. Electronically Signed   By: Margaretha Sheffield M.D.   On: 10/03/2021 13:40       The results of significant diagnostics from this hospitalization (including imaging, microbiology, ancillary and laboratory) are listed below for reference.     Microbiology: Recent Results (from the past 240 hour(s))  Urine Culture     Status: None   Collection Time: 10/08/21  2:13 PM   Specimen: Urine, Clean Catch  Result Value Ref Range Status   Specimen Description   Final    URINE, CLEAN CATCH Performed at [facility], [place] [address]  [place], [place] [address]    Special Requests   Final    NONE Performed at [facility], [place] [address] [place], [place] [address]    Culture   Final    NO GROWTH Performed at [facility], [place] [address] [place], [place] [address]    Report Status 10/09/2021 FINAL  Final  SARS Coronavirus 2 by RT PCR (hospital order, performed in [facility] hospital lab) *cepheid single result test* Anterior Nasal Swab     Status: None   Collection Time: 10/16/21  2:30 PM   Specimen: Anterior Nasal Swab  Result Value Ref Range Status   SARS Coronavirus 2 by RT PCR NEGATIVE NEGATIVE Final    Comment: (NOTE) SARS-CoV-2 target nucleic acids are NOT DETECTED.  The SARS-CoV-2 RNA is generally detectable in upper and lower respiratory specimens during the acute phase of infection. The lowest concentration of SARS-CoV-2 viral copies this assay can detect is 250 copies / mL. A negative result does not preclude SARS-CoV-2 infection and should not be used as the sole basis for treatment or other patient management decisions.  A negative result may occur with improper specimen collection / handling, submission of specimen other than nasopharyngeal swab, presence of viral mutation(s) within the areas targeted by this assay, and inadequate number of viral copies (<250 copies / mL). A negative result must be combined with clinical observations, patient history, and epidemiological information.  Fact Sheet for Patients:   https://www.patel.info/  Fact Sheet for Healthcare Providers: https://hall.com/  This test is not yet approved or  cleared by the [place] FDA and has been authorized for detection and/or diagnosis of SARS-CoV-2 by FDA under an Emergency Use Authorization (EUA).  This EUA will remain in effect (meaning this test can be used) for the duration  of the COVID-19 declaration under Section 564(b)(1) of the Act, 21  U.S.C. section 360bbb-3(b)(1), unless the authorization is terminated or revoked sooner.  Performed at [facility], [place] [address], [place], [place] [address]   Culture, blood (routine x 2)     Status: None (Preliminary result)   Collection Time: 10/16/21  2:40 PM   Specimen: BLOOD  Result Value Ref Range Status   Specimen Description   Final    BLOOD SITE NOT SPECIFIED Performed at [place] [address] [place], [place] [address]    Special Requests   Final    BOTTLES DRAWN AEROBIC AND ANAEROBIC Blood Culture adequate volume Performed at [place] [address] [place], [place] [address]    Culture   Final    NO GROWTH < 24 HOURS Performed at [place] [address] [place], [place] [address]    Report Status PENDING  Incomplete  Culture, blood (routine x 2)     Status: None (Preliminary result)   Collection Time: 10/16/21  2:49 PM   Specimen: BLOOD  Result Value Ref Range Status   Specimen Description   Final    BLOOD SITE NOT SPECIFIED Performed at [place] [address], [place], [place] [address]    Special Requests   Final    BOTTLES DRAWN AEROBIC AND ANAEROBIC Blood Culture adequate volume Performed at [place] [address], [place], [place] [address]    Culture   Final    NO GROWTH < 24 HOURS Performed at [place] [address], [place], [place] [address]    Report Status PENDING  Incomplete  Urine Culture     Status: Abnormal (Preliminary result)   Collection Time: 10/16/21  3:15 PM   Specimen: Urine, Catheterized  Result Value Ref Range Status   Specimen Description   Final    URINE, CATHETERIZED Performed at [place] [address] [place], [place] [address]    Special Requests   Final    NONE Performed at [facility], [place] [name], [place], [place] [address]     Culture (A)  Final    >=100,000 COLONIES/mL ENTEROCOCCUS FAECALIS 10,000 COLONIES/mL PROTEUS PENNERI    Report Status PENDING  Incomplete     Labs:  CBC: Recent Labs  Lab 10/11/21 0517 10/12/21 0527 10/13/21 1036 10/16/21 1104 10/17/21 0601  WBC 7.4 7.8 5.7 5.0 5.0  NEUTROABS  --  5.3  --  3.3  --   HGB 10.7* 10.3* 10.8* 10.4* 10.3*  HCT 33.9* 33.2* 34.1* 33.1* 32.9*  MCV 100.9* 100.9* 101.8* 102.5* 101.5*  PLT 125* 128* 138* 156 157   BMP &GFR Recent Labs  Lab 10/11/21 0517 10/12/21 0527 10/13/21 1036 10/13/21 1102 10/16/21 1104 10/16/21 1938 10/17/21 0601  NA 135 137 134*  --  138  --  138  K 4.2 4.3 4.8  --  4.8  --  4.0  CL 88* 91* 91*  --  89*  --  87*  CO2 40* 39* 37*  --  42*  --  >45*  GLUCOSE 120* 118* 117*  --  110*  --  102*  BUN 26* 22 21  --  34*  --  28*  CREATININE 1.40* 1.10* 1.07*  --  1.09*  --  1.01*  CALCIUM 8.8* 8.9 9.1  --  9.1  --  8.8*  MG  --   --   --  2.1  --  2.0  --    Estimated Creatinine Clearance: 32.6 mL/min (A) (by C-G formula based on SCr of 1.01 mg/dL (H)). Liver & Pancreas: Recent Labs  Lab 10/11/21 0517 10/13/21 1036 10/16/21 1104  AST _0 ALT _1 ALKPHOS 41 40 38  BILITOT 0.3 0.5 0.5  PROT 6.5 6.9 6.8  ALBUMIN 3.2* 3.5 3.5   No results for input(s): "LIPASE", "AMYLASE" in the last 168 hours. Recent Labs  Lab 10/16/21 1731  AMMONIA 17   Diabetic: No results for input(s): "HGBA1C" in the last 72 hours. No results for input(s): "GLUCAP" in the last 168 hours. Cardiac Enzymes: No results for input(s): "CKTOTAL", "CKMB", "CKMBINDEX", "TROPONINI" in the last 168 hours. No results for input(s): "PROBNP" in the last 8760 hours. Coagulation Profile: No results for input(s): "INR", "PROTIME" in the last 168 hours. Thyroid Function Tests: Recent Labs    10/16/21 1731 10/16/21 1740  TSH  --  26.479*  FREET4 0.57*  --    Lipid Profile: No results for input(s): "CHOL", "HDL", "LDLCALC", "TRIG",  "CHOLHDL", "LDLDIRECT" in the last 72 hours. Anemia Panel: Recent Labs    10/16/21 1731  VITAMINB12 218   Urine analysis:    Component Value Date/Time   COLORURINE YELLOW 10/16/2021 [place] 10/16/2021 1515   LABSPEC 1.012 10/16/2021 1515   PHURINE 6.0 10/16/2021 1515   GLUCOSEU NEGATIVE 10/16/2021 1515   [place] 10/16/2021 [place] 10/16/2021 1515   KETONESUR NEGATIVE 10/16/2021 1515   PROTEINUR NEGATIVE 10/16/2021 1515   NITRITE NEGATIVE 10/16/2021 1515   LEUKOCYTESUR LARGE (A) 10/16/2021 1515   Sepsis Labs: Invalid input(s): "PROCALCITONIN", "LACTICIDVEN"   SIGNED:  Mercy Riding, MD  [facility] 10/17/2021, 4:31 PM

## 2021-10-17 NOTE — Progress Notes (Signed)
OT Cancellation Note  Patient Details Name: [name] MRN: [id number] DOB: February 06, 1929   Cancelled Treatment:    Reason Eval/Treat Not Completed: OT screened, no needs identified, will sign off Patient is bed bound at baseline. Nurse reported patient is able to participate in self feeding with cues to attend to task with increased confusion. OT signing off at this time.  Jackelyn Poling OTR/L, [facility] Office# [phone] Pager# [phone]  10/17/2021, 12:25 PM

## 2021-10-17 NOTE — TOC Progression Note (Addendum)
Transition of Care [facility]) - Progression Note    Patient Details  Name: [name] MRN: [id number] Date of Birth: 28-Jul-1928  Transition of Care [facility]) CM/SW Contact  Ross Ludwig, [place] Phone Number: 10/17/2021, 1:15 PM  Clinical Narrative:     CSW was informed that bedside nurse and physician have tried to contact patient's daughter since she is medically ready for discharge.  CSW attempted to contact patient's daughter on cell phone it rang once and then disconnected.  CSW then tried home and there was no answer either.  CSW reviewed past notes, and saw that patient's daughter was contacted yesterday via phone.  CSW also noticed that according to daughter, APS was involved recently for neglect, and they did not find anything.  CSW attempted to contact APS to find out if there is an open case still, message left for weekend on call APS worker to call this CSW back.  CSW also contacted [facility] non emergency to complete a welfare check on patient's family since no one has been able to speak to her after trying to call several times.  CSW awaiting for call back from [place] and APS.    1:30pm  CSW received phone call from [facility] department regarding welfare check.  Patient's daughter is at home and told deputy that she will be home all day.  Patient's daughter was saying in the background, the hospital always sends her when she is not ready to discharge yet.  CSW informed her that is up to the physician, patient's daughter said she has blocked different phone numbers from the hospital.  CSW confirmed her phone number as [phone], she states she will unblock them now.  CSW updated bedside nurse and physician via secure chat.  1:45pm  CSW received phone call from [facility] after hours worker.  Per worker, patient does currently have an open case through [facility].  Per APS worker, patient's daughter was advised to try to switch PCP to a tele visit PCP,  so she does not keep using the ED when patient runs out of medications.  Per APS worker, they can not advise if patient should be discharged from hospital today or not to be discharge because they do not have guardianship.  CSW informed APS that patient is medically ready for discharge per physician.  CSW updated APS about the 5 ED visits in the last two weeks.  CSW also reported what the daughter said about APS saying they did not find anything concerning.  Per APS worker, they did not say anything, because the case is still be investigated.   1:55pm  CSW received phone call from patient's daughter, she said she does not feel patient should be discharged today, because every time she goes to the hospital she comes back worse then she was.  Patient's daughter expressed concern that patient's thyroid is not correct yet and she wanted her to stay longer.  CSW suggested she call the unit to discuss with the nurse the medical concerns.  CSW provided daughter with the unit phone number to call and ask to the nurse to ask her questions.  Patient's daughter said she will be home today and to arrange PTAR for EMS transport.  CSW updated attending physician and bedside nurse.  CSW to arrange for EMS transport back home to her patient's daughter's house.    CSW trying to find out what [facility] agency is open to patient.  HH orders were put in yesterday,  however there is no documentation of agency.  Patient was open to [facility] about three months ago.  CSW asked [facility] to check to see if they are open to patient.  She is checking to see if patient is open to [facility] or any other agency.    3:30pm  CSW received message from [place], they are currently seeing patient for [facility] PT and OT.  CSW to request social work as well.  Expected Discharge Plan and [place] with home health orders via EMS.         Expected Discharge Date: 10/17/21                                     Social Determinants of Health  (SDOH) Interventions    Readmission Risk Interventions    05/21/2020    2:10 PM  Readmission Risk Prevention Plan  Transportation Screening Complete  PCP or Specialist Appt within 3-5 Days Complete  HRI or [place] Complete  Social Work Consult for [place] Planning/Counseling Complete  Medication Review Press photographer) Complete

## 2021-10-17 NOTE — TOC Transition Note (Addendum)
Transition of Care [facility]) - CM/SW Discharge Note   Patient Details  Name: [name] MRN: [id number] Date of Birth: 1928/11/07  Transition of Care [facility]) CM/SW Contact:  Ross Ludwig, LCSW Phone Number: 10/17/2021, 3:40 PM   Clinical Narrative:    Patient is currently open to [facility]. Patient has orders for home health through PT, OT, RN, Aide, and Soc Work.  CSW signing off please reconsult with any other social work needs, home health agency has been notified of planned discharge.  Per patient's daughter she will need EMS transport back home, CSW contacted EMS to arrange transportation, beside nurse is aware that patient is discharging today.    Final next level of care: Home/Self Care Barriers to Discharge: Barriers Resolved   Patient Goals and CMS Choice Patient states their goals for this hospitalization and ongoing recovery are:: To return back home with home health. CMS Medicare.gov Compare Post Acute Care list provided to:: Patient Represenative (must comment) Choice offered to / list presented to : Adult Children  Discharge Placement                  Name of family member notified: Daughter [name] Patient and family notified of of transfer: 10/17/21  Discharge Plan and Services                          HH Arranged: PT, OT, RN, Nurse's Aide, Social Work [employer] Agency: [place] Date [facility] Agency Contacted: 10/17/21 Time [place]: 1539 Representative spoke with at [place]: [place] Interventions     Readmission Risk Interventions    05/21/2020    2:10 PM  Readmission Risk Prevention Plan  Transportation Screening Complete  PCP or Specialist Appt within 3-5 Days Complete  HRI or [place] Complete  Social Work Consult for [place] Planning/Counseling Complete  Medication Review Press photographer) Complete

## 2021-10-19 LAB — URINE CULTURE: Culture: >=100000 — AB

## 2021-10-21 LAB — CULTURE, BLOOD (ROUTINE X 2)
Culture: NO GROWTH
Culture: NO GROWTH
Special Requests: ADEQUATE
Special Requests: ADEQUATE

## 2021-10-22 DIAGNOSIS — I5033 Acute on chronic diastolic (congestive) heart failure: Secondary | ICD-10-CM | POA: Diagnosis not present

## 2021-10-22 DIAGNOSIS — I5084 End stage heart failure: Secondary | ICD-10-CM | POA: Diagnosis not present

## 2021-10-22 DIAGNOSIS — G309 Alzheimer's disease, unspecified: Secondary | ICD-10-CM | POA: Diagnosis not present

## 2021-10-22 DIAGNOSIS — R532 Functional quadriplegia: Secondary | ICD-10-CM | POA: Diagnosis not present

## 2021-10-22 DIAGNOSIS — J449 Chronic obstructive pulmonary disease, unspecified: Secondary | ICD-10-CM | POA: Diagnosis not present

## 2021-10-23 DIAGNOSIS — J449 Chronic obstructive pulmonary disease, unspecified: Secondary | ICD-10-CM | POA: Diagnosis not present

## 2021-10-23 DIAGNOSIS — R918 Other nonspecific abnormal finding of lung field: Secondary | ICD-10-CM | POA: Diagnosis not present

## 2021-10-23 DIAGNOSIS — G3184 Mild cognitive impairment, so stated: Secondary | ICD-10-CM | POA: Diagnosis not present

## 2021-10-23 DIAGNOSIS — Z8543 Personal history of malignant neoplasm of ovary: Secondary | ICD-10-CM | POA: Diagnosis not present

## 2021-10-23 DIAGNOSIS — I5032 Chronic diastolic (congestive) heart failure: Secondary | ICD-10-CM | POA: Diagnosis not present

## 2021-10-23 DIAGNOSIS — Z950 Presence of cardiac pacemaker: Secondary | ICD-10-CM | POA: Diagnosis not present

## 2021-10-23 DIAGNOSIS — Z95810 Presence of automatic (implantable) cardiac defibrillator: Secondary | ICD-10-CM | POA: Diagnosis not present

## 2021-10-23 DIAGNOSIS — E559 Vitamin D deficiency, unspecified: Secondary | ICD-10-CM | POA: Diagnosis not present

## 2021-10-23 DIAGNOSIS — M1990 Unspecified osteoarthritis, unspecified site: Secondary | ICD-10-CM | POA: Diagnosis not present

## 2021-10-23 DIAGNOSIS — E441 Mild protein-calorie malnutrition: Secondary | ICD-10-CM | POA: Diagnosis not present

## 2021-10-23 DIAGNOSIS — I2510 Atherosclerotic heart disease of native coronary artery without angina pectoris: Secondary | ICD-10-CM | POA: Diagnosis not present

## 2021-10-23 DIAGNOSIS — E039 Hypothyroidism, unspecified: Secondary | ICD-10-CM | POA: Diagnosis not present

## 2021-10-23 DIAGNOSIS — D649 Anemia, unspecified: Secondary | ICD-10-CM | POA: Diagnosis not present

## 2021-10-23 DIAGNOSIS — T451X5S Adverse effect of antineoplastic and immunosuppressive drugs, sequela: Secondary | ICD-10-CM | POA: Diagnosis not present

## 2021-10-23 DIAGNOSIS — Z9981 Dependence on supplemental oxygen: Secondary | ICD-10-CM | POA: Diagnosis not present

## 2021-10-23 DIAGNOSIS — I428 Other cardiomyopathies: Secondary | ICD-10-CM | POA: Diagnosis not present

## 2021-10-23 DIAGNOSIS — Z8701 Personal history of pneumonia (recurrent): Secondary | ICD-10-CM | POA: Diagnosis not present

## 2021-10-23 DIAGNOSIS — G620 Drug-induced polyneuropathy: Secondary | ICD-10-CM | POA: Diagnosis not present

## 2021-10-24 DIAGNOSIS — Z95810 Presence of automatic (implantable) cardiac defibrillator: Secondary | ICD-10-CM | POA: Diagnosis not present

## 2021-10-24 DIAGNOSIS — I2510 Atherosclerotic heart disease of native coronary artery without angina pectoris: Secondary | ICD-10-CM | POA: Diagnosis not present

## 2021-10-24 DIAGNOSIS — I428 Other cardiomyopathies: Secondary | ICD-10-CM | POA: Diagnosis not present

## 2021-10-24 DIAGNOSIS — J449 Chronic obstructive pulmonary disease, unspecified: Secondary | ICD-10-CM | POA: Diagnosis not present

## 2021-10-24 DIAGNOSIS — Z9981 Dependence on supplemental oxygen: Secondary | ICD-10-CM | POA: Diagnosis not present

## 2021-10-24 DIAGNOSIS — I5032 Chronic diastolic (congestive) heart failure: Secondary | ICD-10-CM | POA: Diagnosis not present

## 2021-10-26 DIAGNOSIS — I428 Other cardiomyopathies: Secondary | ICD-10-CM | POA: Diagnosis not present

## 2021-10-26 DIAGNOSIS — I2510 Atherosclerotic heart disease of native coronary artery without angina pectoris: Secondary | ICD-10-CM | POA: Diagnosis not present

## 2021-10-26 DIAGNOSIS — I5032 Chronic diastolic (congestive) heart failure: Secondary | ICD-10-CM | POA: Diagnosis not present

## 2021-10-26 DIAGNOSIS — Z9981 Dependence on supplemental oxygen: Secondary | ICD-10-CM | POA: Diagnosis not present

## 2021-10-26 DIAGNOSIS — Z95810 Presence of automatic (implantable) cardiac defibrillator: Secondary | ICD-10-CM | POA: Diagnosis not present

## 2021-10-26 DIAGNOSIS — J449 Chronic obstructive pulmonary disease, unspecified: Secondary | ICD-10-CM | POA: Diagnosis not present

## 2021-10-27 ENCOUNTER — Other Ambulatory Visit (HOSPITAL_COMMUNITY): Payer: Self-pay

## 2021-10-27 DIAGNOSIS — Z8543 Personal history of malignant neoplasm of ovary: Secondary | ICD-10-CM | POA: Diagnosis not present

## 2021-10-27 DIAGNOSIS — Z9981 Dependence on supplemental oxygen: Secondary | ICD-10-CM | POA: Diagnosis not present

## 2021-10-27 DIAGNOSIS — E039 Hypothyroidism, unspecified: Secondary | ICD-10-CM | POA: Diagnosis not present

## 2021-10-27 DIAGNOSIS — R918 Other nonspecific abnormal finding of lung field: Secondary | ICD-10-CM | POA: Diagnosis not present

## 2021-10-27 DIAGNOSIS — G3184 Mild cognitive impairment, so stated: Secondary | ICD-10-CM | POA: Diagnosis not present

## 2021-10-27 DIAGNOSIS — I428 Other cardiomyopathies: Secondary | ICD-10-CM | POA: Diagnosis not present

## 2021-10-27 DIAGNOSIS — G620 Drug-induced polyneuropathy: Secondary | ICD-10-CM | POA: Diagnosis not present

## 2021-10-27 DIAGNOSIS — Z8701 Personal history of pneumonia (recurrent): Secondary | ICD-10-CM | POA: Diagnosis not present

## 2021-10-27 DIAGNOSIS — J449 Chronic obstructive pulmonary disease, unspecified: Secondary | ICD-10-CM | POA: Diagnosis not present

## 2021-10-27 DIAGNOSIS — T451X5S Adverse effect of antineoplastic and immunosuppressive drugs, sequela: Secondary | ICD-10-CM | POA: Diagnosis not present

## 2021-10-27 DIAGNOSIS — E441 Mild protein-calorie malnutrition: Secondary | ICD-10-CM | POA: Diagnosis not present

## 2021-10-27 DIAGNOSIS — I5032 Chronic diastolic (congestive) heart failure: Secondary | ICD-10-CM | POA: Diagnosis not present

## 2021-10-27 DIAGNOSIS — Z950 Presence of cardiac pacemaker: Secondary | ICD-10-CM | POA: Diagnosis not present

## 2021-10-27 DIAGNOSIS — M1990 Unspecified osteoarthritis, unspecified site: Secondary | ICD-10-CM | POA: Diagnosis not present

## 2021-10-27 DIAGNOSIS — D649 Anemia, unspecified: Secondary | ICD-10-CM | POA: Diagnosis not present

## 2021-10-27 DIAGNOSIS — I2510 Atherosclerotic heart disease of native coronary artery without angina pectoris: Secondary | ICD-10-CM | POA: Diagnosis not present

## 2021-10-27 DIAGNOSIS — E559 Vitamin D deficiency, unspecified: Secondary | ICD-10-CM | POA: Diagnosis not present

## 2021-10-28 DIAGNOSIS — J449 Chronic obstructive pulmonary disease, unspecified: Secondary | ICD-10-CM | POA: Diagnosis not present

## 2021-10-28 DIAGNOSIS — Z950 Presence of cardiac pacemaker: Secondary | ICD-10-CM | POA: Diagnosis not present

## 2021-10-28 DIAGNOSIS — I5032 Chronic diastolic (congestive) heart failure: Secondary | ICD-10-CM | POA: Diagnosis not present

## 2021-10-28 DIAGNOSIS — I428 Other cardiomyopathies: Secondary | ICD-10-CM | POA: Diagnosis not present

## 2021-10-28 DIAGNOSIS — Z9981 Dependence on supplemental oxygen: Secondary | ICD-10-CM | POA: Diagnosis not present

## 2021-10-28 DIAGNOSIS — I2510 Atherosclerotic heart disease of native coronary artery without angina pectoris: Secondary | ICD-10-CM | POA: Diagnosis not present

## 2021-10-29 DIAGNOSIS — Z9981 Dependence on supplemental oxygen: Secondary | ICD-10-CM | POA: Diagnosis not present

## 2021-10-29 DIAGNOSIS — L24A2 Irritant contact dermatitis due to fecal, urinary or dual incontinence: Secondary | ICD-10-CM | POA: Diagnosis not present

## 2021-10-29 DIAGNOSIS — G309 Alzheimer's disease, unspecified: Secondary | ICD-10-CM | POA: Diagnosis not present

## 2021-10-29 DIAGNOSIS — J449 Chronic obstructive pulmonary disease, unspecified: Secondary | ICD-10-CM | POA: Diagnosis not present

## 2021-10-29 DIAGNOSIS — I5084 End stage heart failure: Secondary | ICD-10-CM | POA: Diagnosis not present

## 2021-10-30 DIAGNOSIS — I2510 Atherosclerotic heart disease of native coronary artery without angina pectoris: Secondary | ICD-10-CM | POA: Diagnosis not present

## 2021-10-30 DIAGNOSIS — Z9981 Dependence on supplemental oxygen: Secondary | ICD-10-CM | POA: Diagnosis not present

## 2021-10-30 DIAGNOSIS — J449 Chronic obstructive pulmonary disease, unspecified: Secondary | ICD-10-CM | POA: Diagnosis not present

## 2021-10-30 DIAGNOSIS — I5032 Chronic diastolic (congestive) heart failure: Secondary | ICD-10-CM | POA: Diagnosis not present

## 2021-10-30 DIAGNOSIS — I428 Other cardiomyopathies: Secondary | ICD-10-CM | POA: Diagnosis not present

## 2021-10-30 DIAGNOSIS — Z950 Presence of cardiac pacemaker: Secondary | ICD-10-CM | POA: Diagnosis not present

## 2021-11-02 DIAGNOSIS — I5032 Chronic diastolic (congestive) heart failure: Secondary | ICD-10-CM | POA: Diagnosis not present

## 2021-11-02 DIAGNOSIS — I428 Other cardiomyopathies: Secondary | ICD-10-CM | POA: Diagnosis not present

## 2021-11-02 DIAGNOSIS — Z9981 Dependence on supplemental oxygen: Secondary | ICD-10-CM | POA: Diagnosis not present

## 2021-11-02 DIAGNOSIS — Z950 Presence of cardiac pacemaker: Secondary | ICD-10-CM | POA: Diagnosis not present

## 2021-11-02 DIAGNOSIS — J449 Chronic obstructive pulmonary disease, unspecified: Secondary | ICD-10-CM | POA: Diagnosis not present

## 2021-11-02 DIAGNOSIS — I2510 Atherosclerotic heart disease of native coronary artery without angina pectoris: Secondary | ICD-10-CM | POA: Diagnosis not present

## 2021-11-03 DIAGNOSIS — J449 Chronic obstructive pulmonary disease, unspecified: Secondary | ICD-10-CM | POA: Diagnosis not present

## 2021-11-03 DIAGNOSIS — I2510 Atherosclerotic heart disease of native coronary artery without angina pectoris: Secondary | ICD-10-CM | POA: Diagnosis not present

## 2021-11-03 DIAGNOSIS — I5032 Chronic diastolic (congestive) heart failure: Secondary | ICD-10-CM | POA: Diagnosis not present

## 2021-11-03 DIAGNOSIS — Z9981 Dependence on supplemental oxygen: Secondary | ICD-10-CM | POA: Diagnosis not present

## 2021-11-03 DIAGNOSIS — I428 Other cardiomyopathies: Secondary | ICD-10-CM | POA: Diagnosis not present

## 2021-11-03 DIAGNOSIS — Z950 Presence of cardiac pacemaker: Secondary | ICD-10-CM | POA: Diagnosis not present

## 2021-11-04 ENCOUNTER — Ambulatory Visit (INDEPENDENT_AMBULATORY_CARE_PROVIDER_SITE_OTHER): Payer: Medicare Other

## 2021-11-04 DIAGNOSIS — I428 Other cardiomyopathies: Secondary | ICD-10-CM

## 2021-11-04 DIAGNOSIS — Z9981 Dependence on supplemental oxygen: Secondary | ICD-10-CM | POA: Diagnosis not present

## 2021-11-04 DIAGNOSIS — I2510 Atherosclerotic heart disease of native coronary artery without angina pectoris: Secondary | ICD-10-CM | POA: Diagnosis not present

## 2021-11-04 DIAGNOSIS — J449 Chronic obstructive pulmonary disease, unspecified: Secondary | ICD-10-CM | POA: Diagnosis not present

## 2021-11-04 DIAGNOSIS — Z950 Presence of cardiac pacemaker: Secondary | ICD-10-CM | POA: Diagnosis not present

## 2021-11-04 DIAGNOSIS — I5032 Chronic diastolic (congestive) heart failure: Secondary | ICD-10-CM | POA: Diagnosis not present

## 2021-11-05 DIAGNOSIS — I428 Other cardiomyopathies: Secondary | ICD-10-CM | POA: Diagnosis not present

## 2021-11-05 DIAGNOSIS — Z950 Presence of cardiac pacemaker: Secondary | ICD-10-CM | POA: Diagnosis not present

## 2021-11-05 DIAGNOSIS — I2510 Atherosclerotic heart disease of native coronary artery without angina pectoris: Secondary | ICD-10-CM | POA: Diagnosis not present

## 2021-11-05 DIAGNOSIS — Z9981 Dependence on supplemental oxygen: Secondary | ICD-10-CM | POA: Diagnosis not present

## 2021-11-05 DIAGNOSIS — I5032 Chronic diastolic (congestive) heart failure: Secondary | ICD-10-CM | POA: Diagnosis not present

## 2021-11-05 DIAGNOSIS — J449 Chronic obstructive pulmonary disease, unspecified: Secondary | ICD-10-CM | POA: Diagnosis not present

## 2021-11-06 DIAGNOSIS — Z950 Presence of cardiac pacemaker: Secondary | ICD-10-CM | POA: Diagnosis not present

## 2021-11-06 DIAGNOSIS — I428 Other cardiomyopathies: Secondary | ICD-10-CM | POA: Diagnosis not present

## 2021-11-06 DIAGNOSIS — I2510 Atherosclerotic heart disease of native coronary artery without angina pectoris: Secondary | ICD-10-CM | POA: Diagnosis not present

## 2021-11-06 DIAGNOSIS — J449 Chronic obstructive pulmonary disease, unspecified: Secondary | ICD-10-CM | POA: Diagnosis not present

## 2021-11-06 DIAGNOSIS — Z9981 Dependence on supplemental oxygen: Secondary | ICD-10-CM | POA: Diagnosis not present

## 2021-11-06 DIAGNOSIS — I5032 Chronic diastolic (congestive) heart failure: Secondary | ICD-10-CM | POA: Diagnosis not present

## 2021-11-09 DIAGNOSIS — I2510 Atherosclerotic heart disease of native coronary artery without angina pectoris: Secondary | ICD-10-CM | POA: Diagnosis not present

## 2021-11-09 DIAGNOSIS — Z9981 Dependence on supplemental oxygen: Secondary | ICD-10-CM | POA: Diagnosis not present

## 2021-11-09 DIAGNOSIS — I5032 Chronic diastolic (congestive) heart failure: Secondary | ICD-10-CM | POA: Diagnosis not present

## 2021-11-09 DIAGNOSIS — I428 Other cardiomyopathies: Secondary | ICD-10-CM | POA: Diagnosis not present

## 2021-11-09 DIAGNOSIS — J449 Chronic obstructive pulmonary disease, unspecified: Secondary | ICD-10-CM | POA: Diagnosis not present

## 2021-11-09 DIAGNOSIS — Z950 Presence of cardiac pacemaker: Secondary | ICD-10-CM | POA: Diagnosis not present

## 2021-11-11 DIAGNOSIS — Z9981 Dependence on supplemental oxygen: Secondary | ICD-10-CM | POA: Diagnosis not present

## 2021-11-11 DIAGNOSIS — Z950 Presence of cardiac pacemaker: Secondary | ICD-10-CM | POA: Diagnosis not present

## 2021-11-11 DIAGNOSIS — I2510 Atherosclerotic heart disease of native coronary artery without angina pectoris: Secondary | ICD-10-CM | POA: Diagnosis not present

## 2021-11-11 DIAGNOSIS — I5032 Chronic diastolic (congestive) heart failure: Secondary | ICD-10-CM | POA: Diagnosis not present

## 2021-11-11 DIAGNOSIS — J449 Chronic obstructive pulmonary disease, unspecified: Secondary | ICD-10-CM | POA: Diagnosis not present

## 2021-11-11 DIAGNOSIS — I428 Other cardiomyopathies: Secondary | ICD-10-CM | POA: Diagnosis not present

## 2021-11-12 DIAGNOSIS — Z9981 Dependence on supplemental oxygen: Secondary | ICD-10-CM | POA: Diagnosis not present

## 2021-11-12 DIAGNOSIS — I428 Other cardiomyopathies: Secondary | ICD-10-CM | POA: Diagnosis not present

## 2021-11-12 DIAGNOSIS — I2510 Atherosclerotic heart disease of native coronary artery without angina pectoris: Secondary | ICD-10-CM | POA: Diagnosis not present

## 2021-11-12 DIAGNOSIS — J449 Chronic obstructive pulmonary disease, unspecified: Secondary | ICD-10-CM | POA: Diagnosis not present

## 2021-11-12 DIAGNOSIS — Z950 Presence of cardiac pacemaker: Secondary | ICD-10-CM | POA: Diagnosis not present

## 2021-11-12 DIAGNOSIS — I5032 Chronic diastolic (congestive) heart failure: Secondary | ICD-10-CM | POA: Diagnosis not present

## 2021-11-12 LAB — CUP PACEART REMOTE DEVICE CHECK
Battery Remaining Longevity: 101 mo
Battery Voltage: 3 V
Brady Statistic AP VP Percent: 18.93 %
Brady Statistic AP VS Percent: 0.28 %
Brady Statistic AS VP Percent: 79.33 %
Brady Statistic AS VS Percent: 1.46 %
Brady Statistic RA Percent Paced: 19.25 %
Brady Statistic RV Percent Paced: 14.26 %
Date Time Interrogation Session: 20230810114413
Implantable Lead Implant Date: 20090331
Implantable Lead Implant Date: 20090331
Implantable Lead Implant Date: 20090401
Implantable Lead Location: 753858
Implantable Lead Location: 753859
Implantable Lead Location: 753860
Implantable Lead Model: 4194
Implantable Lead Model: 5076
Implantable Lead Model: 6947
Implantable Pulse Generator Implant Date: 20210614
Lead Channel Impedance Value: 285 Ohm
Lead Channel Impedance Value: 361 Ohm
Lead Channel Impedance Value: 380 Ohm
Lead Channel Impedance Value: 380 Ohm
Lead Channel Impedance Value: 418 Ohm
Lead Channel Impedance Value: 513 Ohm
Lead Channel Impedance Value: 551 Ohm
Lead Channel Impedance Value: 551 Ohm
Lead Channel Impedance Value: 684 Ohm
Lead Channel Pacing Threshold Amplitude: 0.875 V
Lead Channel Pacing Threshold Amplitude: 1.375 V
Lead Channel Pacing Threshold Amplitude: 1.5 V
Lead Channel Pacing Threshold Pulse Width: 0.4 ms
Lead Channel Pacing Threshold Pulse Width: 0.4 ms
Lead Channel Pacing Threshold Pulse Width: 0.8 ms
Lead Channel Sensing Intrinsic Amplitude: 16.625 mV
Lead Channel Sensing Intrinsic Amplitude: 16.625 mV
Lead Channel Sensing Intrinsic Amplitude: 3.125 mV
Lead Channel Sensing Intrinsic Amplitude: 3.125 mV
Lead Channel Setting Pacing Amplitude: 2 V
Lead Channel Setting Pacing Amplitude: 2.25 V
Lead Channel Setting Pacing Amplitude: 2.5 V
Lead Channel Setting Pacing Pulse Width: 0.8 ms
Lead Channel Setting Pacing Pulse Width: 0.8 ms
Lead Channel Setting Sensing Sensitivity: 1.2 mV

## 2021-11-13 DIAGNOSIS — I5032 Chronic diastolic (congestive) heart failure: Secondary | ICD-10-CM | POA: Diagnosis not present

## 2021-11-13 DIAGNOSIS — I428 Other cardiomyopathies: Secondary | ICD-10-CM | POA: Diagnosis not present

## 2021-11-13 DIAGNOSIS — Z9981 Dependence on supplemental oxygen: Secondary | ICD-10-CM | POA: Diagnosis not present

## 2021-11-13 DIAGNOSIS — Z950 Presence of cardiac pacemaker: Secondary | ICD-10-CM | POA: Diagnosis not present

## 2021-11-13 DIAGNOSIS — J449 Chronic obstructive pulmonary disease, unspecified: Secondary | ICD-10-CM | POA: Diagnosis not present

## 2021-11-13 DIAGNOSIS — I2510 Atherosclerotic heart disease of native coronary artery without angina pectoris: Secondary | ICD-10-CM | POA: Diagnosis not present

## 2021-11-16 DIAGNOSIS — I5032 Chronic diastolic (congestive) heart failure: Secondary | ICD-10-CM | POA: Diagnosis not present

## 2021-11-16 DIAGNOSIS — Z950 Presence of cardiac pacemaker: Secondary | ICD-10-CM | POA: Diagnosis not present

## 2021-11-16 DIAGNOSIS — Z9981 Dependence on supplemental oxygen: Secondary | ICD-10-CM | POA: Diagnosis not present

## 2021-11-16 DIAGNOSIS — I2510 Atherosclerotic heart disease of native coronary artery without angina pectoris: Secondary | ICD-10-CM | POA: Diagnosis not present

## 2021-11-16 DIAGNOSIS — J449 Chronic obstructive pulmonary disease, unspecified: Secondary | ICD-10-CM | POA: Diagnosis not present

## 2021-11-16 DIAGNOSIS — I428 Other cardiomyopathies: Secondary | ICD-10-CM | POA: Diagnosis not present

## 2021-11-19 DIAGNOSIS — J449 Chronic obstructive pulmonary disease, unspecified: Secondary | ICD-10-CM | POA: Diagnosis not present

## 2021-11-19 DIAGNOSIS — Z950 Presence of cardiac pacemaker: Secondary | ICD-10-CM | POA: Diagnosis not present

## 2021-11-19 DIAGNOSIS — I2510 Atherosclerotic heart disease of native coronary artery without angina pectoris: Secondary | ICD-10-CM | POA: Diagnosis not present

## 2021-11-19 DIAGNOSIS — I428 Other cardiomyopathies: Secondary | ICD-10-CM | POA: Diagnosis not present

## 2021-11-19 DIAGNOSIS — Z9981 Dependence on supplemental oxygen: Secondary | ICD-10-CM | POA: Diagnosis not present

## 2021-11-19 DIAGNOSIS — I5032 Chronic diastolic (congestive) heart failure: Secondary | ICD-10-CM | POA: Diagnosis not present

## 2021-11-27 DEATH — deceased

## 2021-12-08 NOTE — Progress Notes (Signed)
Remote pacemaker transmission.
